# Patient Record
Sex: Female | Born: 1965
Health system: Southern US, Community
[De-identification: ages and names within clinical notes are randomized; demographics above are authoritative.]

## PROBLEM LIST (undated history)

## (undated) DIAGNOSIS — M47816 Spondylosis without myelopathy or radiculopathy, lumbar region: Secondary | ICD-10-CM

## (undated) DIAGNOSIS — N979 Female infertility, unspecified: Secondary | ICD-10-CM

## (undated) DIAGNOSIS — M51369 Other intervertebral disc degeneration, lumbar region without mention of lumbar back pain or lower extremity pain: Secondary | ICD-10-CM

## (undated) DIAGNOSIS — H269 Unspecified cataract: Secondary | ICD-10-CM

## (undated) DIAGNOSIS — D649 Anemia, unspecified: Secondary | ICD-10-CM

## (undated) DIAGNOSIS — R1310 Dysphagia, unspecified: Secondary | ICD-10-CM

## (undated) DIAGNOSIS — F32A Depression, unspecified: Secondary | ICD-10-CM

## (undated) DIAGNOSIS — K5900 Constipation, unspecified: Secondary | ICD-10-CM

## (undated) DIAGNOSIS — E559 Vitamin D deficiency, unspecified: Secondary | ICD-10-CM

## (undated) DIAGNOSIS — M5430 Sciatica, unspecified side: Secondary | ICD-10-CM

## (undated) DIAGNOSIS — R600 Localized edema: Secondary | ICD-10-CM

## (undated) DIAGNOSIS — M3500 Sicca syndrome, unspecified: Secondary | ICD-10-CM

## (undated) DIAGNOSIS — R0602 Shortness of breath: Secondary | ICD-10-CM

## (undated) DIAGNOSIS — E785 Hyperlipidemia, unspecified: Secondary | ICD-10-CM

## (undated) DIAGNOSIS — E039 Hypothyroidism, unspecified: Secondary | ICD-10-CM

## (undated) DIAGNOSIS — M810 Age-related osteoporosis without current pathological fracture: Secondary | ICD-10-CM

## (undated) DIAGNOSIS — K760 Fatty (change of) liver, not elsewhere classified: Secondary | ICD-10-CM

## (undated) DIAGNOSIS — Z8052 Family history of malignant neoplasm of bladder: Secondary | ICD-10-CM

## (undated) DIAGNOSIS — M797 Fibromyalgia: Secondary | ICD-10-CM

## (undated) DIAGNOSIS — I10 Essential (primary) hypertension: Secondary | ICD-10-CM

## (undated) DIAGNOSIS — F329 Major depressive disorder, single episode, unspecified: Secondary | ICD-10-CM

## (undated) DIAGNOSIS — E739 Lactose intolerance, unspecified: Secondary | ICD-10-CM

## (undated) DIAGNOSIS — R079 Chest pain, unspecified: Secondary | ICD-10-CM

## (undated) DIAGNOSIS — G629 Polyneuropathy, unspecified: Secondary | ICD-10-CM

## (undated) DIAGNOSIS — M1990 Unspecified osteoarthritis, unspecified site: Secondary | ICD-10-CM

## (undated) DIAGNOSIS — G8929 Other chronic pain: Secondary | ICD-10-CM

## (undated) DIAGNOSIS — M549 Dorsalgia, unspecified: Secondary | ICD-10-CM

## (undated) DIAGNOSIS — K829 Disease of gallbladder, unspecified: Secondary | ICD-10-CM

## (undated) DIAGNOSIS — G709 Myoneural disorder, unspecified: Secondary | ICD-10-CM

## (undated) DIAGNOSIS — K589 Irritable bowel syndrome without diarrhea: Secondary | ICD-10-CM

## (undated) DIAGNOSIS — G9332 Myalgic encephalomyelitis/chronic fatigue syndrome: Secondary | ICD-10-CM

## (undated) DIAGNOSIS — F419 Anxiety disorder, unspecified: Secondary | ICD-10-CM

## (undated) DIAGNOSIS — M2550 Pain in unspecified joint: Secondary | ICD-10-CM

## (undated) DIAGNOSIS — R7303 Prediabetes: Secondary | ICD-10-CM

## (undated) DIAGNOSIS — J45909 Unspecified asthma, uncomplicated: Secondary | ICD-10-CM

## (undated) DIAGNOSIS — M5136 Other intervertebral disc degeneration, lumbar region: Secondary | ICD-10-CM

## (undated) DIAGNOSIS — E079 Disorder of thyroid, unspecified: Secondary | ICD-10-CM

## (undated) DIAGNOSIS — T7840XA Allergy, unspecified, initial encounter: Secondary | ICD-10-CM

## (undated) DIAGNOSIS — G459 Transient cerebral ischemic attack, unspecified: Secondary | ICD-10-CM

## (undated) DIAGNOSIS — K224 Dyskinesia of esophagus: Secondary | ICD-10-CM

## (undated) HISTORY — DX: Disorder of thyroid, unspecified: E07.9

## (undated) HISTORY — DX: Essential (primary) hypertension: I10

## (undated) HISTORY — DX: Myoneural disorder, unspecified: G70.9

## (undated) HISTORY — DX: Unspecified osteoarthritis, unspecified site: M19.90

## (undated) HISTORY — DX: Depression, unspecified: F32.A

## (undated) HISTORY — DX: Prediabetes: R73.03

## (undated) HISTORY — PX: NECK SURGERY: SHX720

## (undated) HISTORY — DX: Hyperlipidemia, unspecified: E78.5

## (undated) HISTORY — DX: Sciatica, unspecified side: M54.30

## (undated) HISTORY — DX: Family history of malignant neoplasm of bladder: Z80.52

## (undated) HISTORY — DX: Polyneuropathy, unspecified: G62.9

## (undated) HISTORY — DX: Transient cerebral ischemic attack, unspecified: G45.9

## (undated) HISTORY — DX: Other intervertebral disc degeneration, lumbar region without mention of lumbar back pain or lower extremity pain: M51.369

## (undated) HISTORY — DX: Allergy, unspecified, initial encounter: T78.40XA

## (undated) HISTORY — DX: Hypothyroidism, unspecified: E03.9

## (undated) HISTORY — DX: Pain in unspecified joint: M25.50

## (undated) HISTORY — DX: Fibromyalgia: M79.7

## (undated) HISTORY — PX: APPENDECTOMY: SHX54

## (undated) HISTORY — DX: Anxiety disorder, unspecified: F41.9

## (undated) HISTORY — DX: Disease of gallbladder, unspecified: K82.9

## (undated) HISTORY — DX: Unspecified asthma, uncomplicated: J45.909

## (undated) HISTORY — DX: Unspecified cataract: H26.9

## (undated) HISTORY — DX: Vitamin D deficiency, unspecified: E55.9

## (undated) HISTORY — DX: Other intervertebral disc degeneration, lumbar region: M51.36

## (undated) HISTORY — DX: Dysphagia, unspecified: R13.10

## (undated) HISTORY — DX: Other chronic pain: G89.29

## (undated) HISTORY — PX: TUBAL LIGATION: SHX77

## (undated) HISTORY — DX: Dorsalgia, unspecified: M54.9

## (undated) HISTORY — DX: Age-related osteoporosis without current pathological fracture: M81.0

## (undated) HISTORY — DX: Dyskinesia of esophagus: K22.4

## (undated) HISTORY — DX: Anemia, unspecified: D64.9

## (undated) HISTORY — DX: Myalgic encephalomyelitis/chronic fatigue syndrome: G93.32

## (undated) HISTORY — PX: SPINE SURGERY: SHX786

## (undated) HISTORY — PX: CHOLECYSTECTOMY: SHX55

## (undated) HISTORY — DX: Female infertility, unspecified: N97.9

## (undated) HISTORY — DX: Constipation, unspecified: K59.00

## (undated) HISTORY — DX: Fatty (change of) liver, not elsewhere classified: K76.0

## (undated) HISTORY — PX: TOTAL ABDOMINAL HYSTERECTOMY: SHX209

## (undated) HISTORY — DX: Localized edema: R60.0

## (undated) HISTORY — PX: ANKLE SURGERY: SHX546

## (undated) HISTORY — DX: Shortness of breath: R06.02

## (undated) HISTORY — DX: Chest pain, unspecified: R07.9

## (undated) HISTORY — DX: Irritable bowel syndrome, unspecified: K58.9

## (undated) HISTORY — PX: OTHER SURGICAL HISTORY: SHX169

## (undated) HISTORY — PX: ABDOMINAL HYSTERECTOMY: SHX81

## (undated) HISTORY — DX: Spondylosis without myelopathy or radiculopathy, lumbar region: M47.816

## (undated) HISTORY — DX: Lactose intolerance, unspecified: E73.9

---
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
## 2013-07-11 DIAGNOSIS — G629 Polyneuropathy, unspecified: Secondary | ICD-10-CM | POA: Insufficient documentation

## 2015-08-20 DIAGNOSIS — M47817 Spondylosis without myelopathy or radiculopathy, lumbosacral region: Secondary | ICD-10-CM | POA: Insufficient documentation

## 2017-04-01 DIAGNOSIS — M6281 Muscle weakness (generalized): Secondary | ICD-10-CM | POA: Diagnosis not present

## 2017-04-11 DIAGNOSIS — G44229 Chronic tension-type headache, not intractable: Secondary | ICD-10-CM | POA: Diagnosis not present

## 2017-04-11 DIAGNOSIS — I739 Peripheral vascular disease, unspecified: Secondary | ICD-10-CM | POA: Diagnosis not present

## 2017-05-04 DIAGNOSIS — J029 Acute pharyngitis, unspecified: Secondary | ICD-10-CM | POA: Diagnosis not present

## 2017-05-04 DIAGNOSIS — J069 Acute upper respiratory infection, unspecified: Secondary | ICD-10-CM | POA: Diagnosis not present

## 2017-05-25 DIAGNOSIS — M5136 Other intervertebral disc degeneration, lumbar region: Secondary | ICD-10-CM | POA: Diagnosis not present

## 2017-05-25 DIAGNOSIS — M4802 Spinal stenosis, cervical region: Secondary | ICD-10-CM | POA: Diagnosis not present

## 2017-05-25 DIAGNOSIS — Z8249 Family history of ischemic heart disease and other diseases of the circulatory system: Secondary | ICD-10-CM | POA: Diagnosis not present

## 2017-05-25 DIAGNOSIS — M961 Postlaminectomy syndrome, not elsewhere classified: Secondary | ICD-10-CM | POA: Diagnosis not present

## 2017-05-25 DIAGNOSIS — M791 Myalgia: Secondary | ICD-10-CM | POA: Diagnosis not present

## 2017-05-25 DIAGNOSIS — Z79891 Long term (current) use of opiate analgesic: Secondary | ICD-10-CM | POA: Diagnosis not present

## 2017-05-25 DIAGNOSIS — M461 Sacroiliitis, not elsewhere classified: Secondary | ICD-10-CM | POA: Diagnosis not present

## 2017-05-25 DIAGNOSIS — Z885 Allergy status to narcotic agent status: Secondary | ICD-10-CM | POA: Diagnosis not present

## 2017-05-25 DIAGNOSIS — M533 Sacrococcygeal disorders, not elsewhere classified: Secondary | ICD-10-CM | POA: Diagnosis not present

## 2017-05-25 DIAGNOSIS — M47896 Other spondylosis, lumbar region: Secondary | ICD-10-CM | POA: Diagnosis not present

## 2017-05-25 DIAGNOSIS — M5010 Cervical disc disorder with radiculopathy, unspecified cervical region: Secondary | ICD-10-CM | POA: Diagnosis not present

## 2017-05-25 DIAGNOSIS — M47892 Other spondylosis, cervical region: Secondary | ICD-10-CM | POA: Diagnosis not present

## 2017-06-22 DIAGNOSIS — J069 Acute upper respiratory infection, unspecified: Secondary | ICD-10-CM | POA: Diagnosis not present

## 2017-06-22 DIAGNOSIS — R05 Cough: Secondary | ICD-10-CM | POA: Diagnosis not present

## 2017-06-22 DIAGNOSIS — R062 Wheezing: Secondary | ICD-10-CM | POA: Diagnosis not present

## 2017-07-20 DIAGNOSIS — M961 Postlaminectomy syndrome, not elsewhere classified: Secondary | ICD-10-CM | POA: Diagnosis not present

## 2017-07-20 DIAGNOSIS — M7918 Myalgia, other site: Secondary | ICD-10-CM | POA: Diagnosis not present

## 2017-07-20 DIAGNOSIS — M461 Sacroiliitis, not elsewhere classified: Secondary | ICD-10-CM | POA: Diagnosis not present

## 2017-07-20 DIAGNOSIS — M5030 Other cervical disc degeneration, unspecified cervical region: Secondary | ICD-10-CM | POA: Diagnosis not present

## 2017-07-20 DIAGNOSIS — M47816 Spondylosis without myelopathy or radiculopathy, lumbar region: Secondary | ICD-10-CM | POA: Diagnosis not present

## 2017-07-20 DIAGNOSIS — G8929 Other chronic pain: Secondary | ICD-10-CM | POA: Diagnosis not present

## 2017-07-20 DIAGNOSIS — M47812 Spondylosis without myelopathy or radiculopathy, cervical region: Secondary | ICD-10-CM | POA: Diagnosis not present

## 2017-07-20 DIAGNOSIS — M4802 Spinal stenosis, cervical region: Secondary | ICD-10-CM | POA: Diagnosis not present

## 2017-07-20 DIAGNOSIS — M5136 Other intervertebral disc degeneration, lumbar region: Secondary | ICD-10-CM | POA: Diagnosis not present

## 2017-07-20 DIAGNOSIS — Z79891 Long term (current) use of opiate analgesic: Secondary | ICD-10-CM | POA: Diagnosis not present

## 2017-07-20 DIAGNOSIS — Z6841 Body Mass Index (BMI) 40.0 and over, adult: Secondary | ICD-10-CM | POA: Diagnosis not present

## 2017-07-20 DIAGNOSIS — Z981 Arthrodesis status: Secondary | ICD-10-CM | POA: Diagnosis not present

## 2017-07-20 DIAGNOSIS — M533 Sacrococcygeal disorders, not elsewhere classified: Secondary | ICD-10-CM | POA: Diagnosis not present

## 2017-08-16 DIAGNOSIS — M549 Dorsalgia, unspecified: Secondary | ICD-10-CM | POA: Diagnosis not present

## 2017-08-23 DIAGNOSIS — H109 Unspecified conjunctivitis: Secondary | ICD-10-CM | POA: Diagnosis not present

## 2017-08-25 DIAGNOSIS — Z981 Arthrodesis status: Secondary | ICD-10-CM | POA: Diagnosis not present

## 2017-08-25 DIAGNOSIS — M461 Sacroiliitis, not elsewhere classified: Secondary | ICD-10-CM | POA: Diagnosis not present

## 2017-08-25 DIAGNOSIS — Z79899 Other long term (current) drug therapy: Secondary | ICD-10-CM | POA: Diagnosis not present

## 2017-08-25 DIAGNOSIS — M47816 Spondylosis without myelopathy or radiculopathy, lumbar region: Secondary | ICD-10-CM | POA: Diagnosis not present

## 2017-08-25 DIAGNOSIS — Z885 Allergy status to narcotic agent status: Secondary | ICD-10-CM | POA: Diagnosis not present

## 2017-08-25 DIAGNOSIS — M4802 Spinal stenosis, cervical region: Secondary | ICD-10-CM | POA: Diagnosis not present

## 2017-08-25 DIAGNOSIS — M5010 Cervical disc disorder with radiculopathy, unspecified cervical region: Secondary | ICD-10-CM | POA: Diagnosis not present

## 2017-08-25 DIAGNOSIS — M533 Sacrococcygeal disorders, not elsewhere classified: Secondary | ICD-10-CM | POA: Diagnosis not present

## 2017-08-25 DIAGNOSIS — M961 Postlaminectomy syndrome, not elsewhere classified: Secondary | ICD-10-CM | POA: Diagnosis not present

## 2017-09-08 DIAGNOSIS — M797 Fibromyalgia: Secondary | ICD-10-CM | POA: Diagnosis not present

## 2017-09-29 DIAGNOSIS — M533 Sacrococcygeal disorders, not elsewhere classified: Secondary | ICD-10-CM | POA: Diagnosis not present

## 2017-09-29 DIAGNOSIS — M961 Postlaminectomy syndrome, not elsewhere classified: Secondary | ICD-10-CM | POA: Diagnosis not present

## 2017-09-29 DIAGNOSIS — M5136 Other intervertebral disc degeneration, lumbar region: Secondary | ICD-10-CM | POA: Diagnosis not present

## 2017-09-29 DIAGNOSIS — M5030 Other cervical disc degeneration, unspecified cervical region: Secondary | ICD-10-CM | POA: Diagnosis not present

## 2017-09-29 DIAGNOSIS — Z79891 Long term (current) use of opiate analgesic: Secondary | ICD-10-CM | POA: Diagnosis not present

## 2017-09-29 DIAGNOSIS — M47816 Spondylosis without myelopathy or radiculopathy, lumbar region: Secondary | ICD-10-CM | POA: Diagnosis not present

## 2017-09-29 DIAGNOSIS — M461 Sacroiliitis, not elsewhere classified: Secondary | ICD-10-CM | POA: Diagnosis not present

## 2017-09-29 DIAGNOSIS — Z6841 Body Mass Index (BMI) 40.0 and over, adult: Secondary | ICD-10-CM | POA: Diagnosis not present

## 2017-09-29 DIAGNOSIS — M7918 Myalgia, other site: Secondary | ICD-10-CM | POA: Diagnosis not present

## 2017-09-29 DIAGNOSIS — G8929 Other chronic pain: Secondary | ICD-10-CM | POA: Diagnosis not present

## 2017-09-29 DIAGNOSIS — M4802 Spinal stenosis, cervical region: Secondary | ICD-10-CM | POA: Diagnosis not present

## 2017-09-29 DIAGNOSIS — M170 Bilateral primary osteoarthritis of knee: Secondary | ICD-10-CM | POA: Diagnosis not present

## 2017-09-29 DIAGNOSIS — M47812 Spondylosis without myelopathy or radiculopathy, cervical region: Secondary | ICD-10-CM | POA: Diagnosis not present

## 2017-09-29 DIAGNOSIS — Z981 Arthrodesis status: Secondary | ICD-10-CM | POA: Diagnosis not present

## 2017-10-18 DIAGNOSIS — R5383 Other fatigue: Secondary | ICD-10-CM | POA: Diagnosis not present

## 2017-12-08 DIAGNOSIS — G8929 Other chronic pain: Secondary | ICD-10-CM | POA: Diagnosis not present

## 2017-12-08 DIAGNOSIS — M961 Postlaminectomy syndrome, not elsewhere classified: Secondary | ICD-10-CM | POA: Diagnosis not present

## 2017-12-08 DIAGNOSIS — M461 Sacroiliitis, not elsewhere classified: Secondary | ICD-10-CM | POA: Diagnosis not present

## 2017-12-08 DIAGNOSIS — M170 Bilateral primary osteoarthritis of knee: Secondary | ICD-10-CM | POA: Diagnosis not present

## 2017-12-08 DIAGNOSIS — M5136 Other intervertebral disc degeneration, lumbar region: Secondary | ICD-10-CM | POA: Diagnosis not present

## 2017-12-08 DIAGNOSIS — G5710 Meralgia paresthetica, unspecified lower limb: Secondary | ICD-10-CM | POA: Diagnosis not present

## 2017-12-08 DIAGNOSIS — Z79891 Long term (current) use of opiate analgesic: Secondary | ICD-10-CM | POA: Diagnosis not present

## 2017-12-08 DIAGNOSIS — M7918 Myalgia, other site: Secondary | ICD-10-CM | POA: Diagnosis not present

## 2017-12-08 DIAGNOSIS — M5030 Other cervical disc degeneration, unspecified cervical region: Secondary | ICD-10-CM | POA: Diagnosis not present

## 2017-12-08 DIAGNOSIS — M4802 Spinal stenosis, cervical region: Secondary | ICD-10-CM | POA: Diagnosis not present

## 2017-12-08 DIAGNOSIS — M533 Sacrococcygeal disorders, not elsewhere classified: Secondary | ICD-10-CM | POA: Diagnosis not present

## 2017-12-08 DIAGNOSIS — M7912 Myalgia of auxiliary muscles, head and neck: Secondary | ICD-10-CM | POA: Diagnosis not present

## 2017-12-08 DIAGNOSIS — Z981 Arthrodesis status: Secondary | ICD-10-CM | POA: Diagnosis not present

## 2017-12-08 DIAGNOSIS — M47816 Spondylosis without myelopathy or radiculopathy, lumbar region: Secondary | ICD-10-CM | POA: Diagnosis not present

## 2017-12-08 DIAGNOSIS — M47812 Spondylosis without myelopathy or radiculopathy, cervical region: Secondary | ICD-10-CM | POA: Diagnosis not present

## 2017-12-21 DIAGNOSIS — M7061 Trochanteric bursitis, right hip: Secondary | ICD-10-CM | POA: Diagnosis not present

## 2017-12-21 DIAGNOSIS — M5030 Other cervical disc degeneration, unspecified cervical region: Secondary | ICD-10-CM | POA: Diagnosis not present

## 2017-12-21 DIAGNOSIS — Z79891 Long term (current) use of opiate analgesic: Secondary | ICD-10-CM | POA: Diagnosis not present

## 2017-12-21 DIAGNOSIS — Z981 Arthrodesis status: Secondary | ICD-10-CM | POA: Diagnosis not present

## 2017-12-21 DIAGNOSIS — M542 Cervicalgia: Secondary | ICD-10-CM | POA: Diagnosis not present

## 2017-12-21 DIAGNOSIS — Z79899 Other long term (current) drug therapy: Secondary | ICD-10-CM | POA: Diagnosis not present

## 2017-12-21 DIAGNOSIS — G5710 Meralgia paresthetica, unspecified lower limb: Secondary | ICD-10-CM | POA: Diagnosis not present

## 2017-12-21 DIAGNOSIS — G5711 Meralgia paresthetica, right lower limb: Secondary | ICD-10-CM | POA: Diagnosis not present

## 2017-12-21 DIAGNOSIS — M170 Bilateral primary osteoarthritis of knee: Secondary | ICD-10-CM | POA: Diagnosis not present

## 2017-12-21 DIAGNOSIS — M5136 Other intervertebral disc degeneration, lumbar region: Secondary | ICD-10-CM | POA: Diagnosis not present

## 2017-12-21 DIAGNOSIS — Z6841 Body Mass Index (BMI) 40.0 and over, adult: Secondary | ICD-10-CM | POA: Diagnosis not present

## 2017-12-28 DIAGNOSIS — G3184 Mild cognitive impairment, so stated: Secondary | ICD-10-CM | POA: Diagnosis not present

## 2017-12-28 DIAGNOSIS — M79606 Pain in leg, unspecified: Secondary | ICD-10-CM | POA: Diagnosis not present

## 2018-01-02 DIAGNOSIS — E039 Hypothyroidism, unspecified: Secondary | ICD-10-CM | POA: Diagnosis not present

## 2018-01-30 DIAGNOSIS — J111 Influenza due to unidentified influenza virus with other respiratory manifestations: Secondary | ICD-10-CM | POA: Diagnosis not present

## 2018-01-30 DIAGNOSIS — J029 Acute pharyngitis, unspecified: Secondary | ICD-10-CM | POA: Diagnosis not present

## 2018-02-22 DIAGNOSIS — M79651 Pain in right thigh: Secondary | ICD-10-CM | POA: Diagnosis not present

## 2018-02-22 DIAGNOSIS — M792 Neuralgia and neuritis, unspecified: Secondary | ICD-10-CM | POA: Diagnosis not present

## 2018-02-22 DIAGNOSIS — Z79899 Other long term (current) drug therapy: Secondary | ICD-10-CM | POA: Diagnosis not present

## 2018-02-22 DIAGNOSIS — Z79891 Long term (current) use of opiate analgesic: Secondary | ICD-10-CM | POA: Diagnosis not present

## 2018-02-22 DIAGNOSIS — M25561 Pain in right knee: Secondary | ICD-10-CM | POA: Diagnosis not present

## 2018-02-27 DIAGNOSIS — J029 Acute pharyngitis, unspecified: Secondary | ICD-10-CM | POA: Diagnosis not present

## 2018-03-22 DIAGNOSIS — M5010 Cervical disc disorder with radiculopathy, unspecified cervical region: Secondary | ICD-10-CM | POA: Diagnosis not present

## 2018-03-22 DIAGNOSIS — M47816 Spondylosis without myelopathy or radiculopathy, lumbar region: Secondary | ICD-10-CM | POA: Diagnosis not present

## 2018-03-22 DIAGNOSIS — M961 Postlaminectomy syndrome, not elsewhere classified: Secondary | ICD-10-CM | POA: Diagnosis not present

## 2018-03-22 DIAGNOSIS — Z981 Arthrodesis status: Secondary | ICD-10-CM | POA: Diagnosis not present

## 2018-03-22 DIAGNOSIS — M792 Neuralgia and neuritis, unspecified: Secondary | ICD-10-CM | POA: Diagnosis not present

## 2018-03-22 DIAGNOSIS — G5711 Meralgia paresthetica, right lower limb: Secondary | ICD-10-CM | POA: Diagnosis not present

## 2018-03-22 DIAGNOSIS — Z79891 Long term (current) use of opiate analgesic: Secondary | ICD-10-CM | POA: Diagnosis not present

## 2018-03-22 DIAGNOSIS — M461 Sacroiliitis, not elsewhere classified: Secondary | ICD-10-CM | POA: Diagnosis not present

## 2018-03-22 DIAGNOSIS — M4802 Spinal stenosis, cervical region: Secondary | ICD-10-CM | POA: Diagnosis not present

## 2018-03-22 DIAGNOSIS — M533 Sacrococcygeal disorders, not elsewhere classified: Secondary | ICD-10-CM | POA: Diagnosis not present

## 2018-04-03 DIAGNOSIS — R946 Abnormal results of thyroid function studies: Secondary | ICD-10-CM | POA: Diagnosis not present

## 2018-04-03 DIAGNOSIS — E039 Hypothyroidism, unspecified: Secondary | ICD-10-CM | POA: Diagnosis not present

## 2018-04-19 DIAGNOSIS — M792 Neuralgia and neuritis, unspecified: Secondary | ICD-10-CM | POA: Diagnosis not present

## 2018-04-19 DIAGNOSIS — M533 Sacrococcygeal disorders, not elsewhere classified: Secondary | ICD-10-CM | POA: Diagnosis not present

## 2018-04-19 DIAGNOSIS — M542 Cervicalgia: Secondary | ICD-10-CM | POA: Diagnosis not present

## 2018-04-19 DIAGNOSIS — Z79891 Long term (current) use of opiate analgesic: Secondary | ICD-10-CM | POA: Diagnosis not present

## 2018-04-19 DIAGNOSIS — M5030 Other cervical disc degeneration, unspecified cervical region: Secondary | ICD-10-CM | POA: Diagnosis not present

## 2018-04-19 DIAGNOSIS — M47816 Spondylosis without myelopathy or radiculopathy, lumbar region: Secondary | ICD-10-CM | POA: Diagnosis not present

## 2018-04-19 DIAGNOSIS — M4802 Spinal stenosis, cervical region: Secondary | ICD-10-CM | POA: Diagnosis not present

## 2018-04-19 DIAGNOSIS — M5136 Other intervertebral disc degeneration, lumbar region: Secondary | ICD-10-CM | POA: Diagnosis not present

## 2018-04-19 DIAGNOSIS — M961 Postlaminectomy syndrome, not elsewhere classified: Secondary | ICD-10-CM | POA: Diagnosis not present

## 2018-04-19 DIAGNOSIS — G5720 Lesion of femoral nerve, unspecified lower limb: Secondary | ICD-10-CM | POA: Diagnosis not present

## 2018-04-19 DIAGNOSIS — M7918 Myalgia, other site: Secondary | ICD-10-CM | POA: Diagnosis not present

## 2018-04-19 DIAGNOSIS — M47892 Other spondylosis, cervical region: Secondary | ICD-10-CM | POA: Diagnosis not present

## 2018-04-19 DIAGNOSIS — Z5181 Encounter for therapeutic drug level monitoring: Secondary | ICD-10-CM | POA: Diagnosis not present

## 2018-04-19 DIAGNOSIS — M47812 Spondylosis without myelopathy or radiculopathy, cervical region: Secondary | ICD-10-CM | POA: Diagnosis not present

## 2018-04-19 DIAGNOSIS — M461 Sacroiliitis, not elsewhere classified: Secondary | ICD-10-CM | POA: Diagnosis not present

## 2018-04-27 DIAGNOSIS — M79671 Pain in right foot: Secondary | ICD-10-CM | POA: Diagnosis not present

## 2018-05-04 DIAGNOSIS — M1711 Unilateral primary osteoarthritis, right knee: Secondary | ICD-10-CM | POA: Diagnosis not present

## 2018-05-04 DIAGNOSIS — M47816 Spondylosis without myelopathy or radiculopathy, lumbar region: Secondary | ICD-10-CM | POA: Diagnosis not present

## 2018-05-04 DIAGNOSIS — Z79891 Long term (current) use of opiate analgesic: Secondary | ICD-10-CM | POA: Diagnosis not present

## 2018-05-04 DIAGNOSIS — M5030 Other cervical disc degeneration, unspecified cervical region: Secondary | ICD-10-CM | POA: Diagnosis not present

## 2018-05-04 DIAGNOSIS — M47812 Spondylosis without myelopathy or radiculopathy, cervical region: Secondary | ICD-10-CM | POA: Diagnosis not present

## 2018-05-04 DIAGNOSIS — M25561 Pain in right knee: Secondary | ICD-10-CM | POA: Diagnosis not present

## 2018-05-04 DIAGNOSIS — M5136 Other intervertebral disc degeneration, lumbar region: Secondary | ICD-10-CM | POA: Diagnosis not present

## 2018-05-04 DIAGNOSIS — M461 Sacroiliitis, not elsewhere classified: Secondary | ICD-10-CM | POA: Diagnosis not present

## 2018-05-04 DIAGNOSIS — M961 Postlaminectomy syndrome, not elsewhere classified: Secondary | ICD-10-CM | POA: Diagnosis not present

## 2018-05-04 DIAGNOSIS — M542 Cervicalgia: Secondary | ICD-10-CM | POA: Diagnosis not present

## 2018-05-04 DIAGNOSIS — M533 Sacrococcygeal disorders, not elsewhere classified: Secondary | ICD-10-CM | POA: Diagnosis not present

## 2018-05-04 DIAGNOSIS — M5010 Cervical disc disorder with radiculopathy, unspecified cervical region: Secondary | ICD-10-CM | POA: Diagnosis not present

## 2018-05-04 DIAGNOSIS — M4802 Spinal stenosis, cervical region: Secondary | ICD-10-CM | POA: Diagnosis not present

## 2018-05-04 DIAGNOSIS — Z981 Arthrodesis status: Secondary | ICD-10-CM | POA: Diagnosis not present

## 2018-07-03 DIAGNOSIS — R300 Dysuria: Secondary | ICD-10-CM | POA: Diagnosis not present

## 2018-07-03 DIAGNOSIS — J019 Acute sinusitis, unspecified: Secondary | ICD-10-CM | POA: Diagnosis not present

## 2018-07-20 DIAGNOSIS — Z79891 Long term (current) use of opiate analgesic: Secondary | ICD-10-CM | POA: Diagnosis not present

## 2018-07-20 DIAGNOSIS — G5710 Meralgia paresthetica, unspecified lower limb: Secondary | ICD-10-CM | POA: Diagnosis not present

## 2018-07-20 DIAGNOSIS — M5136 Other intervertebral disc degeneration, lumbar region: Secondary | ICD-10-CM | POA: Diagnosis not present

## 2018-07-20 DIAGNOSIS — M47816 Spondylosis without myelopathy or radiculopathy, lumbar region: Secondary | ICD-10-CM | POA: Diagnosis not present

## 2018-07-20 DIAGNOSIS — M5030 Other cervical disc degeneration, unspecified cervical region: Secondary | ICD-10-CM | POA: Diagnosis not present

## 2018-07-20 DIAGNOSIS — M4802 Spinal stenosis, cervical region: Secondary | ICD-10-CM | POA: Diagnosis not present

## 2018-07-20 DIAGNOSIS — M7918 Myalgia, other site: Secondary | ICD-10-CM | POA: Diagnosis not present

## 2018-07-20 DIAGNOSIS — M542 Cervicalgia: Secondary | ICD-10-CM | POA: Diagnosis not present

## 2018-07-20 DIAGNOSIS — G8929 Other chronic pain: Secondary | ICD-10-CM | POA: Diagnosis not present

## 2018-07-20 DIAGNOSIS — M533 Sacrococcygeal disorders, not elsewhere classified: Secondary | ICD-10-CM | POA: Diagnosis not present

## 2018-07-20 DIAGNOSIS — M47812 Spondylosis without myelopathy or radiculopathy, cervical region: Secondary | ICD-10-CM | POA: Diagnosis not present

## 2018-07-20 DIAGNOSIS — M961 Postlaminectomy syndrome, not elsewhere classified: Secondary | ICD-10-CM | POA: Diagnosis not present

## 2018-07-20 DIAGNOSIS — M545 Low back pain: Secondary | ICD-10-CM | POA: Diagnosis not present

## 2018-07-20 DIAGNOSIS — M461 Sacroiliitis, not elsewhere classified: Secondary | ICD-10-CM | POA: Diagnosis not present

## 2018-07-20 DIAGNOSIS — Z981 Arthrodesis status: Secondary | ICD-10-CM | POA: Diagnosis not present

## 2018-09-05 DIAGNOSIS — R6889 Other general symptoms and signs: Secondary | ICD-10-CM | POA: Diagnosis not present

## 2018-09-05 DIAGNOSIS — M797 Fibromyalgia: Secondary | ICD-10-CM | POA: Diagnosis not present

## 2018-09-05 DIAGNOSIS — G3184 Mild cognitive impairment, so stated: Secondary | ICD-10-CM | POA: Diagnosis not present

## 2018-09-14 DIAGNOSIS — M961 Postlaminectomy syndrome, not elsewhere classified: Secondary | ICD-10-CM | POA: Diagnosis not present

## 2018-09-14 DIAGNOSIS — M533 Sacrococcygeal disorders, not elsewhere classified: Secondary | ICD-10-CM | POA: Diagnosis not present

## 2018-09-14 DIAGNOSIS — M7918 Myalgia, other site: Secondary | ICD-10-CM | POA: Diagnosis not present

## 2018-09-14 DIAGNOSIS — M5030 Other cervical disc degeneration, unspecified cervical region: Secondary | ICD-10-CM | POA: Diagnosis not present

## 2018-09-14 DIAGNOSIS — Z79891 Long term (current) use of opiate analgesic: Secondary | ICD-10-CM | POA: Diagnosis not present

## 2018-09-14 DIAGNOSIS — M5136 Other intervertebral disc degeneration, lumbar region: Secondary | ICD-10-CM | POA: Diagnosis not present

## 2018-09-14 DIAGNOSIS — M792 Neuralgia and neuritis, unspecified: Secondary | ICD-10-CM | POA: Diagnosis not present

## 2018-09-14 DIAGNOSIS — Z79899 Other long term (current) drug therapy: Secondary | ICD-10-CM | POA: Diagnosis not present

## 2018-09-14 DIAGNOSIS — M47816 Spondylosis without myelopathy or radiculopathy, lumbar region: Secondary | ICD-10-CM | POA: Diagnosis not present

## 2018-09-14 DIAGNOSIS — M461 Sacroiliitis, not elsewhere classified: Secondary | ICD-10-CM | POA: Diagnosis not present

## 2018-09-14 DIAGNOSIS — M4802 Spinal stenosis, cervical region: Secondary | ICD-10-CM | POA: Diagnosis not present

## 2018-09-14 DIAGNOSIS — M545 Low back pain: Secondary | ICD-10-CM | POA: Diagnosis not present

## 2018-09-14 DIAGNOSIS — M1711 Unilateral primary osteoarthritis, right knee: Secondary | ICD-10-CM | POA: Diagnosis not present

## 2018-09-14 DIAGNOSIS — Z6841 Body Mass Index (BMI) 40.0 and over, adult: Secondary | ICD-10-CM | POA: Diagnosis not present

## 2018-09-14 DIAGNOSIS — M47812 Spondylosis without myelopathy or radiculopathy, cervical region: Secondary | ICD-10-CM | POA: Diagnosis not present

## 2018-09-28 DIAGNOSIS — J019 Acute sinusitis, unspecified: Secondary | ICD-10-CM | POA: Diagnosis not present

## 2018-10-02 DIAGNOSIS — Z79899 Other long term (current) drug therapy: Secondary | ICD-10-CM | POA: Diagnosis not present

## 2018-10-02 DIAGNOSIS — M3501 Sicca syndrome with keratoconjunctivitis: Secondary | ICD-10-CM | POA: Diagnosis not present

## 2018-10-02 DIAGNOSIS — M150 Primary generalized (osteo)arthritis: Secondary | ICD-10-CM | POA: Diagnosis not present

## 2018-10-10 DIAGNOSIS — R531 Weakness: Secondary | ICD-10-CM | POA: Diagnosis not present

## 2018-10-10 DIAGNOSIS — M48062 Spinal stenosis, lumbar region with neurogenic claudication: Secondary | ICD-10-CM | POA: Diagnosis not present

## 2018-10-18 NOTE — Progress Notes (Signed)
Subjective:    Patient ID: Pamela Esparza, female    DOB: 19-Jun-1966, 53 y.o.   MRN: [id number]  HPI:  Pamela Esparza is here to establish as a new pt.  She is a pleasant 53 year old female. PMH:DDD, Fibromyalgia, Obesity, Chronic Pain, Memory Deficit, Depression/Anxiety, Chronic HAs, Hypothyroidism  She suffered back injury in 1988  She if followed by [facility], every 2 months- Oxycodone 10mg  BID She reports acute anxiety treated with meditation and essential oils and PRN SL Nitro- lat dose "years ago" Dr. [employer]- treating memory, generalized weakness, HA She reports ambulating safely with cane She denies regular exercise, declined referral PT She is interested in "Aquatic Therapy" She is married with adult-age child at home She denies tobacco/vape/ETOH use She reports stable mood   Patient Care Team    Relationship Specialty Notifications Start End  [place], Berna Spare, NP PCP - General Family Medicine  10/23/18     Patient Active Problem List   Diagnosis Date Noted  . Healthcare maintenance 10/23/2018  . Chronic joint pain 10/23/2018  . Breast cancer screening 10/23/2018  . DDD (degenerative disc disease), lumbar 10/23/2018  . Muscle pain, fibromyalgia 10/23/2018     Past Medical History:  Diagnosis Date  . Arthritis   . Fibromyalgia   . Joint pain      Past Surgical History:  Procedure Laterality Date  . ABDOMINAL HYSTERECTOMY    . APPENDECTOMY    . CHOLECYSTECTOMY    . finger sugery       Family History  Problem Relation Age of Onset  . Cancer Mother   . Heart attack Father   . Hypertension Father   . Diabetes Father   . Birth defects Maternal Grandmother        ovarian  . Depression Maternal Grandmother   . Birth defects Paternal Grandmother        brain     Social History   Substance and Sexual Activity  Drug Use Never     Social History   Substance and Sexual Activity  Alcohol Use Yes   Comment: 4  drinks a year     Social History   Tobacco Use  Smoking Status Never Smoker  Smokeless Tobacco Never Used     Outpatient Encounter Medications as of 10/23/2018  Medication Sig  . ALPRAZolam (XANAX) 0.5 MG tablet Take 0.5 mg by mouth daily as needed.  . baclofen (LIORESAL) 10 MG tablet Take 10 mg by mouth 3 (three) times daily as needed.  . Cholecalciferol (VITAMIN D3) 75 MCG (3000 UT) TABS Take 2,000 Int'l Units by mouth daily.  . diclofenac (VOLTAREN) 75 MG EC tablet Take 75 mg by mouth.  . DULoxetine (CYMBALTA) 60 MG capsule Take 120 mg by mouth daily.  Marland Kitchen estrogens, conjugated, (PREMARIN) 0.45 MG tablet Take by mouth.  . fluticasone (FLONASE) 50 MCG/ACT nasal spray Place into the nose.  . gabapentin (NEURONTIN) 600 MG tablet Take 600 mg by mouth 3 (three) times daily.  Marland Kitchen levothyroxine (SYNTHROID, LEVOTHROID) 50 MCG tablet Take 50 mcg by mouth daily.  . meloxicam (MOBIC) 15 MG tablet Take 15 mg by mouth.  . neomycin-polymyxin-hydrocortisone (CORTISPORIN) 3.5-10000-1 OTIC suspension   . nitroGLYCERIN (NITROSTAT) 0.4 MG SL tablet Place under the tongue.  . nortriptyline (PAMELOR) 10 MG capsule Take 10 mg by mouth daily.  Marland Kitchen oxyCODONE-acetaminophen (PERCOCET) 10-325 MG tablet Take 1 tablet by mouth 2 (two) times daily.  . pilocarpine (SALAGEN) 5 MG tablet  Take 5 mg by mouth 3 (three) times daily.  . [DISCONTINUED] meloxicam (MOBIC) 15 MG tablet Take 15 mg by mouth daily.   No facility-administered encounter medications on file as of 10/23/2018.     Allergies: Aspirin; Hydrocodone-acetaminophen; Lyrica [pregabalin]; and Topiramate  There is no height or weight on file to calculate BMI.  Blood pressure (!) 142/83, pulse 72, weight 278 lb (126.1 kg).  Review of Systems  Constitutional: Positive for activity change and fatigue. Negative for appetite change, chills, diaphoresis, fever and unexpected weight change.  HENT: Negative for congestion.   Eyes: Negative for visual  disturbance.  Respiratory: Negative for cough, chest tightness, shortness of breath, wheezing and stridor.   Cardiovascular: Negative for chest pain, palpitations and leg swelling.  Gastrointestinal: Positive for constipation. Negative for abdominal distention, anal bleeding, blood in stool, diarrhea, nausea and vomiting.  Endocrine: Negative for cold intolerance, heat intolerance, polydipsia, polyphagia and polyuria.  Genitourinary: Negative for difficulty urinating and flank pain.  Musculoskeletal: Positive for arthralgias, back pain, gait problem, joint swelling, myalgias, neck pain and neck stiffness.  Skin: Negative for color change, pallor, rash and wound.  Neurological: Positive for weakness and headaches. Negative for dizziness.  Hematological: Does not bruise/bleed easily.  Psychiatric/Behavioral: Positive for dysphoric mood and sleep disturbance. Negative for agitation, behavioral problems, confusion, decreased concentration, hallucinations, self-injury and suicidal ideas. The patient is nervous/anxious. The patient is not hyperactive.        Objective:   Physical Exam Vitals signs and nursing note reviewed.  Constitutional:      General: She is not in acute distress.    Appearance: She is obese. She is not ill-appearing, toxic-appearing or diaphoretic.  HENT:     Head: Normocephalic and atraumatic.     Nose: Nose normal.  Eyes:     Extraocular Movements: Extraocular movements intact.     Conjunctiva/sclera: Conjunctivae normal.     Pupils: Pupils are equal, round, and reactive to light.  Cardiovascular:     Rate and Rhythm: Normal rate.     Pulses: Normal pulses.     Heart sounds: No murmur. No friction rub. No gallop.   Pulmonary:     Effort: Pulmonary effort is normal. No respiratory distress.     Breath sounds: Normal breath sounds. No stridor. No wheezing, rhonchi or rales.  Chest:     Chest wall: No tenderness.  Skin:    General: Skin is warm and dry.      Capillary Refill: Capillary refill takes less than 2 seconds.  Neurological:     Mental Status: She is alert and oriented to person, place, and time.  Psychiatric:        Mood and Affect: Mood normal.        Behavior: Behavior normal.        Thought Content: Thought content normal.        Judgment: Judgment normal.       Assessment & Plan:   1. Breast cancer screening   2. Chronic joint pain   3. Healthcare maintenance   4. DDD (degenerative disc disease), lumbar   5. Muscle pain, fibromyalgia     Healthcare maintenance Continue all medications as directed. Continue to drink plenty of water and follow Mediterranean Diet. Remain as active as possible. Local Rheumatology referral placed. Continue with Pain Clinic and Neurologist as directed. Mammogram ordered. Please schedule complete physical in 3 months, please schedule fasting lab appt the week prior.  Chronic joint pain Rheumatology referral placed  Breast cancer screening Mammogram ordered  Muscle pain, fibromyalgia Followed by Pain Clinic Currently on Duloxetine 60mg  BID, Oxycodone 10mg  BID   FOLLOW-UP:  No follow-ups on file.

## 2018-10-23 ENCOUNTER — Ambulatory Visit (INDEPENDENT_AMBULATORY_CARE_PROVIDER_SITE_OTHER): Payer: Federal, State, Local not specified - PPO | Admitting: Adult Health

## 2018-10-23 ENCOUNTER — Encounter: Payer: Self-pay | Admitting: Adult Health

## 2018-10-23 VITALS — BP 142/83 | HR 72 | Wt 278.0 lb

## 2018-10-23 DIAGNOSIS — M5136 Other intervertebral disc degeneration, lumbar region: Secondary | ICD-10-CM

## 2018-10-23 DIAGNOSIS — Z0000 Encounter for general adult medical examination without abnormal findings: Secondary | ICD-10-CM | POA: Insufficient documentation

## 2018-10-23 DIAGNOSIS — Z1239 Encounter for other screening for malignant neoplasm of breast: Secondary | ICD-10-CM | POA: Diagnosis not present

## 2018-10-23 DIAGNOSIS — M2550 Pain in unspecified joint: Secondary | ICD-10-CM

## 2018-10-23 DIAGNOSIS — M797 Fibromyalgia: Secondary | ICD-10-CM

## 2018-10-23 DIAGNOSIS — G8929 Other chronic pain: Secondary | ICD-10-CM

## 2018-10-23 NOTE — Assessment & Plan Note (Signed)
Continue all medications as directed. Continue to drink plenty of water and follow Mediterranean Diet. Remain as active as possible. Local Rheumatology referral placed. Continue with Pain Clinic and Neurologist as directed. Mammogram ordered. Please schedule complete physical in 3 months, please schedule fasting lab appt the week prior.

## 2018-10-23 NOTE — Assessment & Plan Note (Signed)
Rheumatology referral placed

## 2018-10-23 NOTE — Patient Instructions (Signed)
Mediterranean Diet A Mediterranean diet refers to food and lifestyle choices that are based on the traditions of countries located on the [employer]. This way of eating has been shown to help prevent certain conditions and improve outcomes for people who have chronic diseases, like kidney disease and heart disease. What are tips for following this plan? Lifestyle  Cook and eat meals together with your family, when possible.  Drink enough fluid to keep your urine clear or pale yellow.  Be physically active every day. This includes: ? Aerobic exercise like running or swimming. ? Leisure activities like gardening, walking, or housework.  Get 7-8 hours of sleep each night.  If recommended by your health care provider, drink red wine in moderation. This means 1 glass a day for nonpregnant women and 2 glasses a day for men. A glass of wine equals 5 oz (150 mL). Reading food labels   Check the serving size of packaged foods. For foods such as rice and pasta, the serving size refers to the amount of cooked product, not dry.  Check the total fat in packaged foods. Avoid foods that have saturated fat or trans fats.  Check the ingredients list for added sugars, such as corn syrup. Shopping  At the grocery store, buy most of your food from the areas near the walls of the store. This includes: ? Fresh fruits and vegetables (produce). ? Grains, beans, nuts, and seeds. Some of these may be available in unpackaged forms or large amounts (in bulk). ? Fresh seafood. ? Poultry and eggs. ? Low-fat dairy products.  Buy whole ingredients instead of prepackaged foods.  Buy fresh fruits and vegetables in-season from local farmers markets.  Buy frozen fruits and vegetables in resealable bags.  If you do not have access to quality fresh seafood, buy precooked frozen shrimp or canned fish, such as tuna, salmon, or sardines.  Buy small amounts of raw or cooked vegetables, salads, or olives from  the deli or salad bar at your store.  Stock your pantry so you always have certain foods on hand, such as olive oil, canned tuna, canned tomatoes, rice, pasta, and beans. Cooking  Cook foods with extra-virgin olive oil instead of using butter or other vegetable oils.  Have meat as a side dish, and have vegetables or grains as your main dish. This means having meat in small portions or adding small amounts of meat to foods like pasta or stew.  Use beans or vegetables instead of meat in common dishes like chili or lasagna.  Experiment with different cooking methods. Try roasting or broiling vegetables instead of steaming or sauteing them.  Add frozen vegetables to soups, stews, pasta, or rice.  Add nuts or seeds for added healthy fat at each meal. You can add these to yogurt, salads, or vegetable dishes.  Marinate fish or vegetables using olive oil, lemon juice, garlic, and fresh herbs. Meal planning   Plan to eat 1 vegetarian meal one day each week. Try to work up to 2 vegetarian meals, if possible.  Eat seafood 2 or more times a week.  Have healthy snacks readily available, such as: ? Vegetable sticks with hummus. ? [place] yogurt. ? Fruit and nut trail mix.  Eat balanced meals throughout the week. This includes: ? Fruit: 2-3 servings a day ? Vegetables: 4-5 servings a day ? Low-fat dairy: 2 servings a day ? Fish, poultry, or lean meat: 1 serving a day ? Beans and legumes: 2 or more servings a week ?  Nuts and seeds: 1-2 servings a day ? Whole grains: 6-8 servings a day ? Extra-virgin olive oil: 3-4 servings a day  Limit red meat and sweets to only a few servings a month What are my food choices?  Mediterranean diet ? Recommended ? Grains: Whole-grain pasta. Brown rice. Bulgar wheat. Polenta. Couscous. Whole-wheat bread. [place]. ? Vegetables: Artichokes. Beets. Broccoli. Cabbage. Carrots. Eggplant. Green beans. Chard. Kale. Spinach. Onions. Leeks. Peas. Squash.  Tomatoes. Peppers. Radishes. ? Fruits: Apples. Apricots. Avocado. Berries. Bananas. Cherries. Dates. Figs. Grapes. Lemons. Melon. Oranges. Peaches. Plums. Pomegranate. ? Meats and other protein foods: Beans. Almonds. Sunflower seeds. Pine nuts. Peanuts. [place]. Salmon. Scallops. Shrimp. [place]. Tilapia. Clams. Oysters. Eggs. ? Dairy: Low-fat milk. Cheese. Greek yogurt. ? Beverages: Water. Red wine. Herbal tea. ? Fats and oils: Extra virgin olive oil. Avocado oil. Grape seed oil. ? Sweets and desserts: [place] yogurt with honey. Baked apples. Poached pears. Trail mix. ? Seasoning and other foods: Basil. Cilantro. Coriander. Cumin. Mint. Parsley. Sage. Rosemary. Tarragon. Garlic. Oregano. Thyme. Pepper. Balsalmic vinegar. Tahini. Hummus. Tomato sauce. Olives. Mushrooms. ? Limit these ? Grains: Prepackaged pasta or rice dishes. Prepackaged cereal with added sugar. ? Vegetables: Deep fried potatoes (french fries). ? Fruits: Fruit canned in syrup. ? Meats and other protein foods: Beef. Pork. Lamb. Poultry with skin. Hot dogs. [place] Salines. ? Dairy: Ice cream. Sour cream. Whole milk. ? Beverages: Juice. Sugar-sweetened soft drinks. Beer. Liquor and spirits. ? Fats and oils: Butter. Canola oil. Vegetable oil. Beef fat (tallow). Lard. ? Sweets and desserts: Cookies. Cakes. Pies. Candy. ? Seasoning and other foods: Mayonnaise. Premade sauces and marinades. ? The items listed may not be a complete list. Talk with your dietitian about what dietary choices are right for you. Summary  The Mediterranean diet includes both food and lifestyle choices.  Eat a variety of fresh fruits and vegetables, beans, nuts, seeds, and whole grains.  Limit the amount of red meat and sweets that you eat.  Talk with your health care provider about whether it is safe for you to drink red wine in moderation. This means 1 glass a day for nonpregnant women and 2 glasses a day for men. A glass of wine equals 5 oz (150 mL). This information  is not intended to replace advice given to you by your health care provider. Make sure you discuss any questions you have with your health care provider. Document Released: 05/06/2016 Document Revised: 06/08/2016 Document Reviewed: 05/06/2016 Elsevier Interactive Patient Education  2019 [place] all medications as directed. Continue to drink plenty of water and follow Mediterranean Diet. Remain as active as possible. Local Rheumatology referral placed. Continue with Pain Clinic and Neurologist as directed. Mammogram ordered. Please schedule complete physical in 3 months, please schedule fasting lab appt the week prior. WELCOME TO THE PRACTICE!

## 2018-10-23 NOTE — Assessment & Plan Note (Signed)
Mammogram ordered

## 2018-10-23 NOTE — Assessment & Plan Note (Signed)
Followed by Pain Clinic Currently on Duloxetine 60mg  BID, Oxycodone 10mg  BID

## 2018-11-01 DIAGNOSIS — M5126 Other intervertebral disc displacement, lumbar region: Secondary | ICD-10-CM | POA: Diagnosis not present

## 2018-11-01 DIAGNOSIS — M47817 Spondylosis without myelopathy or radiculopathy, lumbosacral region: Secondary | ICD-10-CM | POA: Diagnosis not present

## 2018-11-01 DIAGNOSIS — M48061 Spinal stenosis, lumbar region without neurogenic claudication: Secondary | ICD-10-CM | POA: Diagnosis not present

## 2018-11-01 DIAGNOSIS — M5127 Other intervertebral disc displacement, lumbosacral region: Secondary | ICD-10-CM | POA: Diagnosis not present

## 2018-11-02 DIAGNOSIS — M7918 Myalgia, other site: Secondary | ICD-10-CM | POA: Diagnosis not present

## 2018-11-02 DIAGNOSIS — M170 Bilateral primary osteoarthritis of knee: Secondary | ICD-10-CM | POA: Diagnosis not present

## 2018-11-02 DIAGNOSIS — M461 Sacroiliitis, not elsewhere classified: Secondary | ICD-10-CM | POA: Diagnosis not present

## 2018-11-02 DIAGNOSIS — M47812 Spondylosis without myelopathy or radiculopathy, cervical region: Secondary | ICD-10-CM | POA: Diagnosis not present

## 2018-11-02 DIAGNOSIS — Z79891 Long term (current) use of opiate analgesic: Secondary | ICD-10-CM | POA: Diagnosis not present

## 2018-11-02 DIAGNOSIS — M5136 Other intervertebral disc degeneration, lumbar region: Secondary | ICD-10-CM | POA: Diagnosis not present

## 2018-11-02 DIAGNOSIS — G8929 Other chronic pain: Secondary | ICD-10-CM | POA: Diagnosis not present

## 2018-11-02 DIAGNOSIS — M533 Sacrococcygeal disorders, not elsewhere classified: Secondary | ICD-10-CM | POA: Diagnosis not present

## 2018-11-02 DIAGNOSIS — M47816 Spondylosis without myelopathy or radiculopathy, lumbar region: Secondary | ICD-10-CM | POA: Diagnosis not present

## 2018-11-02 DIAGNOSIS — M25561 Pain in right knee: Secondary | ICD-10-CM | POA: Diagnosis not present

## 2018-11-02 DIAGNOSIS — Z791 Long term (current) use of non-steroidal anti-inflammatories (NSAID): Secondary | ICD-10-CM | POA: Diagnosis not present

## 2018-11-02 DIAGNOSIS — Z981 Arthrodesis status: Secondary | ICD-10-CM | POA: Diagnosis not present

## 2018-11-02 DIAGNOSIS — M792 Neuralgia and neuritis, unspecified: Secondary | ICD-10-CM | POA: Diagnosis not present

## 2018-11-02 DIAGNOSIS — M961 Postlaminectomy syndrome, not elsewhere classified: Secondary | ICD-10-CM | POA: Diagnosis not present

## 2018-11-02 DIAGNOSIS — M4802 Spinal stenosis, cervical region: Secondary | ICD-10-CM | POA: Diagnosis not present

## 2018-11-06 DIAGNOSIS — M7989 Other specified soft tissue disorders: Secondary | ICD-10-CM | POA: Diagnosis not present

## 2018-11-06 DIAGNOSIS — Z6841 Body Mass Index (BMI) 40.0 and over, adult: Secondary | ICD-10-CM | POA: Diagnosis not present

## 2018-11-06 DIAGNOSIS — M3500 Sicca syndrome, unspecified: Secondary | ICD-10-CM | POA: Diagnosis not present

## 2018-12-20 ENCOUNTER — Other Ambulatory Visit: Payer: Self-pay | Admitting: Adult Health

## 2018-12-20 ENCOUNTER — Telehealth (INDEPENDENT_AMBULATORY_CARE_PROVIDER_SITE_OTHER): Payer: Federal, State, Local not specified - PPO | Admitting: Adult Health

## 2018-12-20 ENCOUNTER — Other Ambulatory Visit: Payer: Self-pay

## 2018-12-20 DIAGNOSIS — R05 Cough: Secondary | ICD-10-CM

## 2018-12-20 DIAGNOSIS — R059 Cough, unspecified: Secondary | ICD-10-CM

## 2018-12-20 MED ORDER — PREDNISONE 20 MG PO TABS: ORAL_TABLET | ORAL | 0 refills | Status: DC

## 2018-12-20 MED ORDER — FLUTICASONE PROPIONATE 50 MCG/ACT NA SUSP: 1.0000 | Freq: Every day | NASAL | 1 refills | Status: DC

## 2018-12-20 NOTE — Assessment & Plan Note (Signed)
VSS Current temp 98.15f- without anti-pyretics Since sx's present for 3 days- Increase fluids, rest Use flonase and Prednisone taper If not improved by Monday, 12/25/2018- call clinic and will send in Azithromycin

## 2018-12-20 NOTE — Progress Notes (Signed)
Virtual Visit via Telephone Note  I connected with Pamela Esparza on 12/20/18 at  1:15 PM EDT by telephone and verified that I am speaking with the correct person using two identifiers.   I discussed the limitations, risks, security and privacy concerns of performing an evaluation and management service by telephone and the availability of in person appointments.The clerical staff discussed with the patient that there may be a patient responsible charge related to this service. The patient expressed understanding and agreed to proceed.   History of Present Illness: Pamela Esparza calls in today and reports clear nasal drainage, non-productive cough, frontal HA (7/10), low grade fever (highest 149f oral) and "very slight shortness of breath" that started 3 days ago. She has hx of asthma, seasonal allergies, and re-current bronchitis and states "that I get about this time each year". She has been using OTC Claritan D and Flonase She denies tobacco/vape use She reports current BP- 129/82, HR 75 Current temp 98.67f oral She has not used any OTC Acetaminophen She has only twice travelled to local grocery store in last 3 weeks She denies known exposure to COVID-19   Observations/Objective: No acute distress noted during phone conversation  Assessment and Plan: VSS Current temp 98.35f- without anti-pyretics Since sx's present for 3 days- Increase fluids, rest Use flonase and Prednisone taper If not improved by Monday, 12/25/2018- call clinic and will send in Azithromycin  Follow Up Instructions: If not improved by Monday, 12/25/2018- call clinic and will send in Azithromycin   I discussed the assessment and treatment plan with the patient. The patient was provided an opportunity to ask questions and all were answered. The patient agreed with the plan and demonstrated an understanding of the instructions.   The patient was advised to call back or seek an in-person evaluation if the symptoms worsen  or if the condition fails to improve as anticipated.  I provided 10 minutes of non-face-to-face time during this encounter.   Esaw Grandchild, NP

## 2019-01-16 ENCOUNTER — Other Ambulatory Visit: Payer: Medicare Other

## 2019-01-18 DIAGNOSIS — Z79899 Other long term (current) drug therapy: Secondary | ICD-10-CM | POA: Diagnosis not present

## 2019-01-18 DIAGNOSIS — M545 Low back pain: Secondary | ICD-10-CM | POA: Diagnosis not present

## 2019-01-18 DIAGNOSIS — M5136 Other intervertebral disc degeneration, lumbar region: Secondary | ICD-10-CM | POA: Diagnosis not present

## 2019-01-18 DIAGNOSIS — M7918 Myalgia, other site: Secondary | ICD-10-CM | POA: Diagnosis not present

## 2019-01-18 DIAGNOSIS — M461 Sacroiliitis, not elsewhere classified: Secondary | ICD-10-CM | POA: Diagnosis not present

## 2019-01-18 DIAGNOSIS — Z79891 Long term (current) use of opiate analgesic: Secondary | ICD-10-CM | POA: Diagnosis not present

## 2019-01-18 DIAGNOSIS — M961 Postlaminectomy syndrome, not elsewhere classified: Secondary | ICD-10-CM | POA: Diagnosis not present

## 2019-01-18 DIAGNOSIS — M47812 Spondylosis without myelopathy or radiculopathy, cervical region: Secondary | ICD-10-CM | POA: Diagnosis not present

## 2019-01-18 DIAGNOSIS — M4802 Spinal stenosis, cervical region: Secondary | ICD-10-CM | POA: Diagnosis not present

## 2019-01-18 DIAGNOSIS — Z6841 Body Mass Index (BMI) 40.0 and over, adult: Secondary | ICD-10-CM | POA: Diagnosis not present

## 2019-01-18 DIAGNOSIS — M47816 Spondylosis without myelopathy or radiculopathy, lumbar region: Secondary | ICD-10-CM | POA: Diagnosis not present

## 2019-01-18 DIAGNOSIS — M5030 Other cervical disc degeneration, unspecified cervical region: Secondary | ICD-10-CM | POA: Diagnosis not present

## 2019-01-18 DIAGNOSIS — M533 Sacrococcygeal disorders, not elsewhere classified: Secondary | ICD-10-CM | POA: Diagnosis not present

## 2019-01-18 DIAGNOSIS — M792 Neuralgia and neuritis, unspecified: Secondary | ICD-10-CM | POA: Diagnosis not present

## 2019-01-22 ENCOUNTER — Encounter: Payer: Medicare Other | Admitting: Adult Health

## 2019-01-23 ENCOUNTER — Ambulatory Visit (INDEPENDENT_AMBULATORY_CARE_PROVIDER_SITE_OTHER): Payer: Federal, State, Local not specified - PPO | Admitting: Adult Health

## 2019-01-23 ENCOUNTER — Other Ambulatory Visit: Payer: Self-pay

## 2019-01-23 ENCOUNTER — Encounter: Payer: Self-pay | Admitting: Adult Health

## 2019-01-23 VITALS — BP 123/86 | HR 88 | Temp 98.5°F | Wt 275.3 lb

## 2019-01-23 DIAGNOSIS — J029 Acute pharyngitis, unspecified: Secondary | ICD-10-CM | POA: Diagnosis not present

## 2019-01-23 DIAGNOSIS — H6690 Otitis media, unspecified, unspecified ear: Secondary | ICD-10-CM | POA: Diagnosis not present

## 2019-01-23 LAB — POCT RAPID STREP A (OFFICE): Rapid Strep A Screen: NEGATIVE

## 2019-01-23 MED ORDER — FLUCONAZOLE 150 MG PO TABS: 150.0000 mg | ORAL_TABLET | Freq: Once | ORAL | 1 refills | Status: AC

## 2019-01-23 MED ORDER — AMOXICILLIN-POT CLAVULANATE 875-125 MG PO TABS: 1.0000 | ORAL_TABLET | Freq: Two times a day (BID) | ORAL | 0 refills | Status: DC

## 2019-01-23 NOTE — Assessment & Plan Note (Signed)
Strep Test Negative Please take Augmentin as directed, Diflucan as needed- may repeat dose in one week if needed. Continue to remain well hydrated. Use OTC Acetaminophen as needed for discomfort. If symptoms persist after antibiotic completed please call clinic. If you notice any signs/symptoms of COVID-19 (ie; fever, chills, cough, new or worsening shortness of breath- please call clinic.

## 2019-01-23 NOTE — Patient Instructions (Addendum)
Pharyngitis  Pharyngitis is redness, pain, and swelling (inflammation) of the throat (pharynx). It is a very common cause of sore throat. Pharyngitis can be caused by a bacteria, but it is usually caused by a virus. Most cases of pharyngitis get better on their own without treatment. What are the causes? This condition may be caused by:  Infection by viruses (viral). Viral pharyngitis spreads from person to person (is contagious) through coughing, sneezing, and sharing of personal items or utensils such as cups, forks, spoons, and toothbrushes.  Infection by bacteria (bacterial). Bacterial pharyngitis may be spread by touching the nose or face after coming in contact with the bacteria, or through more intimate contact, such as kissing.  Allergies. Allergies can cause buildup of mucus in the throat (post-nasal drip), leading to inflammation and irritation. Allergies can also cause blocked nasal passages, forcing breathing through the mouth, which dries and irritates the throat. What increases the risk? You are more likely to develop this condition if:  You are 5-24 years old.  You are exposed to crowded environments such as daycare, school, or dormitory living.  You live in a cold climate.  You have a weakened disease-fighting (immune) system. What are the signs or symptoms? Symptoms of this condition vary by the cause (viral, bacterial, or allergies) and can include:  Sore throat.  Fatigue.  Low-grade fever.  Headache.  Joint pain and muscle aches.  Skin rashes.  Swollen glands in the throat (lymph nodes).  Plaque-like film on the throat or tonsils. This is often a symptom of bacterial pharyngitis.  Vomiting.  Stuffy nose (nasal congestion).  Cough.  Red, itchy eyes (conjunctivitis).  Loss of appetite. How is this diagnosed? This condition is often diagnosed based on your medical history and a physical exam. Your health care provider will ask you questions about your  illness and your symptoms. A swab of your throat may be done to check for bacteria (rapid strep test). Other lab tests may also be done, depending on the suspected cause, but these are rare. How is this treated? This condition usually gets better in 3-4 days without medicine. Bacterial pharyngitis may be treated with antibiotic medicines. Follow these instructions at home:  Take over-the-counter and prescription medicines only as told by your health care provider. ? If you were prescribed an antibiotic medicine, take it as told by your health care provider. Do not stop taking the antibiotic even if you start to feel better. ? Do not give children aspirin because of the association with Reye syndrome.  Drink enough water and fluids to keep your urine clear or pale yellow.  Get a lot of rest.  Gargle with a salt-water mixture 3-4 times a day or as needed. To make a salt-water mixture, completely dissolve -1 tsp of salt in 1 cup of warm water.  If your health care provider approves, you may use throat lozenges or sprays to soothe your throat. Contact a health care provider if:  You have large, tender lumps in your neck.  You have a rash.  You cough up green, yellow-brown, or bloody spit. Get help right away if:  Your neck becomes stiff.  You drool or are unable to swallow liquids.  You cannot drink or take medicines without vomiting.  You have severe pain that does not go away, even after you take medicine.  You have trouble breathing, and it is not caused by a stuffy nose.  You have new pain and swelling in your joints such as   the knees, ankles, wrists, or elbows. Summary  Pharyngitis is redness, pain, and swelling (inflammation) of the throat (pharynx).  While pharyngitis can be caused by a bacteria, the most common causes are viral.  Most cases of pharyngitis get better on their own without treatment.  Bacterial pharyngitis is treated with antibiotic medicines. This  information is not intended to replace advice given to you by your health care provider. Make sure you discuss any questions you have with your health care provider. Document Released: 09/13/2005 Document Revised: 10/19/2016 Document Reviewed: 10/19/2016 Elsevier Interactive Patient Education  2019 [employer].    Otitis Media, Adult  Otitis media occurs when there is inflammation and fluid in the middle ear. Your middle ear is a part of the ear that contains bones for hearing as well as air that helps send sounds to your brain. What are the causes? This condition is caused by a blockage in the eustachian tube. This tube drains fluid from the ear to the back of the nose (nasopharynx). A blockage in this tube can be caused by an object or by swelling (edema) in the tube. Problems that can cause a blockage include:  A cold or other upper respiratory infection.  Allergies.  An irritant, such as tobacco smoke.  Enlarged adenoids. The adenoids are areas of soft tissue located high in the back of the throat, behind the nose and the roof of the mouth.  A mass in the nasopharynx.  Damage to the ear caused by pressure changes (barotrauma). What are the signs or symptoms? Symptoms of this condition include:  Ear pain.  A fever.  Decreased hearing.  A headache.  Tiredness (lethargy).  Fluid leaking from the ear.  Ringing in the ear. How is this diagnosed? This condition is diagnosed with a physical exam. During the exam your health care provider will use an instrument called an otoscope to look into your ear and check for redness, swelling, and fluid. He or she will also ask about your symptoms. Your health care provider may also order tests, such as:  A test to check the movement of the eardrum (pneumatic otoscopy). This test is done by squeezing a small amount of air into the ear.  A test that changes air pressure in the middle ear to check how well the eardrum moves and whether  the eustachian tube is working (tympanogram). How is this treated? This condition usually goes away on its own within 3-5 days. But if the condition is caused by a bacteria infection and does not go away own its own, or keeps coming back, your health care provider may:  Prescribe antibiotic medicines to treat the infection.  Prescribe or recommend medicines to control pain. Follow these instructions at home:  Take over-the-counter and prescription medicines only as told by your health care provider.  If you were prescribed an antibiotic medicine, take it as told by your health care provider. Do not stop taking the antibiotic even if you start to feel better.  Keep all follow-up visits as told by your health care provider. This is important. Contact a health care provider if:  You have bleeding from your nose.  There is a lump on your neck.  You are not getting better in 5 days.  You feel worse instead of better. Get help right away if:  You have severe pain that is not controlled with medicine.  You have swelling, redness, or pain around your ear.  You have stiffness in your neck.  A part of your face is paralyzed.  The bone behind your ear (mastoid) is tender when you touch it.  You develop a severe headache. Summary  Otitis media is redness, soreness, and swelling of the middle ear.  This condition usually goes away on its own within 3-5 days.  If the problem does not go away in 3-5 days, your health care provider may prescribe or recommend medicines to treat your symptoms.  If you were prescribed an antibiotic medicine, take it as told by your health care provider. This information is not intended to replace advice given to you by your health care provider. Make sure you discuss any questions you have with your health care provider. Document Released: 06/18/2004 Document Revised: 09/03/2016 Document Reviewed: 09/03/2016 Elsevier Interactive Patient Education  2019  [place].   Strep Test Negative Please take Augmentin as directed, Diflucan as needed- may repeat dose in one week if needed. Continue to remain well hydrated. Use OTC Acetaminophen as needed for discomfort. If symptoms persist after antibiotic completed please call clinic. If you notice any signs/symptoms of COVID-19 (ie; fever, chills, cough, new or worsening shortness of breath- please call clinic. FEEL BETTER!

## 2019-01-23 NOTE — Progress Notes (Signed)
Subjective:    Patient ID: Pamela Esparza, female    DOB: 02-Dec-1965, 53 y.o.   MRN: [id number]  HPI:  Ms. Pettis presents with sore throat (4/10, constant dull ache), R ear pain/pressure (6/10, constant sharp pressure), sinus pressure, clear nasal drainage, post nasal gtt that have been present >2 weeks. She denies fever/night sweats/cough/myalgia's She denies known COVID-19 exposure She denies tobacco/vape use She denies N/V/D She estimates to drink >60 oz water/day She has been using Rx Flonase, OTC essentials oils, Claritin- D, and Acetaminophen- last dose- >48 hrs ago Current temp 98.5 f oral  Patient Care Team    Relationship Specialty Notifications Start End  Esaw Grandchild, NP PCP - General Family Medicine  10/23/18     Patient Active Problem List   Diagnosis Date Noted  . Sore throat 01/23/2019  . Acute otitis media 01/23/2019  . Cough in adult 12/20/2018  . Healthcare maintenance 10/23/2018  . Chronic joint pain 10/23/2018  . Breast cancer screening 10/23/2018  . DDD (degenerative disc disease), lumbar 10/23/2018  . Muscle pain, fibromyalgia 10/23/2018     Past Medical History:  Diagnosis Date  . Arthritis   . Fibromyalgia   . Joint pain      Past Surgical History:  Procedure Laterality Date  . ABDOMINAL HYSTERECTOMY    . APPENDECTOMY    . CHOLECYSTECTOMY    . finger sugery       Family History  Problem Relation Age of Onset  . Cancer Mother   . Heart attack Father   . Hypertension Father   . Diabetes Father   . Birth defects Maternal Grandmother        ovarian  . Depression Maternal Grandmother   . Birth defects Paternal Grandmother        brain     Social History   Substance and Sexual Activity  Drug Use Never     Social History   Substance and Sexual Activity  Alcohol Use Yes   Comment: 4 drinks a year     Social History   Tobacco Use  Smoking Status Never Smoker  Smokeless Tobacco Never Used     Outpatient  Encounter Medications as of 01/23/2019  Medication Sig  . ALPRAZolam (XANAX) 0.5 MG tablet Take 0.5 mg by mouth daily as needed.  . baclofen (LIORESAL) 10 MG tablet Take 10 mg by mouth 3 (three) times daily as needed.  . Cholecalciferol (VITAMIN D3) 75 MCG (3000 UT) TABS Take 2,000 Int'l Units by mouth daily.  . DULoxetine (CYMBALTA) 60 MG capsule Take 120 mg by mouth daily.  Marland Kitchen estrogens, conjugated, (PREMARIN) 0.45 MG tablet Take by mouth.  . fluticasone (FLONASE) 50 MCG/ACT nasal spray SHAKE LIQUID AND USE 1 SPRAY IN EACH NOSTRIL DAILY  . gabapentin (NEURONTIN) 600 MG tablet Take 600 mg by mouth 3 (three) times daily.  . hydroxychloroquine (PLAQUENIL) 200 MG tablet Take 200 mg by mouth 2 (two) times daily.  Marland Kitchen levothyroxine (SYNTHROID, LEVOTHROID) 50 MCG tablet Take 50 mcg by mouth daily.  . meloxicam (MOBIC) 15 MG tablet Take 15 mg by mouth.  . neomycin-polymyxin-hydrocortisone (CORTISPORIN) 3.5-10000-1 OTIC suspension   . nitroGLYCERIN (NITROSTAT) 0.4 MG SL tablet Place under the tongue.  . nortriptyline (PAMELOR) 10 MG capsule Take 10 mg by mouth daily.  Marland Kitchen oxyCODONE-acetaminophen (PERCOCET) 10-325 MG tablet Take 1 tablet by mouth 2 (two) times daily.  . pilocarpine (SALAGEN) 5 MG tablet Take 5 mg by mouth 3 (three) times daily.  Marland Kitchen  amoxicillin-clavulanate (AUGMENTIN) 875-125 MG tablet Take 1 tablet by mouth 2 (two) times daily.  . fluconazole (DIFLUCAN) 150 MG tablet Take 1 tablet (150 mg total) by mouth once for 1 dose. May repeat dose in one week if needed  . [DISCONTINUED] diclofenac (VOLTAREN) 75 MG EC tablet Take 75 mg by mouth.  . [DISCONTINUED] predniSONE (DELTASONE) 20 MG tablet 1 tablet every 12 hrs for 3 days, then 1 tablet daily for 3 days   No facility-administered encounter medications on file as of 01/23/2019.     Allergies: Aspirin; Hydrocodone-acetaminophen; Lyrica [pregabalin]; and Topiramate  There is no height or weight on file to calculate BMI.  Blood pressure  123/86, pulse 88, temperature 98.5 F (36.9 C), temperature source Oral, weight 275 lb 4.8 oz (124.9 kg), SpO2 99 %.  Review of Systems  Constitutional: Positive for fatigue. Negative for activity change, appetite change, chills, diaphoresis, fever and unexpected weight change.  HENT: Positive for congestion, ear pain, facial swelling, postnasal drip, rhinorrhea, sinus pressure and sore throat. Negative for ear discharge, sinus pain, trouble swallowing and voice change.   Eyes: Negative for visual disturbance.  Respiratory: Negative for cough, chest tightness, shortness of breath and wheezing.   Cardiovascular: Negative for chest pain and leg swelling.  Gastrointestinal: Positive for constipation and diarrhea. Negative for abdominal distention, abdominal pain, blood in stool, nausea and vomiting.  Endocrine: Negative for cold intolerance, heat intolerance, polydipsia, polyphagia and polyuria.  Musculoskeletal: Positive for arthralgias, back pain and gait problem.  Hematological: Does not bruise/bleed easily.       Objective:   Physical Exam Vitals signs and nursing note reviewed.  Constitutional:      General: She is not in acute distress.    Appearance: She is obese. She is not ill-appearing, toxic-appearing or diaphoretic.  HENT:     Head: Normocephalic and atraumatic.     Right Ear: Hearing normal. No decreased hearing noted. No tenderness. Tympanic membrane is erythematous and bulging.     Left Ear: Hearing normal. No decreased hearing noted. Tympanic membrane is bulging. Tympanic membrane is not erythematous.     Nose: Mucosal edema present. No nasal tenderness.     Right Turbinates: Swollen.     Left Turbinates: Swollen.     Right Sinus: Maxillary sinus tenderness present. No frontal sinus tenderness.     Left Sinus: No maxillary sinus tenderness or frontal sinus tenderness.     Mouth/Throat:     Pharynx: Uvula midline. Posterior oropharyngeal erythema present.     Tonsils: No  tonsillar exudate. 1+ on the right. 1+ on the left.  Cardiovascular:     Rate and Rhythm: Normal rate.     Heart sounds: Normal heart sounds. No murmur. No friction rub. No gallop.   Pulmonary:     Effort: Pulmonary effort is normal. No respiratory distress.     Breath sounds: No stridor. No wheezing, rhonchi or rales.  Chest:     Chest wall: No tenderness.  Lymphadenopathy:     Cervical: Cervical adenopathy present.     Right cervical: Superficial cervical adenopathy present.  Skin:    General: Skin is warm and dry.     Capillary Refill: Capillary refill takes less than 2 seconds.  Neurological:     General: No focal deficit present.     Mental Status: She is alert and oriented to person, place, and time.  Psychiatric:        Mood and Affect: Mood normal.  Behavior: Behavior normal.       Assessment & Plan:   1. Sore throat   2. Acute otitis media, unspecified otitis media type     Acute otitis media Strep Test Negative Please take Augmentin as directed, Diflucan as needed- may repeat dose in one week if needed. Continue to remain well hydrated. Use OTC Acetaminophen as needed for discomfort. If symptoms persist after antibiotic completed please call clinic. If you notice any signs/symptoms of COVID-19 (ie; fever, chills, cough, new or worsening shortness of breath- please call clinic.  Sore throat Strep Test Negative Please take Augmentin as directed, Diflucan as needed- may repeat dose in one week if needed. Continue to remain well hydrated. Use OTC Acetaminophen as needed for discomfort. If symptoms persist after antibiotic completed please call clinic. If you notice any signs/symptoms of COVID-19 (ie; fever, chills, cough, new or worsening shortness of breath- please call clinic.    FOLLOW-UP:  Return if symptoms worsen or fail to improve.

## 2019-01-24 DIAGNOSIS — G44209 Tension-type headache, unspecified, not intractable: Secondary | ICD-10-CM | POA: Diagnosis not present

## 2019-01-24 DIAGNOSIS — M797 Fibromyalgia: Secondary | ICD-10-CM | POA: Diagnosis not present

## 2019-01-24 DIAGNOSIS — G3184 Mild cognitive impairment, so stated: Secondary | ICD-10-CM | POA: Diagnosis not present

## 2019-02-05 DIAGNOSIS — M7989 Other specified soft tissue disorders: Secondary | ICD-10-CM | POA: Diagnosis not present

## 2019-02-05 DIAGNOSIS — M3500 Sicca syndrome, unspecified: Secondary | ICD-10-CM | POA: Diagnosis not present

## 2019-03-21 ENCOUNTER — Other Ambulatory Visit: Payer: Self-pay

## 2019-03-21 ENCOUNTER — Ambulatory Visit: Payer: Medicare Other

## 2019-03-21 ENCOUNTER — Ambulatory Visit (INDEPENDENT_AMBULATORY_CARE_PROVIDER_SITE_OTHER): Payer: Federal, State, Local not specified - PPO | Admitting: Adult Health

## 2019-03-21 ENCOUNTER — Encounter: Payer: Self-pay | Admitting: Adult Health

## 2019-03-21 VITALS — BP 140/82 | HR 76 | Temp 99.2°F | Wt 281.7 lb

## 2019-03-21 DIAGNOSIS — M2550 Pain in unspecified joint: Secondary | ICD-10-CM

## 2019-03-21 DIAGNOSIS — Z0000 Encounter for general adult medical examination without abnormal findings: Secondary | ICD-10-CM

## 2019-03-21 DIAGNOSIS — M19071 Primary osteoarthritis, right ankle and foot: Secondary | ICD-10-CM | POA: Diagnosis not present

## 2019-03-21 DIAGNOSIS — M25571 Pain in right ankle and joints of right foot: Secondary | ICD-10-CM | POA: Insufficient documentation

## 2019-03-21 DIAGNOSIS — G8929 Other chronic pain: Secondary | ICD-10-CM

## 2019-03-21 DIAGNOSIS — E039 Hypothyroidism, unspecified: Secondary | ICD-10-CM | POA: Diagnosis not present

## 2019-03-21 DIAGNOSIS — Z23 Encounter for immunization: Secondary | ICD-10-CM | POA: Diagnosis not present

## 2019-03-21 MED ORDER — GABAPENTIN 600 MG PO TABS: 600.0000 mg | ORAL_TABLET | Freq: Three times a day (TID) | ORAL | 3 refills | Status: DC

## 2019-03-21 MED ORDER — FLUTICASONE PROPIONATE 50 MCG/ACT NA SUSP: NASAL | 3 refills | Status: DC

## 2019-03-21 NOTE — Assessment & Plan Note (Addendum)
Refills on your maintenance medications provided. Once your TSH results, I will send in refill on your Levothyroxine. Will call you with lab results tomorrow. Please schedule follow-up appt in 3 months for chronic medical conditions. Make it a morning appt and come fasting so that we can obtain cholesterol panel. Continue to social distance and wear a mask when in public.

## 2019-03-21 NOTE — Assessment & Plan Note (Addendum)
Xray- IMPRESSION: Negative for acute bony abnormality. Degenerative changes at the hindfoot.  Ice, elevate, and rest- right ankle. Continue to use you cane. Healing laceration- Tdap updated today- pt tolerated well.

## 2019-03-21 NOTE — Assessment & Plan Note (Signed)
Currently on Levothyroxine 73mcg QD TSH level checked today

## 2019-03-21 NOTE — Patient Instructions (Addendum)
Contusion  A contusion is a deep bruise. Contusions are the result of a blunt injury to tissues and muscle fibers under the skin. The injury causes bleeding under the skin. The skin overlying the contusion may turn blue, purple, or yellow. Minor injuries will give you a painless contusion, but more severe contusions may stay painful and swollen for a few weeks. What are the causes? This condition is usually caused by a blow, trauma, or direct force to an area of the body. What are the signs or symptoms? Symptoms of this condition include:  Swelling of the injured area.  Pain and tenderness in the injured area.  Discoloration. The area may have redness and then turn blue, purple, or yellow. How is this diagnosed? This condition is diagnosed based on a physical exam and medical history. An X-ray, CT scan, or MRI may be needed to determine if there are any associated injuries, such as broken bones (fractures). How is this treated? Specific treatment for this condition depends on what area of the body was injured. In general, the best treatment for a contusion is resting, icing, applying pressure to (compression), and elevating the injured area. This is often called the RICE strategy. Over-the-counter anti-inflammatory medicines may also be recommended for pain control. Follow these instructions at home:  Rest the injured area.  If directed, apply ice to the injured area: ? Put ice in a plastic bag. ? Place a towel between your skin and the bag. ? Leave the ice on for 20 minutes, 2-3 times per day.  If directed, apply light compression to the injured area using an elastic bandage. Make sure the bandage is not wrapped too tightly. Remove and reapply the bandage as directed by your health care provider.  If possible, raise (elevate) the injured area above the level of your heart while you are sitting or lying down.  Take over-the-counter and prescription medicines only as told by your health  care provider. Contact a health care provider if:  Your symptoms do not improve after several days of treatment.  Your symptoms get worse.  You have difficulty moving the injured area. Get help right away if:  You have severe pain.  You have numbness in a hand or foot.  Your hand or foot turns pale or cold. This information is not intended to replace advice given to you by your health care provider. Make sure you discuss any questions you have with your health care provider. Document Released: 06/23/2005 Document Revised: 01/22/2016 Document Reviewed: 01/29/2015 Elsevier Interactive Patient Education  2019 [place] on your maintenance medications provided. Once your TSH results, I will send in refill on your Levothyroxine. Will call you with lab results tomorrow. Ice, elevate, and rest- right ankle. Continue to use you cane. We will call you when Radiologist makes final on your ankle Xray. Please schedule follow-up appt in 3 months for chronic medical conditions. Make it a morning appt and come fasting so that we can obtain cholesterol panel. Continue to social distance and wear a mask when in public. FEEL BETTER!

## 2019-03-21 NOTE — Assessment & Plan Note (Signed)
[place] Controlled Substance Database reviewed- oxycodone 10mg -60 count refilled every 3.5- 4 weeks She denies over sedation

## 2019-03-21 NOTE — Progress Notes (Addendum)
Subjective:    Patient ID: Pamela Esparza, female    DOB: April 22, 1966, 53 y.o.   MRN: [id number]  HPI:  [name] presents with several issues- 1) R ankle pain r/t to injury that occurred 1 week ago. She was moving a cabinet and the doors swung open and struck her R ankle.  She suffered a laceration and reports pain and swelling has been worsening since then. She reports pain on lateral malleolus and distal fibula. She denies numbness/tingling in R toes. She always ambulates with a cane. She reports constant, described as "sharp", rated 6/10. She has been treating pain with current pain regime per pain management- Oxycodone 10mg  BID. 2) She has not followed up for chronic medical management and requires refills on several maintenance medications. She needs fasting labs, however recently ate prior to OV> Will obtain all non-fasting labs this afternoon. Will hold off refilling Levothyroxine until TSH level can be obtained. 3) She recently established with Rheumatologist- advised to request Hydroxychloroquine from that provider.  Patient Care Team    Relationship Specialty Notifications Start End  Esaw Grandchild, NP PCP - General Family Medicine  10/23/18     Patient Active Problem List   Diagnosis Date Noted  . Acute right ankle pain 03/21/2019  . Hypothyroidism 03/21/2019  . Sore throat 01/23/2019  . Acute otitis media 01/23/2019  . Cough in adult 12/20/2018  . Healthcare maintenance 10/23/2018  . Chronic joint pain 10/23/2018  . Breast cancer screening 10/23/2018  . DDD (degenerative disc disease), lumbar 10/23/2018  . Muscle pain, fibromyalgia 10/23/2018     Past Medical History:  Diagnosis Date  . Arthritis   . Fibromyalgia   . Joint pain      Past Surgical History:  Procedure Laterality Date  . ABDOMINAL HYSTERECTOMY    . APPENDECTOMY    . CHOLECYSTECTOMY    . finger sugery       Family History  Problem Relation Age of Onset  . Cancer Mother   . Heart  attack Father   . Hypertension Father   . Diabetes Father   . Birth defects Maternal Grandmother        ovarian  . Depression Maternal Grandmother   . Birth defects Paternal Grandmother        brain     Social History   Substance and Sexual Activity  Drug Use Never     Social History   Substance and Sexual Activity  Alcohol Use Yes   Comment: 4 drinks a year     Social History   Tobacco Use  Smoking Status Never Smoker  Smokeless Tobacco Never Used     Outpatient Encounter Medications as of 03/21/2019  Medication Sig  . ALPRAZolam (XANAX) 0.5 MG tablet Take 0.5 mg by mouth daily as needed.  . baclofen (LIORESAL) 10 MG tablet Take 10 mg by mouth 3 (three) times daily as needed.  . Cholecalciferol (VITAMIN D3) 75 MCG (3000 UT) TABS Take 2,000 Int'l Units by mouth daily.  . DULoxetine (CYMBALTA) 60 MG capsule Take 120 mg by mouth daily.  Marland Kitchen estrogens, conjugated, (PREMARIN) 0.45 MG tablet Take by mouth.  . fluticasone (FLONASE) 50 MCG/ACT nasal spray SHAKE LIQUID AND USE 1 SPRAY IN EACH NOSTRIL DAILY  . gabapentin (NEURONTIN) 600 MG tablet Take 1 tablet (600 mg total) by mouth 3 (three) times daily.  . hydroxychloroquine (PLAQUENIL) 200 MG tablet Take 200 mg by mouth 2 (two) times daily.  Marland Kitchen levothyroxine (SYNTHROID, LEVOTHROID) 50  MCG tablet Take 50 mcg by mouth daily.  . meloxicam (MOBIC) 15 MG tablet Take 15 mg by mouth.  . neomycin-polymyxin-hydrocortisone (CORTISPORIN) 3.5-10000-1 OTIC suspension   . nitroGLYCERIN (NITROSTAT) 0.4 MG SL tablet Place under the tongue.  Marland Kitchen oxyCODONE-acetaminophen (PERCOCET) 10-325 MG tablet Take 1 tablet by mouth 2 (two) times daily.  . pilocarpine (SALAGEN) 5 MG tablet Take 5 mg by mouth 3 (three) times daily.  . [DISCONTINUED] fluticasone (FLONASE) 50 MCG/ACT nasal spray SHAKE LIQUID AND USE 1 SPRAY IN EACH NOSTRIL DAILY  . [DISCONTINUED] gabapentin (NEURONTIN) 600 MG tablet Take 600 mg by mouth 3 (three) times daily.  .  [DISCONTINUED] amoxicillin-clavulanate (AUGMENTIN) 875-125 MG tablet Take 1 tablet by mouth 2 (two) times daily.  . [DISCONTINUED] nortriptyline (PAMELOR) 10 MG capsule Take 10 mg by mouth daily.   No facility-administered encounter medications on file as of 03/21/2019.     Allergies: Aspirin, Hydrocodone-acetaminophen, Lyrica [pregabalin], and Topiramate  There is no height or weight on file to calculate BMI.  Blood pressure 140/82, pulse 76, temperature 99.2 F (37.3 C), temperature source Oral, weight 281 lb 11.2 oz (127.8 kg), SpO2 99 %.  Review of Systems  Constitutional: Positive for activity change and fatigue. Negative for appetite change, chills, diaphoresis, fever and unexpected weight change.  HENT: Negative for congestion.   Eyes: Negative for visual disturbance.  Respiratory: Negative for cough, chest tightness, shortness of breath, wheezing and stridor.   Cardiovascular: Negative for chest pain, palpitations and leg swelling.  Gastrointestinal: Negative for abdominal distention, anal bleeding, blood in stool, constipation, diarrhea, nausea and vomiting.  Endocrine: Negative for cold intolerance, heat intolerance, polydipsia, polyphagia and polyuria.  Musculoskeletal: Positive for arthralgias, back pain, gait problem, joint swelling and myalgias. Negative for neck pain and neck stiffness.  Skin: Positive for wound. Negative for color change, pallor and rash.  Neurological: Negative for dizziness and headaches.  Hematological: Negative for adenopathy. Does not bruise/bleed easily.  Psychiatric/Behavioral: Positive for dysphoric mood and sleep disturbance. Negative for agitation, behavioral problems, confusion, decreased concentration, hallucinations, self-injury and suicidal ideas. The patient is nervous/anxious. The patient is not hyperactive.        Objective:   Physical Exam Vitals signs and nursing note reviewed.  Constitutional:      General: She is not in acute  distress.    Appearance: She is obese. She is not ill-appearing, toxic-appearing or diaphoretic.  HENT:     Head: Normocephalic and atraumatic.  Musculoskeletal:        General: Swelling, tenderness and signs of injury present.     Right ankle: She exhibits swelling and laceration. She exhibits normal range of motion, no ecchymosis and normal pulse. Tenderness. Lateral malleolus tenderness found. Achilles tendon exhibits no pain and normal Thompson's test results.     Left ankle: Normal.     Right foot: Normal range of motion and normal capillary refill. No tenderness, bony tenderness, swelling, crepitus or laceration.     Comments: R ankle: Tenderness and edema over lateral malleolus. Healing laceration over lateral malleolus- no open tissue or drainage noted. No streaking noted. Normal ROM, however she is cautious with movement. Strong pedal pulses. Unable to check cap refill due to onychomycosis of toenails   Skin:    Capillary Refill: Capillary refill takes less than 2 seconds.  Neurological:     Mental Status: She is alert and oriented to person, place, and time.  Psychiatric:        Mood and Affect: Mood normal.  Behavior: Behavior normal.        Thought Content: Thought content normal.        Judgment: Judgment normal.        Assessment & Plan:   1. Acute right ankle pain   2. Hypothyroidism, unspecified type   3. Healthcare maintenance   4. Need for Tdap vaccination   5. Chronic joint pain     Acute right ankle pain Xray- IMPRESSION: Negative for acute bony abnormality. Degenerative changes at the hindfoot.  Ice, elevate, and rest- right ankle. Continue to use you cane. Healing laceration- Tdap updated today- pt tolerated well.    Healthcare maintenance Refills on your maintenance medications provided. Once your TSH results, I will send in refill on your Levothyroxine. Will call you with lab results tomorrow. Please schedule follow-up appt in 3  months for chronic medical conditions. Make it a morning appt and come fasting so that we can obtain cholesterol panel. Continue to social distance and wear a mask when in public.   Hypothyroidism Currently on Levothyroxine 11mcg QD TSH level checked today   Chronic joint pain [place] Controlled Substance Database reviewed- oxycodone 10mg -60 count refilled every 3.5- 4 weeks She denies over sedation    FOLLOW-UP:  Return in about 3 months (around 06/21/2019) for Regular Follow Up, Obesity, Depression, Musculoskeletal Pain.

## 2019-03-22 ENCOUNTER — Telehealth: Payer: Self-pay | Admitting: Adult Health

## 2019-03-22 NOTE — Telephone Encounter (Signed)
Forwarding message to medical assistant that patient returned her call .  -glh

## 2019-03-23 ENCOUNTER — Other Ambulatory Visit: Payer: Self-pay | Admitting: Adult Health

## 2019-03-23 LAB — COMPREHENSIVE METABOLIC PANEL
ALT: 29 IU/L (ref 0–32)
AST: 28 IU/L (ref 0–40)
Albumin/Globulin Ratio: 1.6 (ref 1.2–2.2)
Albumin: 4.4 g/dL (ref 3.8–4.9)
Alkaline Phosphatase: 99 IU/L (ref 39–117)
BUN/Creatinine Ratio: 19 (ref 9–23)
BUN: 12 mg/dL (ref 6–24)
Bilirubin Total: 0.3 mg/dL (ref 0.0–1.2)
CO2: 28 mmol/L (ref 20–29)
Calcium: 9.7 mg/dL (ref 8.7–10.2)
Chloride: 102 mmol/L (ref 96–106)
Creatinine, Ser: 0.63 mg/dL (ref 0.57–1.00)
GFR calc Af Amer: 118 mL/min/{1.73_m2} (ref >59)
GFR calc non Af Amer: 102 mL/min/{1.73_m2} (ref >59)
Globulin, Total: 2.7 g/dL (ref 1.5–4.5)
Glucose: 102 mg/dL — ABNORMAL HIGH (ref 65–99)
Potassium: 4.6 mmol/L (ref 3.5–5.2)
Sodium: 144 mmol/L (ref 134–144)
Total Protein: 7.1 g/dL (ref 6.0–8.5)

## 2019-03-23 LAB — CBC WITH DIFFERENTIAL/PLATELET
Basophils Absolute: 0.1 10*3/uL (ref 0.0–0.2)
Basos: 1 %
EOS (ABSOLUTE): 0.2 10*3/uL (ref 0.0–0.4)
Eos: 2 %
Hematocrit: 41.6 % (ref 34.0–46.6)
Hemoglobin: 14 g/dL (ref 11.1–15.9)
Immature Grans (Abs): 0.1 10*3/uL (ref 0.0–0.1)
Immature Granulocytes: 1 %
Lymphocytes Absolute: 2.5 10*3/uL (ref 0.7–3.1)
Lymphs: 36 %
MCH: 30.4 pg (ref 26.6–33.0)
MCHC: 33.7 g/dL (ref 31.5–35.7)
MCV: 90 fL (ref 79–97)
Monocytes Absolute: 0.4 10*3/uL (ref 0.1–0.9)
Monocytes: 6 %
Neutrophils Absolute: 3.8 10*3/uL (ref 1.4–7.0)
Neutrophils: 54 %
Platelets: 308 10*3/uL (ref 150–450)
RBC: 4.61 x10E6/uL (ref 3.77–5.28)
RDW: 13.3 % (ref 11.7–15.4)
WBC: 7 10*3/uL (ref 3.4–10.8)

## 2019-03-23 LAB — HEMOGLOBIN A1C
Est. average glucose Bld gHb Est-mCnc: 108 mg/dL
Hgb A1c MFr Bld: 5.4 % (ref 4.8–5.6)

## 2019-03-23 LAB — TSH: TSH: 2.74 u[IU]/mL (ref 0.450–4.500)

## 2019-03-23 MED ORDER — LEVOTHYROXINE SODIUM 50 MCG PO TABS: 50.0000 ug | ORAL_TABLET | Freq: Every day | ORAL | 2 refills | Status: DC

## 2019-03-26 NOTE — Telephone Encounter (Signed)
See documentation in result notes.  Charyl Bigger, CMA

## 2019-03-29 DIAGNOSIS — G8929 Other chronic pain: Secondary | ICD-10-CM | POA: Diagnosis not present

## 2019-03-29 DIAGNOSIS — M4726 Other spondylosis with radiculopathy, lumbar region: Secondary | ICD-10-CM | POA: Diagnosis not present

## 2019-03-29 DIAGNOSIS — M7918 Myalgia, other site: Secondary | ICD-10-CM | POA: Diagnosis not present

## 2019-03-29 DIAGNOSIS — M4802 Spinal stenosis, cervical region: Secondary | ICD-10-CM | POA: Diagnosis not present

## 2019-03-29 DIAGNOSIS — G588 Other specified mononeuropathies: Secondary | ICD-10-CM | POA: Diagnosis not present

## 2019-03-29 DIAGNOSIS — M533 Sacrococcygeal disorders, not elsewhere classified: Secondary | ICD-10-CM | POA: Diagnosis not present

## 2019-03-29 DIAGNOSIS — M1711 Unilateral primary osteoarthritis, right knee: Secondary | ICD-10-CM | POA: Diagnosis not present

## 2019-03-29 DIAGNOSIS — M461 Sacroiliitis, not elsewhere classified: Secondary | ICD-10-CM | POA: Diagnosis not present

## 2019-03-29 DIAGNOSIS — M25561 Pain in right knee: Secondary | ICD-10-CM | POA: Diagnosis not present

## 2019-03-29 DIAGNOSIS — M47812 Spondylosis without myelopathy or radiculopathy, cervical region: Secondary | ICD-10-CM | POA: Diagnosis not present

## 2019-03-29 DIAGNOSIS — M5030 Other cervical disc degeneration, unspecified cervical region: Secondary | ICD-10-CM | POA: Diagnosis not present

## 2019-03-29 DIAGNOSIS — Z79891 Long term (current) use of opiate analgesic: Secondary | ICD-10-CM | POA: Diagnosis not present

## 2019-03-29 DIAGNOSIS — M961 Postlaminectomy syndrome, not elsewhere classified: Secondary | ICD-10-CM | POA: Diagnosis not present

## 2019-03-29 DIAGNOSIS — M5116 Intervertebral disc disorders with radiculopathy, lumbar region: Secondary | ICD-10-CM | POA: Diagnosis not present

## 2019-05-08 DIAGNOSIS — Z6841 Body Mass Index (BMI) 40.0 and over, adult: Secondary | ICD-10-CM | POA: Diagnosis not present

## 2019-05-08 DIAGNOSIS — M3500 Sicca syndrome, unspecified: Secondary | ICD-10-CM | POA: Diagnosis not present

## 2019-05-08 DIAGNOSIS — M7989 Other specified soft tissue disorders: Secondary | ICD-10-CM | POA: Diagnosis not present

## 2019-06-05 ENCOUNTER — Emergency Department (HOSPITAL_COMMUNITY)
Admission: EM | Admit: 2019-06-05 | Discharge: 2019-06-05 | Disposition: A | Discharge status: Home / Self Care | Payer: Federal, State, Local not specified - PPO | Attending: Emergency Medicine | Admitting: Emergency Medicine

## 2019-06-05 ENCOUNTER — Telehealth: Payer: Self-pay

## 2019-06-05 ENCOUNTER — Emergency Department (HOSPITAL_COMMUNITY): Payer: Federal, State, Local not specified - PPO

## 2019-06-05 ENCOUNTER — Other Ambulatory Visit: Payer: Self-pay

## 2019-06-05 ENCOUNTER — Telehealth: Payer: Self-pay | Admitting: Adult Health

## 2019-06-05 DIAGNOSIS — S0990XA Unspecified injury of head, initial encounter: Secondary | ICD-10-CM | POA: Insufficient documentation

## 2019-06-05 DIAGNOSIS — R51 Headache: Secondary | ICD-10-CM | POA: Diagnosis not present

## 2019-06-05 DIAGNOSIS — Y999 Unspecified external cause status: Secondary | ICD-10-CM | POA: Insufficient documentation

## 2019-06-05 DIAGNOSIS — M546 Pain in thoracic spine: Secondary | ICD-10-CM | POA: Diagnosis not present

## 2019-06-05 DIAGNOSIS — S299XXA Unspecified injury of thorax, initial encounter: Secondary | ICD-10-CM | POA: Diagnosis not present

## 2019-06-05 DIAGNOSIS — S60211A Contusion of right wrist, initial encounter: Secondary | ICD-10-CM | POA: Diagnosis not present

## 2019-06-05 DIAGNOSIS — Y9316 Activity, rowing, canoeing, kayaking, rafting and tubing: Secondary | ICD-10-CM | POA: Insufficient documentation

## 2019-06-05 DIAGNOSIS — E039 Hypothyroidism, unspecified: Secondary | ICD-10-CM | POA: Insufficient documentation

## 2019-06-05 DIAGNOSIS — M25531 Pain in right wrist: Secondary | ICD-10-CM

## 2019-06-05 DIAGNOSIS — M542 Cervicalgia: Secondary | ICD-10-CM | POA: Diagnosis not present

## 2019-06-05 DIAGNOSIS — W19XXXA Unspecified fall, initial encounter: Secondary | ICD-10-CM

## 2019-06-05 DIAGNOSIS — Y92838 Other recreation area as the place of occurrence of the external cause: Secondary | ICD-10-CM | POA: Insufficient documentation

## 2019-06-05 DIAGNOSIS — Z79899 Other long term (current) drug therapy: Secondary | ICD-10-CM | POA: Diagnosis not present

## 2019-06-05 DIAGNOSIS — S199XXA Unspecified injury of neck, initial encounter: Secondary | ICD-10-CM | POA: Diagnosis not present

## 2019-06-05 NOTE — Discharge Instructions (Addendum)
Please continue taking your regular pain medications and muscle relaxers at home.   Please follow up with your primary care provider within 5-7 days for re-evaluation of your symptoms. If you do not have a primary care provider, information for a healthcare clinic has been provided for you to make arrangements for follow up care. Please return to the emergency department for any new or worsening symptoms.
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
## 2019-06-05 NOTE — Telephone Encounter (Signed)
Noted.  T. Donta Mcinroy, CMA 

## 2019-06-05 NOTE — ED Notes (Signed)
Patient transported to X-ray 

## 2019-06-05 NOTE — Telephone Encounter (Signed)
Patient called states was glad provider urged her to go to ED / Concussion was confirm---Pt advised by hospitalist to f/u w/ PCP for addt'l treatment & wanted provider to know.  --Forwarding message.  --glh

## 2019-06-11 DIAGNOSIS — M533 Sacrococcygeal disorders, not elsewhere classified: Secondary | ICD-10-CM | POA: Diagnosis not present

## 2019-06-11 DIAGNOSIS — M461 Sacroiliitis, not elsewhere classified: Secondary | ICD-10-CM | POA: Diagnosis not present

## 2019-06-11 DIAGNOSIS — M5116 Intervertebral disc disorders with radiculopathy, lumbar region: Secondary | ICD-10-CM | POA: Diagnosis not present

## 2019-06-11 DIAGNOSIS — Z79891 Long term (current) use of opiate analgesic: Secondary | ICD-10-CM | POA: Diagnosis not present

## 2019-06-11 DIAGNOSIS — M961 Postlaminectomy syndrome, not elsewhere classified: Secondary | ICD-10-CM | POA: Diagnosis not present

## 2019-06-11 DIAGNOSIS — M792 Neuralgia and neuritis, unspecified: Secondary | ICD-10-CM | POA: Diagnosis not present

## 2019-06-11 DIAGNOSIS — Z79899 Other long term (current) drug therapy: Secondary | ICD-10-CM | POA: Diagnosis not present

## 2019-06-11 DIAGNOSIS — M4802 Spinal stenosis, cervical region: Secondary | ICD-10-CM | POA: Diagnosis not present

## 2019-06-11 DIAGNOSIS — M5010 Cervical disc disorder with radiculopathy, unspecified cervical region: Secondary | ICD-10-CM | POA: Diagnosis not present

## 2019-06-11 DIAGNOSIS — M4722 Other spondylosis with radiculopathy, cervical region: Secondary | ICD-10-CM | POA: Diagnosis not present

## 2019-06-11 DIAGNOSIS — M7918 Myalgia, other site: Secondary | ICD-10-CM | POA: Diagnosis not present

## 2019-06-11 DIAGNOSIS — Z6841 Body Mass Index (BMI) 40.0 and over, adult: Secondary | ICD-10-CM | POA: Diagnosis not present

## 2019-06-11 DIAGNOSIS — M5416 Radiculopathy, lumbar region: Secondary | ICD-10-CM | POA: Diagnosis not present

## 2019-06-11 DIAGNOSIS — M4726 Other spondylosis with radiculopathy, lumbar region: Secondary | ICD-10-CM | POA: Diagnosis not present
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
## 2019-06-21 ENCOUNTER — Other Ambulatory Visit: Payer: Self-pay

## 2019-06-21 ENCOUNTER — Other Ambulatory Visit: Payer: Federal, State, Local not specified - PPO

## 2019-06-21 ENCOUNTER — Encounter: Payer: Self-pay | Admitting: Adult Health

## 2019-06-21 ENCOUNTER — Ambulatory Visit (INDEPENDENT_AMBULATORY_CARE_PROVIDER_SITE_OTHER): Payer: Federal, State, Local not specified - PPO | Admitting: Adult Health

## 2019-06-21 ENCOUNTER — Inpatient Hospital Stay: Payer: Medicare Other | Admitting: Adult Health

## 2019-06-21 VITALS — BP 143/82 | HR 71 | Temp 98.3°F | Wt 275.9 lb

## 2019-06-21 DIAGNOSIS — M25571 Pain in right ankle and joints of right foot: Secondary | ICD-10-CM

## 2019-06-21 DIAGNOSIS — S060X1D Concussion with loss of consciousness of 30 minutes or less, subsequent encounter: Secondary | ICD-10-CM

## 2019-06-21 DIAGNOSIS — Z0000 Encounter for general adult medical examination without abnormal findings: Secondary | ICD-10-CM

## 2019-06-21 DIAGNOSIS — S060XAA Concussion with loss of consciousness status unknown, initial encounter: Secondary | ICD-10-CM | POA: Insufficient documentation

## 2019-06-21 DIAGNOSIS — G8929 Other chronic pain: Secondary | ICD-10-CM | POA: Diagnosis not present

## 2019-06-21 DIAGNOSIS — S060X9A Concussion with loss of consciousness of unspecified duration, initial encounter: Secondary | ICD-10-CM | POA: Insufficient documentation

## 2019-06-21 NOTE — Assessment & Plan Note (Signed)
02/2019 Xray- FINDINGS: No acute displaced fracture. Enthesopathic changes at the Achilles insertion and plantar fascia insertion.  Degenerative changes of the hindfoot.  Ankle mortise is congruent.  No joint effusion.  No significant soft tissue swelling.  IMPRESSION: Negative for acute bony abnormality.  Degenerative changes at the hindfoot.  She continues to experience pain Referral placed for Orthopedic Specialist, requested lower ext specialist

## 2019-06-21 NOTE — Assessment & Plan Note (Signed)
Neurological exam- stable

## 2019-06-21 NOTE — Patient Instructions (Addendum)
Concussion, Adult  A concussion is a brain injury from a hard, direct hit (trauma) to the head or body. This direct hit causes the brain to shake quickly back and forth inside the skull. This can damage brain cells and cause chemical changes in the brain. A concussion may also be known as a mild traumatic brain injury (TBI). Concussions are usually not life-threatening, but the effects of a concussion can be serious. If you have a concussion, you should be very careful to avoid having a second concussion. What are the causes? This condition is caused by:  A direct hit to your head, such as: ? Running into another player during a game. ? Being hit in a fight. ? Hitting your head on a hard surface.  Sudden movement of your body that causes your brain to move back and forth inside the skull, such as in a car crash. What are the signs or symptoms? The signs of a concussion can be hard to notice. Early on, they may be missed by you, family members, and health care providers. You may look fine on the outside but may act or feel differently. Symptoms are usually temporary and most often improve in 7-10 days. Some symptoms appear right away, but other symptoms may not show up for hours or days. If your symptoms last longer than normal, you may have post-concussion syndrome. Every head injury is different. Physical symptoms  Headaches. This can include a feeling of pressure in the head or migraine-like symptoms.  Tiredness (fatigue).  Dizziness.  Problems with coordination or balance.  Vision or hearing problems.  Sensitivity to light or noise.  Nausea or vomiting.  Changes in eating or sleeping patterns.  Numbness or tingling.  Seizure. Mental and emotional symptoms  Memory problems.  Trouble concentrating, organizing, or making decisions.  Slowness in thinking, acting or reacting, speaking, or reading.  Irritability or mood changes.  Anxiety or depression. How is this  diagnosed? This condition is diagnosed based on:  Your symptoms.  A description of your injury. You may also have tests, including:  Imaging tests, such as a CT scan or MRI.  Neuropsychological tests. These measure your thinking, understanding, learning, and remembering abilities. How is this treated? Treatment for this condition includes:  Stopping sports or activity if you are injured. If you hit your head or show signs of concussion: ? Do not return to sports or activities the same day. ? Get checked by a health care provider before you return to your activities.  Physical and mental rest and careful observation, usually at home. Gradually return to your normal activities.  Medicines to help with symptoms such as headaches, nausea, or difficulty sleeping. ? Avoid taking opioid pain medicine while recovering from a concussion.  Avoiding alcohol and drugs. These may slow your recovery and can put you at risk of further injury.  Referral to a concussion clinic or rehabilitation center. Recovery from a concussion can take time. How fast you recover depends on many factors. Return to activities only when:  Your symptoms are completely gone.  Your health care provider says that it is safe. Follow these instructions at home: Activity  Limit activities that require a lot of thought or concentration, such as: ? Doing homework or job-related work. ? Watching TV. ? Working on the computer or phone. ? Playing memory games and puzzles.  Rest. Rest helps your brain heal. Make sure you: ? Get plenty of sleep. Most adults should get 7-9 hours of sleep   each night. ? Rest during the day. Take naps or rest breaks when you feel tired.  Avoid physical activity like exercise until your health care provider says it is safe. Stop any activity that worsens symptoms.  Do not do high-risk activities that could cause a second concussion, such as riding a bike or playing sports.  Ask your  health care provider when you can return to your normal activities, such as school, work, athletics, and driving. Your ability to react may be slower after a brain injury. Never do these activities if you are dizzy. Your health care provider will likely give you a plan for gradually returning to activities. General instructions   Take over-the-counter and prescription medicines only as told by your health care provider. Some medicines, such as blood thinners (anticoagulants) and aspirin, may increase the risk for complications, such as bleeding.  Do not drink alcohol until your health care provider says you can.  Watch your symptoms and tell others around you to do the same. Complications sometimes occur after a concussion. Older adults with a brain injury may have a higher risk of serious complications.  Tell your work manager, teachers, school nurse, school counselor, coach, or athletic trainer about your injury, symptoms, and restrictions.  Keep all follow-up visits as told by your health care provider. This is important. How is this prevented? Avoiding another brain injury is very important. In rare cases, another injury can lead to permanent brain damage, brain swelling, or death. The risk of this is greatest during the first 7-10 days after a head injury. Avoid injuries by:  Stopping activities that could lead to a second concussion, such as contact or recreational sports, until your health care provider says it is okay.  Taking these actions once you have returned to sports or activities: ? Avoiding plays or moves that can cause you to crash into another person. This is how most concussions occur. ? Following the rules and being respectful of other players. Do not engage in violent or illegal plays.  Getting regular exercise that includes strength and balance training.  Wearing a properly fitting helmet during sports, biking, or other activities. Helmets can help protect you from  serious skull and brain injuries, but they do not protect you from a concussion. Even when wearing a helmet, you should avoid being hit in the head. Contact a health care provider if:  Your symptoms get worse or they do not improve.  You have new symptoms.  You have another injury. Get help right away if:  You have severe or worsening headaches.  You have weakness or numbness in any part of your body.  You are confused.  Your coordination gets worse.  You vomit repeatedly.  You are sleepier than normal.  Your speech is slurred.  You cannot recognize people or places.  You have a seizure.  It is difficult to wake you up.  You have unusual behavior changes.  You have changes in your vision.  You lose consciousness. Summary  A concussion is a brain injury that results from a hard, direct hit (trauma) to your head or body.  You may have imaging tests and neuropsychological tests to diagnose a concussion.  Treatment for this condition includes physical and mental rest and careful observation.  Ask your health care provider when you can return to your normal activities, such as school, work, athletics, and driving.  Get help right away if you have a severe headache, weakness on one side of the   body, seizures, behavior changes, changes in vision, or if you are confused or sleepier than normal. This information is not intended to replace advice given to you by your health care provider. Make sure you discuss any questions you have with your health care provider. Document Released: 12/04/2003 Document Revised: 05/04/2018 Document Reviewed: 05/04/2018 Elsevier Patient Education  2020 [place] all medications as directed. Referral to Orthopedic Specialist placed, requested lower extremity specialist to address right ankle pain. We will contact you with lab results. Please schedule complete physical in 4 weeks. Continue to social distance and wear a mask when  in public. GREAT TO SEE YOU!

## 2019-06-21 NOTE — Assessment & Plan Note (Signed)
Continue all medications as directed. Referral to Orthopedic Specialist placed, requested lower extremity specialist to address right ankle pain. We will contact you with lab results. Please schedule complete physical in 4 weeks. Continue to social distance and wear a mask when in public.

## 2019-06-22 LAB — LIPID PANEL
Chol/HDL Ratio: 2.9 ratio (ref 0.0–4.4)
Cholesterol, Total: 236 mg/dL — ABNORMAL HIGH (ref 100–199)
HDL: 81 mg/dL (ref >39)
LDL Chol Calc (NIH): 139 mg/dL — ABNORMAL HIGH (ref 0–99)
Triglycerides: 91 mg/dL (ref 0–149)
VLDL Cholesterol Cal: 16 mg/dL (ref 5–40)

## 2019-06-25 ENCOUNTER — Ambulatory Visit: Payer: Self-pay

## 2019-06-25 ENCOUNTER — Ambulatory Visit (INDEPENDENT_AMBULATORY_CARE_PROVIDER_SITE_OTHER): Payer: Federal, State, Local not specified - PPO | Admitting: Physician Assistant

## 2019-06-25 ENCOUNTER — Encounter: Payer: Self-pay | Admitting: Physician Assistant

## 2019-06-25 DIAGNOSIS — M25571 Pain in right ankle and joints of right foot: Secondary | ICD-10-CM | POA: Diagnosis not present

## 2019-06-25 DIAGNOSIS — M9261 Juvenile osteochondrosis of tarsus, right ankle: Secondary | ICD-10-CM | POA: Insufficient documentation

## 2019-06-25 NOTE — Progress Notes (Signed)
Office Visit Note   Patient: Pamela Esparza           Date of Birth: 1965/10/31           MRN: [id number] Visit Date: 06/25/2019              Requested by: Esaw Grandchild, NP [place],  [place] [address] PCP: Esaw Grandchild, NP   Assessment & Plan: Visit Diagnoses:  1. Pain in right ankle and joints of right foot   2. Haglund's deformity of right heel     Plan: We will send her to formal physical therapy for gastrocsoleus stretching.  Modalities to the Achilles insertion on the right.  Home exercise program.  She is given on 16th inch heel lifts to place in both shoes.  She was discussed with her at length.  She will wear the inserts for the next 3 weeks.  She will pick up Voltaren gel and begin using this 4 times daily over the Achilles insertion on the right heel.  Follow-up with [place] in 6 weeks.  Follow-Up Instructions: Return in about 6 weeks (around 08/06/2019).   Orders:  Orders Placed This Encounter  Procedures  . XR Ankle Complete Right   No orders of the defined types were placed in this encounter.     Procedures: No procedures performed   Clinical Data: No additional findings.   Subjective: Chief Complaint  Patient presents with  . Right Ankle - Pain    HPI Pamela Esparza is a 53 year old female were seen for the first time for right ankle pain.  She is ambulating with a cane due to the pain in her right heel.  She did sprain her ankle in March of this year and reinjured the Labor Day.  Mostly having burning pain posterior aspect of her right ankle.  Is worse with weightbearing.  She usually wears a shoe with the back as long as the shoe is not made of stiff leather.  Review of Systems Negative for fevers chills shortness of breath chest pain  Objective: Vital Signs: There were no vitals taken for this visit.  Physical Exam Constitutional:      Appearance: She is not ill-appearing or diaphoretic.  Neurological:     Mental Status: She is  alert and oriented to person, place, and time.     Ortho Exam Right ankle good dorsiflexion plantarflexion.  Thompson test is negative.  She has tenderness mid Achilles and down to the Achilles insertion.  Maximal tenderness at the Achilles insertion right heel.  No ecchymosis erythema or signs of infection.  She has no tenderness over the posterior tibial tendons peroneal tendons.  Right calf supple nontender. Specialty Comments:  No specialty comments available.  Imaging: Xr Ankle Complete Right  Result Date: 06/25/2019 Right ankle 3 views: Talus well located within the ankle mortise no diastases.  Large Haglund's deformity with calcific changes within the distal Achilles.  No acute fracture.    PMFS History: Patient Active Problem List   Diagnosis Date Noted  . Haglund's deformity of right heel 06/25/2019  . Concussion 06/21/2019  . Acute right ankle pain 03/21/2019  . Hypothyroidism 03/21/2019  . Sore throat 01/23/2019  . Acute otitis media 01/23/2019  . Cough in adult 12/20/2018  . Healthcare maintenance 10/23/2018  . Chronic joint pain 10/23/2018  . Breast cancer screening 10/23/2018  . DDD (degenerative disc disease), lumbar 10/23/2018  . Muscle pain, fibromyalgia 10/23/2018   Past Medical  History:  Diagnosis Date  . Arthritis   . Fibromyalgia   . Joint pain     Family History  Problem Relation Age of Onset  . Cancer Mother   . Heart attack Father   . Hypertension Father   . Diabetes Father   . Birth defects Maternal Grandmother        ovarian  . Depression Maternal Grandmother   . Birth defects Paternal Grandmother        brain    Past Surgical History:  Procedure Laterality Date  . ABDOMINAL HYSTERECTOMY    . APPENDECTOMY    . CHOLECYSTECTOMY    . finger sugery     Social History   Occupational History  . Not on file  Tobacco Use  . Smoking status: Never Smoker  . Smokeless tobacco: Never Used  Substance and Sexual Activity  . Alcohol use: Yes     Comment: 4 drinks a year  . Drug use: Never  . Sexual activity: Not Currently

## 2019-06-27 ENCOUNTER — Other Ambulatory Visit: Payer: Self-pay | Admitting: Adult Health

## 2019-07-24 DIAGNOSIS — M7661 Achilles tendinitis, right leg: Secondary | ICD-10-CM | POA: Diagnosis not present

## 2019-07-26 ENCOUNTER — Encounter: Payer: Medicare Other | Admitting: Adult Health

## 2019-07-26 DIAGNOSIS — M797 Fibromyalgia: Secondary | ICD-10-CM | POA: Diagnosis not present

## 2019-07-26 DIAGNOSIS — H9319 Tinnitus, unspecified ear: Secondary | ICD-10-CM | POA: Diagnosis not present

## 2019-07-27 DIAGNOSIS — M7661 Achilles tendinitis, right leg: Secondary | ICD-10-CM | POA: Diagnosis not present

## 2019-07-31 DIAGNOSIS — M7661 Achilles tendinitis, right leg: Secondary | ICD-10-CM | POA: Diagnosis not present

## 2019-08-01 ENCOUNTER — Telehealth: Payer: Self-pay | Admitting: Physician Assistant

## 2019-08-01 NOTE — Telephone Encounter (Signed)
Ok to get her a cam boot with 9/16 inch  Heel lift.

## 2019-08-01 NOTE — Telephone Encounter (Signed)
Patient called and left a message on the voicemail. She would like Artis Delay to know that the PT is not working and she would like to discuss getting a boot. Her call back number is [phone]

## 2019-08-01 NOTE — Telephone Encounter (Signed)
See below

## 2019-08-02 DIAGNOSIS — M7661 Achilles tendinitis, right leg: Secondary | ICD-10-CM | POA: Diagnosis not present

## 2019-08-02 NOTE — Telephone Encounter (Signed)
Patient aware boot at front desk

## 2019-08-12 NOTE — Progress Notes (Signed)
Subjective:    Patient ID: Pamela Esparza, female    DOB: 11/01/1965, 53 y.o.   MRN: [id number]  HPI: 10/23/2018 OV:  Pamela Esparza is here to establish as a new pt.  She is a pleasant 53 year old female. PMH:DDD, Fibromyalgia, Obesity, Chronic Pain, Memory Deficit, Depression/Anxiety, Chronic HAs, Hypothyroidism  She suffered back injury in 1988  She if followed by [facility], every 2 months- Oxycodone 10mg  BID She reports acute anxiety treated with meditation and essential oils and PRN SL Nitro- lat dose "years ago" Dr. [employer]- treating memory, generalized weakness, HA She reports ambulating safely with cane She denies regular exercise, declined referral PT She is interested in "Aquatic Therapy" She is married with adult-age child at home She denies tobacco/vape/ETOH use She reports stable mood    08/14/2019 OV: [name] is here regular f/u: Hypothyroidism, Fibromyalgia, OA She reports an acute illness 3 weeks ago- low grade nausea without vomiting, non-productive, low grade fever. She did not have SARS-CoV-2 testing was completed. Since then she has had increasing fatigue.  03/21/2019 TSH- 2.740- currently on Levothyroxine 59mcq QD  She has been reducing saturated fat in diet, unable to exercise due to chronic pain. She continues to experience lower extremity weakness- no recent falls.  She has chronic constipation- uses OTC stool softners.  She estimates to drink 6 x 16 oz bottles of water day. She reports nausea after beginning to eat meals. She does not have a gallbladder. She is past due on her colonoscopy- referral to GI placed.  She has several atypical nevi that are growing in size- referral to Derm placed.  She is followed by Neurology Q6M She is followed by Rheumatology Q3M She is followed by Pain Mgt Q2M  She never completed her Mammogram- provided contact to local imaging.  Lab Results  Component Value Date    HGBA1C 5.4 03/21/2019   06/21/2019 Labs: The 10-year ASCVD risk score Pamela Bussing DC Jr., et al., 2013) is: 1.8%  Tot- 236, normal <199  TGs-91-good  HDL-81- excellent!  LDL-139, <99   Her father had several MIs in late 40s/early 62s- He never had stents or CABG  She is not fasting today- will check Direct LDL  Patient Care Team    Relationship Specialty Notifications Start End  Esaw Grandchild, NP PCP - General Family Medicine  10/23/18     Patient Active Problem List   Diagnosis Date Noted  . Constipation 08/14/2019  . Multiple atypical nevi 08/14/2019  . Haglund's deformity of right heel 06/25/2019  . Concussion 06/21/2019  . Acute right ankle pain 03/21/2019  . Hypothyroidism 03/21/2019  . Sore throat 01/23/2019  . Acute otitis media 01/23/2019  . Cough in adult 12/20/2018  . Healthcare maintenance 10/23/2018  . Chronic joint pain 10/23/2018  . Breast cancer screening 10/23/2018  . DDD (degenerative disc disease), lumbar 10/23/2018  . Muscle pain, fibromyalgia 10/23/2018     Past Medical History:  Diagnosis Date  . Arthritis   . Fibromyalgia   . Joint pain      Past Surgical History:  Procedure Laterality Date  . ABDOMINAL HYSTERECTOMY    . APPENDECTOMY    . CHOLECYSTECTOMY    . finger sugery       Family History  Problem Relation Age of Onset  . Cancer Mother   . Heart attack Father   . Hypertension Father   . Diabetes Father   . Birth defects Maternal Grandmother  ovarian  . Depression Maternal Grandmother   . Birth defects Paternal Grandmother        brain     Social History   Substance and Sexual Activity  Drug Use Never     Social History   Substance and Sexual Activity  Alcohol Use Yes   Comment: 4 drinks a year     Social History   Tobacco Use  Smoking Status Never Smoker  Smokeless Tobacco Never Used     Outpatient Encounter Medications as of 08/14/2019  Medication Sig  . ALPRAZolam (XANAX) 0.5 MG tablet Take 0.5  mg by mouth daily as needed.  . baclofen (LIORESAL) 10 MG tablet Take 10 mg by mouth 3 (three) times daily as needed.  . Cholecalciferol (VITAMIN D3) 75 MCG (3000 UT) TABS Take 2,000 Int'l Units by mouth daily.  . DULoxetine (CYMBALTA) 60 MG capsule Take 120 mg by mouth daily.  Marland Kitchen estrogens, conjugated, (PREMARIN) 0.45 MG tablet Take by mouth.  . fluticasone (FLONASE) 50 MCG/ACT nasal spray SHAKE LIQUID AND USE 1 SPRAY IN EACH NOSTRIL DAILY  . gabapentin (NEURONTIN) 600 MG tablet Take 1 tablet (600 mg total) by mouth 3 (three) times daily.  Marland Kitchen levothyroxine (SYNTHROID) 50 MCG tablet Take 1 tablet (50 mcg total) by mouth daily.  . meloxicam (MOBIC) 15 MG tablet Take 15 mg by mouth.  . neomycin-polymyxin-hydrocortisone (CORTISPORIN) 3.5-10000-1 OTIC suspension   . nitroGLYCERIN (NITROSTAT) 0.4 MG SL tablet Place under the tongue.  Marland Kitchen oxyCODONE-acetaminophen (PERCOCET) 10-325 MG tablet Take 1 tablet by mouth 2 (two) times daily.  . pilocarpine (SALAGEN) 5 MG tablet Take 5 mg by mouth 3 (three) times daily.   No facility-administered encounter medications on file as of 08/14/2019.     Allergies: Aspirin, Hydrocodone-acetaminophen, Lyrica [pregabalin], and Topiramate  There is no height or weight on file to calculate BMI.  Blood pressure 128/81, pulse 73, temperature 98.3 F (36.8 C), temperature source Oral, SpO2 100 %.  Review of Systems  Constitutional: Positive for fatigue. Negative for activity change, appetite change, chills, diaphoresis, fever and unexpected weight change.  Eyes: Negative for visual disturbance.  Respiratory: Negative for cough, chest tightness, shortness of breath, wheezing and stridor.   Gastrointestinal: Positive for abdominal pain, constipation and nausea. Negative for abdominal distention, anal bleeding, blood in stool, diarrhea and vomiting.  Endocrine: Negative for polydipsia, polyphagia and polyuria.  Genitourinary: Negative for difficulty urinating and flank  pain.  Musculoskeletal: Positive for arthralgias, back pain, gait problem, joint swelling and myalgias.  Neurological: Positive for weakness. Negative for dizziness and headaches.  Hematological: Negative for adenopathy. Does not bruise/bleed easily.  Psychiatric/Behavioral: Negative for agitation, behavioral problems, confusion, decreased concentration, dysphoric mood, hallucinations, self-injury, sleep disturbance and suicidal ideas. The patient is not nervous/anxious and is not hyperactive.        Objective:   Physical Exam Vitals signs and nursing note reviewed.  Constitutional:      General: She is not in acute distress.    Appearance: She is obese. She is not ill-appearing, toxic-appearing or diaphoretic.  HENT:     Head: Normocephalic and atraumatic.  Cardiovascular:     Rate and Rhythm: Normal rate and regular rhythm.     Pulses: Normal pulses.     Heart sounds: Normal heart sounds. No murmur. No friction rub. No gallop.   Pulmonary:     Effort: Pulmonary effort is normal. No respiratory distress.     Breath sounds: Normal breath sounds. No stridor. No wheezing, rhonchi or  rales.  Chest:     Chest wall: No tenderness.  Musculoskeletal:     Right lower leg: Edema present.     Left lower leg: Edema present.  Skin:    General: Skin is warm and dry.     Capillary Refill: Capillary refill takes less than 2 seconds.  Neurological:     Mental Status: She is alert and oriented to person, place, and time.  Psychiatric:        Mood and Affect: Mood normal.        Behavior: Behavior normal.        Thought Content: Thought content normal.        Judgment: Judgment normal.        Assessment & Plan:   1. Hypothyroidism, unspecified type   2. Elevated LDL cholesterol level   3. Fatigue, unspecified type   4. Neuropathy   5. Vitamin D deficiency   6. Atypical nevi   7. Screening for colon cancer   8. Healthcare maintenance   9. Constipation, unspecified constipation type    10. Multiple atypical nevi   11. Muscle pain, fibromyalgia     Healthcare maintenance Continue all medications as directed. Remain well hydrated, follow Mediterranean diet, increase fiber to help with constipation. Referral to Gastroenterology placed- chronic constipation, abdominal pain with eating, and need Colonoscopy updated. Referral to Dermatology placed, re: atypical moles. We will contact you when lab results are available. Continue close follow-up with your various specialists. Remain as active as possible. Continue to social distance and wear a mask when in public.  Hypothyroidism Increase in fatigue Thyroid panel ordered today   Constipation Likely opioid induced Remain well hydrated, increase fiber in diet Past due on colonoscopy- referral to GI placed   Multiple atypical nevi Referral to Derm placed   Muscle pain, fibromyalgia Followed by Rheum and Pain Mgt She requested to have CRP drawn today Explained that we only order tests if it directs care and we don't manage those conditions. If she is concerned- then request from these providers.     FOLLOW-UP:  Return in about 3 months (around 11/14/2019) for Regular Follow Up, Hypercholestermia, Obesity.

## 2019-08-14 ENCOUNTER — Encounter: Payer: Self-pay | Admitting: Gastroenterology

## 2019-08-14 ENCOUNTER — Other Ambulatory Visit: Payer: Self-pay

## 2019-08-14 ENCOUNTER — Ambulatory Visit (INDEPENDENT_AMBULATORY_CARE_PROVIDER_SITE_OTHER): Payer: Federal, State, Local not specified - PPO | Admitting: Adult Health

## 2019-08-14 ENCOUNTER — Encounter: Payer: Self-pay | Admitting: Adult Health

## 2019-08-14 VITALS — BP 128/81 | HR 73 | Temp 98.3°F

## 2019-08-14 DIAGNOSIS — E039 Hypothyroidism, unspecified: Secondary | ICD-10-CM | POA: Diagnosis not present

## 2019-08-14 DIAGNOSIS — E559 Vitamin D deficiency, unspecified: Secondary | ICD-10-CM | POA: Diagnosis not present

## 2019-08-14 DIAGNOSIS — R5383 Other fatigue: Secondary | ICD-10-CM | POA: Diagnosis not present

## 2019-08-14 DIAGNOSIS — E7800 Pure hypercholesterolemia, unspecified: Secondary | ICD-10-CM | POA: Diagnosis not present

## 2019-08-14 DIAGNOSIS — G629 Polyneuropathy, unspecified: Secondary | ICD-10-CM | POA: Diagnosis not present

## 2019-08-14 DIAGNOSIS — Z1211 Encounter for screening for malignant neoplasm of colon: Secondary | ICD-10-CM

## 2019-08-14 DIAGNOSIS — K5900 Constipation, unspecified: Secondary | ICD-10-CM | POA: Insufficient documentation

## 2019-08-14 DIAGNOSIS — D229 Melanocytic nevi, unspecified: Secondary | ICD-10-CM | POA: Insufficient documentation

## 2019-08-14 DIAGNOSIS — Z0000 Encounter for general adult medical examination without abnormal findings: Secondary | ICD-10-CM

## 2019-08-14 DIAGNOSIS — M2550 Pain in unspecified joint: Secondary | ICD-10-CM | POA: Diagnosis not present

## 2019-08-14 DIAGNOSIS — M797 Fibromyalgia: Secondary | ICD-10-CM | POA: Diagnosis not present

## 2019-08-14 NOTE — Assessment & Plan Note (Signed)
Increase in fatigue Thyroid panel ordered today

## 2019-08-14 NOTE — Assessment & Plan Note (Signed)
Continue all medications as directed. Remain well hydrated, follow Mediterranean diet, increase fiber to help with constipation. Referral to Gastroenterology placed- chronic constipation, abdominal pain with eating, and need Colonoscopy updated. Referral to Dermatology placed, re: atypical moles. We will contact you when lab results are available. Continue close follow-up with your various specialists. Remain as active as possible. Continue to social distance and wear a mask when in public.

## 2019-08-14 NOTE — Assessment & Plan Note (Signed)
Likely opioid induced Remain well hydrated, increase fiber in diet Past due on colonoscopy- referral to GI placed

## 2019-08-14 NOTE — Assessment & Plan Note (Addendum)
Followed by Rheum and Pain Mgt She requested to have CRP drawn today Explained that we only order tests if it directs care and we don't manage those conditions. If she is concerned- then request from these providers.

## 2019-08-14 NOTE — Assessment & Plan Note (Signed)
Referral to Derm placed.

## 2019-08-14 NOTE — Patient Instructions (Signed)
Mediterranean Diet A Mediterranean diet refers to food and lifestyle choices that are based on the traditions of countries located on the [employer]. This way of eating has been shown to help prevent certain conditions and improve outcomes for people who have chronic diseases, like kidney disease and heart disease. What are tips for following this plan? Lifestyle  Cook and eat meals together with your family, when possible.  Drink enough fluid to keep your urine clear or pale yellow.  Be physically active every day. This includes: ? Aerobic exercise like running or swimming. ? Leisure activities like gardening, walking, or housework.  Get 7-8 hours of sleep each night.  If recommended by your health care provider, drink red wine in moderation. This means 1 glass a day for nonpregnant women and 2 glasses a day for men. A glass of wine equals 5 oz (150 mL). Reading food labels   Check the serving size of packaged foods. For foods such as rice and pasta, the serving size refers to the amount of cooked product, not dry.  Check the total fat in packaged foods. Avoid foods that have saturated fat or trans fats.  Check the ingredients list for added sugars, such as corn syrup. Shopping  At the grocery store, buy most of your food from the areas near the walls of the store. This includes: ? Fresh fruits and vegetables (produce). ? Grains, beans, nuts, and seeds. Some of these may be available in unpackaged forms or large amounts (in bulk). ? Fresh seafood. ? Poultry and eggs. ? Low-fat dairy products.  Buy whole ingredients instead of prepackaged foods.  Buy fresh fruits and vegetables in-season from local farmers markets.  Buy frozen fruits and vegetables in resealable bags.  If you do not have access to quality fresh seafood, buy precooked frozen shrimp or canned fish, such as tuna, salmon, or sardines.  Buy small amounts of raw or cooked vegetables, salads, or olives from  the deli or salad bar at your store.  Stock your pantry so you always have certain foods on hand, such as olive oil, canned tuna, canned tomatoes, rice, pasta, and beans. Cooking  Cook foods with extra-virgin olive oil instead of using butter or other vegetable oils.  Have meat as a side dish, and have vegetables or grains as your main dish. This means having meat in small portions or adding small amounts of meat to foods like pasta or stew.  Use beans or vegetables instead of meat in common dishes like chili or lasagna.  Experiment with different cooking methods. Try roasting or broiling vegetables instead of steaming or sauteing them.  Add frozen vegetables to soups, stews, pasta, or rice.  Add nuts or seeds for added healthy fat at each meal. You can add these to yogurt, salads, or vegetable dishes.  Marinate fish or vegetables using olive oil, lemon juice, garlic, and fresh herbs. Meal planning   Plan to eat 1 vegetarian meal one day each week. Try to work up to 2 vegetarian meals, if possible.  Eat seafood 2 or more times a week.  Have healthy snacks readily available, such as: ? Vegetable sticks with hummus. ? [place] yogurt. ? Fruit and nut trail mix.  Eat balanced meals throughout the week. This includes: ? Fruit: 2-3 servings a day ? Vegetables: 4-5 servings a day ? Low-fat dairy: 2 servings a day ? Fish, poultry, or lean meat: 1 serving a day ? Beans and legumes: 2 or more servings a week ?  Nuts and seeds: 1-2 servings a day ? Whole grains: 6-8 servings a day ? Extra-virgin olive oil: 3-4 servings a day  Limit red meat and sweets to only a few servings a month What are my food choices?  Mediterranean diet ? Recommended  Grains: Whole-grain pasta. Brown rice. Bulgar wheat. Polenta. Couscous. Whole-wheat bread. [place].  Vegetables: Artichokes. Beets. Broccoli. Cabbage. Carrots. Eggplant. Green beans. Chard. Kale. Spinach. Onions. Leeks. Peas. Squash.  Tomatoes. Peppers. Radishes.  Fruits: Apples. Apricots. Avocado. Berries. Bananas. Cherries. Dates. Figs. Grapes. Lemons. Melon. Oranges. Peaches. Plums. Pomegranate.  Meats and other protein foods: Beans. Almonds. Sunflower seeds. Pine nuts. Peanuts. [place]. Salmon. Scallops. Shrimp. [place]. Tilapia. Clams. Oysters. Eggs.  Dairy: Low-fat milk. Cheese. Greek yogurt.  Beverages: Water. Red wine. Herbal tea.  Fats and oils: Extra virgin olive oil. Avocado oil. Grape seed oil.  Sweets and desserts: [place] yogurt with honey. Baked apples. Poached pears. Trail mix.  Seasoning and other foods: Basil. Cilantro. Coriander. Cumin. Mint. Parsley. Sage. Rosemary. Tarragon. Garlic. Oregano. Thyme. Pepper. Balsalmic vinegar. Tahini. Hummus. Tomato sauce. Olives. Mushrooms. ? Limit these  Grains: Prepackaged pasta or rice dishes. Prepackaged cereal with added sugar.  Vegetables: Deep fried potatoes (french fries).  Fruits: Fruit canned in syrup.  Meats and other protein foods: Beef. Pork. Lamb. Poultry with skin. Hot dogs. [place] Salines.  Dairy: Ice cream. Sour cream. Whole milk.  Beverages: Juice. Sugar-sweetened soft drinks. Beer. Liquor and spirits.  Fats and oils: Butter. Canola oil. Vegetable oil. Beef fat (tallow). Lard.  Sweets and desserts: Cookies. Cakes. Pies. Candy.  Seasoning and other foods: Mayonnaise. Premade sauces and marinades. The items listed may not be a complete list. Talk with your dietitian about what dietary choices are right for you. Summary  The Mediterranean diet includes both food and lifestyle choices.  Eat a variety of fresh fruits and vegetables, beans, nuts, seeds, and whole grains.  Limit the amount of red meat and sweets that you eat.  Talk with your health care provider about whether it is safe for you to drink red wine in moderation. This means 1 glass a day for nonpregnant women and 2 glasses a day for men. A glass of wine equals 5 oz (150 mL). This information  is not intended to replace advice given to you by your health care provider. Make sure you discuss any questions you have with your health care provider. Document Released: 05/06/2016 Document Revised: 05/13/2016 Document Reviewed: 05/06/2016 Elsevier Patient Education  2020 [place] all medications as directed. Remain well hydrated, follow Mediterranean diet, increase fiber to help with constipation. Referral to Gastroenterology placed- chronic constipation, abdominal pain with eating, and need Colonoscopy updated. Referral to Dermatology placed, re: atypical moles. We will contact you when lab results are available. Continue close follow-up with your various specialists. Remain as active as possible. Continue to social distance and wear a mask when in public. Follow-up 3 months.

## 2019-08-15 ENCOUNTER — Other Ambulatory Visit: Payer: Self-pay | Admitting: Adult Health

## 2019-08-15 ENCOUNTER — Encounter: Payer: Self-pay | Admitting: Physician Assistant

## 2019-08-15 ENCOUNTER — Ambulatory Visit (INDEPENDENT_AMBULATORY_CARE_PROVIDER_SITE_OTHER): Payer: Federal, State, Local not specified - PPO | Admitting: Physician Assistant

## 2019-08-15 VITALS — Ht 65.0 in | Wt 270.0 lb

## 2019-08-15 DIAGNOSIS — Z79899 Other long term (current) drug therapy: Secondary | ICD-10-CM

## 2019-08-15 DIAGNOSIS — E7800 Pure hypercholesterolemia, unspecified: Secondary | ICD-10-CM

## 2019-08-15 DIAGNOSIS — M9261 Juvenile osteochondrosis of tarsus, right ankle: Secondary | ICD-10-CM

## 2019-08-15 LAB — T3: T3, Total: 104 ng/dL (ref 71–180)

## 2019-08-15 LAB — TSH: TSH: 2.58 u[IU]/mL (ref 0.450–4.500)

## 2019-08-15 LAB — LDL CHOLESTEROL, DIRECT: LDL Direct: 146 mg/dL — ABNORMAL HIGH (ref 0–99)

## 2019-08-15 LAB — VITAMIN B12: Vitamin B-12: 861 pg/mL (ref 232–1245)

## 2019-08-15 LAB — VITAMIN D 25 HYDROXY (VIT D DEFICIENCY, FRACTURES): Vit D, 25-Hydroxy: 38.3 ng/mL (ref 30.0–100.0)

## 2019-08-15 LAB — T4, FREE: Free T4: 1.04 ng/dL (ref 0.82–1.77)

## 2019-08-15 MED ORDER — ATORVASTATIN CALCIUM 10 MG PO TABS: 10.0000 mg | ORAL_TABLET | Freq: Every day | ORAL | 0 refills | Status: DC

## 2019-08-15 NOTE — Progress Notes (Signed)
Office Visit Note   Patient: Pamela Esparza           Date of Birth: [birth year]           MRN: [id number] Visit Date: 08/15/2019              Requested by: Esaw Grandchild, NP [place],  [place] [id number] PCP: Esaw Grandchild, NP   Assessment & Plan: Visit Diagnoses:  1. Haglund's deformity of right heel     Plan:  We will have her continue the cam walker boot for another week and then try to slowly transition to regular shoe on 916 heel with the right.  Continue to use Voltaren gel 4 times daily to the Achilles insertion region.  In 4 weeks she will follow-up with Dr. Sharol Given for evaluation of the Haglund's deformity.  Questions were encouraged and answered at length.  Follow-Up Instructions: Return Dr. Sharol Given in 4 weeks.   Orders:  No orders of the defined types were placed in this encounter.  No orders of the defined types were placed in this encounter.     Procedures: No procedures performed   Clinical Data: No additional findings.   Subjective: Chief Complaint  Patient presents with  . Right Ankle - Follow-up    HPI [name] returns today follow-up for her right Haglund's deformity.  She states she still having pain in the heel she is doing on physical therapy.  She uses Voltaren gel twice daily.  She had a cam walker boot for the last week.  She does not have as much pain whenever she is in the boot but if she tries to walk without the boot she definitely has pain posterior aspect of her right heel.  Review of Systems See HPI otherwise negative.  Objective: Vital Signs: Ht 5\' 5"  (1.651 m)   Wt 270 lb (122.5 kg)   BMI 44.93 kg/m   Physical Exam Constitutional:      Appearance: She is not ill-appearing or diaphoretic.  Pulmonary:     Effort: Pulmonary effort is normal.  Neurological:     Mental Status: She is alert and oriented to person, place, and time.     Ortho Exam Tenderness over the distal Achilles tendon and insertion  region.  No abnormal warmth erythema.  Otherwise foot is neurovascular intact. Specialty Comments:  No specialty comments available.  Imaging: No results found.   PMFS History: Patient Active Problem List   Diagnosis Date Noted  . Constipation 08/14/2019  . Multiple atypical nevi 08/14/2019  . Haglund's deformity of right heel 06/25/2019  . Concussion 06/21/2019  . Acute right ankle pain 03/21/2019  . Hypothyroidism 03/21/2019  . Sore throat 01/23/2019  . Acute otitis media 01/23/2019  . Cough in adult 12/20/2018  . Healthcare maintenance 10/23/2018  . Chronic joint pain 10/23/2018  . Breast cancer screening 10/23/2018  . DDD (degenerative disc disease), lumbar 10/23/2018  . Muscle pain, fibromyalgia 10/23/2018   Past Medical History:  Diagnosis Date  . Arthritis   . Fibromyalgia   . Joint pain     Family History  Problem Relation Age of Onset  . Cancer Mother   . Heart attack Father   . Hypertension Father   . Diabetes Father   . Birth defects Maternal Grandmother        ovarian  . Depression Maternal Grandmother   . Birth defects Paternal Grandmother        brain  Past Surgical History:  Procedure Laterality Date  . ABDOMINAL HYSTERECTOMY    . APPENDECTOMY    . CHOLECYSTECTOMY    . finger sugery     Social History   Occupational History  . Not on file  Tobacco Use  . Smoking status: Never Smoker  . Smokeless tobacco: Never Used  Substance and Sexual Activity  . Alcohol use: Yes    Comment: 4 drinks a year  . Drug use: Never  . Sexual activity: Not Currently

## 2019-08-27 ENCOUNTER — Other Ambulatory Visit: Payer: Self-pay

## 2019-08-27 ENCOUNTER — Ambulatory Visit (AMBULATORY_SURGERY_CENTER): Payer: Federal, State, Local not specified - PPO | Admitting: *Deleted

## 2019-08-27 VITALS — Temp 97.1°F | Ht 65.0 in | Wt 280.0 lb

## 2019-08-27 DIAGNOSIS — Z1159 Encounter for screening for other viral diseases: Secondary | ICD-10-CM

## 2019-08-27 DIAGNOSIS — Z1211 Encounter for screening for malignant neoplasm of colon: Secondary | ICD-10-CM

## 2019-08-27 DIAGNOSIS — D225 Melanocytic nevi of trunk: Secondary | ICD-10-CM | POA: Diagnosis not present

## 2019-08-27 DIAGNOSIS — D485 Neoplasm of uncertain behavior of skin: Secondary | ICD-10-CM | POA: Diagnosis not present

## 2019-08-27 MED ORDER — NA SULFATE-K SULFATE-MG SULF 17.5-3.13-1.6 GM/177ML PO SOLN: ORAL | 0 refills | Status: DC

## 2019-08-27 NOTE — Progress Notes (Signed)
Patient is here in-person for PV. Patient denies any allergies to eggs or soy. Patient denies any problems with anesthesia/sedation. Patient denies any oxygen use at home. Patient denies taking any diet/weight loss medications or blood thinners. Patient is not being treated for MRSA or C-diff. EMMI education assisgned to the patient for the procedure, this was explained and instructions given to patient. COVID-19 screening test is on 12/2 910am, the pt is aware. Pt is aware that care partner will wait in the car during procedure; if they feel like they will be too hot or cold to wait in the car; they may wait in the 4 th floor lobby. Patient is aware to bring only one care partner. We want them to wear a mask (we do not have any that we can provide them), practice social distancing, and we will check their temperatures when they get here.  I did remind the patient that their care partner needs to stay in the parking lot the entire time and have a cell phone available, we will call them when the pt is ready for discharge. Patient will wear mask into building.    2 day prep given due to Constipation.  Suprep $15 off coupon given to the patient.

## 2019-08-29 ENCOUNTER — Ambulatory Visit (INDEPENDENT_AMBULATORY_CARE_PROVIDER_SITE_OTHER): Payer: Federal, State, Local not specified - PPO

## 2019-08-29 DIAGNOSIS — Z1159 Encounter for screening for other viral diseases: Secondary | ICD-10-CM | POA: Diagnosis not present

## 2019-08-30 LAB — SARS CORONAVIRUS 2 (TAT 6-24 HRS): SARS Coronavirus 2: NEGATIVE

## 2019-09-03 ENCOUNTER — Encounter: Payer: Self-pay | Admitting: Gastroenterology

## 2019-09-03 ENCOUNTER — Other Ambulatory Visit: Payer: Self-pay

## 2019-09-03 ENCOUNTER — Ambulatory Visit (AMBULATORY_SURGERY_CENTER): Payer: Federal, State, Local not specified - PPO | Admitting: Gastroenterology

## 2019-09-03 VITALS — BP 122/63 | HR 60 | Temp 98.2°F | Resp 11 | Ht 65.0 in | Wt 280.0 lb

## 2019-09-03 DIAGNOSIS — D122 Benign neoplasm of ascending colon: Secondary | ICD-10-CM

## 2019-09-03 DIAGNOSIS — D120 Benign neoplasm of cecum: Secondary | ICD-10-CM

## 2019-09-03 DIAGNOSIS — Z1211 Encounter for screening for malignant neoplasm of colon: Secondary | ICD-10-CM | POA: Diagnosis not present

## 2019-09-03 DIAGNOSIS — D123 Benign neoplasm of transverse colon: Secondary | ICD-10-CM

## 2019-09-03 MED ORDER — SODIUM CHLORIDE 0.9 % IV SOLN: 500.0000 mL | Freq: Once | INTRAVENOUS | Status: DC

## 2019-09-03 NOTE — Op Note (Signed)
[facility] Patient Name: Pamela Esparza Procedure Date: 09/03/2019 2:21 PM MRN: [id number] Endoscopist: Thornton Park MD, MD Age: 53 Referring MD:  Date of Birth: 11/30/1965 Gender: Female Account #: [id number] Procedure:                Colonoscopy Indications:              Screening for colorectal malignant neoplasm                           Colonoscopy > 10 years ago in [place]                           No known family history of colon cancer or polyps Medicines:                Monitored Anesthesia Care Procedure:                Pre-Anesthesia Assessment:                           - Prior to the procedure, a History and Physical                            was performed, and patient medications and                            allergies were reviewed. The patient's tolerance of                            previous anesthesia was also reviewed. The risks                            and benefits of the procedure and the sedation                            options and risks were discussed with the patient.                            All questions were answered, and informed consent                            was obtained. Prior Anticoagulants: The patient has                            taken no previous anticoagulant or antiplatelet                            agents. ASA Grade Assessment: III - A patient with                            severe systemic disease. After reviewing the risks                            and benefits, the patient was deemed in  satisfactory condition to undergo the procedure.                           After obtaining informed consent, the colonoscope                            was passed under direct vision. Throughout the                            procedure, the patient's blood pressure, pulse, and                            oxygen saturations were monitored continuously. The                            Colonoscope was  introduced through the anus and                            advanced to the the terminal ileum, with                            identification of the appendiceal orifice and IC                            valve. A second forward view of the right colon was                            performed. The colonoscopy was somewhat difficult                            due to significant looping. Successful completion                            of the procedure was aided by applying abdominal                            pressure. The patient tolerated the procedure well.                            The quality of the bowel preparation was good. The                            terminal ileum, ileocecal valve, appendiceal                            orifice, and rectum were photographed. Scope In: 2:32:10 PM Scope Out: 2:46:55 PM Scope Withdrawal Time: 0 hours 11 minutes 50 seconds  Total Procedure Duration: 0 hours 14 minutes 45 seconds  Findings:                 The perianal and digital rectal examinations were                            normal.  A 1 mm polyp was found in the cecum. The polyp was                            sessile. The polyp was removed with a cold snare.                            Resection and retrieval were complete. Estimated                            blood loss: none.                           A 12 mm polyp was found in the ascending colon. The                            polyp was sessile. The polyp was removed with a                            cold snare. Resection and retrieval were complete.                            Estimated blood loss was minimal.                           A 10 mm polyp was found in the proximal transverse                            colon. The polyp was sessile. The polyp was removed                            with a cold snare. Resection and retrieval were                            complete. Estimated blood loss was minimal.                            The exam was otherwise without abnormality on                            direct and retroflexion views. Complications:            No immediate complications. Estimated blood loss:                            Minimal. Estimated Blood Loss:     Estimated blood loss was minimal. Impression:               - One 1 mm polyp in the cecum, removed with a cold                            snare. Resected and retrieved.                           - One 12  mm polyp in the ascending colon, removed                            with a cold snare. Resected and retrieved.                           - One 10 mm polyp in the proximal transverse colon,                            removed with a cold snare. Resected and retrieved.                           - The examination was otherwise normal on direct                            and retroflexion views. Recommendation:           - Patient has a contact number available for                            emergencies. The signs and symptoms of potential                            delayed complications were discussed with the                            patient. Return to normal activities tomorrow.                            Written discharge instructions were provided to the                            patient.                           - Resume previous diet.                           - Continue present medications.                           - Await pathology results.                           - Repeat colonoscopy date to be determined after                            pending pathology results are reviewed for                            surveillance. Thornton Park MD, MD 09/03/2019 2:56:13 PM This report has been signed electronically.

## 2019-09-03 NOTE — Patient Instructions (Signed)
HANDOUTS PROVIDED ON: POLYPS   THE POLYPS TAKEN TODAY HAVE BEEN SENT FOR PATHOLOGY.  THE RESULTS CAN TAKE 2-3 WEEKS TO RECEIVE.  BASED ON THE RESULTS IS WHEN YOUR NEXT COLONOSCOPY WILL BE RECOMMENDED.  YOU MAY RESUME YOUR PREVIOUS DIET AND MEDICATION SCHEDULE.  [place] YOU FOR ALLOWING [place] TO CARE FOR YOU TODAY!!!  YOU HAD AN ENDOSCOPIC PROCEDURE TODAY AT [facility]:   Refer to the procedure report that was given to you for any specific questions about what was found during the examination.  If the procedure report does not answer your questions, please call your gastroenterologist to clarify.  If you requested that your care partner not be given the details of your procedure findings, then the procedure report has been included in a sealed envelope for you to review at your convenience later.  YOU SHOULD EXPECT: Some feelings of bloating in the abdomen. Passage of more gas than usual.  Walking can help get rid of the air that was put into your GI tract during the procedure and reduce the bloating. If you had a lower endoscopy (such as a colonoscopy or flexible sigmoidoscopy) you may notice spotting of blood in your stool or on the toilet paper. If you underwent a bowel prep for your procedure, you may not have a normal bowel movement for a few days.  Please Note:  You might notice some irritation and congestion in your nose or some drainage.  This is from the oxygen used during your procedure.  There is no need for concern and it should clear up in a day or so.  SYMPTOMS TO REPORT IMMEDIATELY:   Following lower endoscopy (colonoscopy or flexible sigmoidoscopy):  Excessive amounts of blood in the stool  Significant tenderness or worsening of abdominal pains  Swelling of the abdomen that is new, acute  Fever of 100F or higher  For urgent or emergent issues, a gastroenterologist can be reached at any hour by calling [phone].   DIET:  We do recommend a small meal at  first, but then you may proceed to your regular diet.  Drink plenty of fluids but you should avoid alcoholic beverages for 24 hours.  ACTIVITY:  You should plan to take it easy for the rest of today and you should NOT DRIVE or use heavy machinery until tomorrow (because of the sedation medicines used during the test).    FOLLOW UP: Our staff will call the number listed on your records 48-72 hours following your procedure to check on you and address any questions or concerns that you may have regarding the information given to you following your procedure. If we do not reach you, we will leave a message.  We will attempt to reach you two times.  During this call, we will ask if you have developed any symptoms of COVID 19. If you develop any symptoms (ie: fever, flu-like symptoms, shortness of breath, cough etc.) before then, please call [phone].  If you test positive for Covid 19 in the 2 weeks post procedure, please call and report this information to [place].    If any biopsies were taken you will be contacted by phone or by letter within the next 1-3 weeks.  Please call us at [phone] if you have not heard about the biopsies in 3 weeks.    SIGNATURES/CONFIDENTIALITY: You and/or your care partner have signed paperwork which will be entered into your electronic medical record.  These signatures attest to the fact that  that the information above on your After Visit Summary has been reviewed and is understood.  Full responsibility of the confidentiality of this discharge information lies with you and/or your care-partner.

## 2019-09-03 NOTE — Progress Notes (Signed)
Pt's states no medical or surgical changes since previsit or office visit.  LC temps, CW vitals and SS IV.

## 2019-09-03 NOTE — Progress Notes (Signed)
Report given to PACU, vss 

## 2019-09-05 ENCOUNTER — Telehealth: Payer: Self-pay | Admitting: *Deleted

## 2019-09-05 ENCOUNTER — Telehealth: Payer: Self-pay

## 2019-09-05 NOTE — Telephone Encounter (Signed)
  Follow up Call-  Call back number 09/03/2019  Post procedure Call Back phone  # [phone]  Permission to leave phone message Yes     Patient questions:  Phone rang and we were disconnected.  Second call.

## 2019-09-05 NOTE — Telephone Encounter (Signed)
1st follow up call made.  NA unable to leave message.

## 2019-09-08 ENCOUNTER — Encounter: Payer: Self-pay | Admitting: Gastroenterology

## 2019-09-13 ENCOUNTER — Ambulatory Visit (INDEPENDENT_AMBULATORY_CARE_PROVIDER_SITE_OTHER): Payer: Federal, State, Local not specified - PPO | Admitting: Orthopedic Surgery

## 2019-09-13 ENCOUNTER — Encounter: Payer: Self-pay | Admitting: Orthopedic Surgery

## 2019-09-13 ENCOUNTER — Other Ambulatory Visit: Payer: Self-pay

## 2019-09-13 VITALS — Ht 65.0 in | Wt 280.0 lb

## 2019-09-13 DIAGNOSIS — M7671 Peroneal tendinitis, right leg: Secondary | ICD-10-CM | POA: Diagnosis not present

## 2019-09-14 ENCOUNTER — Encounter: Payer: Self-pay | Admitting: Orthopedic Surgery

## 2019-09-14 NOTE — Progress Notes (Signed)
Office Visit Note   Patient: Pamela Esparza           Date of Birth: 18-Apr-1966           MRN: [id number] Visit Date: 09/13/2019              Requested by: Esaw Grandchild, NP [address] [place],   [id number] PCP: Esaw Grandchild, NP  Chief Complaint  Patient presents with  . Right Foot - Pain, New Patient (Initial Visit)      HPI: Patient is a 53 year old woman who was seen for initial evaluation for a Haglund's deformity on the right.  Patient complains of global pain around the right hindfoot.  She has pain with walking swelling she has been to physical therapy she has used steroid cream without relief.  Past medical history negative for diabetes.  Assessment & Plan: Visit Diagnoses:  1. Peroneal tendinitis, right leg     Plan: We will obtain an MRI scan to further evaluate her peroneal tendons.  Patient's symptoms seem to be primarily coming from the peroneus brevis.  Will reevaluate after the MRI scan and see if surgical intervention is indicated.  Follow-Up Instructions: Return in about 2 weeks (around 09/27/2019) for Follow-up after MRI.   Ortho Exam  Patient is alert, oriented, no adenopathy, well-dressed, normal affect, normal respiratory effort. Examination patient has a good dorsalis pedis pulse she has good subtalar and ankle range of motion she currently uses a cane for ambulation.  Examination patient has minimal tenderness to palpation over the Achilles and Haglund's deformity.  Radiographs were reviewed which shows calcification of the insertion of the Achilles with a large Haglund's deformity.  She has ankle dorsiflexion about 20 degrees.  The Achilles tendon is not tender to palpation.  She is point tender to palpation of the origin of the plantar fascia lateral compression of the calcaneus is nontender.  She has some mild tenderness to palpation over the posterior tibial tendon.  Patient is most symptomatic with swelling and tenderness to palpation  of the peroneal tendons with pain reproduced with resisted eversion.  Imaging: No results found. No images are attached to the encounter.  Labs: Lab Results  Component Value Date   HGBA1C 5.4 03/21/2019     Lab Results  Component Value Date   ALBUMIN 4.4 03/21/2019    No results found for: MG Lab Results  Component Value Date   VD25OH 38.3 08/14/2019    No results found for: PREALBUMIN CBC EXTENDED Latest Ref Rng & Units 03/21/2019  WBC 3.4 - 10.8 x10E3/uL 7.0  RBC 3.77 - 5.28 x10E6/uL 4.61  HGB 11.1 - 15.9 g/dL 14.0  HCT 34.0 - 46.6 % 41.6  PLT 150 - 450 x10E3/uL 308  NEUTROABS 1.4 - 7.0 x10E3/uL 3.8  LYMPHSABS 0.7 - 3.1 x10E3/uL 2.5     Body mass index is 46.59 kg/m.  Orders:  Orders Placed This Encounter  Procedures  . MR Ankle Right w/o contrast   No orders of the defined types were placed in this encounter.    Procedures: No procedures performed  Clinical Data: No additional findings.  ROS:  All other systems negative, except as noted in the HPI. Review of Systems  Objective: Vital Signs: Ht 5\' 5"  (1.651 m)   Wt 280 lb (127 kg)   BMI 46.59 kg/m   Specialty Comments:  No specialty comments available.  PMFS History: Patient Active Problem List   Diagnosis Date Noted  .  Constipation 08/14/2019  . Multiple atypical nevi 08/14/2019  . Haglund's deformity of right heel 06/25/2019  . Concussion 06/21/2019  . Acute right ankle pain 03/21/2019  . Hypothyroidism 03/21/2019  . Sore throat 01/23/2019  . Acute otitis media 01/23/2019  . Cough in adult 12/20/2018  . Healthcare maintenance 10/23/2018  . Chronic joint pain 10/23/2018  . Breast cancer screening 10/23/2018  . DDD (degenerative disc disease), lumbar 10/23/2018  . Muscle pain, fibromyalgia 10/23/2018   Past Medical History:  Diagnosis Date  . Anxiety   . Arthritis   . Asthma   . Cataract   . DDD (degenerative disc disease), lumbar   . Depression   . Fibromyalgia   .  Hyperlipidemia   . Joint pain   . Neuropathy   . Thyroid disease     Family History  Problem Relation Age of Onset  . Cancer Mother   . Heart attack Father   . Hypertension Father   . Diabetes Father   . Birth defects Maternal Grandmother        ovarian  . Depression Maternal Grandmother   . Birth defects Paternal Grandmother        brain  . Colon cancer Neg Hx   . Colon polyps Neg Hx   . Esophageal cancer Neg Hx   . Stomach cancer Neg Hx   . Rectal cancer Neg Hx     Past Surgical History:  Procedure Laterality Date  . ABDOMINAL HYSTERECTOMY    . APPENDECTOMY    . CHOLECYSTECTOMY    . finger sugery    . NECK SURGERY     Social History   Occupational History  . Not on file  Tobacco Use  . Smoking status: Never Smoker  . Smokeless tobacco: Never Used  Substance and Sexual Activity  . Alcohol use: Yes    Comment: 4 drinks a year-rare  . Drug use: Never  . Sexual activity: Not Currently

## 2019-09-24 DIAGNOSIS — M961 Postlaminectomy syndrome, not elsewhere classified: Secondary | ICD-10-CM | POA: Diagnosis not present

## 2019-09-24 DIAGNOSIS — M47812 Spondylosis without myelopathy or radiculopathy, cervical region: Secondary | ICD-10-CM | POA: Diagnosis not present

## 2019-09-24 DIAGNOSIS — Z79891 Long term (current) use of opiate analgesic: Secondary | ICD-10-CM | POA: Diagnosis not present

## 2019-09-24 DIAGNOSIS — M533 Sacrococcygeal disorders, not elsewhere classified: Secondary | ICD-10-CM | POA: Diagnosis not present

## 2019-09-24 DIAGNOSIS — M170 Bilateral primary osteoarthritis of knee: Secondary | ICD-10-CM | POA: Diagnosis not present

## 2019-09-24 DIAGNOSIS — M47816 Spondylosis without myelopathy or radiculopathy, lumbar region: Secondary | ICD-10-CM | POA: Diagnosis not present

## 2019-09-24 DIAGNOSIS — G588 Other specified mononeuropathies: Secondary | ICD-10-CM | POA: Diagnosis not present

## 2019-09-24 DIAGNOSIS — M5136 Other intervertebral disc degeneration, lumbar region: Secondary | ICD-10-CM | POA: Diagnosis not present

## 2019-09-24 DIAGNOSIS — M4802 Spinal stenosis, cervical region: Secondary | ICD-10-CM | POA: Diagnosis not present

## 2019-09-24 DIAGNOSIS — M5030 Other cervical disc degeneration, unspecified cervical region: Secondary | ICD-10-CM | POA: Diagnosis not present

## 2019-09-24 DIAGNOSIS — M461 Sacroiliitis, not elsewhere classified: Secondary | ICD-10-CM | POA: Diagnosis not present

## 2019-09-24 DIAGNOSIS — M791 Myalgia, unspecified site: Secondary | ICD-10-CM | POA: Diagnosis not present

## 2019-09-24 DIAGNOSIS — G8929 Other chronic pain: Secondary | ICD-10-CM | POA: Diagnosis not present

## 2019-09-26 ENCOUNTER — Other Ambulatory Visit: Payer: Federal, State, Local not specified - PPO

## 2019-09-26 ENCOUNTER — Other Ambulatory Visit: Payer: Self-pay

## 2019-09-26 DIAGNOSIS — E7800 Pure hypercholesterolemia, unspecified: Secondary | ICD-10-CM

## 2019-09-26 DIAGNOSIS — Z79899 Other long term (current) drug therapy: Secondary | ICD-10-CM

## 2019-09-27 LAB — LIPID PANEL
Chol/HDL Ratio: 2.2 ratio (ref 0.0–4.4)
Cholesterol, Total: 186 mg/dL (ref 100–199)
HDL: 86 mg/dL (ref >39)
LDL Chol Calc (NIH): 89 mg/dL (ref 0–99)
Triglycerides: 57 mg/dL (ref 0–149)
VLDL Cholesterol Cal: 11 mg/dL (ref 5–40)

## 2019-09-27 LAB — HEPATIC FUNCTION PANEL
ALT: 24 IU/L (ref 0–32)
AST: 22 IU/L (ref 0–40)
Albumin: 4.5 g/dL (ref 3.8–4.9)
Alkaline Phosphatase: 115 IU/L (ref 39–117)
Bilirubin Total: 0.5 mg/dL (ref 0.0–1.2)
Bilirubin, Direct: 0.16 mg/dL (ref 0.00–0.40)
Total Protein: 7.1 g/dL (ref 6.0–8.5)

## 2019-09-29 ENCOUNTER — Other Ambulatory Visit: Payer: Self-pay

## 2019-09-29 ENCOUNTER — Ambulatory Visit
Admission: RE | Admit: 2019-09-29 | Discharge: 2019-09-29 | Disposition: A | Discharge status: Home / Self Care | Payer: Federal, State, Local not specified - PPO | Source: Ambulatory Visit | Attending: Orthopedic Surgery | Admitting: Orthopedic Surgery

## 2019-09-29 DIAGNOSIS — S86011A Strain of right Achilles tendon, initial encounter: Secondary | ICD-10-CM | POA: Diagnosis not present

## 2019-09-29 DIAGNOSIS — M7671 Peroneal tendinitis, right leg: Secondary | ICD-10-CM

## 2019-10-02 ENCOUNTER — Ambulatory Visit (INDEPENDENT_AMBULATORY_CARE_PROVIDER_SITE_OTHER): Payer: Federal, State, Local not specified - PPO | Admitting: Orthopedic Surgery

## 2019-10-02 ENCOUNTER — Other Ambulatory Visit: Payer: Self-pay

## 2019-10-02 DIAGNOSIS — M7671 Peroneal tendinitis, right leg: Secondary | ICD-10-CM

## 2019-10-05 ENCOUNTER — Other Ambulatory Visit: Payer: Self-pay

## 2019-10-05 ENCOUNTER — Ambulatory Visit
Admission: RE | Admit: 2019-10-05 | Discharge: 2019-10-05 | Disposition: A | Discharge status: Home / Self Care | Payer: Federal, State, Local not specified - PPO | Source: Ambulatory Visit | Attending: Adult Health | Admitting: Adult Health

## 2019-10-05 DIAGNOSIS — Z1231 Encounter for screening mammogram for malignant neoplasm of breast: Secondary | ICD-10-CM | POA: Diagnosis not present

## 2019-10-05 DIAGNOSIS — Z1239 Encounter for other screening for malignant neoplasm of breast: Secondary | ICD-10-CM

## 2019-10-09 ENCOUNTER — Encounter: Payer: Self-pay | Admitting: Orthopedic Surgery

## 2019-10-09 NOTE — Progress Notes (Signed)
Office Visit Note   Patient: Pamela Esparza           Date of Birth: Feb 25, 1966           MRN: [id number] Visit Date: 10/02/2019              Requested by: Esaw Grandchild, NP [place],  [place] [id number] PCP: Esaw Grandchild, NP  Chief Complaint  Patient presents with  . Right Ankle - Follow-up      HPI: Patient is a 54 year old woman who presents in follow-up for peroneal brevis tendinitis.  She has been in a fracture boot for several weeks and has used an ankle stabilizing orthosis without relief.  She complains of pain and swelling over the peroneal tendons.  She is status post an MRI scan.  Assessment & Plan: Visit Diagnoses:  1. Peroneal tendinitis, right leg     Plan: Patient states that despite conservative treatment she still is symptomatic and would like to proceed with surgical intervention.  Discussed that we would proceed with debridement of the peroneus brevis and reinforcement with the peroneus longus.  Risk and benefits were discussed including persistent pain nonhealing of the incision need for additional surgery.  Patient states she understands wished to proceed at this time.  Follow-Up Instructions: Return in about 2 weeks (around 10/16/2019) for Follow-up 1 to 2 weeks postoperatively.   Ortho Exam  Patient is alert, oriented, no adenopathy, well-dressed, normal affect, normal respiratory effort. Examination patient has a good dorsalis pedis pulses she does have an antalgic gait she uses a cane she has swelling over the peroneal tendons and these are tender to palpation.  Review of the MRI scan shows partial thickness tearing at the insertion of the Achilles there is no full-thickness or retracted tear she has a longitudinal split tear of the peroneus brevis tendon distal to the lateral malleolus there is no complete or retracted tear.  Patient is not tender to palpation over the Achilles tendon or the posterior tibial tendon.  Imaging: No  results found. No images are attached to the encounter.  Labs: Lab Results  Component Value Date   HGBA1C 5.4 03/21/2019     Lab Results  Component Value Date   ALBUMIN 4.5 09/26/2019   ALBUMIN 4.4 03/21/2019    No results found for: MG Lab Results  Component Value Date   VD25OH 38.3 08/14/2019    No results found for: PREALBUMIN CBC EXTENDED Latest Ref Rng & Units 03/21/2019  WBC 3.4 - 10.8 x10E3/uL 7.0  RBC 3.77 - 5.28 x10E6/uL 4.61  HGB 11.1 - 15.9 g/dL 14.0  HCT 34.0 - 46.6 % 41.6  PLT 150 - 450 x10E3/uL 308  NEUTROABS 1.4 - 7.0 x10E3/uL 3.8  LYMPHSABS 0.7 - 3.1 x10E3/uL 2.5     There is no height or weight on file to calculate BMI.  Orders:  No orders of the defined types were placed in this encounter.  No orders of the defined types were placed in this encounter.    Procedures: No procedures performed  Clinical Data: No additional findings.  ROS:  All other systems negative, except as noted in the HPI. Review of Systems  Objective: Vital Signs: There were no vitals taken for this visit.  Specialty Comments:  No specialty comments available.  PMFS History: Patient Active Problem List   Diagnosis Date Noted  . Constipation 08/14/2019  . Multiple atypical nevi 08/14/2019  . Haglund's deformity of right heel 06/25/2019  .  Concussion 06/21/2019  . Acute right ankle pain 03/21/2019  . Hypothyroidism 03/21/2019  . Sore throat 01/23/2019  . Acute otitis media 01/23/2019  . Cough in adult 12/20/2018  . Healthcare maintenance 10/23/2018  . Chronic joint pain 10/23/2018  . Breast cancer screening 10/23/2018  . DDD (degenerative disc disease), lumbar 10/23/2018  . Muscle pain, fibromyalgia 10/23/2018   Past Medical History:  Diagnosis Date  . Anxiety   . Arthritis   . Asthma   . Cataract   . DDD (degenerative disc disease), lumbar   . Depression   . Fibromyalgia   . Hyperlipidemia   . Joint pain   . Neuropathy   . Thyroid disease       Family History  Problem Relation Age of Onset  . Cancer Mother   . Heart attack Father   . Hypertension Father   . Diabetes Father   . Birth defects Maternal Grandmother        ovarian  . Depression Maternal Grandmother   . Birth defects Paternal Grandmother        brain  . Colon cancer Neg Hx   . Colon polyps Neg Hx   . Esophageal cancer Neg Hx   . Stomach cancer Neg Hx   . Rectal cancer Neg Hx     Past Surgical History:  Procedure Laterality Date  . ABDOMINAL HYSTERECTOMY    . APPENDECTOMY    . CHOLECYSTECTOMY    . finger sugery    . NECK SURGERY     Social History   Occupational History  . Not on file  Tobacco Use  . Smoking status: Never Smoker  . Smokeless tobacco: Never Used  Substance and Sexual Activity  . Alcohol use: Yes    Comment: 4 drinks a year-rare  . Drug use: Never  . Sexual activity: Not Currently

## 2019-10-30 ENCOUNTER — Encounter (HOSPITAL_BASED_OUTPATIENT_CLINIC_OR_DEPARTMENT_OTHER): Payer: Self-pay | Admitting: Orthopedic Surgery

## 2019-10-30 ENCOUNTER — Other Ambulatory Visit: Payer: Self-pay

## 2019-11-01 ENCOUNTER — Other Ambulatory Visit: Payer: Self-pay | Admitting: Physician Assistant

## 2019-11-01 NOTE — Progress Notes (Signed)
Notified pt to come pick up Ensure pre surgery drink, pt verbalized understanding.

## 2019-11-02 ENCOUNTER — Other Ambulatory Visit (HOSPITAL_COMMUNITY)
Admission: RE | Admit: 2019-11-02 | Discharge: 2019-11-02 | Disposition: A | Discharge status: Home / Self Care | Payer: Federal, State, Local not specified - PPO | Source: Ambulatory Visit | Attending: Orthopedic Surgery | Admitting: Orthopedic Surgery

## 2019-11-02 DIAGNOSIS — Z20822 Contact with and (suspected) exposure to covid-19: Secondary | ICD-10-CM | POA: Diagnosis not present

## 2019-11-02 DIAGNOSIS — Z01812 Encounter for preprocedural laboratory examination: Secondary | ICD-10-CM | POA: Diagnosis not present

## 2019-11-02 LAB — SARS CORONAVIRUS 2 (TAT 6-24 HRS): SARS Coronavirus 2: NEGATIVE

## 2019-11-02 NOTE — Progress Notes (Signed)

## 2019-11-05 ENCOUNTER — Encounter (HOSPITAL_BASED_OUTPATIENT_CLINIC_OR_DEPARTMENT_OTHER): Payer: Self-pay | Admitting: Orthopedic Surgery

## 2019-11-05 MED ORDER — DEXTROSE 5 % IV SOLN
3.0000 g | INTRAVENOUS | Status: AC
  Administered 2019-11-06: 3 g via INTRAVENOUS
  Filled 2019-11-05: qty 3000

## 2019-11-06 ENCOUNTER — Encounter (HOSPITAL_BASED_OUTPATIENT_CLINIC_OR_DEPARTMENT_OTHER)
Admission: RE | Disposition: A | Discharge status: Home / Self Care | Payer: Self-pay | Source: Home / Self Care | Attending: Orthopedic Surgery

## 2019-11-06 ENCOUNTER — Ambulatory Visit (HOSPITAL_BASED_OUTPATIENT_CLINIC_OR_DEPARTMENT_OTHER): Payer: Federal, State, Local not specified - PPO | Admitting: Certified Registered"

## 2019-11-06 ENCOUNTER — Encounter (HOSPITAL_BASED_OUTPATIENT_CLINIC_OR_DEPARTMENT_OTHER): Payer: Self-pay | Admitting: Orthopedic Surgery

## 2019-11-06 ENCOUNTER — Other Ambulatory Visit: Payer: Self-pay

## 2019-11-06 ENCOUNTER — Ambulatory Visit (HOSPITAL_BASED_OUTPATIENT_CLINIC_OR_DEPARTMENT_OTHER)
Admission: RE | Admit: 2019-11-06 | Discharge: 2019-11-06 | Disposition: A | Discharge status: Home / Self Care | Payer: Federal, State, Local not specified - PPO | Attending: Orthopedic Surgery | Admitting: Orthopedic Surgery

## 2019-11-06 DIAGNOSIS — Y939 Activity, unspecified: Secondary | ICD-10-CM | POA: Diagnosis not present

## 2019-11-06 DIAGNOSIS — Z6841 Body Mass Index (BMI) 40.0 and over, adult: Secondary | ICD-10-CM | POA: Insufficient documentation

## 2019-11-06 DIAGNOSIS — Z9049 Acquired absence of other specified parts of digestive tract: Secondary | ICD-10-CM | POA: Diagnosis not present

## 2019-11-06 DIAGNOSIS — F329 Major depressive disorder, single episode, unspecified: Secondary | ICD-10-CM | POA: Insufficient documentation

## 2019-11-06 DIAGNOSIS — Z809 Family history of malignant neoplasm, unspecified: Secondary | ICD-10-CM | POA: Diagnosis not present

## 2019-11-06 DIAGNOSIS — S86311A Strain of muscle(s) and tendon(s) of peroneal muscle group at lower leg level, right leg, initial encounter: Secondary | ICD-10-CM | POA: Insufficient documentation

## 2019-11-06 DIAGNOSIS — X58XXXA Exposure to other specified factors, initial encounter: Secondary | ICD-10-CM | POA: Diagnosis not present

## 2019-11-06 DIAGNOSIS — M5136 Other intervertebral disc degeneration, lumbar region: Secondary | ICD-10-CM | POA: Diagnosis not present

## 2019-11-06 DIAGNOSIS — Z888 Allergy status to other drugs, medicaments and biological substances status: Secondary | ICD-10-CM | POA: Insufficient documentation

## 2019-11-06 DIAGNOSIS — E669 Obesity, unspecified: Secondary | ICD-10-CM | POA: Diagnosis not present

## 2019-11-06 DIAGNOSIS — F419 Anxiety disorder, unspecified: Secondary | ICD-10-CM | POA: Diagnosis not present

## 2019-11-06 DIAGNOSIS — Z8489 Family history of other specified conditions: Secondary | ICD-10-CM | POA: Diagnosis not present

## 2019-11-06 DIAGNOSIS — J45909 Unspecified asthma, uncomplicated: Secondary | ICD-10-CM | POA: Insufficient documentation

## 2019-11-06 DIAGNOSIS — Z885 Allergy status to narcotic agent status: Secondary | ICD-10-CM | POA: Insufficient documentation

## 2019-11-06 DIAGNOSIS — E039 Hypothyroidism, unspecified: Secondary | ICD-10-CM | POA: Diagnosis not present

## 2019-11-06 DIAGNOSIS — E785 Hyperlipidemia, unspecified: Secondary | ICD-10-CM | POA: Insufficient documentation

## 2019-11-06 DIAGNOSIS — Z79899 Other long term (current) drug therapy: Secondary | ICD-10-CM | POA: Insufficient documentation

## 2019-11-06 DIAGNOSIS — Z8249 Family history of ischemic heart disease and other diseases of the circulatory system: Secondary | ICD-10-CM | POA: Insufficient documentation

## 2019-11-06 DIAGNOSIS — G629 Polyneuropathy, unspecified: Secondary | ICD-10-CM | POA: Diagnosis not present

## 2019-11-06 DIAGNOSIS — M3500 Sicca syndrome, unspecified: Secondary | ICD-10-CM | POA: Insufficient documentation

## 2019-11-06 DIAGNOSIS — M797 Fibromyalgia: Secondary | ICD-10-CM | POA: Diagnosis not present

## 2019-11-06 DIAGNOSIS — Z833 Family history of diabetes mellitus: Secondary | ICD-10-CM | POA: Diagnosis not present

## 2019-11-06 DIAGNOSIS — Z886 Allergy status to analgesic agent status: Secondary | ICD-10-CM | POA: Insufficient documentation

## 2019-11-06 DIAGNOSIS — Z90710 Acquired absence of both cervix and uterus: Secondary | ICD-10-CM | POA: Insufficient documentation

## 2019-11-06 DIAGNOSIS — M7671 Peroneal tendinitis, right leg: Secondary | ICD-10-CM

## 2019-11-06 DIAGNOSIS — F1120 Opioid dependence, uncomplicated: Secondary | ICD-10-CM | POA: Insufficient documentation

## 2019-11-06 DIAGNOSIS — G8918 Other acute postprocedural pain: Secondary | ICD-10-CM | POA: Diagnosis not present

## 2019-11-06 HISTORY — PX: TENDON TRANSFER: SHX6109

## 2019-11-06 HISTORY — DX: Sjogren syndrome, unspecified: M35.00

## 2019-11-06 SURGERY — TRANSFER, TENDON
Anesthesia: Regional | Site: Leg Lower | Laterality: Right

## 2019-11-06 MED ORDER — MIDAZOLAM HCL 2 MG/2ML IJ SOLN
1.0000 mg | INTRAMUSCULAR | Status: DC | PRN
  Administered 2019-11-06: 2 mg via INTRAVENOUS

## 2019-11-06 MED ORDER — HYDROMORPHONE HCL 1 MG/ML IJ SOLN
INTRAMUSCULAR | Status: AC
  Filled 2019-11-06: qty 0.5

## 2019-11-06 MED ORDER — CEFAZOLIN SODIUM-DEXTROSE 2-4 GM/100ML-% IV SOLN
INTRAVENOUS | Status: AC
  Filled 2019-11-06: qty 100

## 2019-11-06 MED ORDER — FENTANYL CITRATE (PF) 100 MCG/2ML IJ SOLN
INTRAMUSCULAR | Status: AC
  Filled 2019-11-06: qty 2

## 2019-11-06 MED ORDER — MIDAZOLAM HCL 2 MG/2ML IJ SOLN
INTRAMUSCULAR | Status: AC
  Filled 2019-11-06: qty 2

## 2019-11-06 MED ORDER — LIDOCAINE 2% (20 MG/ML) 5 ML SYRINGE
INTRAMUSCULAR | Status: AC
  Filled 2019-11-06: qty 5

## 2019-11-06 MED ORDER — LACTATED RINGERS IV SOLN: INTRAVENOUS | Status: DC

## 2019-11-06 MED ORDER — ROPIVACAINE HCL 5 MG/ML IJ SOLN
INTRAMUSCULAR | Status: DC | PRN
  Administered 2019-11-06: 30 mL via PERINEURAL

## 2019-11-06 MED ORDER — LABETALOL HCL 5 MG/ML IV SOLN
INTRAVENOUS | Status: AC
  Filled 2019-11-06: qty 4

## 2019-11-06 MED ORDER — ONDANSETRON HCL 4 MG/2ML IJ SOLN
INTRAMUSCULAR | Status: AC
  Filled 2019-11-06: qty 2

## 2019-11-06 MED ORDER — CEFAZOLIN SODIUM-DEXTROSE 1-4 GM/50ML-% IV SOLN
INTRAVENOUS | Status: AC
  Filled 2019-11-06: qty 50

## 2019-11-06 MED ORDER — DEXAMETHASONE SODIUM PHOSPHATE 10 MG/ML IJ SOLN
INTRAMUSCULAR | Status: AC
  Filled 2019-11-06: qty 1

## 2019-11-06 MED ORDER — ACETAMINOPHEN 10 MG/ML IV SOLN: 1000.0000 mg | Freq: Once | INTRAVENOUS | Status: DC | PRN

## 2019-11-06 MED ORDER — FENTANYL CITRATE (PF) 100 MCG/2ML IJ SOLN
50.0000 ug | INTRAMUSCULAR | Status: DC | PRN
  Administered 2019-11-06: 50 ug via INTRAVENOUS

## 2019-11-06 MED ORDER — DEXAMETHASONE SODIUM PHOSPHATE 10 MG/ML IJ SOLN
INTRAMUSCULAR | Status: DC | PRN
  Administered 2019-11-06: 5 mg via INTRAVENOUS

## 2019-11-06 MED ORDER — CHLORHEXIDINE GLUCONATE 4 % EX LIQD: 60.0000 mL | Freq: Once | CUTANEOUS | Status: DC

## 2019-11-06 MED ORDER — PROPOFOL 10 MG/ML IV BOLUS
INTRAVENOUS | Status: DC | PRN
  Administered 2019-11-06: 200 mg via INTRAVENOUS

## 2019-11-06 MED ORDER — HYDROMORPHONE HCL 1 MG/ML IJ SOLN
0.2500 mg | INTRAMUSCULAR | Status: DC | PRN
  Administered 2019-11-06 (×2): 0.5 mg via INTRAVENOUS

## 2019-11-06 MED ORDER — LIDOCAINE 2% (20 MG/ML) 5 ML SYRINGE
INTRAMUSCULAR | Status: DC | PRN
  Administered 2019-11-06: 80 mg via INTRAVENOUS

## 2019-11-06 MED ORDER — PROMETHAZINE HCL 25 MG/ML IJ SOLN: 6.2500 mg | INTRAMUSCULAR | Status: DC | PRN

## 2019-11-06 MED ORDER — ONDANSETRON HCL 4 MG/2ML IJ SOLN
INTRAMUSCULAR | Status: DC | PRN
  Administered 2019-11-06: 4 mg via INTRAVENOUS

## 2019-11-06 SURGICAL SUPPLY — 53 items
BLADE OSC/SAG .038X5.5 CUT EDG (BLADE) IMPLANT
BLADE SURG 10 STRL SS (BLADE) ×3 IMPLANT
BLADE SURG 15 STRL LF DISP TIS (BLADE) ×2 IMPLANT
BLADE SURG 15 STRL SS (BLADE) ×1
BNDG COHESIVE 4X5 TAN STRL (GAUZE/BANDAGES/DRESSINGS) ×3 IMPLANT
BNDG ESMARK 4X9 LF (GAUZE/BANDAGES/DRESSINGS) ×3 IMPLANT
BOOT STEPPER DURA MED (SOFTGOODS) ×3 IMPLANT
COVER BACK TABLE 60X90IN (DRAPES) ×3 IMPLANT
COVER WAND RF STERILE (DRAPES) IMPLANT
DECANTER SPIKE VIAL GLASS SM (MISCELLANEOUS) IMPLANT
DRAPE EXTREMITY T 121X128X90 (DISPOSABLE) ×3 IMPLANT
DRAPE U-SHAPE 47X51 STRL (DRAPES) ×3 IMPLANT
DRSG EMULSION OIL 3X3 NADH (GAUZE/BANDAGES/DRESSINGS) ×3 IMPLANT
DURAPREP 26ML APPLICATOR (WOUND CARE) ×3 IMPLANT
ELECT REM PT RETURN 9FT ADLT (ELECTROSURGICAL) ×3
ELECTRODE REM PT RTRN 9FT ADLT (ELECTROSURGICAL) ×2 IMPLANT
GAUZE 4X4 16PLY RFD (DISPOSABLE) IMPLANT
GAUZE SPONGE 4X4 12PLY STRL (GAUZE/BANDAGES/DRESSINGS) ×3 IMPLANT
GLOVE BIO SURGEON STRL SZ7 (GLOVE) ×3 IMPLANT
GLOVE BIOGEL PI IND STRL 7.5 (GLOVE) ×2 IMPLANT
GLOVE BIOGEL PI IND STRL 9 (GLOVE) ×2 IMPLANT
GLOVE BIOGEL PI INDICATOR 7.5 (GLOVE) ×1
GLOVE BIOGEL PI INDICATOR 9 (GLOVE) ×1
GLOVE SURG ORTHO 9.0 STRL STRW (GLOVE) ×3 IMPLANT
GOWN STRL REUS W/ TWL LRG LVL3 (GOWN DISPOSABLE) ×2 IMPLANT
GOWN STRL REUS W/ TWL XL LVL3 (GOWN DISPOSABLE) ×2 IMPLANT
GOWN STRL REUS W/TWL LRG LVL3 (GOWN DISPOSABLE) ×1
GOWN STRL REUS W/TWL XL LVL3 (GOWN DISPOSABLE) ×1
NEEDLE HYPO 25X1 1.5 SAFETY (NEEDLE) IMPLANT
NS IRRIG 1000ML POUR BTL (IV SOLUTION) ×3 IMPLANT
PACK BASIN DAY SURGERY FS (CUSTOM PROCEDURE TRAY) ×3 IMPLANT
PAD CAST 4YDX4 CTTN HI CHSV (CAST SUPPLIES) ×2 IMPLANT
PADDING CAST ABS 4INX4YD NS (CAST SUPPLIES) ×1
PADDING CAST ABS COTTON 4X4 ST (CAST SUPPLIES) ×2 IMPLANT
PADDING CAST COTTON 4X4 STRL (CAST SUPPLIES) ×1
PENCIL SMOKE EVACUATOR (MISCELLANEOUS) ×3 IMPLANT
SPONGE LAP 18X18 RF (DISPOSABLE) ×3 IMPLANT
STOCKINETTE 6  STRL (DRAPES) ×1
STOCKINETTE 6 STRL (DRAPES) ×2 IMPLANT
SUT 0 FIBERLOOP 38 BLUE TPR ND (SUTURE)
SUT ETHILON 2 0 FSLX (SUTURE) ×3 IMPLANT
SUT ETHILON 3 0 FSLX (SUTURE) IMPLANT
SUT ETHILON 5 0 PS 2 18 (SUTURE) IMPLANT
SUT FIBERWIRE 2-0 18 17.9 3/8 (SUTURE)
SUT VIC AB 2-0 CT1 27 (SUTURE) ×1
SUT VIC AB 2-0 CT1 TAPERPNT 27 (SUTURE) ×2 IMPLANT
SUT VICRYL 4-0 PS2 18IN ABS (SUTURE) IMPLANT
SUTURE 0 FIBERLP 38 BLU TPR ND (SUTURE) IMPLANT
SUTURE FIBERWR 2-0 18 17.9 3/8 (SUTURE) IMPLANT
SYR BULB 3OZ (MISCELLANEOUS) ×3 IMPLANT
SYR CONTROL 10ML LL (SYRINGE) IMPLANT
TOWEL GREEN STERILE FF (TOWEL DISPOSABLE) ×6 IMPLANT
UNDERPAD 30X36 HEAVY ABSORB (UNDERPADS AND DIAPERS) IMPLANT

## 2019-11-06 NOTE — Anesthesia Postprocedure Evaluation (Signed)
Anesthesia Post Note  Patient: Pamela Esparza  Procedure(s) Performed: PERONEAL RECONSTRUCTION WITH PERONEAL LONGUS RIGHT LEG (Right Leg Lower)     Patient location during evaluation: PACU Anesthesia Type: Regional and General Level of consciousness: awake and alert Pain management: pain level controlled Vital Signs Assessment: post-procedure vital signs reviewed and stable Respiratory status: spontaneous breathing, nonlabored ventilation, respiratory function stable and patient connected to nasal cannula oxygen Cardiovascular status: blood pressure returned to baseline and stable Postop Assessment: no apparent nausea or vomiting Anesthetic complications: no    Last Vitals:  Vitals:   11/06/19 1030 11/06/19 1045  BP: 131/64 (!) 146/60  Pulse: 68 73  Resp: 14 18  Temp:  36.8 C  SpO2: 98% 94%    Last Pain:  Vitals:   11/06/19 1045  TempSrc:   PainSc: 3                  Racquelle Hyser P Skarleth Delmonico

## 2019-11-06 NOTE — Anesthesia Preprocedure Evaluation (Addendum)
Anesthesia Evaluation  Patient identified by MRN, date of birth, ID band Patient awake    Reviewed: Allergy & Precautions, NPO status , Patient's Chart, lab work & pertinent test results  Airway Mallampati: II  TM Distance: >3 FB Neck ROM: Full    Dental  (+) Loose,    Pulmonary asthma ,    Pulmonary exam normal breath sounds clear to auscultation       Cardiovascular negative cardio ROS Normal cardiovascular exam Rhythm:Regular Rate:Normal     Neuro/Psych PSYCHIATRIC DISORDERS Anxiety Depression negative neurological ROS     GI/Hepatic negative GI ROS, Neg liver ROS,   Endo/Other  Hypothyroidism Morbid obesity  Renal/GU negative Renal ROS     Musculoskeletal  (+) Fibromyalgia -, narcotic dependent  Abdominal (+) + obese,   Peds  Hematology HLD   Anesthesia Other Findings Tear Peroneus Brevis Right Leg  Reproductive/Obstetrics                            Anesthesia Physical Anesthesia Plan  ASA: III  Anesthesia Plan: General and Regional   Post-op Pain Management:  Regional for Post-op pain   Induction: Intravenous  PONV Risk Score and Plan: 3 and Ondansetron, Dexamethasone, Midazolam and Treatment may vary due to age or medical condition  Airway Management Planned: LMA  Additional Equipment:   Intra-op Plan:   Post-operative Plan: Extubation in OR  Informed Consent: I have reviewed the patients History and Physical, chart, labs and discussed the procedure including the risks, benefits and alternatives for the proposed anesthesia with the patient or authorized representative who has indicated his/her understanding and acceptance.     Dental advisory given  Plan Discussed with: CRNA  Anesthesia Plan Comments:         Anesthesia Quick Evaluation

## 2019-11-06 NOTE — H&P (Signed)
Pamela Esparza is an 54 y.o. female.   Chief Complaint: Peroneal tendinitis right ankle HPI: Patient is a 54 year old woman who presents in follow-up for peroneal brevis tendinitis.  She has been in a fracture boot for several weeks and has used an ankle stabilizing orthosis without relief.  She complains of pain and swelling over the peroneal tendons.  She is status post an MRI scan.  Past Medical History:  Diagnosis Date  . Anxiety   . Arthritis   . Asthma   . Cataract   . DDD (degenerative disc disease), lumbar   . Depression   . Fibromyalgia   . Hyperlipidemia   . Joint pain   . Neuropathy   . Sjogren's disease ([place])   . Thyroid disease     Past Surgical History:  Procedure Laterality Date  . ABDOMINAL HYSTERECTOMY    . APPENDECTOMY    . CHOLECYSTECTOMY    . finger sugery    . NECK SURGERY      Family History  Problem Relation Age of Onset  . Cancer Mother   . Heart attack Father   . Hypertension Father   . Diabetes Father   . Birth defects Maternal Grandmother        ovarian  . Depression Maternal Grandmother   . Birth defects Paternal Grandmother        brain  . Colon cancer Neg Hx   . Colon polyps Neg Hx   . Esophageal cancer Neg Hx   . Stomach cancer Neg Hx   . Rectal cancer Neg Hx    Social History:  reports that she has never smoked. She has never used smokeless tobacco. She reports current alcohol use. She reports that she does not use drugs.  Allergies:  Allergies  Allergen Reactions  . Aspirin Other (See Comments)    stomach upset stomach upset   . Hydrocodone-Acetaminophen Other (See Comments)    made her sick made her sick   . Lyrica [Pregabalin]   . Topiramate     Medications Prior to Admission  Medication Sig Dispense Refill  . Albuterol Sulfate (PROAIR HFA IN) Inhale into the lungs.    Marland Kitchen atorvastatin (LIPITOR) 10 MG tablet Take 1 tablet (10 mg total) by mouth daily. 90 tablet 0  . baclofen (LIORESAL) 10 MG tablet Take 10 mg by  mouth 3 (three) times daily as needed.    . Cholecalciferol (VITAMIN D3) 75 MCG (3000 UT) TABS Take 2,000 Int'l Units by mouth daily.    . DULoxetine (CYMBALTA) 60 MG capsule Take 120 mg by mouth daily.    . fluticasone (FLONASE) 50 MCG/ACT nasal spray SHAKE LIQUID AND USE 1 SPRAY IN EACH NOSTRIL DAILY 16 g 3  . gabapentin (NEURONTIN) 600 MG tablet Take 1 tablet (600 mg total) by mouth 3 (three) times daily. 90 tablet 3  . levothyroxine (SYNTHROID) 50 MCG tablet Take 1 tablet (50 mcg total) by mouth daily. 90 tablet 2  . meloxicam (MOBIC) 15 MG tablet Take 15 mg by mouth.    . Multiple Vitamins-Minerals (CENTRUM SILVER PO) Take by mouth.    . neomycin-polymyxin-hydrocortisone (CORTISPORIN) 3.5-10000-1 OTIC suspension     . oxyCODONE-acetaminophen (PERCOCET) 10-325 MG tablet Take 1 tablet by mouth 2 (two) times daily.    . pilocarpine (SALAGEN) 5 MG tablet Take 5 mg by mouth 3 (three) times daily.    Marland Kitchen ALPRAZolam (XANAX) 0.5 MG tablet Take 0.5 mg by mouth daily as needed.    . nitroGLYCERIN (  NITROSTAT) 0.4 MG SL tablet Place under the tongue.      No results found for this or any previous visit (from the past 48 hour(s)). No results found.  Review of Systems  All other systems reviewed and are negative.   Blood pressure (!) 135/56, pulse 75, temperature (!) 96.7 F (35.9 C), temperature source Tympanic, resp. rate 20, height 5\' 5"  (1.651 m), weight 126.1 kg, SpO2 99 %. Physical Exam  Patient is alert, oriented, no adenopathy, well-dressed, normal affect, normal respiratory effort. Examination patient has a good dorsalis pedis pulses she does have an antalgic gait she uses a cane she has swelling over the peroneal tendons and these are tender to palpation.  Review of the MRI scan shows partial thickness tearing at the insertion of the Achilles there is no full-thickness or retracted tear she has a longitudinal split tear of the peroneus brevis tendon distal to the lateral malleolus there is  no complete or retracted tear.  Patient is not tender to palpation over the Achilles tendon or the posterior tibial tendon. Assessment/Plan 1. Peroneal tendinitis, right leg     Plan: Patient states that despite conservative treatment she still is symptomatic and would like to proceed with surgical intervention.  Discussed that we would proceed with debridement of the peroneus brevis and reinforcement with the peroneus longus.  Risk and benefits were discussed including persistent pain nonhealing of the incision need for additional surgery.  Patient states she understands wished to proceed at this time.   Newt Minion, MD 11/06/2019, 7:27 AM

## 2019-11-06 NOTE — Discharge Instructions (Signed)

## 2019-11-06 NOTE — Progress Notes (Signed)
Assisted Dr. Ellender with right, ultrasound guided, popliteal block. Side rails up, monitors on throughout procedure. See vital signs in flow sheet. Tolerated Procedure well. ?

## 2019-11-06 NOTE — Anesthesia Procedure Notes (Signed)
Procedure Name: LMA Insertion Date/Time: 11/06/2019 8:48 AM Performed by: Gwyndolyn Saxon, CRNA Pre-anesthesia Checklist: Patient identified, Emergency Drugs available, Suction available and Patient being monitored Patient Re-evaluated:Patient Re-evaluated prior to induction Oxygen Delivery Method: Circle system utilized Preoxygenation: Pre-oxygenation with 100% oxygen Induction Type: IV induction Ventilation: Mask ventilation without difficulty LMA: LMA inserted LMA Size: 4.0 Number of attempts: 1 Placement Confirmation: positive ETCO2 and breath sounds checked- equal and bilateral Tube secured with: Tape Dental Injury: Teeth and Oropharynx as per pre-operative assessment

## 2019-11-06 NOTE — Transfer of Care (Signed)
Immediate Anesthesia Transfer of Care Note  Patient: Pamela Esparza  Procedure(s) Performed: PERONEAL RECONSTRUCTION WITH PERONEAL LONGUS RIGHT LEG (Right Leg Lower)  Patient Location: PACU  Anesthesia Type:General and Regional  Level of Consciousness: awake, alert  and oriented  Airway & Oxygen Therapy: Patient Spontanous Breathing and Patient connected to face mask oxygen  Post-op Assessment: Report given to RN and Post -op Vital signs reviewed and stable  Post vital signs: Reviewed and stable  Last Vitals:  Vitals Value Taken Time  BP 142/72 11/06/19 0920  Temp    Pulse 79 11/06/19 0924  Resp 20 11/06/19 0924  SpO2 100 % 11/06/19 0924  Vitals shown include unvalidated device data.  Last Pain:  Vitals:   11/06/19 0723  TempSrc: Tympanic  PainSc: 4       Patients Stated Pain Goal: 4 ([id number])  Complications: No apparent anesthesia complications

## 2019-11-06 NOTE — Anesthesia Procedure Notes (Signed)
Anesthesia Regional Block: Popliteal block   Pre-Anesthetic Checklist: ,, timeout performed, Correct Patient, Correct Site, Correct Laterality, Correct Procedure, Correct Position, site marked, Risks and benefits discussed,  Surgical consent,  Pre-op evaluation,  At surgeon's request and post-op pain management  Laterality: Right  Prep: chloraprep       Needles:  Injection technique: Single-shot  Needle Type: Echogenic Stimulator Needle     Needle Length: 10cm  Needle Gauge: 21     Additional Needles:   Procedures:,,,, ultrasound used (permanent image in chart),,,,  Narrative:  Start time: 11/06/2019 8:20 AM End time: 11/06/2019 8:30 AM Injection made incrementally with aspirations every 5 mL.  Performed by: Personally  Anesthesiologist: Murvin Natal, MD  Additional Notes: Functioning IV was confirmed and monitors were applied.  A timeout was performed. Sterile prep, hand hygiene and sterile gloves were used. A 168mm 21ga [place] echogenic stimulator needle was used. Negative aspiration and negative test dose prior to incremental administration of local anesthetic. The patient tolerated the procedure well.  Ultrasound guidance: relevent anatomy identified, needle position confirmed, local anesthetic spread visualized around nerve(s), vascular puncture avoided.  Image printed for medical record.

## 2019-11-06 NOTE — Op Note (Signed)
11/06/2019  9:26 AM  PATIENT:  Pamela Esparza    PRE-OPERATIVE DIAGNOSIS:  Tear Peroneus Brevis Right Leg  POST-OPERATIVE DIAGNOSIS:  Same  PROCEDURE:  PERONEAL RECONSTRUCTION WITH PERONEAL LONGUS RIGHT LEG  SURGEON:  Newt Minion, MD  PHYSICIAN ASSISTANT:None ANESTHESIA:   General  PREOPERATIVE INDICATIONS:  Pamela Esparza is a  54 y.o. female with a diagnosis of Tear Peroneus Brevis Right Leg who failed conservative measures and elected for surgical management.    The risks benefits and alternatives were discussed with the patient preoperatively including but not limited to the risks of infection, bleeding, nerve injury, cardiopulmonary complications, the need for revision surgery, among others, and the patient was willing to proceed.  OPERATIVE IMPLANTS: #0 FiberWire  @ENCIMAGES @  OPERATIVE FINDINGS: Approximately two thirds of the peroneus brevis was torn longitudinally  OPERATIVE PROCEDURE: Patient was brought the operating room after undergoing a regional block she then underwent a general anesthetic.  After adequate levels anesthesia were obtained patient's right lower extremity was prepped using DuraPrep draped into a sterile field a timeout was called.  A longitudinal incision was made over the peroneal tendons laterally to the right ankle.  This was carried down through the retinaculum.  The peroneus brevis was delivered into the surgical field and approximately two thirds of the peroneus brevis was degenerative longitudinally torn.  The degenerative tissue was removed this included muscle belly that extended down into the retinacular sheath.  There is also some partial tearing the peroneus longus this was also debrided.  The peroneus brevis was then reinforced with the peroneus longus and these were sutured together with 0 FiberWire.  The peroneus longus was released distally.  This had a good stable construct.  The wound was irrigated with normal saline the retinaculum was  closed using 0 FiberWire the skin was closed using 2-0 nylon.  A sterile dressing was applied patient was extubated taken the PACU in stable condition.   DISCHARGE PLANNING:  Antibiotic duration: Preoperative antibiotics 3 g Kefzol  Weightbearing: Touchdown weightbearing in a fracture boot  Pain medication: Patient has 10 mg Percocet at home  Dressing care/ Wound VAC: Follow-up in the office in 1 week to change the dressing  Ambulatory devices: Crutches with postoperative cam boot  Discharge to: Home.  Follow-up: In the office 1 week post operative.

## 2019-11-07 ENCOUNTER — Telehealth: Payer: Self-pay | Admitting: Adult Health

## 2019-11-07 ENCOUNTER — Encounter: Payer: Self-pay | Admitting: *Deleted

## 2019-11-07 NOTE — Telephone Encounter (Signed)
Patient called states she received her notice in the mail that Provider/ Katy's last day is tomorrow & needs to have her Rx for :  Albuterol Sulfate (PROAIR HFA IN) [id number]   Order Details Dose: -- Route: Inhalation Frequency: --  Dispense Quantity: -- Refills: --       Sig: Inhale into the lungs.   --Per patient she thought she ask Valetta Fuller for that @ her Nov 2020 OV.  --Forwarding request to med asst for review w/ provider & if approved send refill order to :   Preferred Pharmacies      [facility] [place], [place] [phone] (Phone) [phone] (Fax   If any questions pls call patient @ [phone].  --glh

## 2019-11-08 ENCOUNTER — Other Ambulatory Visit: Payer: Self-pay | Admitting: Adult Health

## 2019-11-08 MED ORDER — ALBUTEROL SULFATE HFA 108 (90 BASE) MCG/ACT IN AERS: 1.0000 | INHALATION_SPRAY | Freq: Four times a day (QID) | RESPIRATORY_TRACT | 1 refills | Status: DC | PRN

## 2019-11-11 ENCOUNTER — Other Ambulatory Visit: Payer: Self-pay | Admitting: Adult Health

## 2019-11-12 ENCOUNTER — Other Ambulatory Visit: Payer: Self-pay | Admitting: Family Medicine

## 2019-11-12 DIAGNOSIS — F4320 Adjustment disorder, unspecified: Secondary | ICD-10-CM | POA: Diagnosis not present

## 2019-11-13 ENCOUNTER — Encounter: Payer: Self-pay | Admitting: Family

## 2019-11-13 ENCOUNTER — Other Ambulatory Visit: Payer: Self-pay

## 2019-11-13 ENCOUNTER — Ambulatory Visit (INDEPENDENT_AMBULATORY_CARE_PROVIDER_SITE_OTHER): Payer: Federal, State, Local not specified - PPO | Admitting: Family

## 2019-11-13 VITALS — Ht 65.0 in | Wt 278.0 lb

## 2019-11-13 DIAGNOSIS — Z6841 Body Mass Index (BMI) 40.0 and over, adult: Secondary | ICD-10-CM | POA: Diagnosis not present

## 2019-11-13 DIAGNOSIS — M3500 Sicca syndrome, unspecified: Secondary | ICD-10-CM | POA: Diagnosis not present

## 2019-11-13 DIAGNOSIS — M7671 Peroneal tendinitis, right leg: Secondary | ICD-10-CM

## 2019-11-13 DIAGNOSIS — M7989 Other specified soft tissue disorders: Secondary | ICD-10-CM | POA: Diagnosis not present

## 2019-11-13 NOTE — Progress Notes (Signed)
Post-Op Visit Note   Patient: Pamela Esparza           Date of Birth: 1966/09/23           MRN: [id number] Visit Date: 11/13/2019 PCP: Esaw Grandchild, NP  Chief Complaint:  Chief Complaint  Patient presents with  . Right Leg - Routine Post Op    Peroneal reconstruction with peroneal longus     HPI:  HPI The patient is a 54 year old woman seen status post right perineal reconstruction with peroneal longus.  She has been nonweightbearing in a cam walker with a kneeling scooter.  She denies pain and has no questions today. Ortho Exam On examination of the right lower extremity she has minimal swelling.  The incision is well approximated sutures there is no gaping no drainage no sign of infection  Visit Diagnoses: No diagnosis found.  Plan: She will continue working on passive range of motion of her foot and ankle.  Continue with the cam walker continue nonweightbearing.  May begin daily Dial soap cleansing dry dressing changes.  She will follow-up in 2 weeks with Dr. Sharol Given anticipate harvesting sutures at that time.  Follow-Up Instructions: No follow-ups on file.   Imaging: No results found.  Orders:  No orders of the defined types were placed in this encounter.  No orders of the defined types were placed in this encounter.    PMFS History: Patient Active Problem List   Diagnosis Date Noted  . Peroneal tendinitis, right leg   . Constipation 08/14/2019  . Multiple atypical nevi 08/14/2019  . Haglund's deformity of right heel 06/25/2019  . Concussion 06/21/2019  . Acute right ankle pain 03/21/2019  . Hypothyroidism 03/21/2019  . Sore throat 01/23/2019  . Acute otitis media 01/23/2019  . Cough in adult 12/20/2018  . Healthcare maintenance 10/23/2018  . Chronic joint pain 10/23/2018  . Breast cancer screening 10/23/2018  . DDD (degenerative disc disease), lumbar 10/23/2018  . Muscle pain, fibromyalgia 10/23/2018   Past Medical History:  Diagnosis Date  . Anxiety    . Arthritis   . Asthma   . Cataract   . DDD (degenerative disc disease), lumbar   . Depression   . Fibromyalgia   . Hyperlipidemia   . Joint pain   . Neuropathy   . Sjogren's disease ([place])   . Thyroid disease     Family History  Problem Relation Age of Onset  . Cancer Mother   . Heart attack Father   . Hypertension Father   . Diabetes Father   . Birth defects Maternal Grandmother        ovarian  . Depression Maternal Grandmother   . Birth defects Paternal Grandmother        brain  . Colon cancer Neg Hx   . Colon polyps Neg Hx   . Esophageal cancer Neg Hx   . Stomach cancer Neg Hx   . Rectal cancer Neg Hx     Past Surgical History:  Procedure Laterality Date  . ABDOMINAL HYSTERECTOMY    . APPENDECTOMY    . CHOLECYSTECTOMY    . finger sugery    . NECK SURGERY    . TENDON TRANSFER Right 11/06/2019   Procedure: PERONEAL RECONSTRUCTION WITH PERONEAL LONGUS RIGHT LEG;  Surgeon: Newt Minion, MD;  Location: [facility];  Service: Orthopedics;  Laterality: Right;   Social History   Occupational History  . Not on file  Tobacco Use  . Smoking status: Never  Smoker  . Smokeless tobacco: Never Used  Substance and Sexual Activity  . Alcohol use: Yes    Comment: 4 drinks a year-rare  . Drug use: Never  . Sexual activity: Not Currently

## 2019-11-14 ENCOUNTER — Encounter: Payer: Federal, State, Local not specified - PPO | Admitting: Adult Health

## 2019-11-27 ENCOUNTER — Ambulatory Visit (INDEPENDENT_AMBULATORY_CARE_PROVIDER_SITE_OTHER): Payer: Federal, State, Local not specified - PPO | Admitting: Physician Assistant

## 2019-11-27 ENCOUNTER — Other Ambulatory Visit: Payer: Self-pay

## 2019-11-27 ENCOUNTER — Encounter: Payer: Self-pay | Admitting: Family

## 2019-11-27 VITALS — Ht 65.0 in | Wt 278.0 lb

## 2019-11-27 DIAGNOSIS — M7671 Peroneal tendinitis, right leg: Secondary | ICD-10-CM

## 2019-11-27 NOTE — Progress Notes (Signed)
Office Visit Note   Patient: Pamela Esparza           Date of Birth: 1966/02/15           MRN: [id number] Visit Date: 11/27/2019              Requested by: Esaw Grandchild, NP [place],  [place] [address] PCP: Esaw Grandchild, NP  Chief Complaint  Patient presents with  . Right Ankle - Routine Post Op    Right peroneal recon with peroneal longus       HPI: This is a pleasant 54 year old woman who is now 3 weeks status post perineal reconstruction with peroneal longus.  She is doing well.  Pain is well controlled she has no complaints she has been using a knee scooter and is nonweightbearing  Assessment & Plan: Visit Diagnoses: No diagnosis found.  Plan: She may begin weightbearing protected in her boot.  She will follow-up in 3 weeks at which time she could transition to an ASO brace Follow-Up Instructions: No follow-ups on file.   Ortho Exam  Patient is alert, oriented, no adenopathy, well-dressed, normal affect, normal respiratory effort. Focused examination of her right ankle demonstrates well-healed surgical incision.  Mild amount of soft tissue swelling no erythema no cellulitis no drainage ankle range of motion is good no Eversion was attempted.  Imaging: No results found. No images are attached to the encounter.  Labs: Lab Results  Component Value Date   HGBA1C 5.4 03/21/2019     Lab Results  Component Value Date   ALBUMIN 4.5 09/26/2019   ALBUMIN 4.4 03/21/2019    No results found for: MG Lab Results  Component Value Date   VD25OH 38.3 08/14/2019    No results found for: PREALBUMIN CBC EXTENDED Latest Ref Rng & Units 03/21/2019  WBC 3.4 - 10.8 x10E3/uL 7.0  RBC 3.77 - 5.28 x10E6/uL 4.61  HGB 11.1 - 15.9 g/dL 14.0  HCT 34.0 - 46.6 % 41.6  PLT 150 - 450 x10E3/uL 308  NEUTROABS 1.4 - 7.0 x10E3/uL 3.8  LYMPHSABS 0.7 - 3.1 x10E3/uL 2.5     Body mass index is 46.26 kg/m.  Orders:  No orders of the defined types were placed in  this encounter.  No orders of the defined types were placed in this encounter.    Procedures: No procedures performed  Clinical Data: No additional findings.  ROS:  All other systems negative, except as noted in the HPI. Review of Systems  Objective: Vital Signs: Ht 5\' 5"  (1.651 m)   Wt 278 lb (126.1 kg)   BMI 46.26 kg/m   Specialty Comments:  No specialty comments available.  PMFS History: Patient Active Problem List   Diagnosis Date Noted  . Peroneal tendinitis, right leg   . Constipation 08/14/2019  . Multiple atypical nevi 08/14/2019  . Haglund's deformity of right heel 06/25/2019  . Concussion 06/21/2019  . Acute right ankle pain 03/21/2019  . Hypothyroidism 03/21/2019  . Sore throat 01/23/2019  . Acute otitis media 01/23/2019  . Cough in adult 12/20/2018  . Healthcare maintenance 10/23/2018  . Chronic joint pain 10/23/2018  . Breast cancer screening 10/23/2018  . DDD (degenerative disc disease), lumbar 10/23/2018  . Muscle pain, fibromyalgia 10/23/2018   Past Medical History:  Diagnosis Date  . Anxiety   . Arthritis   . Asthma   . Cataract   . DDD (degenerative disc disease), lumbar   . Depression   . Fibromyalgia   .  Hyperlipidemia   . Joint pain   . Neuropathy   . Sjogren's disease ([place])   . Thyroid disease     Family History  Problem Relation Age of Onset  . Cancer Mother   . Heart attack Father   . Hypertension Father   . Diabetes Father   . Birth defects Maternal Grandmother        ovarian  . Depression Maternal Grandmother   . Birth defects Paternal Grandmother        brain  . Colon cancer Neg Hx   . Colon polyps Neg Hx   . Esophageal cancer Neg Hx   . Stomach cancer Neg Hx   . Rectal cancer Neg Hx     Past Surgical History:  Procedure Laterality Date  . ABDOMINAL HYSTERECTOMY    . APPENDECTOMY    . CHOLECYSTECTOMY    . finger sugery    . NECK SURGERY    . TENDON TRANSFER Right 11/06/2019   Procedure: PERONEAL  RECONSTRUCTION WITH PERONEAL LONGUS RIGHT LEG;  Surgeon: Newt Minion, MD;  Location: [facility];  Service: Orthopedics;  Laterality: Right;   Social History   Occupational History  . Not on file  Tobacco Use  . Smoking status: Never Smoker  . Smokeless tobacco: Never Used  Substance and Sexual Activity  . Alcohol use: Yes    Comment: 4 drinks a year-rare  . Drug use: Never  . Sexual activity: Not Currently

## 2019-12-17 ENCOUNTER — Other Ambulatory Visit: Payer: Self-pay | Admitting: Family Medicine

## 2019-12-17 MED ORDER — ATORVASTATIN CALCIUM 10 MG PO TABS: 10.0000 mg | ORAL_TABLET | Freq: Every day | ORAL | 0 refills | Status: DC

## 2019-12-17 NOTE — Telephone Encounter (Signed)
REFILL REQUEST RECEIVED VIA FAX. AS, CMA

## 2019-12-18 ENCOUNTER — Encounter: Payer: Self-pay | Admitting: Physician Assistant

## 2019-12-18 ENCOUNTER — Ambulatory Visit (INDEPENDENT_AMBULATORY_CARE_PROVIDER_SITE_OTHER): Payer: Federal, State, Local not specified - PPO | Admitting: Physician Assistant

## 2019-12-18 ENCOUNTER — Other Ambulatory Visit: Payer: Self-pay

## 2019-12-18 VITALS — Ht 65.0 in | Wt 278.0 lb

## 2019-12-18 DIAGNOSIS — M7671 Peroneal tendinitis, right leg: Secondary | ICD-10-CM

## 2019-12-18 NOTE — Progress Notes (Signed)
Office Visit Note   Patient: Pamela Esparza           Date of Birth: [birth year]           MRN: [id number] Visit Date: 12/18/2019              Requested by: Esaw Grandchild, NP [place],  [place] [id number] PCP: Esaw Grandchild, NP  Chief Complaint  Patient presents with  . Right Ankle - Routine Post Op    11/06/19 right peroneal reconstruction with peroneal longus       HPI: The patient is a 54 year old woman who is now 7 weeks status post right peroneal tendon reconstruction with transfer of peroneal longus.  She is very pleased with her progress and her ankle feels better than prior to surgery.  She just weaned out of her boot today.  Assessment & Plan: Visit Diagnoses: No diagnosis found.  Plan: We discussed balance exercises that would be simple for her to do such as holding on to something and balancing on her leg to help with proprioception.  I offered her physical therapy and a brace which she declined at this time.  She will follow-up in 1 month  Follow-Up Instructions: No follow-ups on file.   Ortho Exam  Patient is alert, oriented, no adenopathy, well-dressed, normal affect, normal respiratory effort. Focused examination of her right ankle demonstrates mild soft tissue swelling on the lateral aspect of her ankle she does have some hypertrophic scarring.  She has good plantar flexion dorsiflexion strength eversion strength is fair and only mildly painful.  No cellulitis CMS is intact  Imaging: No results found. No images are attached to the encounter.  Labs: Lab Results  Component Value Date   HGBA1C 5.4 03/21/2019     Lab Results  Component Value Date   ALBUMIN 4.5 09/26/2019   ALBUMIN 4.4 03/21/2019    No results found for: MG Lab Results  Component Value Date   VD25OH 38.3 08/14/2019    No results found for: PREALBUMIN CBC EXTENDED Latest Ref Rng & Units 03/21/2019  WBC 3.4 - 10.8 x10E3/uL 7.0  RBC 3.77 - 5.28 x10E6/uL 4.61  HGB  11.1 - 15.9 g/dL 14.0  HCT 34.0 - 46.6 % 41.6  PLT 150 - 450 x10E3/uL 308  NEUTROABS 1.4 - 7.0 x10E3/uL 3.8  LYMPHSABS 0.7 - 3.1 x10E3/uL 2.5     Body mass index is 46.26 kg/m.  Orders:  No orders of the defined types were placed in this encounter.  No orders of the defined types were placed in this encounter.    Procedures: No procedures performed  Clinical Data: No additional findings.  ROS:  All other systems negative, except as noted in the HPI. Review of Systems  Objective: Vital Signs: Ht 5\' 5"  (1.651 m)   Wt 278 lb (126.1 kg)   BMI 46.26 kg/m   Specialty Comments:  No specialty comments available.  PMFS History: Patient Active Problem List   Diagnosis Date Noted  . Peroneal tendinitis, right leg   . Constipation 08/14/2019  . Multiple atypical nevi 08/14/2019  . Haglund's deformity of right heel 06/25/2019  . Concussion 06/21/2019  . Acute right ankle pain 03/21/2019  . Hypothyroidism 03/21/2019  . Sore throat 01/23/2019  . Acute otitis media 01/23/2019  . Cough in adult 12/20/2018  . Healthcare maintenance 10/23/2018  . Chronic joint pain 10/23/2018  . Breast cancer screening 10/23/2018  . DDD (degenerative disc disease), lumbar 10/23/2018  .  Muscle pain, fibromyalgia 10/23/2018   Past Medical History:  Diagnosis Date  . Anxiety   . Arthritis   . Asthma   . Cataract   . DDD (degenerative disc disease), lumbar   . Depression   . Fibromyalgia   . Hyperlipidemia   . Joint pain   . Neuropathy   . Sjogren's disease ([place])   . Thyroid disease     Family History  Problem Relation Age of Onset  . Cancer Mother   . Heart attack Father   . Hypertension Father   . Diabetes Father   . Birth defects Maternal Grandmother        ovarian  . Depression Maternal Grandmother   . Birth defects Paternal Grandmother        brain  . Colon cancer Neg Hx   . Colon polyps Neg Hx   . Esophageal cancer Neg Hx   . Stomach cancer Neg Hx   . Rectal cancer  Neg Hx     Past Surgical History:  Procedure Laterality Date  . ABDOMINAL HYSTERECTOMY    . APPENDECTOMY    . CHOLECYSTECTOMY    . finger sugery    . NECK SURGERY    . TENDON TRANSFER Right 11/06/2019   Procedure: PERONEAL RECONSTRUCTION WITH PERONEAL LONGUS RIGHT LEG;  Surgeon: Newt Minion, MD;  Location: [facility];  Service: Orthopedics;  Laterality: Right;   Social History   Occupational History  . Not on file  Tobacco Use  . Smoking status: Never Smoker  . Smokeless tobacco: Never Used  Substance and Sexual Activity  . Alcohol use: Yes    Comment: 4 drinks a year-rare  . Drug use: Never  . Sexual activity: Not Currently

## 2019-12-22 ENCOUNTER — Other Ambulatory Visit: Payer: Self-pay | Admitting: Family Medicine

## 2019-12-22 ENCOUNTER — Other Ambulatory Visit: Payer: Self-pay | Admitting: Adult Health

## 2019-12-24 ENCOUNTER — Other Ambulatory Visit: Payer: Self-pay | Admitting: Family Medicine

## 2019-12-24 ENCOUNTER — Telehealth: Payer: Self-pay | Admitting: Adult Health

## 2019-12-24 MED ORDER — LEVOTHYROXINE SODIUM 50 MCG PO TABS: 50.0000 ug | ORAL_TABLET | Freq: Every day | ORAL | 0 refills | Status: DC

## 2019-12-24 NOTE — Telephone Encounter (Signed)
I am unable to send in any Atorvastatin due to refill protocol. I did send in #30 day supply of levothyroxine to requested pharmacy.  Patient was last seen 08/14/19 and was advised to follow up in 3 months. Please contact patient to schedule apt and inform of refill protocol. AS, CMA

## 2019-12-24 NOTE — Addendum Note (Signed)
Addended by: Mickel Crow on: 12/24/2019 02:14 PM   Modules accepted: Orders

## 2019-12-24 NOTE — Telephone Encounter (Signed)
Patient is requesting a refill of her thyroid med and atorvastatin, if approved please send [facility] on [address] and [address].

## 2019-12-25 DIAGNOSIS — G8929 Other chronic pain: Secondary | ICD-10-CM | POA: Diagnosis not present

## 2019-12-25 DIAGNOSIS — M47812 Spondylosis without myelopathy or radiculopathy, cervical region: Secondary | ICD-10-CM | POA: Diagnosis not present

## 2019-12-25 DIAGNOSIS — M961 Postlaminectomy syndrome, not elsewhere classified: Secondary | ICD-10-CM | POA: Diagnosis not present

## 2019-12-25 DIAGNOSIS — M25552 Pain in left hip: Secondary | ICD-10-CM | POA: Diagnosis not present

## 2019-12-25 DIAGNOSIS — M5030 Other cervical disc degeneration, unspecified cervical region: Secondary | ICD-10-CM | POA: Diagnosis not present

## 2019-12-25 DIAGNOSIS — M461 Sacroiliitis, not elsewhere classified: Secondary | ICD-10-CM | POA: Diagnosis not present

## 2019-12-25 DIAGNOSIS — M25562 Pain in left knee: Secondary | ICD-10-CM | POA: Diagnosis not present

## 2019-12-25 DIAGNOSIS — M5136 Other intervertebral disc degeneration, lumbar region: Secondary | ICD-10-CM | POA: Diagnosis not present

## 2019-12-25 DIAGNOSIS — M4802 Spinal stenosis, cervical region: Secondary | ICD-10-CM | POA: Diagnosis not present

## 2019-12-25 DIAGNOSIS — M79604 Pain in right leg: Secondary | ICD-10-CM | POA: Diagnosis not present

## 2019-12-25 DIAGNOSIS — M7918 Myalgia, other site: Secondary | ICD-10-CM | POA: Diagnosis not present

## 2019-12-25 DIAGNOSIS — M545 Low back pain: Secondary | ICD-10-CM | POA: Diagnosis not present

## 2019-12-25 DIAGNOSIS — M47816 Spondylosis without myelopathy or radiculopathy, lumbar region: Secondary | ICD-10-CM | POA: Diagnosis not present

## 2019-12-25 DIAGNOSIS — Z79899 Other long term (current) drug therapy: Secondary | ICD-10-CM | POA: Diagnosis not present

## 2020-01-02 ENCOUNTER — Other Ambulatory Visit: Payer: Self-pay | Admitting: Adult Health

## 2020-01-17 ENCOUNTER — Other Ambulatory Visit: Payer: Self-pay | Admitting: Adult Health

## 2020-01-17 ENCOUNTER — Telehealth: Payer: Self-pay

## 2020-01-17 ENCOUNTER — Other Ambulatory Visit: Payer: Self-pay | Admitting: Physician Assistant

## 2020-01-17 NOTE — Telephone Encounter (Signed)
Please call pt to schedule appt.  No further refills until pt is seen.  T. Lerlene Treadwell, CMA  

## 2020-01-22 ENCOUNTER — Other Ambulatory Visit: Payer: Self-pay | Admitting: Family Medicine

## 2020-01-22 ENCOUNTER — Telehealth: Payer: Self-pay | Admitting: Adult Health

## 2020-01-22 NOTE — Telephone Encounter (Signed)
LVM for pt to call to discuss.  T. Ludmilla Mcgillis, CMA  

## 2020-01-22 NOTE — Telephone Encounter (Signed)
Patient called states she is in [place] , father just died 02-12-2020 & mother had to be hospitalized--states really needs medication refill:  Patient request refill on:  atorvastatin (LIPITOR) 10 MG tablet [id number]   Order Details Dose: 10 mg Route: Oral Frequency: Daily  Dispense Quantity: 15 tablet Refills: 0       Sig: Take 1 tablet (10 mg total) by mouth daily. **PATIENT NEEDS APT FOR FURTHER REFILLS**   ----Advised pt  Appt / Telehealth required per last refill order, she states not listed on her pill bottle-- Pt scheduled Telehealth for 01/24/20.  Forwarding request to med asst  (Updated info & request for refill)   [facility] [place], [place] [phone] (Phone) [phone] (Fax)   --glh

## 2020-01-22 NOTE — Telephone Encounter (Signed)
Spoke with pt and advised her that further refills will be addressed at her telehealth visit with Lorrene Reid on 01/24/2020.  Pt expressed understanding and is agreeable.  Charyl Bigger, CMA

## 2020-01-24 ENCOUNTER — Encounter: Payer: Self-pay | Admitting: Physician Assistant

## 2020-01-24 ENCOUNTER — Other Ambulatory Visit: Payer: Self-pay

## 2020-01-24 ENCOUNTER — Telehealth (INDEPENDENT_AMBULATORY_CARE_PROVIDER_SITE_OTHER): Payer: Federal, State, Local not specified - PPO | Admitting: Physician Assistant

## 2020-01-24 DIAGNOSIS — Z8709 Personal history of other diseases of the respiratory system: Secondary | ICD-10-CM

## 2020-01-24 DIAGNOSIS — E7800 Pure hypercholesterolemia, unspecified: Secondary | ICD-10-CM

## 2020-01-24 DIAGNOSIS — E039 Hypothyroidism, unspecified: Secondary | ICD-10-CM | POA: Diagnosis not present

## 2020-01-24 MED ORDER — ATORVASTATIN CALCIUM 10 MG PO TABS: 10.0000 mg | ORAL_TABLET | Freq: Every day | ORAL | 1 refills | Status: DC

## 2020-01-24 MED ORDER — LEVOTHYROXINE SODIUM 50 MCG PO TABS: ORAL_TABLET | ORAL | 1 refills | Status: DC

## 2020-01-24 NOTE — Progress Notes (Addendum)
Telehealth office visit note for Pamela Reid, PA-C- at Primary Care at [facility]   I connected with current patient today by telephone and verified that I am speaking with the correct person   . Location of the patient: Home . Location of the provider: Office - This visit type was conducted due to national recommendations for restrictions regarding the COVID-19 Pandemic (e.g. social distancing) in an effort to limit this patient's exposure and mitigate transmission in our community.    - No physical exam could be performed with this format, beyond that communicated to [place] by the patient/ family members as noted.   - Additionally my office staff/ schedulers were to discuss with the patient that there may be a monetary charge related to this service, depending on their medical insurance.  My understanding is that patient understood and consented to proceed.     _________________________________________________________________________________   History of Present Illness: Pt calls in today for medication refills. Pt reports she has been well and doesn't have any concerns. Pt denies chest pain, palpitations, heat/cold intolerance, increased fatigue, shortness of breath, or weight changes. Pt reports she is going to be in [place] for about a month or longer, unsure at the moment and is concerned if she will have problems requesting refills. Advised her when requesting refills to notify us of which pharmacy to send her refills. Pt reports compliance with atorvastatin and denies side effects. States her physical activity is about the same and is working on improving her diet because she would like to loose some weight.      No flowsheet data found.  Depression screen [facility] 2/9 08/14/2019 06/21/2019 03/21/2019 01/23/2019 10/23/2018  Decreased Interest 2 0 2 0 1  Down, Depressed, Hopeless 0 1 0 0 0  PHQ - 2 Score 2 1 2  0 1  Altered sleeping 3 2 3 1 2   Tired, decreased energy 3 3 3 2 2   Change in  appetite 1 1 0 1 0  Feeling bad or failure about yourself  0 0 0 0 0  Trouble concentrating 2 1 1 1 2   Moving slowly or fidgety/restless 1 0 0 1 0  Suicidal thoughts 0 0 0 0 0  PHQ-9 Score 12 8 9 6 7   Difficult doing work/chores Very difficult Somewhat difficult Very difficult Somewhat difficult Not difficult at all      Impression and Recommendations:     1. Hypothyroidism, unspecified type   2. Elevated LDL cholesterol level   3. History of asthma     Hypothyroidism: - Last TSH 2.580, stable. - Pt is asymptomatic. - Will send refills of levothyroxine 50 MCG. - Will recheck TSH in 4-6 months at next OV  Elevated LDL cholesterol: - Last lipid panel wnl's, stable - Continue Atorvastatin 10 mg - Encourage to follow heart healthy diet low in cholesterol/sat and trans fat and stay as physically active as possible - Will recheck lipid panel at next OV  History of Asthma: - Pt uses albuterol for asthma and currently it is well controlled - Continue to monitor  - As part of my medical decision making, I reviewed the following data within the [facility] History obtained from pt /family, CMA notes reviewed and incorporated if applicable, Labs reviewed, Radiograph/ tests reviewed if applicable and OV notes from prior OV's with me, as well as any other specialists she/he has seen since seeing me last, were all reviewed and used in my medical decision making  process today.    - Additionally, when appropriate, discussion had with patient regarding our treatment plan, and their biases/concerns about that plan were used in my medical decision making today.    - The patient agreed with the plan and demonstrated an understanding of the instructions.   No barriers to understanding were identified.     - The patient was advised to call back or seek an in-person evaluation if the symptoms worsen or if the condition fails to improve as anticipated.   Return for CPE and FBW in  4-6 months.    No orders of the defined types were placed in this encounter.   No orders of the defined types were placed in this encounter.   Medications Discontinued During This Encounter  Medication Reason  . neomycin-polymyxin-hydrocortisone (CORTISPORIN) 3.5-10000-1 OTIC suspension Completed Course       Time spent on visit including pre-visit chart review and post-visit care was 12 minutes.      The [place] was signed into law in 2016 which includes the topic of electronic health records.  This provides immediate access to information in MyChart.  This includes consultation notes, operative notes, office notes, lab results and pathology reports.  If you have any questions about what you read please let us know at your next visit or call us at the office.  We are right here with you.   __________________________________________________________________________________     Patient Care Team    Relationship Specialty Notifications Start End  [place], Berna Spare, NP PCP - General Family Medicine  10/23/18      -Vitals obtained; medications/ allergies reconciled;  personal medical, social, Sx etc.histories were updated by CMA, reviewed by me and are reflected in chart   Patient Active Problem List   Diagnosis Date Noted  . Peroneal tendinitis, right leg   . Constipation 08/14/2019  . Multiple atypical nevi 08/14/2019  . Haglund's deformity of right heel 06/25/2019  . Concussion 06/21/2019  . Acute right ankle pain 03/21/2019  . Hypothyroidism 03/21/2019  . Sore throat 01/23/2019  . Acute otitis media 01/23/2019  . Cough in adult 12/20/2018  . Healthcare maintenance 10/23/2018  . Chronic joint pain 10/23/2018  . Breast cancer screening 10/23/2018  . DDD (degenerative disc disease), lumbar 10/23/2018  . Muscle pain, fibromyalgia 10/23/2018     Current Meds  Medication Sig  . albuterol (VENTOLIN HFA) 108 (90 Base) MCG/ACT inhaler INHALE 1 TO 2 PUFFS  INTO THE LUNGS EVERY 6 HOURS AS NEEDED  . ALPRAZolam (XANAX) 0.5 MG tablet Take 0.5 mg by mouth daily as needed.  . baclofen (LIORESAL) 10 MG tablet Take 10 mg by mouth 3 (three) times daily as needed.  . Cholecalciferol (VITAMIN D3) 75 MCG (3000 UT) TABS Take 2,000 Int'l Units by mouth daily.  . DULoxetine (CYMBALTA) 60 MG capsule Take 120 mg by mouth daily.  . fluticasone (FLONASE) 50 MCG/ACT nasal spray SHAKE LIQUID AND USE 1 SPRAY IN EACH NOSTRIL DAILY  . gabapentin (NEURONTIN) 600 MG tablet Take 1 tablet (600 mg total) by mouth 3 (three) times daily.  Marland Kitchen levothyroxine (SYNTHROID) 50 MCG tablet TAKE 1 TABLET(50 MCG) BY MOUTH DAILY  . meloxicam (MOBIC) 15 MG tablet Take 15 mg by mouth.  . Multiple Vitamins-Minerals (CENTRUM SILVER PO) Take by mouth.  . nitroGLYCERIN (NITROSTAT) 0.4 MG SL tablet Place under the tongue.  Marland Kitchen oxyCODONE-acetaminophen (PERCOCET) 10-325 MG tablet Take 1 tablet by mouth 2 (two) times daily.  . pilocarpine (SALAGEN) 5  MG tablet Take 5 mg by mouth 3 (three) times daily.     Allergies:  Allergies  Allergen Reactions  . Aspirin Other (See Comments)    stomach upset stomach upset   . Hydrocodone-Acetaminophen Other (See Comments)    made her sick made her sick   . Lyrica [Pregabalin]   . Topiramate      ROS:  See above HPI for pertinent positives and negatives   Objective:   There were no vitals taken for this visit.  (if some vitals are omitted, this means that patient was UNABLE to obtain them even though they were asked to get them prior to OV today.  They were asked to call us at their earliest convenience with these once obtained. ) General: A & O * 3; sounds in no acute distress; in usual state of health.  Skin: Pt confirms warm and dry extremities and pink fingertips HEENT: Pt confirms lips non-cyanotic Chest: Patient confirms normal chest excursion and movement Respiratory: speaking in full sentences, no conversational dyspnea; patient confirms  no use of accessory muscles Psych: insight appears good, mood- appears full

## 2020-02-10 ENCOUNTER — Other Ambulatory Visit: Payer: Self-pay | Admitting: Physician Assistant

## 2020-03-14 ENCOUNTER — Other Ambulatory Visit: Payer: Self-pay | Admitting: Physician Assistant

## 2020-04-04 DIAGNOSIS — Z01812 Encounter for preprocedural laboratory examination: Secondary | ICD-10-CM | POA: Diagnosis not present

## 2020-04-04 DIAGNOSIS — Z79899 Other long term (current) drug therapy: Secondary | ICD-10-CM | POA: Diagnosis not present

## 2020-04-09 ENCOUNTER — Encounter: Payer: Self-pay | Admitting: Medical-Surgical

## 2020-04-09 ENCOUNTER — Other Ambulatory Visit: Payer: Self-pay

## 2020-04-09 ENCOUNTER — Telehealth (INDEPENDENT_AMBULATORY_CARE_PROVIDER_SITE_OTHER): Payer: Federal, State, Local not specified - PPO | Admitting: Medical-Surgical

## 2020-04-09 VITALS — Ht 65.0 in | Wt 260.0 lb

## 2020-04-09 DIAGNOSIS — M79602 Pain in left arm: Secondary | ICD-10-CM

## 2020-04-09 MED ORDER — PREDNISONE 50 MG PO TABS: 50.0000 mg | ORAL_TABLET | Freq: Every day | ORAL | 0 refills | Status: DC

## 2020-04-09 NOTE — Progress Notes (Signed)
Virtual Visit via Video Note  I connected with Pamela Esparza on 04/09/20 at 11:10 AM EDT by a video enabled telemedicine application and verified that I am speaking with the correct person using two identifiers.   I discussed the limitations of evaluation and management by telemedicine and the availability of in person appointments. The patient expressed understanding and agreed to proceed.  Patient location: home Provider locations: office  Subjective:    CC: left arm pain  HPI: Pleasant 54 year old female presenting via MyChart video visit for left arm pain. She has a significant history of fibromyalgia and chronic joint pain but notes that her left arm has been worse than usual for the past 2-3 months. She did have a left shoulder injury about 10 years ago and has been treated in the past with a cortisone shot.  Notes that this pain is a bit different as it seems more focused in the elbow.  She does have pain and weakness in the left arm from the elbow extending down to the left fourth and fifth digits.  She does not remember a specific injury but does recall that she had been working in the yard pulling weeds before the pain started.  She takes meloxicam at night as needed and Percocet twice daily for pain control.  Notes that the Percocet does help her pain although it does not fully resolve it.  Past medical history, Surgical history, Family history not pertinant except as noted below, Social history, Allergies, and medications have been entered into the medical record, reviewed, and corrections made.   Review of Systems: See HPI for pertinent positives and negatives.   Objective:    General: Speaking clearly in complete sentences without any shortness of breath.  Alert and oriented x3.  Normal judgment. No apparent acute distress.  Impression and Recommendations:    1. Left arm pain Pain and weakness likely related to compression or injury to the ulnar nerve.  We will do a 5-day  burst of prednisone 50 mg daily.  Continue Percocet and meloxicam as prescribed.  To prevent further compression/injury on the ulnar nerve, recommend getting an elbow sleeve to provide padding.   Return if symptoms worsen or fail to improve.  20 minutes of non-face-to-face time was provided during this encounter.  I discussed the assessment and treatment plan with the patient. The patient was provided an opportunity to ask questions and all were answered. The patient agreed with the plan and demonstrated an understanding of the instructions.   The patient was advised to call back or seek an in-person evaluation if the symptoms worsen or if the condition fails to improve as anticipated.  Clearnce Sorrel, DNP, APRN, FNP-BC [facility]

## 2020-04-10 ENCOUNTER — Ambulatory Visit (INDEPENDENT_AMBULATORY_CARE_PROVIDER_SITE_OTHER): Payer: Federal, State, Local not specified - PPO | Admitting: Physician Assistant

## 2020-04-10 ENCOUNTER — Ambulatory Visit: Payer: Self-pay

## 2020-04-10 ENCOUNTER — Encounter: Payer: Self-pay | Admitting: Orthopedic Surgery

## 2020-04-10 DIAGNOSIS — M47816 Spondylosis without myelopathy or radiculopathy, lumbar region: Secondary | ICD-10-CM | POA: Diagnosis not present

## 2020-04-10 DIAGNOSIS — G8929 Other chronic pain: Secondary | ICD-10-CM | POA: Diagnosis not present

## 2020-04-10 DIAGNOSIS — Z79891 Long term (current) use of opiate analgesic: Secondary | ICD-10-CM | POA: Diagnosis not present

## 2020-04-10 DIAGNOSIS — M792 Neuralgia and neuritis, unspecified: Secondary | ICD-10-CM | POA: Diagnosis not present

## 2020-04-10 DIAGNOSIS — Z6841 Body Mass Index (BMI) 40.0 and over, adult: Secondary | ICD-10-CM | POA: Diagnosis not present

## 2020-04-10 DIAGNOSIS — Z79899 Other long term (current) drug therapy: Secondary | ICD-10-CM | POA: Diagnosis not present

## 2020-04-10 DIAGNOSIS — M7671 Peroneal tendinitis, right leg: Secondary | ICD-10-CM | POA: Diagnosis not present

## 2020-04-10 DIAGNOSIS — M5136 Other intervertebral disc degeneration, lumbar region: Secondary | ICD-10-CM | POA: Diagnosis not present

## 2020-04-10 DIAGNOSIS — M461 Sacroiliitis, not elsewhere classified: Secondary | ICD-10-CM | POA: Diagnosis not present

## 2020-04-10 DIAGNOSIS — M5010 Cervical disc disorder with radiculopathy, unspecified cervical region: Secondary | ICD-10-CM | POA: Diagnosis not present

## 2020-04-10 DIAGNOSIS — M961 Postlaminectomy syndrome, not elsewhere classified: Secondary | ICD-10-CM | POA: Diagnosis not present

## 2020-04-10 DIAGNOSIS — G5781 Other specified mononeuropathies of right lower limb: Secondary | ICD-10-CM | POA: Diagnosis not present

## 2020-04-10 DIAGNOSIS — M533 Sacrococcygeal disorders, not elsewhere classified: Secondary | ICD-10-CM | POA: Diagnosis not present

## 2020-04-10 DIAGNOSIS — M4722 Other spondylosis with radiculopathy, cervical region: Secondary | ICD-10-CM | POA: Diagnosis not present

## 2020-04-10 DIAGNOSIS — M4802 Spinal stenosis, cervical region: Secondary | ICD-10-CM | POA: Diagnosis not present

## 2020-04-10 DIAGNOSIS — M7918 Myalgia, other site: Secondary | ICD-10-CM | POA: Diagnosis not present

## 2020-04-10 NOTE — Progress Notes (Signed)
Office Visit Note   Patient: Pamela Esparza           Date of Birth: Apr 23, 1966           MRN: [id number] Visit Date: 04/10/2020              Requested by: Esaw Grandchild, NP [place],  [place] [id number] PCP: Esaw Grandchild, NP  Chief Complaint  Patient presents with  . Right Ankle - Pain      HPI: This is a pleasant woman who is 5 months status post peroneal tendon reconstruction.  She was doing well.  2 months ago she did have a ankle bowl.  She did not have much swelling but she had pain in the posterior aspect of her foot and ankle at the insertion of the Achilles tendon.  She is still using a cane.  Denies any pain over the reconstruction  Assessment & Plan: Visit Diagnoses:  1. Peroneal tendinitis, right leg     Plan: I would like for her to go back into her boot with a heel lift for 3 weeks.  She will follow-up for reevaluation at that time I think she has insertional Achilles tendinitis with calcification seen on previous x-rays  Follow-Up Instructions: No follow-ups on file.   Ortho Exam  Patient is alert, oriented, no adenopathy, well-dressed, normal affect, normal respiratory effort. Well-healed surgical incision mild soft tissue swelling she has good eversion to resistance without pain nontender over the peroneal tendon she does have tenderness over the insertion full area of the Achilles she has good plantar flexion strength compartments are soft and compressible  Imaging: No results found. No images are attached to the encounter.  Labs: Lab Results  Component Value Date   HGBA1C 5.4 03/21/2019     Lab Results  Component Value Date   ALBUMIN 4.5 09/26/2019   ALBUMIN 4.4 03/21/2019    No results found for: MG Lab Results  Component Value Date   VD25OH 38.3 08/14/2019    No results found for: PREALBUMIN CBC EXTENDED Latest Ref Rng & Units 03/21/2019  WBC 3.4 - 10.8 x10E3/uL 7.0  RBC 3.77 - 5.28 x10E6/uL 4.61  HGB 11.1 - 15.9  g/dL 14.0  HCT 34.0 - 46.6 % 41.6  PLT 150 - 450 x10E3/uL 308  NEUTROABS 1 - 7 x10E3/uL 3.8  LYMPHSABS 0 - 3 x10E3/uL 2.5     There is no height or weight on file to calculate BMI.  Orders:  Orders Placed This Encounter  Procedures  . XR Ankle Complete Right   No orders of the defined types were placed in this encounter.    Procedures: No procedures performed  Clinical Data: No additional findings.  ROS:  All other systems negative, except as noted in the HPI. Review of Systems  Objective: Vital Signs: There were no vitals taken for this visit.  Specialty Comments:  No specialty comments available.  PMFS History: Patient Active Problem List   Diagnosis Date Noted  . Peroneal tendinitis, right leg   . Constipation 08/14/2019  . Multiple atypical nevi 08/14/2019  . Haglund's deformity of right heel 06/25/2019  . Concussion 06/21/2019  . Acute right ankle pain 03/21/2019  . Hypothyroidism 03/21/2019  . Sore throat 01/23/2019  . Acute otitis media 01/23/2019  . Cough in adult 12/20/2018  . Healthcare maintenance 10/23/2018  . Chronic joint pain 10/23/2018  . Breast cancer screening 10/23/2018  . DDD (degenerative disc disease), lumbar 10/23/2018  .  Muscle pain, fibromyalgia 10/23/2018   Past Medical History:  Diagnosis Date  . Anxiety   . Arthritis   . Asthma   . Cataract   . DDD (degenerative disc disease), lumbar   . Depression   . Fibromyalgia   . Hyperlipidemia   . Joint pain   . Neuropathy   . Sjogren's disease ([place])   . Thyroid disease     Family History  Problem Relation Age of Onset  . Cancer Mother   . Heart attack Father   . Hypertension Father   . Diabetes Father   . Birth defects Maternal Grandmother        ovarian  . Depression Maternal Grandmother   . Birth defects Paternal Grandmother        brain  . Colon cancer Neg Hx   . Colon polyps Neg Hx   . Esophageal cancer Neg Hx   . Stomach cancer Neg Hx   . Rectal cancer Neg Hx      Past Surgical History:  Procedure Laterality Date  . ABDOMINAL HYSTERECTOMY    . APPENDECTOMY    . CHOLECYSTECTOMY    . finger sugery    . NECK SURGERY    . TENDON TRANSFER Right 11/06/2019   Procedure: PERONEAL RECONSTRUCTION WITH PERONEAL LONGUS RIGHT LEG;  Surgeon: Newt Minion, MD;  Location: [facility];  Service: Orthopedics;  Laterality: Right;   Social History   Occupational History  . Not on file  Tobacco Use  . Smoking status: Never Smoker  . Smokeless tobacco: Never Used  Vaping Use  . Vaping Use: Never used  Substance and Sexual Activity  . Alcohol use: Yes    Comment: 4 drinks a year-rare  . Drug use: Never  . Sexual activity: Not Currently

## 2020-04-17 DIAGNOSIS — M25561 Pain in right knee: Secondary | ICD-10-CM | POA: Diagnosis not present

## 2020-04-17 DIAGNOSIS — M5136 Other intervertebral disc degeneration, lumbar region: Secondary | ICD-10-CM | POA: Diagnosis not present

## 2020-04-17 DIAGNOSIS — G8929 Other chronic pain: Secondary | ICD-10-CM | POA: Diagnosis not present

## 2020-04-17 DIAGNOSIS — M461 Sacroiliitis, not elsewhere classified: Secondary | ICD-10-CM | POA: Diagnosis not present

## 2020-04-17 DIAGNOSIS — M5030 Other cervical disc degeneration, unspecified cervical region: Secondary | ICD-10-CM | POA: Diagnosis not present

## 2020-04-17 DIAGNOSIS — M170 Bilateral primary osteoarthritis of knee: Secondary | ICD-10-CM | POA: Diagnosis not present

## 2020-04-17 DIAGNOSIS — G588 Other specified mononeuropathies: Secondary | ICD-10-CM | POA: Diagnosis not present

## 2020-04-17 DIAGNOSIS — M47812 Spondylosis without myelopathy or radiculopathy, cervical region: Secondary | ICD-10-CM | POA: Diagnosis not present

## 2020-04-17 DIAGNOSIS — G5721 Lesion of femoral nerve, right lower limb: Secondary | ICD-10-CM | POA: Diagnosis not present

## 2020-04-17 DIAGNOSIS — M533 Sacrococcygeal disorders, not elsewhere classified: Secondary | ICD-10-CM | POA: Diagnosis not present

## 2020-04-17 DIAGNOSIS — M7918 Myalgia, other site: Secondary | ICD-10-CM | POA: Diagnosis not present

## 2020-04-17 DIAGNOSIS — M4802 Spinal stenosis, cervical region: Secondary | ICD-10-CM | POA: Diagnosis not present

## 2020-04-17 DIAGNOSIS — Z79891 Long term (current) use of opiate analgesic: Secondary | ICD-10-CM | POA: Diagnosis not present

## 2020-04-17 DIAGNOSIS — M47816 Spondylosis without myelopathy or radiculopathy, lumbar region: Secondary | ICD-10-CM | POA: Diagnosis not present

## 2020-04-17 DIAGNOSIS — M961 Postlaminectomy syndrome, not elsewhere classified: Secondary | ICD-10-CM | POA: Diagnosis not present

## 2020-05-01 ENCOUNTER — Ambulatory Visit (INDEPENDENT_AMBULATORY_CARE_PROVIDER_SITE_OTHER): Payer: Federal, State, Local not specified - PPO | Admitting: Physician Assistant

## 2020-05-01 ENCOUNTER — Encounter: Payer: Self-pay | Admitting: Physician Assistant

## 2020-05-01 VITALS — Ht 65.0 in | Wt 260.0 lb

## 2020-05-01 DIAGNOSIS — M9261 Juvenile osteochondrosis of tarsus, right ankle: Secondary | ICD-10-CM | POA: Diagnosis not present

## 2020-05-01 NOTE — Progress Notes (Signed)
Office Visit Note   Patient: Pamela Esparza           Date of Birth: 1966-01-25           MRN: [id number] Visit Date: 05/01/2020              Requested by: Esaw Grandchild, NP [place],  [place] [address] PCP: Patient, No Pcp Per  Chief Complaint  Patient presents with  . Right Ankle - Follow-up    11/06/19 right peroneal tendon recon      HPI: This is a pleasant woman who is 6 months status post right peroneal tendon reconstruction.  She has been doing well and had a reinjury when she rolled her ankle.  Findings were consistent with an Achilles insertional and Achilles tendinitis.  She went back to wearing her fracture boot with a heel lift.  She reports she is doing much better today she still has some pain but she is weaning out of the boot  Assessment & Plan: Visit Diagnoses: No diagnosis found.  Plan: She does not think she needs physical therapy.  I did give her some heel lifts and had her use the thick ones and then wean down to a thinner pair.  She will follow-up in 4 weeks  Follow-Up Instructions: No follow-ups on file.   Ortho Exam  Patient is alert, oriented, no adenopathy, well-dressed, normal affect, normal respiratory effort. Focused examination of her right ankle well-healed surgical incision from previous surgery she still has some mild tenderness in the Achilles tendon.  The Achilles tendon is intact throughout its entirety she has good plantar flexion and dorsiflexion strength.  No soft tissue swelling no cellulitis no evidence of infection  Imaging: No results found. No images are attached to the encounter.  Labs: Lab Results  Component Value Date   HGBA1C 5.4 03/21/2019     Lab Results  Component Value Date   ALBUMIN 4.5 09/26/2019   ALBUMIN 4.4 03/21/2019    No results found for: MG Lab Results  Component Value Date   VD25OH 38.3 08/14/2019    No results found for: PREALBUMIN CBC EXTENDED Latest Ref Rng & Units 03/21/2019    WBC 3.4 - 10.8 x10E3/uL 7.0  RBC 3.77 - 5.28 x10E6/uL 4.61  HGB 11.1 - 15.9 g/dL 14.0  HCT 34.0 - 46.6 % 41.6  PLT 150 - 450 x10E3/uL 308  NEUTROABS 1 - 7 x10E3/uL 3.8  LYMPHSABS 0 - 3 x10E3/uL 2.5     Body mass index is 43.27 kg/m.  Orders:  No orders of the defined types were placed in this encounter.  No orders of the defined types were placed in this encounter.    Procedures: No procedures performed  Clinical Data: No additional findings.  ROS:  All other systems negative, except as noted in the HPI. Review of Systems  Objective: Vital Signs: Ht 5\' 5"  (1.651 m)   Wt 260 lb (117.9 kg)   BMI 43.27 kg/m   Specialty Comments:  No specialty comments available.  PMFS History: Patient Active Problem List   Diagnosis Date Noted  . Peroneal tendinitis, right leg   . Constipation 08/14/2019  . Multiple atypical nevi 08/14/2019  . Haglund's deformity of right heel 06/25/2019  . Concussion 06/21/2019  . Acute right ankle pain 03/21/2019  . Hypothyroidism 03/21/2019  . Sore throat 01/23/2019  . Acute otitis media 01/23/2019  . Cough in adult 12/20/2018  . Healthcare maintenance 10/23/2018  . Chronic  joint pain 10/23/2018  . Breast cancer screening 10/23/2018  . DDD (degenerative disc disease), lumbar 10/23/2018  . Muscle pain, fibromyalgia 10/23/2018   Past Medical History:  Diagnosis Date  . Anxiety   . Arthritis   . Asthma   . Cataract   . DDD (degenerative disc disease), lumbar   . Depression   . Fibromyalgia   . Hyperlipidemia   . Joint pain   . Neuropathy   . Sjogren's disease ([place])   . Thyroid disease     Family History  Problem Relation Age of Onset  . Cancer Mother   . Heart attack Father   . Hypertension Father   . Diabetes Father   . Birth defects Maternal Grandmother        ovarian  . Depression Maternal Grandmother   . Birth defects Paternal Grandmother        brain  . Colon cancer Neg Hx   . Colon polyps Neg Hx   . Esophageal  cancer Neg Hx   . Stomach cancer Neg Hx   . Rectal cancer Neg Hx     Past Surgical History:  Procedure Laterality Date  . ABDOMINAL HYSTERECTOMY    . APPENDECTOMY    . CHOLECYSTECTOMY    . finger sugery    . NECK SURGERY    . TENDON TRANSFER Right 11/06/2019   Procedure: PERONEAL RECONSTRUCTION WITH PERONEAL LONGUS RIGHT LEG;  Surgeon: Newt Minion, MD;  Location: [facility];  Service: Orthopedics;  Laterality: Right;   Social History   Occupational History  . Not on file  Tobacco Use  . Smoking status: Never Smoker  . Smokeless tobacco: Never Used  Vaping Use  . Vaping Use: Never used  Substance and Sexual Activity  . Alcohol use: Yes    Comment: 4 drinks a year-rare  . Drug use: Never  . Sexual activity: Not Currently

## 2020-05-05 ENCOUNTER — Other Ambulatory Visit: Payer: Self-pay | Admitting: Physician Assistant

## 2020-05-05 DIAGNOSIS — E7800 Pure hypercholesterolemia, unspecified: Secondary | ICD-10-CM

## 2020-05-12 ENCOUNTER — Other Ambulatory Visit: Payer: Self-pay | Admitting: Physician Assistant

## 2020-05-12 DIAGNOSIS — Z6841 Body Mass Index (BMI) 40.0 and over, adult: Secondary | ICD-10-CM | POA: Diagnosis not present

## 2020-05-12 DIAGNOSIS — M7989 Other specified soft tissue disorders: Secondary | ICD-10-CM | POA: Diagnosis not present

## 2020-05-12 DIAGNOSIS — E039 Hypothyroidism, unspecified: Secondary | ICD-10-CM

## 2020-05-12 DIAGNOSIS — M3500 Sicca syndrome, unspecified: Secondary | ICD-10-CM | POA: Diagnosis not present

## 2020-05-15 DIAGNOSIS — M797 Fibromyalgia: Secondary | ICD-10-CM | POA: Diagnosis not present

## 2020-05-15 DIAGNOSIS — G3184 Mild cognitive impairment, so stated: Secondary | ICD-10-CM | POA: Diagnosis not present

## 2020-05-15 DIAGNOSIS — R42 Dizziness and giddiness: Secondary | ICD-10-CM | POA: Diagnosis not present

## 2020-06-10 ENCOUNTER — Ambulatory Visit: Payer: Self-pay

## 2020-06-10 ENCOUNTER — Ambulatory Visit (INDEPENDENT_AMBULATORY_CARE_PROVIDER_SITE_OTHER): Payer: Federal, State, Local not specified - PPO | Admitting: Orthopedic Surgery

## 2020-06-10 DIAGNOSIS — G8929 Other chronic pain: Secondary | ICD-10-CM

## 2020-06-10 DIAGNOSIS — M25562 Pain in left knee: Secondary | ICD-10-CM | POA: Diagnosis not present

## 2020-06-10 DIAGNOSIS — M25561 Pain in right knee: Secondary | ICD-10-CM | POA: Diagnosis not present

## 2020-06-12 DIAGNOSIS — M47816 Spondylosis without myelopathy or radiculopathy, lumbar region: Secondary | ICD-10-CM | POA: Diagnosis not present

## 2020-06-12 DIAGNOSIS — G5720 Lesion of femoral nerve, unspecified lower limb: Secondary | ICD-10-CM | POA: Diagnosis not present

## 2020-06-12 DIAGNOSIS — M47812 Spondylosis without myelopathy or radiculopathy, cervical region: Secondary | ICD-10-CM | POA: Diagnosis not present

## 2020-06-12 DIAGNOSIS — G588 Other specified mononeuropathies: Secondary | ICD-10-CM | POA: Diagnosis not present

## 2020-06-12 DIAGNOSIS — M961 Postlaminectomy syndrome, not elsewhere classified: Secondary | ICD-10-CM | POA: Diagnosis not present

## 2020-06-12 DIAGNOSIS — M545 Low back pain: Secondary | ICD-10-CM | POA: Diagnosis not present

## 2020-06-12 DIAGNOSIS — Z79891 Long term (current) use of opiate analgesic: Secondary | ICD-10-CM | POA: Diagnosis not present

## 2020-06-12 DIAGNOSIS — M461 Sacroiliitis, not elsewhere classified: Secondary | ICD-10-CM | POA: Diagnosis not present

## 2020-06-12 DIAGNOSIS — M7918 Myalgia, other site: Secondary | ICD-10-CM | POA: Diagnosis not present

## 2020-06-12 DIAGNOSIS — M5136 Other intervertebral disc degeneration, lumbar region: Secondary | ICD-10-CM | POA: Diagnosis not present

## 2020-06-12 DIAGNOSIS — M5030 Other cervical disc degeneration, unspecified cervical region: Secondary | ICD-10-CM | POA: Diagnosis not present

## 2020-06-12 DIAGNOSIS — M533 Sacrococcygeal disorders, not elsewhere classified: Secondary | ICD-10-CM | POA: Diagnosis not present

## 2020-06-18 ENCOUNTER — Encounter: Payer: Self-pay | Admitting: Orthopedic Surgery

## 2020-06-18 NOTE — Progress Notes (Signed)
Office Visit Note   Patient: Pamela Esparza           Date of Birth: 02-Mar-1966           MRN: [id number] Visit Date: 06/10/2020              Requested by: No referring provider defined for this encounter. PCP: Patient, No Pcp Per  Chief Complaint  Patient presents with  . Right Ankle - Follow-up    11/06/19 right peroneal  tendon reconstruction       HPI: Patient is a 54 year old woman who presents for 2 separate issues #1 she has had bilateral knee pain right worse than left that is chronic she states that injections about a month ago with pain management did not help she states she wakes up at night with pain and swelling in her knees.  Patient is status post peroneal tendon reconstruction on the right approximately 7 months ago.  Patient states that she has some swelling and the tendon is tight and causes the toes on top of her foot to her.  Assessment & Plan: Visit Diagnoses:  1. Chronic pain of both knees     Plan: With the bony spurs in the patellofemoral joint I have recommended physical therapy and this was arranged.  Also patient will work on ankle strengthening to decrease the tightness in the peroneal tendons.  Follow-Up Instructions: Return in about 4 weeks (around 07/08/2020).   Ortho Exam  Patient is alert, oriented, no adenopathy, well-dressed, normal affect, normal respiratory effort. Examination patient ambulates using a cane.  Examination of both knees there is no effusion no redness.  There is no mechanical symptoms no tenderness to palpation of medial lateral joint line pain is primarily in the patellofemoral joint.  Examination the right ankle the peroneal tendon function is intact there is some swelling no redness no cellulitis no open ulcers.  Imaging: No results found. No images are attached to the encounter.  Labs: Lab Results  Component Value Date   HGBA1C 5.4 03/21/2019     Lab Results  Component Value Date   ALBUMIN 4.5 09/26/2019    ALBUMIN 4.4 03/21/2019    No results found for: MG Lab Results  Component Value Date   VD25OH 38.3 08/14/2019    No results found for: PREALBUMIN CBC EXTENDED Latest Ref Rng & Units 03/21/2019  WBC 3.4 - 10.8 x10E3/uL 7.0  RBC 3.77 - 5.28 x10E6/uL 4.61  HGB 11.1 - 15.9 g/dL 14.0  HCT 34.0 - 46.6 % 41.6  PLT 150 - 450 x10E3/uL 308  NEUTROABS 1 - 7 x10E3/uL 3.8  LYMPHSABS 0 - 3 x10E3/uL 2.5     There is no height or weight on file to calculate BMI.  Orders:  Orders Placed This Encounter  Procedures  . XR Knee 1-2 Views Right  . XR Knee 1-2 Views Left  . Ambulatory referral to Physical Therapy   No orders of the defined types were placed in this encounter.    Procedures: No procedures performed  Clinical Data: No additional findings.  ROS:  All other systems negative, except as noted in the HPI. Review of Systems  Objective: Vital Signs: There were no vitals taken for this visit.  Specialty Comments:  No specialty comments available.  PMFS History: Patient Active Problem List   Diagnosis Date Noted  . Peroneal tendinitis, right leg   . Constipation 08/14/2019  . Multiple atypical nevi 08/14/2019  . Haglund's deformity of right heel 06/25/2019  .  Concussion 06/21/2019  . Acute right ankle pain 03/21/2019  . Hypothyroidism 03/21/2019  . Sore throat 01/23/2019  . Acute otitis media 01/23/2019  . Cough in adult 12/20/2018  . Healthcare maintenance 10/23/2018  . Chronic joint pain 10/23/2018  . Breast cancer screening 10/23/2018  . DDD (degenerative disc disease), lumbar 10/23/2018  . Muscle pain, fibromyalgia 10/23/2018   Past Medical History:  Diagnosis Date  . Anxiety   . Arthritis   . Asthma   . Cataract   . DDD (degenerative disc disease), lumbar   . Depression   . Fibromyalgia   . Hyperlipidemia   . Joint pain   . Neuropathy   . Sjogren's disease ([place])   . Thyroid disease     Family History  Problem Relation Age of Onset  . Cancer  Mother   . Heart attack Father   . Hypertension Father   . Diabetes Father   . Birth defects Maternal Grandmother        ovarian  . Depression Maternal Grandmother   . Birth defects Paternal Grandmother        brain  . Colon cancer Neg Hx   . Colon polyps Neg Hx   . Esophageal cancer Neg Hx   . Stomach cancer Neg Hx   . Rectal cancer Neg Hx     Past Surgical History:  Procedure Laterality Date  . ABDOMINAL HYSTERECTOMY    . APPENDECTOMY    . CHOLECYSTECTOMY    . finger sugery    . NECK SURGERY    . TENDON TRANSFER Right 11/06/2019   Procedure: PERONEAL RECONSTRUCTION WITH PERONEAL LONGUS RIGHT LEG;  Surgeon: Newt Minion, MD;  Location: [facility];  Service: Orthopedics;  Laterality: Right;   Social History   Occupational History  . Not on file  Tobacco Use  . Smoking status: Never Smoker  . Smokeless tobacco: Never Used  Vaping Use  . Vaping Use: Never used  Substance and Sexual Activity  . Alcohol use: Yes    Comment: 4 drinks a year-rare  . Drug use: Never  . Sexual activity: Not Currently

## 2020-06-30 ENCOUNTER — Encounter: Payer: Self-pay | Admitting: Rehabilitative and Restorative Service Providers"

## 2020-06-30 ENCOUNTER — Other Ambulatory Visit: Payer: Self-pay | Admitting: General Practice

## 2020-06-30 ENCOUNTER — Ambulatory Visit (INDEPENDENT_AMBULATORY_CARE_PROVIDER_SITE_OTHER): Payer: Federal, State, Local not specified - PPO | Admitting: Rehabilitative and Restorative Service Providers"

## 2020-06-30 ENCOUNTER — Other Ambulatory Visit: Payer: Self-pay

## 2020-06-30 DIAGNOSIS — M25662 Stiffness of left knee, not elsewhere classified: Secondary | ICD-10-CM | POA: Diagnosis not present

## 2020-06-30 DIAGNOSIS — G8929 Other chronic pain: Secondary | ICD-10-CM | POA: Diagnosis not present

## 2020-06-30 DIAGNOSIS — R600 Localized edema: Secondary | ICD-10-CM

## 2020-06-30 DIAGNOSIS — M25561 Pain in right knee: Secondary | ICD-10-CM

## 2020-06-30 DIAGNOSIS — M25571 Pain in right ankle and joints of right foot: Secondary | ICD-10-CM | POA: Diagnosis not present

## 2020-06-30 DIAGNOSIS — M6281 Muscle weakness (generalized): Secondary | ICD-10-CM | POA: Diagnosis not present

## 2020-06-30 DIAGNOSIS — R262 Difficulty in walking, not elsewhere classified: Secondary | ICD-10-CM | POA: Diagnosis not present

## 2020-06-30 DIAGNOSIS — M25671 Stiffness of right ankle, not elsewhere classified: Secondary | ICD-10-CM

## 2020-06-30 DIAGNOSIS — M25661 Stiffness of right knee, not elsewhere classified: Secondary | ICD-10-CM

## 2020-06-30 DIAGNOSIS — M25562 Pain in left knee: Secondary | ICD-10-CM | POA: Diagnosis not present

## 2020-06-30 NOTE — Patient Instructions (Signed)
Access Code: [facility] URL: [email or web address] Date: 06/30/2020 Prepared by: Vista Mink  Exercises Small Range Straight Leg Raise - 1 x daily - 7 x weekly - 6-10 sets - 5 reps - 3 secondsinutes hold Slant Board Gastrocnemius Stretch - 3 x daily - 7 x weekly - 1 sets - 3 reps - 60 hold Standing Gastroc Stretch at Counter - 2-3 x daily - 7 x weekly - 1 sets - 5 reps - 20 seconds hold Standing Tandem Balance with Counter Support - 2-3 x daily - 7 x weekly - 1 sets - 5 reps - 20 seconds hold

## 2020-06-30 NOTE — Therapy (Addendum)
[facility] Physical Therapy [address] [place], [place], [address] Phone: [phone]   Fax:  [phone]  Physical Therapy Evaluation  Patient Details  Name: Pamela Esparza MRN: [id number] Date of Birth: 08-Jul-1966 Referring Provider (PT): Newt Minion MD   Encounter Date: 06/30/2020   PT End of Session - 06/30/20 1649    Visit Number 1    Number of Visits 16    PT Start Time 0930    PT Stop Time 2297    PT Time Calculation (min) 45 min    Activity Tolerance Patient tolerated treatment well;No increased pain    Behavior During Therapy WFL for tasks assessed/performed           Past Medical History:  Diagnosis Date  . Anxiety   . Arthritis   . Asthma   . Cataract   . DDD (degenerative disc disease), lumbar   . Depression   . Fibromyalgia   . Hyperlipidemia   . Joint pain   . Neuropathy   . Sjogren's disease ([place])   . Thyroid disease     Past Surgical History:  Procedure Laterality Date  . ABDOMINAL HYSTERECTOMY    . APPENDECTOMY    . CHOLECYSTECTOMY    . finger sugery    . NECK SURGERY    . TENDON TRANSFER Right 11/06/2019   Procedure: PERONEAL RECONSTRUCTION WITH PERONEAL LONGUS RIGHT LEG;  Surgeon: Newt Minion, MD;  Location: [place];  Service: Orthopedics;  Laterality: Right;    There were no vitals filed for this visit.    Subjective Assessment - 06/30/20 1644    Subjective Pamela Esparza has B knee OA and is still recovering from a R ankle peroneal reconstruction.  She is limited with WB function due to B knee and R anterior and lateral ankle pain and weakness.    Patient Stated Goals Improve standing and walking endurance and comfort.    Currently in Pain? Yes    Pain Score 5     Pain Location Knee    Pain Orientation Left;Right    Pain Descriptors / Indicators Aching;Sore;Tightness    Pain Type Chronic pain    Pain Onset More than a month ago    Pain Frequency Constant    Aggravating Factors  Too much WB  and changes in the weather.    Pain Relieving Factors Meds help some.    Effect of Pain on Daily Activities Limits standing and walking endurance.              [facility] PT Assessment - 06/30/20 0001      Assessment   Medical Diagnosis B knee OA and R ankle pain    Referring Provider (PT) Newt Minion MD    Onset Date/Surgical Date 11/06/19    Next MD Visit November      Balance Screen   Has the patient fallen in the past 6 months Yes    How many times? 1    Has the patient had a decrease in activity level because of a fear of falling?  No    Is the patient reluctant to leave their home because of a fear of falling?  No      Prior Function   Level of Independence Independent      Cognition   Overall Cognitive Status Within Functional Limits for tasks assessed      Observation/Other Assessments   Focus on Therapeutic Outcomes (FOTO)  29  ROM / Strength   AROM / PROM / Strength AROM;Strength      AROM   Overall AROM  Deficits    AROM Assessment Site Ankle;Knee    Right/Left Knee Left;Right    Right Knee Extension 0    Right Knee Flexion 117    Left Knee Extension 0    Left Knee Flexion 125    Right/Left Ankle Left;Right    Right Ankle Dorsiflexion 5    Left Ankle Dorsiflexion 5      Strength   Overall Strength Deficits    Strength Assessment Site Ankle;Knee    Right/Left Knee Left;Right    Right Knee Extension 4/5    Left Knee Extension 4/5    Right/Left Ankle Left;Right    Right Ankle Dorsiflexion 4/5    Right Ankle Inversion 4/5    Right Ankle Eversion 4-/5    Left Ankle Dorsiflexion 5/5    Left Ankle Inversion 5/5    Left Ankle Eversion 5/5                      Objective measurements completed on examination: See above findings.       [facility] Adult PT Treatment/Exercise - 06/30/20 0001      Neuro Re-ed    Neuro Re-ed Details  Tandem balance 5X 20 seconds      Exercises   Exercises Knee/Hip;Ankle      Knee/Hip Exercises: Seated    Long Arc Quad Strengthening;Both;3 sets;5 reps   Seated straight leg raises slow eccentrics     Ankle Exercises: Stretches   Gastroc Stretch 5 reps;20 seconds    Slant Board Stretch 3 reps;60 seconds                  PT Education - 06/30/20 1647    Education Details Talked about the likelihood that R ankle pain will improve with improved strength and dorsiflexion AROM.  Discussed the importance of quadriceps strengthening and activity modification with her knees.  Prescribed and reviewed an appropriate HEP.    Person(s) Educated Patient    Methods Explanation;Demonstration;Verbal cues;Handout    Comprehension Verbalized understanding;Returned demonstration;Need further instruction;Verbal cues required               PT Long Term Goals - 06/30/20 1654      PT LONG TERM GOAL #1   Title Improve FOTO Functional Score to 50.    Baseline 21    Time 8    Period Weeks    Status New    Target Date 08/22/20      PT LONG TERM GOAL #2   Title Improve R ankle pain to 0-2/10 and B knee pain to consistently 0-3/10 on the Numeric Pain Rating Scale.    Baseline 5/10    Time 8    Period Weeks    Status New    Target Date 08/22/20      PT LONG TERM GOAL #3   Title Improve R ankle and B quadriceps strength to 5/5 MMT.    Baseline 4/5 to 4+/5 MMT    Time 8    Period Weeks    Status New    Target Date 08/22/20      PT LONG TERM GOAL #4   Title Improve B knee flexion AROM to 125 degrees and B ankle dorsiflexion to 10 degrees.    Baseline See objective.    Time 8    Period Weeks    Status New  Target Date 08/22/20      PT LONG TERM GOAL #5   Title Pamela Esparza will be independent with her long-term HEP at [place].    Time 8    Period Weeks    Status New    Target Date 08/22/20                  Plan - 06/30/20 1650    Clinical Impression Statement Pamela Esparza has 2 arthritic knees and a weak/stiff R ankle.  Addressing R ankle dorsiflexion AROM, stabilizing strength and  balance should result in an excellent outcome.  B knee arthritis limits WB function.  Quadriceps strengthening should improve this and pain on a day to day basis.  Overuse or changes in the weather will continue to irritate Pamela Esparza's knees.    Personal Factors and Comorbidities Comorbidity 1    Comorbidities Fibromyalgia    Examination-Activity Limitations Carry;Stand;Stairs;Locomotion Level;Squat;Lift    Examination-Participation Restrictions Community Activity    Stability/Clinical Decision Making Stable/Uncomplicated    Clinical Decision Making Low    Rehab Potential Good    PT Frequency 2x / week    PT Duration 8 weeks    PT Treatment/Interventions ADLs/Self Care Home Management;Cryotherapy;Therapeutic activities;Stair training;Gait training;Therapeutic exercise;Balance training;Neuromuscular re-education;Patient/family education;Vasopneumatic Device    PT Next Visit Plan Progress ankle and quadriceps strengthening.    PT Home Exercise Plan Access Code: [place] and Agree with Plan of Care Patient           Patient will benefit from skilled therapeutic intervention in order to improve the following deficits and impairments:  Abnormal gait, Decreased activity tolerance, Decreased balance, Decreased endurance, Decreased range of motion, Decreased strength, Difficulty walking, Impaired flexibility, Pain, Increased edema  Visit Diagnosis: Difficulty walking  Chronic pain of left knee  Chronic pain of right knee  Pain in right ankle and joints of right foot  Muscle weakness (generalized)  Stiffness of left knee, not elsewhere classified  Stiffness of right knee, not elsewhere classified  Stiffness of right ankle, not elsewhere classified   Added localized edema     Problem List Patient Active Problem List   Diagnosis Date Noted  . Peroneal tendinitis, right leg   . Constipation 08/14/2019  . Multiple atypical nevi 08/14/2019  . Haglund's deformity of right  heel 06/25/2019  . Concussion 06/21/2019  . Acute right ankle pain 03/21/2019  . Hypothyroidism 03/21/2019  . Sore throat 01/23/2019  . Acute otitis media 01/23/2019  . Cough in adult 12/20/2018  . Healthcare maintenance 10/23/2018  . Chronic joint pain 10/23/2018  . Breast cancer screening 10/23/2018  . DDD (degenerative disc disease), lumbar 10/23/2018  . Muscle pain, fibromyalgia 10/23/2018    Farley Ly PT, MPT 06/30/2020, 5:00 PM  [facility] Physical Therapy [address] [place], [place], [address] Phone: [phone]   Fax:  [phone]  Name: Pamela Esparza MRN: [id number] Date of Birth: 01-07-1966

## 2020-07-01 ENCOUNTER — Other Ambulatory Visit: Payer: Federal, State, Local not specified - PPO

## 2020-07-01 ENCOUNTER — Ambulatory Visit (INDEPENDENT_AMBULATORY_CARE_PROVIDER_SITE_OTHER): Payer: Federal, State, Local not specified - PPO | Admitting: Physical Therapy

## 2020-07-01 ENCOUNTER — Encounter: Payer: Self-pay | Admitting: Physical Therapy

## 2020-07-01 DIAGNOSIS — M25561 Pain in right knee: Secondary | ICD-10-CM

## 2020-07-01 DIAGNOSIS — M25562 Pain in left knee: Secondary | ICD-10-CM

## 2020-07-01 DIAGNOSIS — M6281 Muscle weakness (generalized): Secondary | ICD-10-CM

## 2020-07-01 DIAGNOSIS — M25571 Pain in right ankle and joints of right foot: Secondary | ICD-10-CM

## 2020-07-01 DIAGNOSIS — M25662 Stiffness of left knee, not elsewhere classified: Secondary | ICD-10-CM | POA: Diagnosis not present

## 2020-07-01 DIAGNOSIS — M25661 Stiffness of right knee, not elsewhere classified: Secondary | ICD-10-CM

## 2020-07-01 DIAGNOSIS — R262 Difficulty in walking, not elsewhere classified: Secondary | ICD-10-CM | POA: Diagnosis not present

## 2020-07-01 DIAGNOSIS — M25671 Stiffness of right ankle, not elsewhere classified: Secondary | ICD-10-CM | POA: Diagnosis not present

## 2020-07-01 DIAGNOSIS — G8929 Other chronic pain: Secondary | ICD-10-CM | POA: Diagnosis not present

## 2020-07-01 DIAGNOSIS — R600 Localized edema: Secondary | ICD-10-CM | POA: Diagnosis not present

## 2020-07-01 NOTE — Therapy (Signed)
[facility] [address] [place], [place], [address] Phone: [phone]   Fax:  [phone]  Physical Therapy Treatment  Patient Details  Name: Pamela Esparza MRN: [id number] Date of Birth: November 17, 1965 Referring Provider (PT): Newt Minion MD   Encounter Date: 07/01/2020   PT End of Session - 07/01/20 0930    Visit Number 2    Number of Visits 16    PT Start Time 0930    PT Stop Time 4193    PT Time Calculation (min) 45 min    Activity Tolerance Patient tolerated treatment well;No increased pain    Behavior During Therapy WFL for tasks assessed/performed           Past Medical History:  Diagnosis Date  . Anxiety   . Arthritis   . Asthma   . Cataract   . DDD (degenerative disc disease), lumbar   . Depression   . Fibromyalgia   . Hyperlipidemia   . Joint pain   . Neuropathy   . Sjogren's disease ([place])   . Thyroid disease     Past Surgical History:  Procedure Laterality Date  . ABDOMINAL HYSTERECTOMY    . APPENDECTOMY    . CHOLECYSTECTOMY    . finger sugery    . NECK SURGERY    . TENDON TRANSFER Right 11/06/2019   Procedure: PERONEAL RECONSTRUCTION WITH PERONEAL LONGUS RIGHT LEG;  Surgeon: Newt Minion, MD;  Location: [facility];  Service: Orthopedics;  Laterality: Right;    There were no vitals filed for this visit.   Subjective Assessment - 07/01/20 0930    Subjective Pamela Esparza did exercises without issues.    Patient Stated Goals Improve standing and walking endurance and comfort.    Currently in Pain? Yes    Pain Score 4     Pain Location Ankle    Pain Orientation Right    Pain Descriptors / Indicators Aching    Pain Type Chronic pain    Pain Onset More than a month ago    Pain Frequency Constant    Aggravating Factors  walking or standing >5-10 minutes    Pain Relieving Factors sitting to rest & medications                             [facility] Adult PT Treatment/Exercise - 07/01/20  0930      Ambulation/Gait   Ambulation/Gait Yes      Self-Care   Self-Care Heat/Ice Application    Heat/Ice Application use of ice bath seated. Pt verbalized understanding how to set up at home.       Neuro Re-ed    Neuro Re-ed Details  Tandem balance 5X 20 seconds      Exercises   Exercises Knee/Hip;Ankle      Knee/Hip Exercises: Standing   Hip Flexion Left;Stengthening;1 set;15 reps;Knee straight;Other (comment)   working on closed chain RLE strengthening   Hip Flexion Limitations demo & verbal cues on technique    Hip Abduction Stengthening;Left;1 set;15 reps;Knee straight   working on closed chain RLE strengthening   Abduction Limitations demo & verbal cues on technique    Hip Extension Stengthening;Left;1 set;15 reps;Knee straight   working on closed chain RLE strengthening   Extension Limitations demo & verbal cues on technique      Modalities   Modalities Vasopneumatic      Vasopneumatic   Number Minutes Vasopneumatic  10 minutes  Vasopnuematic Location  Ankle    Vasopneumatic Pressure High    Vasopneumatic Temperature  34      Ankle Exercises: Stretches   Gastroc Stretch 3 reps;30 seconds;Limitations      Ankle Exercises: Seated   ABC's 1 rep    Other Seated Ankle Exercises seated with foot on 8" foot stool with red theraband 15 reps ea. :  inversion,  eversion,  PF w/inversion & PF w/eversion.       Ankle Exercises: Standing   Heel Raises Both;15 reps;5 seconds    Heel Raises Limitations BUE support with cues to avoid lifting    Toe Raise 15 reps;3 seconds    Toe Raise Limitations alternating DF & PF with balance issues toe raises.     Other Standing Ankle Exercises standing on foam in corner feet together EO head movements right/left & up/down 10 reps ea.     Other Standing Ankle Exercises standing on foam in corner feet together EC 10 sec 3 reps                  PT Education - 07/01/20 1100    Education Details updated HEP Access Code: [place]      Person(s) Educated Patient    Methods Explanation;Demonstration;Tactile cues;Verbal cues;Handout    Comprehension Verbalized understanding;Returned demonstration               PT Long Term Goals - 06/30/20 1654      PT LONG TERM GOAL #1   Title Improve FOTO Functional Score to 50.    Baseline 21    Time 8    Period Weeks    Status New    Target Date 08/22/20      PT LONG TERM GOAL #2   Title Improve R ankle pain to 0-2/10 and B knee pain to consistently 0-3/10 on the Numeric Pain Rating Scale.    Baseline 5/10    Time 8    Period Weeks    Status New    Target Date 08/22/20      PT LONG TERM GOAL #3   Title Improve R ankle and B quadriceps strength to 5/5 MMT.    Baseline 4/5 to 4+/5 MMT    Time 8    Period Weeks    Status New    Target Date 08/22/20      PT LONG TERM GOAL #4   Title Improve B knee flexion AROM to 125 degrees and B ankle dorsiflexion to 10 degrees.    Baseline See objective.    Time 8    Period Weeks    Status New    Target Date 08/22/20      PT LONG TERM GOAL #5   Title Pamela Esparza will be independent with her long-term HEP at [place].    Time 8    Period Weeks    Status New    Target Date 08/22/20                 Plan - 07/01/20 0930    Clinical Impression Statement PT reviewed & updated HEP which Pamela Esparza appears to understand.  PT also instructed in therapuetic exercise for ankle strengthening.  Patient's ankle had slight increased edema after exercise but responded well to vasopneumatic ice.    Personal Factors and Comorbidities Comorbidity 1    Comorbidities Fibromyalgia    Examination-Activity Limitations Carry;Stand;Stairs;Locomotion Level;Squat;Lift    Examination-Participation Restrictions Community Activity    Stability/Clinical Decision Making Stable/Uncomplicated  Rehab Potential Good    PT Frequency 2x / week    PT Duration 8 weeks    PT Treatment/Interventions ADLs/Self Care Home Management;Cryotherapy;Therapeutic  activities;Stair training;Gait training;Therapeutic exercise;Balance training;Neuromuscular re-education;Patient/family education;Vasopneumatic Device    PT Next Visit Plan Progress ankle and quadriceps strengthening.    PT Home Exercise Plan Access Code: [place] and Agree with Plan of Care Patient           Patient will benefit from skilled therapeutic intervention in order to improve the following deficits and impairments:  Abnormal gait, Decreased activity tolerance, Decreased balance, Decreased endurance, Decreased range of motion, Decreased strength, Difficulty walking, Impaired flexibility, Pain, Increased edema  Visit Diagnosis: Difficulty walking  Chronic pain of left knee  Chronic pain of right knee  Pain in right ankle and joints of right foot  Muscle weakness (generalized)  Stiffness of left knee, not elsewhere classified  Stiffness of right knee, not elsewhere classified  Stiffness of right ankle, not elsewhere classified  Localized edema     Problem List Patient Active Problem List   Diagnosis Date Noted  . Peroneal tendinitis, right leg   . Constipation 08/14/2019  . Multiple atypical nevi 08/14/2019  . Haglund's deformity of right heel 06/25/2019  . Concussion 06/21/2019  . Acute right ankle pain 03/21/2019  . Hypothyroidism 03/21/2019  . Sore throat 01/23/2019  . Acute otitis media 01/23/2019  . Cough in adult 12/20/2018  . Healthcare maintenance 10/23/2018  . Chronic joint pain 10/23/2018  . Breast cancer screening 10/23/2018  . DDD (degenerative disc disease), lumbar 10/23/2018  . Muscle pain, fibromyalgia 10/23/2018    Jamey Reas PT, DPT 07/01/2020, 11:14 AM  [facility] [address] [place], [place], [address] Phone: [phone]   Fax:  [phone]  Name: Pamela Esparza MRN: [id number] Date of Birth: 04-Dec-1965

## 2020-07-01 NOTE — Patient Instructions (Signed)
Access Code: [facility] URL: [email or web address] Date: 07/01/2020 Prepared by: Jamey Reas  Exercises Small Range Straight Leg Raise - 1 x daily - 7 x weekly - 6-10 sets - 5 reps - 3 secondsinutes hold Slant Board Gastrocnemius Stretch - 3 x daily - 7 x weekly - 1 sets - 3 reps - 60 hold Standing Gastroc Stretch at Counter - 2-3 x daily - 7 x weekly - 1 sets - 5 reps - 20 seconds hold Standing Tandem Balance with Counter Support - 2-3 x daily - 7 x weekly - 1 sets - 5 reps - 20 seconds hold Feet Together Balance with Head Nods on Foam Pad - 1 x daily - 7 x weekly - 1 sets - 10 reps - 5 seconds hold Feet Together standing on foam Eyes Closed (no head moving) - 1 x daily - 7 x weekly - 1 sets - 3 reps - 10 seconds hold Standing Hip Abduction with Counter Support - 1 x daily - 7 x weekly - 1 sets - 10-15 reps - 5 seconds hold Standing Hip Extension with Counter Support - 1 x daily - 7 x weekly - 1 sets - 10-15 reps - 5 seconds hold Standing Hip Flexion with Counter Support - 1 x daily - 7 x weekly - 1 sets - 10-15 reps - 5 seconds hold Heel raises in corner - 1 x daily - 7 x weekly - 1 sets - 10-15 reps - 5 seconds hold Heel Toe Raises with Counter Support - 1 x daily - 7 x weekly - 1 sets - 10-15 reps - 5 seconds hold

## 2020-07-04 NOTE — Addendum Note (Signed)
Addended by: Maricela Bo on: 07/04/2020 08:39 AM   Modules accepted: Orders

## 2020-07-07 ENCOUNTER — Ambulatory Visit (INDEPENDENT_AMBULATORY_CARE_PROVIDER_SITE_OTHER): Payer: Federal, State, Local not specified - PPO | Admitting: Physical Therapy

## 2020-07-07 ENCOUNTER — Other Ambulatory Visit: Payer: Self-pay

## 2020-07-07 ENCOUNTER — Encounter: Payer: Self-pay | Admitting: Physical Therapy

## 2020-07-07 DIAGNOSIS — R262 Difficulty in walking, not elsewhere classified: Secondary | ICD-10-CM

## 2020-07-07 DIAGNOSIS — M25562 Pain in left knee: Secondary | ICD-10-CM | POA: Diagnosis not present

## 2020-07-07 DIAGNOSIS — M25662 Stiffness of left knee, not elsewhere classified: Secondary | ICD-10-CM

## 2020-07-07 DIAGNOSIS — R600 Localized edema: Secondary | ICD-10-CM

## 2020-07-07 DIAGNOSIS — M25571 Pain in right ankle and joints of right foot: Secondary | ICD-10-CM | POA: Diagnosis not present

## 2020-07-07 DIAGNOSIS — M25561 Pain in right knee: Secondary | ICD-10-CM

## 2020-07-07 DIAGNOSIS — G8929 Other chronic pain: Secondary | ICD-10-CM

## 2020-07-07 DIAGNOSIS — M6281 Muscle weakness (generalized): Secondary | ICD-10-CM

## 2020-07-07 DIAGNOSIS — M25671 Stiffness of right ankle, not elsewhere classified: Secondary | ICD-10-CM

## 2020-07-07 NOTE — Therapy (Signed)
[facility] [address] [place], [place], [address] Phone: [phone]   Fax:  [phone]  Physical Therapy Treatment  Patient Details  Name: Pamela Esparza MRN: [id number] Date of Birth: 1966/05/16 Referring Provider (PT): Newt Minion MD   Encounter Date: 07/07/2020   PT End of Session - 07/07/20 1014    Visit Number 3    Number of Visits 16    PT Start Time 9924    PT Stop Time 1100    PT Time Calculation (min) 45 min    Activity Tolerance Patient tolerated treatment well;No increased pain    Behavior During Therapy WFL for tasks assessed/performed           Past Medical History:  Diagnosis Date  . Anxiety   . Arthritis   . Asthma   . Cataract   . DDD (degenerative disc disease), lumbar   . Depression   . Fibromyalgia   . Hyperlipidemia   . Joint pain   . Neuropathy   . Sjogren's disease ([place])   . Thyroid disease     Past Surgical History:  Procedure Laterality Date  . ABDOMINAL HYSTERECTOMY    . APPENDECTOMY    . CHOLECYSTECTOMY    . finger sugery    . NECK SURGERY    . TENDON TRANSFER Right 11/06/2019   Procedure: PERONEAL RECONSTRUCTION WITH PERONEAL LONGUS RIGHT LEG;  Surgeon: Newt Minion, MD;  Location: [facility];  Service: Orthopedics;  Laterality: Right;    There were no vitals filed for this visit.   Subjective Assessment - 07/07/20 1014    Subjective She is doing each exercise morning & night.  She uses no device in house except on stairs.    Patient Stated Goals Improve standing and walking endurance and comfort.    Currently in Pain? Yes    Pain Score 2     Pain Location Ankle    Pain Orientation Right    Pain Descriptors / Indicators Aching;Sore    Pain Type Chronic pain;Surgical pain    Pain Onset More than a month ago    Pain Frequency Intermittent    Aggravating Factors  walking    Pain Relieving Factors sitting with elevation                              [facility] Adult PT Treatment/Exercise - 07/07/20 1015      Ambulation/Gait   Ambulation/Gait Yes      Self-Care   Self-Care --    Heat/Ice Application --      Neuro Re-ed    Neuro Re-ed Details  Tandem balance 60 seconds RLE in front & 60 secons RLE in back with intermittent touch       Exercises   Exercises Knee/Hip;Ankle      Knee/Hip Exercises: Standing   Hip Flexion Left;Stengthening;1 set;15 reps;Knee straight;Other (comment)   working on closed chain RLE strengthening   Hip Flexion Limitations red theraband    Hip ADduction Strengthening;Left;1 set;10 reps   working on closed chain RLE strengthening   Hip ADduction Limitations red theraband    Hip Abduction Stengthening;Left;1 set;15 reps;Knee straight   working on closed chain RLE strengthening   Abduction Limitations red theraband    Hip Extension Stengthening;Left;1 set;15 reps;Knee straight   working on closed chain RLE strengthening   Extension Limitations red theraband    Step Down Right;Left;10 reps;1 set;Hand Hold: 2;Step Height:  4"    Step Down Limitations PF for initial contact, controlled lowering heel to ground & stabilizing without UE support.       Modalities   Modalities --      Vasopneumatic   Number Minutes Vasopneumatic  --    Vasopnuematic Location  --    Vasopneumatic Pressure --    Vasopneumatic Temperature  --      Ankle Exercises: Standing   Vector Stance Right;Left   10 reps, light BUE support //bars   Vector Stance Limitations slider forward, lateral & backward.    Rocker Board 1 minute   light BUE support //bars   Rocker Board Limitations ant/post & right/left    Heel Raises Both;15 reps;5 seconds    Heel Raises Limitations BUE support with cues to avoid lifting    Toe Raise 15 reps;3 seconds    Toe Raise Limitations alternating DF & PF with balance issues toe raises.     Other Standing Ankle Exercises standing crossways on foam beam feet apart EO head  movements right/left, up/down & diagonals 10 reps ea.     Other Standing Ankle Exercises standing tandem on foam beam 1 minute RLE forward & RLE backward      Ankle Exercises: Stretches   Gastroc Stretch 3 reps;30 seconds;Limitations   seated with leg extended with strap     Ankle Exercises: Seated   ABC's 1 rep    BAPS Sitting;Level 2;10 reps   seated on 24" stool   BAPS Limitations PF/DF, pronation/supination & circles both ways    Other Seated Ankle Exercises seated with foot on 8" foot stool with red theraband 15 reps ea. :  inversion,  eversion,  PF w/inversion & PF w/eversion.     Other Seated Ankle Exercises sit to/from stand without UE assist from 24" bar stool 10 reps.                  PT Education - 07/07/20 1359    Education Details standing red theraband kicks 4 ways    Person(s) Educated Patient    Methods Explanation;Demonstration;Verbal cues    Comprehension Verbalized understanding;Returned demonstration               PT Long Term Goals - 06/30/20 1654      PT LONG TERM GOAL #1   Title Improve FOTO Functional Score to 50.    Baseline 21    Time 8    Period Weeks    Status New    Target Date 08/22/20      PT LONG TERM GOAL #2   Title Improve R ankle pain to 0-2/10 and B knee pain to consistently 0-3/10 on the Numeric Pain Rating Scale.    Baseline 5/10    Time 8    Period Weeks    Status New    Target Date 08/22/20      PT LONG TERM GOAL #3   Title Improve R ankle and B quadriceps strength to 5/5 MMT.    Baseline 4/5 to 4+/5 MMT    Time 8    Period Weeks    Status New    Target Date 08/22/20      PT LONG TERM GOAL #4   Title Improve B knee flexion AROM to 125 degrees and B ankle dorsiflexion to 10 degrees.    Baseline See objective.    Time 8    Period Weeks    Status New    Target Date 08/22/20  PT LONG TERM GOAL #5   Title [place] will be independent with her long-term HEP at [place].    Time 8    Period Weeks    Status New     Target Date 08/22/20                 Plan - 07/07/20 1014    Clinical Impression Statement PT progressed exercises to more standing for weight bearing / closed chain. She improved with instruction & repetition but needs further strengthening.  She is going to visit her parents in [place] and appears to understand HEP to work some while out of PT.    Personal Factors and Comorbidities Comorbidity 1    Comorbidities Fibromyalgia    Examination-Activity Limitations Carry;Stand;Stairs;Locomotion Level;Squat;Lift    Examination-Participation Restrictions Community Activity    Stability/Clinical Decision Making Stable/Uncomplicated    Rehab Potential Good    PT Frequency 2x / week    PT Duration 8 weeks    PT Treatment/Interventions ADLs/Self Care Home Management;Cryotherapy;Therapeutic activities;Stair training;Gait training;Therapeutic exercise;Balance training;Neuromuscular re-education;Patient/family education;Vasopneumatic Device    PT Next Visit Plan Progress ankle and quadriceps strengthening.    PT Home Exercise Plan Access Code: [place] and Agree with Plan of Care Patient           Patient will benefit from skilled therapeutic intervention in order to improve the following deficits and impairments:  Abnormal gait, Decreased activity tolerance, Decreased balance, Decreased endurance, Decreased range of motion, Decreased strength, Difficulty walking, Impaired flexibility, Pain, Increased edema  Visit Diagnosis: Difficulty walking  Chronic pain of left knee  Chronic pain of right knee  Pain in right ankle and joints of right foot  Muscle weakness (generalized)  Stiffness of left knee, not elsewhere classified  Localized edema  Stiffness of right ankle, not elsewhere classified     Problem List Patient Active Problem List   Diagnosis Date Noted  . Peroneal tendinitis, right leg   . Constipation 08/14/2019  . Multiple atypical nevi 08/14/2019  .  Haglund's deformity of right heel 06/25/2019  . Concussion 06/21/2019  . Acute right ankle pain 03/21/2019  . Hypothyroidism 03/21/2019  . Sore throat 01/23/2019  . Acute otitis media 01/23/2019  . Cough in adult 12/20/2018  . Healthcare maintenance 10/23/2018  . Chronic joint pain 10/23/2018  . Breast cancer screening 10/23/2018  . DDD (degenerative disc disease), lumbar 10/23/2018  . Muscle pain, fibromyalgia 10/23/2018    Jamey Reas, PT, DPT 07/07/2020, 2:03 PM  [facility] [address] [place], [place], [address] Phone: [phone]   Fax:  [phone]  Name: Pamela Esparza MRN: [id number] Date of Birth: Feb 15, 1966

## 2020-07-10 ENCOUNTER — Telehealth: Payer: Self-pay | Admitting: Physician Assistant

## 2020-07-10 DIAGNOSIS — E7800 Pure hypercholesterolemia, unspecified: Secondary | ICD-10-CM

## 2020-07-10 DIAGNOSIS — E039 Hypothyroidism, unspecified: Secondary | ICD-10-CM

## 2020-07-10 MED ORDER — ATORVASTATIN CALCIUM 10 MG PO TABS: 10.0000 mg | ORAL_TABLET | Freq: Every day | ORAL | 0 refills | Status: DC

## 2020-07-10 MED ORDER — LEVOTHYROXINE SODIUM 50 MCG PO TABS: 50.0000 ug | ORAL_TABLET | Freq: Every day | ORAL | 0 refills | Status: DC

## 2020-07-10 NOTE — Telephone Encounter (Signed)
90 day supply of med sent to pharmacy. AS, CMA

## 2020-07-10 NOTE — Addendum Note (Signed)
Addended by: Mickel Crow on: 07/10/2020 11:17 AM   Modules accepted: Orders

## 2020-07-10 NOTE — Telephone Encounter (Signed)
Patient is requesting a refill of her atorvastatin and thyroid med. She is also requesting an additional refill on the order if possible. If approved please send to her [facility] pharm

## 2020-07-16 ENCOUNTER — Encounter: Payer: Self-pay | Admitting: Physician Assistant

## 2020-07-16 ENCOUNTER — Ambulatory Visit (INDEPENDENT_AMBULATORY_CARE_PROVIDER_SITE_OTHER): Payer: Federal, State, Local not specified - PPO | Admitting: Physician Assistant

## 2020-07-16 ENCOUNTER — Other Ambulatory Visit: Payer: Self-pay

## 2020-07-16 DIAGNOSIS — J069 Acute upper respiratory infection, unspecified: Secondary | ICD-10-CM

## 2020-07-16 MED ORDER — AZITHROMYCIN 250 MG PO TABS: ORAL_TABLET | ORAL | 0 refills | Status: DC

## 2020-07-16 MED ORDER — PREDNISONE 20 MG PO TABS: ORAL_TABLET | ORAL | 0 refills | Status: DC

## 2020-07-16 NOTE — Progress Notes (Signed)
Telehealth office visit note for Pamela Reid, PA-C- at [facility]   I connected with current patient today by telephone and verified that I am speaking with the correct person   . Location of the patient: Home . Location of the provider: Office - This visit type was conducted due to national recommendations for restrictions regarding the COVID-19 Pandemic (e.g. social distancing) in an effort to limit this patient's exposure and mitigate transmission in our community.    - No physical exam could be performed with this format, beyond that communicated to [place] by the patient/ family members as noted.   - Additionally my office staff/ schedulers were to discuss with the patient that there may be a monetary charge related to this service, depending on their medical insurance.  My understanding is that patient understood and consented to proceed.     _________________________________________________________________________________   History of Present Illness: Patient calls in today with complaints of feels fluid in her ears, right tonsil is red and mild dysphagia, a fever on and off, scratchy throat, mild coryza, congestion and feels like her lymph nodes are swollen.  Reports symptoms started about 1 month ago and have gradually worsened.  Has been taking Claritin-D and Sudafed with minimal relief. Denies dyspnea.     No flowsheet data found.  Depression screen [facility] 2/9 07/16/2020 08/14/2019 06/21/2019 03/21/2019 01/23/2019  Decreased Interest 0 2 0 2 0  Down, Depressed, Hopeless 1 0 1 0 0  PHQ - 2 Score 1 2 1 2  0  Altered sleeping 2 3 2 3 1   Tired, decreased energy 3 3 3 3 2   Change in appetite 1 1 1  0 1  Feeling bad or failure about yourself  0 0 0 0 0  Trouble concentrating 3 2 1 1 1   Moving slowly or fidgety/restless 0 1 0 0 1  Suicidal thoughts 0 0 0 0 0  PHQ-9 Score 10 12 8 9 6   Difficult doing work/chores Somewhat difficult Very difficult Somewhat difficult  Very difficult Somewhat difficult      Impression and Recommendations:     1. Upper respiratory tract infection, unspecified type     Upper respiratory tract infection, unspecified type: -Symptoms have been ongoing for more than 10 days and progressed so will start antibiotic and prednisone taper.  Patient reports getting yeast infection with Augmentin and prefers Z-Pak. -Recommend to continue with home supportive therapy, warm liquids/honey, humidifier and rest.    - As part of my medical decision making, I reviewed the following data within the [place] History obtained from pt /family, CMA notes reviewed and incorporated if applicable, Labs reviewed, Radiograph/ tests reviewed if applicable and OV notes from prior OV's with me, as well as any other specialists she/he has seen since seeing me last, were all reviewed and used in my medical decision making process today.    - Additionally, when appropriate, discussion had with patient regarding our treatment plan, and their biases/concerns about that plan were used in my medical decision making today.    - The patient agreed with the plan and demonstrated an understanding of the instructions.   No barriers to understanding were identified.     - The patient was advised to call back or seek an in-person evaluation if the symptoms worsen or if the condition fails to improve as anticipated.   Return if symptoms worsen or fail to improve.    No orders  of the defined types were placed in this encounter.   Meds ordered this encounter  Medications  . azithromycin (ZITHROMAX) 250 MG tablet    Sig: Take 2 tablets by mouth once daily on day 1. Then take 1 tablet by mouth once daily for 4 days.    Dispense:  6 tablet    Refill:  0    Order Specific Question:   Supervising Provider    Answer:   Beatrice Lecher D [[id number]]  . predniSONE (DELTASONE) 20 MG tablet    Sig: Take 3 tablets PO x 2 days, then 2 tablets x 2  days, then 1 tablet x 2 days, then 0.5 tablet x 2 days    Dispense:  15 tablet    Refill:  0    Order Specific Question:   Supervising Provider    Answer:   Beatrice Lecher D [[id number]]    Medications Discontinued During This Encounter  Medication Reason  . predniSONE (DELTASONE) 50 MG tablet Completed Course       Time spent on visit including pre-visit chart review and post-visit care was 7 minutes.      The [place] was signed into law in 2016 which includes the topic of electronic health records.  This provides immediate access to information in MyChart.  This includes consultation notes, operative notes, office notes, lab results and pathology reports.  If you have any questions about what you read please let us know at your next visit or call us at the office.  We are right here with you.  Note:  This note was prepared with assistance of Dragon voice recognition software. Occasional wrong-word or sound-a-like substitutions may have occurred due to the inherent limitations of voice recognition software.  __________________________________________________________________________________     Patient Care Team    Relationship Specialty Notifications Start End  Pamela Esparza, [place] PCP - General Physician Assistant  07/10/20      -Vitals obtained; medications/ allergies reconciled;  personal medical, social, Sx etc.histories were updated by CMA, reviewed by me and are reflected in chart   Patient Active Problem List   Diagnosis Date Noted  . Peroneal tendinitis, right leg   . Constipation 08/14/2019  . Multiple atypical nevi 08/14/2019  . Haglund's deformity of right heel 06/25/2019  . Concussion 06/21/2019  . Acute right ankle pain 03/21/2019  . Hypothyroidism 03/21/2019  . Sore throat 01/23/2019  . Acute otitis media 01/23/2019  . Cough in adult 12/20/2018  . Healthcare maintenance 10/23/2018  . Chronic joint pain 10/23/2018  . Breast cancer  screening 10/23/2018  . DDD (degenerative disc disease), lumbar 10/23/2018  . Muscle pain, fibromyalgia 10/23/2018     Current Meds  Medication Sig  . albuterol (VENTOLIN HFA) 108 (90 Base) MCG/ACT inhaler INHALE 1 TO 2 PUFFS INTO THE LUNGS EVERY 6 HOURS AS NEEDED  . ALPRAZolam (XANAX) 0.5 MG tablet Take 0.5 mg by mouth daily as needed.  Marland Kitchen atorvastatin (LIPITOR) 10 MG tablet Take 1 tablet (10 mg total) by mouth daily.  . baclofen (LIORESAL) 10 MG tablet Take 10 mg by mouth 3 (three) times daily as needed.  . Cholecalciferol (VITAMIN D3) 75 MCG (3000 UT) TABS Take 2,000 Int'l Units by mouth daily.  . DULoxetine (CYMBALTA) 60 MG capsule Take 120 mg by mouth daily.  . fluticasone (FLONASE) 50 MCG/ACT nasal spray SHAKE LIQUID AND USE 1 SPRAY IN EACH NOSTRIL DAILY  . gabapentin (NEURONTIN) 600 MG tablet Take 1 tablet (600 mg total) by  mouth 3 (three) times daily.  Marland Kitchen levothyroxine (SYNTHROID) 50 MCG tablet Take 1 tablet (50 mcg total) by mouth daily before breakfast.  . meloxicam (MOBIC) 15 MG tablet Take 15 mg by mouth.  . Multiple Vitamins-Minerals (CENTRUM SILVER PO) Take by mouth.  . nitroGLYCERIN (NITROSTAT) 0.4 MG SL tablet Place under the tongue.  Marland Kitchen oxyCODONE-acetaminophen (PERCOCET) 10-325 MG tablet Take 1 tablet by mouth 2 (two) times daily.  . pilocarpine (SALAGEN) 5 MG tablet Take 5 mg by mouth 3 (three) times daily.     Allergies:  Allergies  Allergen Reactions  . Aspirin Other (See Comments)    stomach upset stomach upset   . Hydrocodone-Acetaminophen Other (See Comments)    made her sick made her sick   . Lyrica [Pregabalin]   . Topiramate      ROS:  See above HPI for pertinent positives and negatives   Objective:   There were no vitals taken for this visit.  (if some vitals are omitted, this means that patient was UNABLE to obtain them even though they were asked to get them prior to OV today.  They were asked to call us at their earliest convenience with these  once obtained. ) General: A & O * 3; sounds in no acute distress and congested Respiratory: speaking in full sentences, no conversational dyspnea Psych: insight appears good, mood- appears full

## 2020-07-29 ENCOUNTER — Encounter: Payer: Self-pay | Admitting: Orthopedic Surgery

## 2020-07-29 ENCOUNTER — Encounter: Payer: Self-pay | Admitting: Physical Therapy

## 2020-07-29 ENCOUNTER — Other Ambulatory Visit: Payer: Self-pay

## 2020-07-29 ENCOUNTER — Ambulatory Visit (INDEPENDENT_AMBULATORY_CARE_PROVIDER_SITE_OTHER): Payer: Federal, State, Local not specified - PPO | Admitting: Physical Therapy

## 2020-07-29 ENCOUNTER — Ambulatory Visit (INDEPENDENT_AMBULATORY_CARE_PROVIDER_SITE_OTHER): Payer: Federal, State, Local not specified - PPO | Admitting: Orthopedic Surgery

## 2020-07-29 VITALS — Ht 65.0 in | Wt 260.0 lb

## 2020-07-29 DIAGNOSIS — M25562 Pain in left knee: Secondary | ICD-10-CM | POA: Diagnosis not present

## 2020-07-29 DIAGNOSIS — M7671 Peroneal tendinitis, right leg: Secondary | ICD-10-CM

## 2020-07-29 DIAGNOSIS — R262 Difficulty in walking, not elsewhere classified: Secondary | ICD-10-CM | POA: Diagnosis not present

## 2020-07-29 DIAGNOSIS — M6281 Muscle weakness (generalized): Secondary | ICD-10-CM | POA: Diagnosis not present

## 2020-07-29 DIAGNOSIS — M25561 Pain in right knee: Secondary | ICD-10-CM

## 2020-07-29 DIAGNOSIS — R600 Localized edema: Secondary | ICD-10-CM | POA: Diagnosis not present

## 2020-07-29 DIAGNOSIS — G8929 Other chronic pain: Secondary | ICD-10-CM | POA: Diagnosis not present

## 2020-07-29 DIAGNOSIS — M25671 Stiffness of right ankle, not elsewhere classified: Secondary | ICD-10-CM | POA: Diagnosis not present

## 2020-07-29 DIAGNOSIS — M25661 Stiffness of right knee, not elsewhere classified: Secondary | ICD-10-CM

## 2020-07-29 DIAGNOSIS — M25662 Stiffness of left knee, not elsewhere classified: Secondary | ICD-10-CM | POA: Diagnosis not present

## 2020-07-29 DIAGNOSIS — M25571 Pain in right ankle and joints of right foot: Secondary | ICD-10-CM

## 2020-07-29 NOTE — Progress Notes (Signed)
Office Visit Note   Patient: Pamela Esparza           Date of Birth: 1966-08-21           MRN: [id number] Visit Date: 07/29/2020              Requested by: No referring provider defined for this encounter. PCP: Lorrene Reid, PA-C  Chief Complaint  Patient presents with  . Right Ankle - Follow-up    11/06/19 right peroneal tendon reconstruction       HPI: Patient is a 54 year old woman who presents in follow-up status post perineal reconstruction on the right.  Patient states she is doing physical therapy 2 times a week she is ambulating with a cane she states she feels well and has no pain.  Patient states that she drove to [place] to play bingo with her mother while driving home she had massive swelling of her legs.  Assessment & Plan: Visit Diagnoses:  1. Peroneal tendinitis, right leg     Plan: Patient does not have signs or symptoms of a DVT recommended a knee-high compression socks size extra-large.  Follow-Up Instructions: Return if symptoms worsen or fail to improve.   Ortho Exam  Patient is alert, oriented, no adenopathy, well-dressed, normal affect, normal respiratory effort. Examination patient has good function of her peroneal tendons good eversion and inversion strength.  Patient has a negative Bevelyn Buckles' sign no pain with the dorsiflexion of her ankle the calf is soft nontender there is pitting edema there is no redness no cellulitis no signs of infection or DVT.  Imaging: No results found. No images are attached to the encounter.  Labs: Lab Results  Component Value Date   HGBA1C 5.4 03/21/2019     Lab Results  Component Value Date   ALBUMIN 4.5 09/26/2019   ALBUMIN 4.4 03/21/2019    No results found for: MG Lab Results  Component Value Date   VD25OH 38.3 08/14/2019    No results found for: PREALBUMIN CBC EXTENDED Latest Ref Rng & Units 03/21/2019  WBC 3.4 - 10.8 x10E3/uL 7.0  RBC 3.77 - 5.28 x10E6/uL 4.61  HGB 11.1 - 15.9 g/dL 14.0  HCT 34.0  - 46.6 % 41.6  PLT 150 - 450 x10E3/uL 308  NEUTROABS 1.40 - 7.00 x10E3/uL 3.8  LYMPHSABS 0 - 3 x10E3/uL 2.5     Body mass index is 43.27 kg/m.  Orders:  No orders of the defined types were placed in this encounter.  No orders of the defined types were placed in this encounter.    Procedures: No procedures performed  Clinical Data: No additional findings.  ROS:  All other systems negative, except as noted in the HPI. Review of Systems  Objective: Vital Signs: Ht 5\' 5"  (1.651 m)   Wt 260 lb (117.9 kg)   BMI 43.27 kg/m   Specialty Comments:  No specialty comments available.  PMFS History: Patient Active Problem List   Diagnosis Date Noted  . Peroneal tendinitis, right leg   . Constipation 08/14/2019  . Multiple atypical nevi 08/14/2019  . Haglund's deformity of right heel 06/25/2019  . Concussion 06/21/2019  . Acute right ankle pain 03/21/2019  . Hypothyroidism 03/21/2019  . Sore throat 01/23/2019  . Acute otitis media 01/23/2019  . Cough in adult 12/20/2018  . Healthcare maintenance 10/23/2018  . Chronic joint pain 10/23/2018  . Breast cancer screening 10/23/2018  . DDD (degenerative disc disease), lumbar 10/23/2018  . Muscle pain, fibromyalgia 10/23/2018   Past  Medical History:  Diagnosis Date  . Anxiety   . Arthritis   . Asthma   . Cataract   . DDD (degenerative disc disease), lumbar   . Depression   . Fibromyalgia   . Hyperlipidemia   . Joint pain   . Neuropathy   . Sjogren's disease ([place])   . Thyroid disease     Family History  Problem Relation Age of Onset  . Cancer Mother   . Heart attack Father   . Hypertension Father   . Diabetes Father   . Birth defects Maternal Grandmother        ovarian  . Depression Maternal Grandmother   . Birth defects Paternal Grandmother        brain  . Colon cancer Neg Hx   . Colon polyps Neg Hx   . Esophageal cancer Neg Hx   . Stomach cancer Neg Hx   . Rectal cancer Neg Hx     Past Surgical  History:  Procedure Laterality Date  . ABDOMINAL HYSTERECTOMY    . APPENDECTOMY    . CHOLECYSTECTOMY    . finger sugery    . NECK SURGERY    . TENDON TRANSFER Right 11/06/2019   Procedure: PERONEAL RECONSTRUCTION WITH PERONEAL LONGUS RIGHT LEG;  Surgeon: Newt Minion, MD;  Location: [place];  Service: Orthopedics;  Laterality: Right;   Social History   Occupational History  . Not on file  Tobacco Use  . Smoking status: Never Smoker  . Smokeless tobacco: Never Used  Vaping Use  . Vaping Use: Never used  Substance and Sexual Activity  . Alcohol use: Yes    Comment: 4 drinks a year-rare  . Drug use: Never  . Sexual activity: Not Currently

## 2020-07-29 NOTE — Therapy (Signed)
[facility] [address] [place], [place], [address] Phone: [phone]   Fax:  [phone]  Physical Therapy Treatment  Patient Details  Name: Pamela Esparza MRN: [id number] Date of Birth: January 04, 1966 Referring Provider (PT): Newt Minion MD   Encounter Date: 07/29/2020   PT End of Session - 07/29/20 1340    Visit Number 4    Number of Visits 16    PT Start Time 1761    PT Stop Time 1430    PT Time Calculation (min) 48 min    Activity Tolerance Patient tolerated treatment well;No increased pain    Behavior During Therapy WFL for tasks assessed/performed           Past Medical History:  Diagnosis Date  . Anxiety   . Arthritis   . Asthma   . Cataract   . DDD (degenerative disc disease), lumbar   . Depression   . Fibromyalgia   . Hyperlipidemia   . Joint pain   . Neuropathy   . Sjogren's disease ([place])   . Thyroid disease     Past Surgical History:  Procedure Laterality Date  . ABDOMINAL HYSTERECTOMY    . APPENDECTOMY    . CHOLECYSTECTOMY    . finger sugery    . NECK SURGERY    . TENDON TRANSFER Right 11/06/2019   Procedure: PERONEAL RECONSTRUCTION WITH PERONEAL LONGUS RIGHT LEG;  Surgeon: Newt Minion, MD;  Location: [place];  Service: Orthopedics;  Laterality: Right;    There were no vitals filed for this visit.   Subjective Assessment - 07/29/20 1343    Subjective She went to [place] for family matter & was not able to work on exercises much.    Patient Stated Goals Improve standing and walking endurance and comfort.    Currently in Pain? Yes    Pain Score 8     Pain Location Knee    Pain Orientation Right    Pain Descriptors / Indicators Throbbing    Pain Type Chronic pain    Pain Onset More than a month ago    Pain Frequency Constant    Aggravating Factors  weather    Pain Relieving Factors meds & elevation                             [facility] Adult PT Treatment/Exercise -  07/29/20 1341      Ambulation/Gait   Ambulation/Gait Yes      Self-Care   Self-Care Heat/Ice Application;Other Self-Care Comments    Heat/Ice Application Use of ice massage with cup.  PT demo & verbal cues initially to right knee and pt return demo understanding. 6 min total.     Other Self-Care Comments  PT demo & instructed in proper elevation with heart one of lowest points. Pt verbalized understanding.        Neuro Re-ed    Neuro Re-ed Details  Tandem balance on towel roll for compliant surface that she can do at home:  60 seconds RLE in front & 60 secons RLE in back with intermittent touch       Exercises   Exercises Knee/Hip;Ankle      Knee/Hip Exercises: Standing   Hip Flexion --    Hip Flexion Limitations --    Hip ADduction --    Hip ADduction Limitations --    Hip Abduction --    Abduction Limitations --    Hip Extension --  Extension Limitations --    Step Down --    Step Down Limitations --      Ankle Exercises: Standing   Vector Stance Right;Left   10 reps, light BUE support //bars   Vector Stance Limitations slider forward, lateral & backward.    Rocker Board 1 minute   light BUE support //bars   Rocker Board Limitations ant/post & right/left    Heel Raises Both;15 reps;5 seconds    Heel Raises Limitations BUE support with cues to avoid lifting    Toe Raise 15 reps;3 seconds    Toe Raise Limitations alternating DF & PF with balance issues toe raises.     Other Standing Ankle Exercises standing crossways on foam beam feet apart EO head movements right/left, up/down & diagonals 10 reps ea.     Other Standing Ankle Exercises standing on floor with feet together eyes closed head movements right/left, up/down & diagonals      Ankle Exercises: Stretches   Gastroc Stretch 3 reps;30 seconds;Limitations   seated with leg extended with strap     Ankle Exercises: Seated   ABC's --    BAPS --    BAPS Limitations --    Other Seated Ankle Exercises --    Other Seated  Ankle Exercises --                  PT Education - 07/29/20 1605    Education Details PT demo advantages of recumbent machine that uses both arms & legs to operate as low intensity cardio    Person(s) Educated Patient    Methods Explanation;Demonstration;Verbal cues    Comprehension Verbalized understanding;Verbal cues required               PT Long Term Goals - 06/30/20 1654      PT LONG TERM GOAL #1   Title Improve FOTO Functional Score to 50.    Baseline 21    Time 8    Period Weeks    Status New    Target Date 08/22/20      PT LONG TERM GOAL #2   Title Improve R ankle pain to 0-2/10 and B knee pain to consistently 0-3/10 on the Numeric Pain Rating Scale.    Baseline 5/10    Time 8    Period Weeks    Status New    Target Date 08/22/20      PT LONG TERM GOAL #3   Title Improve R ankle and B quadriceps strength to 5/5 MMT.    Baseline 4/5 to 4+/5 MMT    Time 8    Period Weeks    Status New    Target Date 08/22/20      PT LONG TERM GOAL #4   Title Improve B knee flexion AROM to 125 degrees and B ankle dorsiflexion to 10 degrees.    Baseline See objective.    Time 8    Period Weeks    Status New    Target Date 08/22/20      PT LONG TERM GOAL #5   Title Tene will be independent with her long-term HEP at [place].    Time 8    Period Weeks    Status New    Target Date 08/22/20                 Plan - 07/29/20 1341    Clinical Impression Statement PT instructed in proper elevation and ice massage and she appears to understand.  PT session worked on balance & standing ankle exercises.    Personal Factors and Comorbidities Comorbidity 1    Comorbidities Fibromyalgia    Examination-Activity Limitations Carry;Stand;Stairs;Locomotion Level;Squat;Lift    Examination-Participation Restrictions Community Activity    Stability/Clinical Decision Making Stable/Uncomplicated    Rehab Potential Good    PT Frequency 2x / week    PT Duration 8 weeks    PT  Treatment/Interventions ADLs/Self Care Home Management;Cryotherapy;Therapeutic activities;Stair training;Gait training;Therapeutic exercise;Balance training;Neuromuscular re-education;Patient/family education;Vasopneumatic Device    PT Next Visit Plan Progress ankle and quadriceps strengthening.    PT Home Exercise Plan Access Code: [place] and Agree with Plan of Care Patient           Patient will benefit from skilled therapeutic intervention in order to improve the following deficits and impairments:  Abnormal gait, Decreased activity tolerance, Decreased balance, Decreased endurance, Decreased range of motion, Decreased strength, Difficulty walking, Impaired flexibility, Pain, Increased edema  Visit Diagnosis: Difficulty walking  Chronic pain of left knee  Chronic pain of right knee  Pain in right ankle and joints of right foot  Muscle weakness (generalized)  Stiffness of left knee, not elsewhere classified  Localized edema  Stiffness of right ankle, not elsewhere classified  Stiffness of right knee, not elsewhere classified     Problem List Patient Active Problem List   Diagnosis Date Noted  . Peroneal tendinitis, right leg   . Constipation 08/14/2019  . Multiple atypical nevi 08/14/2019  . Haglund's deformity of right heel 06/25/2019  . Concussion 06/21/2019  . Acute right ankle pain 03/21/2019  . Hypothyroidism 03/21/2019  . Sore throat 01/23/2019  . Acute otitis media 01/23/2019  . Cough in adult 12/20/2018  . Healthcare maintenance 10/23/2018  . Chronic joint pain 10/23/2018  . Breast cancer screening 10/23/2018  . DDD (degenerative disc disease), lumbar 10/23/2018  . Muscle pain, fibromyalgia 10/23/2018    Jamey Reas, PT, DPT 07/29/2020, 4:12 PM  [facility] [address] [place], [place], [address] Phone: [phone]   Fax:  [phone]  Name: Pamela Esparza MRN: [id number] Date of Birth:  05/21/1966

## 2020-07-31 ENCOUNTER — Encounter: Payer: Medicare Other | Admitting: Physical Therapy

## 2020-08-05 ENCOUNTER — Ambulatory Visit (INDEPENDENT_AMBULATORY_CARE_PROVIDER_SITE_OTHER): Payer: Federal, State, Local not specified - PPO | Admitting: Physical Therapy

## 2020-08-05 ENCOUNTER — Other Ambulatory Visit: Payer: Self-pay

## 2020-08-05 ENCOUNTER — Encounter: Payer: Self-pay | Admitting: Physical Therapy

## 2020-08-05 DIAGNOSIS — M25561 Pain in right knee: Secondary | ICD-10-CM

## 2020-08-05 DIAGNOSIS — M25571 Pain in right ankle and joints of right foot: Secondary | ICD-10-CM

## 2020-08-05 DIAGNOSIS — M6281 Muscle weakness (generalized): Secondary | ICD-10-CM | POA: Diagnosis not present

## 2020-08-05 DIAGNOSIS — M25562 Pain in left knee: Secondary | ICD-10-CM

## 2020-08-05 DIAGNOSIS — M25671 Stiffness of right ankle, not elsewhere classified: Secondary | ICD-10-CM | POA: Diagnosis not present

## 2020-08-05 DIAGNOSIS — G8929 Other chronic pain: Secondary | ICD-10-CM

## 2020-08-05 DIAGNOSIS — M25661 Stiffness of right knee, not elsewhere classified: Secondary | ICD-10-CM

## 2020-08-05 DIAGNOSIS — M25662 Stiffness of left knee, not elsewhere classified: Secondary | ICD-10-CM | POA: Diagnosis not present

## 2020-08-05 DIAGNOSIS — R262 Difficulty in walking, not elsewhere classified: Secondary | ICD-10-CM

## 2020-08-05 DIAGNOSIS — R600 Localized edema: Secondary | ICD-10-CM

## 2020-08-05 NOTE — Therapy (Signed)
[facility] Physical Therapy [address] [place], [place], [address] Phone: [phone]   Fax:  [phone]  Physical Therapy Treatment  Patient Details  Name: Pamela Esparza MRN: [id number] Date of Birth: 1966/03/02 Referring Provider (PT): Newt Minion MD   Encounter Date: 08/05/2020   PT End of Session - 08/05/20 0931    Visit Number 5    Number of Visits 16    PT Start Time 0930    PT Stop Time 3329    PT Time Calculation (min) 45 min    Activity Tolerance Patient tolerated treatment well;No increased pain    Behavior During Therapy WFL for tasks assessed/performed           Past Medical History:  Diagnosis Date  . Anxiety   . Arthritis   . Asthma   . Cataract   . DDD (degenerative disc disease), lumbar   . Depression   . Fibromyalgia   . Hyperlipidemia   . Joint pain   . Neuropathy   . Sjogren's disease ([place])   . Thyroid disease     Past Surgical History:  Procedure Laterality Date  . ABDOMINAL HYSTERECTOMY    . APPENDECTOMY    . CHOLECYSTECTOMY    . finger sugery    . NECK SURGERY    . TENDON TRANSFER Right 11/06/2019   Procedure: PERONEAL RECONSTRUCTION WITH PERONEAL LONGUS RIGHT LEG;  Surgeon: Newt Minion, MD;  Location: [place];  Service: Orthopedics;  Laterality: Right;    There were no vitals filed for this visit.   Subjective Assessment - 08/05/20 0930    Subjective She had flare up of fibromyalgia last week. She is still having nerve pain but better.    Patient Stated Goals Improve standing and walking endurance and comfort.    Currently in Pain? Yes    Pain Score 3     Pain Location Other (Comment)   all over   Pain Descriptors / Indicators Numbness;Pins and needles    Pain Onset More than a month ago    Pain Frequency Constant    Aggravating Factors  stress    Pain Relieving Factors gabapentin                             [facility] Adult PT Treatment/Exercise - 08/05/20 0930       Ambulation/Gait   Ambulation/Gait Yes      Self-Care   Self-Care Other Self-Care Comments;ADL's    ADL's using visual imagery to reduce stress & thereby pain. Pt verbalized understanding.     Heat/Ice Application --    Other Self-Care Comments  --      Neuro Re-ed    Neuro Re-ed Details  --      Exercises   Exercises Knee/Hip;Ankle      Knee/Hip Exercises: Standing   Terminal Knee Extension Strengthening;Right;Left;3 sets;10 reps;Theraband   1 hand support   Theraband Level (Terminal Knee Extension) Level 3 (Green)    Terminal Knee Extension Limitations 1st set bilateral stance, 2nd set initial contact with DF, 3rd set terminal stance with heel rise    Forward Step Up Right;Left;10 reps;Hand Hold: 1;Step Height: 6"    Forward Step Up Limitations PT demo & verbal cues on proper form    Step Down Right;Left;1 set;10 reps;Hand Hold: 1;Step Height: 6"    Step Down Limitations PF for initial contact, controlled lowering heel to ground & stabilizing without UE  support.       Ankle Exercises: Standing   BAPS Standing;Level 2;10 reps;Other (comment)   2sets, BUE support light & light support contralat on board   BAPS Limitations circles clockwise & counterclockwise    BAPS Weights (lbs) 0    Vector Stance Right;Left   10 reps, light BUE support //bars   Vector Stance Limitations slider forward, lateral & backward.    Rocker Board --    Diplomatic Services operational officer Limitations --    Heel Raises Both;15 reps;5 seconds    Heel Raises Limitations BUE support with cues to avoid lifting    Toe Raise 15 reps;3 seconds    Toe Raise Limitations alternating DF & PF with balance issues toe raises.     Other Standing Ankle Exercises standing crossways on foam beam feet apart EO head movements right/left, up/down & diagonals 10 reps ea.     Other Standing Ankle Exercises standing on floor with feet together eyes closed head movements right/left, up/down & diagonals      Ankle Exercises: Stretches   Gastroc  Stretch 3 reps;30 seconds;Limitations   seated with leg extended with strap                      PT Long Term Goals - 06/30/20 1654      PT LONG TERM GOAL #1   Title Improve FOTO Functional Score to 50.    Baseline 21    Time 8    Period Weeks    Status New    Target Date 08/22/20      PT LONG TERM GOAL #2   Title Improve R ankle pain to 0-2/10 and B knee pain to consistently 0-3/10 on the Numeric Pain Rating Scale.    Baseline 5/10    Time 8    Period Weeks    Status New    Target Date 08/22/20      PT LONG TERM GOAL #3   Title Improve R ankle and B quadriceps strength to 5/5 MMT.    Baseline 4/5 to 4+/5 MMT    Time 8    Period Weeks    Status New    Target Date 08/22/20      PT LONG TERM GOAL #4   Title Improve B knee flexion AROM to 125 degrees and B ankle dorsiflexion to 10 degrees.    Baseline See objective.    Time 8    Period Weeks    Status New    Target Date 08/22/20      PT LONG TERM GOAL #5   Title Daje will be independent with her long-term HEP at [place].    Time 8    Period Weeks    Status New    Target Date 08/22/20                 Plan - 08/05/20 0932    Clinical Impression Statement Patient is tolerating increased knee & ankle exercises for functional strengthening.  She would benefit from instruction in some basic back exercises especially related to core stabilization which will effect her LE orthopedic issues.    Personal Factors and Comorbidities Comorbidity 1    Comorbidities Fibromyalgia    Examination-Activity Limitations Carry;Stand;Stairs;Locomotion Level;Squat;Lift    Examination-Participation Restrictions Community Activity    Stability/Clinical Decision Making Stable/Uncomplicated    Rehab Potential Good    PT Frequency 2x / week    PT Duration 8 weeks    PT Treatment/Interventions ADLs/Self  Care Home Management;Cryotherapy;Therapeutic activities;Stair training;Gait training;Therapeutic exercise;Balance  training;Neuromuscular re-education;Patient/family education;Vasopneumatic Device    PT Next Visit Plan instruct in basic back exercises especially related to core stabilization which would impact LE orthopedic issues.  Progress ankle and quadriceps strengthening.    PT Home Exercise Plan Access Code: [place] and Agree with Plan of Care Patient           Patient will benefit from skilled therapeutic intervention in order to improve the following deficits and impairments:  Abnormal gait, Decreased activity tolerance, Decreased balance, Decreased endurance, Decreased range of motion, Decreased strength, Difficulty walking, Impaired flexibility, Pain, Increased edema  Visit Diagnosis: Difficulty walking  Chronic pain of left knee  Chronic pain of right knee  Muscle weakness (generalized)  Pain in right ankle and joints of right foot  Stiffness of left knee, not elsewhere classified  Localized edema  Stiffness of right ankle, not elsewhere classified  Stiffness of right knee, not elsewhere classified     Problem List Patient Active Problem List   Diagnosis Date Noted  . Peroneal tendinitis, right leg   . Constipation 08/14/2019  . Multiple atypical nevi 08/14/2019  . Haglund's deformity of right heel 06/25/2019  . Concussion 06/21/2019  . Acute right ankle pain 03/21/2019  . Hypothyroidism 03/21/2019  . Sore throat 01/23/2019  . Acute otitis media 01/23/2019  . Cough in adult 12/20/2018  . Healthcare maintenance 10/23/2018  . Chronic joint pain 10/23/2018  . Breast cancer screening 10/23/2018  . DDD (degenerative disc disease), lumbar 10/23/2018  . Muscle pain, fibromyalgia 10/23/2018    Jamey Reas , PT, DPT 08/05/2020, 10:48 AM  [facility] Physical Therapy [address] [place], [place], [address] Phone: [phone]   Fax:  [phone]  Name: Pamela Esparza MRN: [id number] Date of Birth: 06-12-1966

## 2020-08-06 ENCOUNTER — Ambulatory Visit (INDEPENDENT_AMBULATORY_CARE_PROVIDER_SITE_OTHER): Payer: Federal, State, Local not specified - PPO | Admitting: Rehabilitative and Restorative Service Providers"

## 2020-08-06 ENCOUNTER — Encounter: Payer: Self-pay | Admitting: Rehabilitative and Restorative Service Providers"

## 2020-08-06 DIAGNOSIS — M25671 Stiffness of right ankle, not elsewhere classified: Secondary | ICD-10-CM

## 2020-08-06 DIAGNOSIS — G8929 Other chronic pain: Secondary | ICD-10-CM | POA: Diagnosis not present

## 2020-08-06 DIAGNOSIS — M25661 Stiffness of right knee, not elsewhere classified: Secondary | ICD-10-CM | POA: Diagnosis not present

## 2020-08-06 DIAGNOSIS — M6281 Muscle weakness (generalized): Secondary | ICD-10-CM

## 2020-08-06 DIAGNOSIS — M25561 Pain in right knee: Secondary | ICD-10-CM | POA: Diagnosis not present

## 2020-08-06 DIAGNOSIS — M25662 Stiffness of left knee, not elsewhere classified: Secondary | ICD-10-CM | POA: Diagnosis not present

## 2020-08-06 DIAGNOSIS — M25571 Pain in right ankle and joints of right foot: Secondary | ICD-10-CM

## 2020-08-06 DIAGNOSIS — R262 Difficulty in walking, not elsewhere classified: Secondary | ICD-10-CM

## 2020-08-06 DIAGNOSIS — M25562 Pain in left knee: Secondary | ICD-10-CM

## 2020-08-06 NOTE — Therapy (Signed)
[facility] Physical Therapy [address] [place], [place], [address] Phone: [phone]   Fax:  [phone]  Physical Therapy Treatment  Patient Details  Name: Pamela Esparza MRN: [id number] Date of Birth: 1965/12/28 Referring Provider (PT): Newt Minion MD   Encounter Date: 08/06/2020   PT End of Session - 08/06/20 1626    Visit Number 6    Number of Visits 16    PT Start Time 9381    PT Stop Time 1528    PT Time Calculation (min) 41 min    Activity Tolerance Patient tolerated treatment well;No increased pain    Behavior During Therapy WFL for tasks assessed/performed           Past Medical History:  Diagnosis Date  . Anxiety   . Arthritis   . Asthma   . Cataract   . DDD (degenerative disc disease), lumbar   . Depression   . Fibromyalgia   . Hyperlipidemia   . Joint pain   . Neuropathy   . Sjogren's disease ([place])   . Thyroid disease     Past Surgical History:  Procedure Laterality Date  . ABDOMINAL HYSTERECTOMY    . APPENDECTOMY    . CHOLECYSTECTOMY    . finger sugery    . NECK SURGERY    . TENDON TRANSFER Right 11/06/2019   Procedure: PERONEAL RECONSTRUCTION WITH PERONEAL LONGUS RIGHT LEG;  Surgeon: Newt Minion, MD;  Location: [place];  Service: Orthopedics;  Laterality: Right;    There were no vitals filed for this visit.   Subjective Assessment - 08/06/20 1623    Subjective Sofiya reports significant progress with her ankle pain.  B knee pain has improved and will benefit from continued work.    Patient Stated Goals Improve standing and walking endurance and comfort.    Currently in Pain? Yes    Pain Score 3     Pain Location Knee    Pain Orientation Right;Left    Pain Descriptors / Indicators Aching;Sore;Tightness;Tender    Pain Type Chronic pain    Pain Onset More than a month ago    Pain Frequency Constant    Aggravating Factors  Prolonged postures    Pain Relieving Factors Change of position and  medications    Effect of Pain on Daily Activities Limits standing, walking and sitting endurance                             [facility] Adult PT Treatment/Exercise - 08/06/20 0001      Therapeutic Activites    Therapeutic Activities Other Therapeutic Activities   Step up and over and step down 8 minutes slow eccentrics     Neuro Re-ed    Neuro Re-ed Details  Tandem balance 5X 20 seconds      Exercises   Exercises Knee/Hip;Ankle      Knee/Hip Exercises: Standing   Heel Raises Both;10 reps;3 seconds      Knee/Hip Exercises: Seated   Long Arc Quad Strengthening;Both;3 sets;5 reps   Seated straight leg raises slow eccentrics   Sit to Sand 5 reps;with UE support   hands as needed and slow eccentrics     Ankle Exercises: Stretches   Gastroc Stretch 5 reps;20 seconds    Slant Board Stretch 3 reps;60 seconds                  PT Education - 08/06/20 1625  Education Details Reviewed and updated HEP.  Discussed RA and possible spine activities next visit.    Person(s) Educated Patient    Methods Explanation;Demonstration;Verbal cues;Handout    Comprehension Verbalized understanding;Returned demonstration;Verbal cues required;Need further instruction               PT Long Term Goals - 08/06/20 1626      PT LONG TERM GOAL #1   Title Improve FOTO Functional Score to 50.    Baseline 21    Time 8    Period Weeks    Status On-going      PT LONG TERM GOAL #2   Title Improve R ankle pain to 0-2/10 and B knee pain to consistently 0-3/10 on the Numeric Pain Rating Scale.    Baseline 5/10    Time 8    Period Weeks    Status Partially Met      PT LONG TERM GOAL #3   Title Improve R ankle and B quadriceps strength to 5/5 MMT.    Baseline 4/5 to 4+/5 MMT    Time 8    Period Weeks    Status On-going      PT LONG TERM GOAL #4   Title Improve B knee flexion AROM to 125 degrees and B ankle dorsiflexion to 10 degrees.    Baseline See objective.    Time 8      Period Weeks    Status On-going      PT LONG TERM GOAL #5   Title Tessy will be independent with her long-term HEP at [place].    Time 8    Period Weeks    Status On-going                 Plan - 08/06/20 1627    Clinical Impression Statement [name] reports early progress with her ankle and knee pain.  Knee pain is still functionally limiting and she mentioned wanting to address some low back pain as well.  We discussed reassessment next visit and possible shift to knee and spine PT with more independent ankle work as needed.  RA next visit.    Personal Factors and Comorbidities Comorbidity 1    Comorbidities Fibromyalgia    Examination-Activity Limitations Carry;Stand;Stairs;Locomotion Level;Squat;Lift    Examination-Participation Restrictions Community Activity    Stability/Clinical Decision Making Stable/Uncomplicated    Rehab Potential Good    PT Frequency 2x / week    PT Duration 8 weeks    PT Treatment/Interventions ADLs/Self Care Home Management;Cryotherapy;Therapeutic activities;Stair training;Gait training;Therapeutic exercise;Balance training;Neuromuscular re-education;Patient/family education;Vasopneumatic Device    PT Next Visit Plan instruct in basic back exercises especially related to core stabilization which would impact LE orthopedic issues.  Progress ankle and quadriceps strengthening.    PT Home Exercise Plan Access Code: [place] and Agree with Plan of Care Patient           Patient will benefit from skilled therapeutic intervention in order to improve the following deficits and impairments:  Abnormal gait, Decreased activity tolerance, Decreased balance, Decreased endurance, Decreased range of motion, Decreased strength, Difficulty walking, Impaired flexibility, Pain, Increased edema  Visit Diagnosis: Difficulty walking  Chronic pain of left knee  Chronic pain of right knee  Muscle weakness (generalized)  Pain in right ankle and joints  of right foot  Stiffness of left knee, not elsewhere classified  Stiffness of right knee, not elsewhere classified  Stiffness of right ankle, not elsewhere classified     Problem List  Patient Active Problem List   Diagnosis Date Noted  . Peroneal tendinitis, right leg   . Constipation 08/14/2019  . Multiple atypical nevi 08/14/2019  . Haglund's deformity of right heel 06/25/2019  . Concussion 06/21/2019  . Acute right ankle pain 03/21/2019  . Hypothyroidism 03/21/2019  . Sore throat 01/23/2019  . Acute otitis media 01/23/2019  . Cough in adult 12/20/2018  . Healthcare maintenance 10/23/2018  . Chronic joint pain 10/23/2018  . Breast cancer screening 10/23/2018  . DDD (degenerative disc disease), lumbar 10/23/2018  . Muscle pain, fibromyalgia 10/23/2018    Farley Ly PT, MPT 08/06/2020, 4:29 PM  [facility] Physical Therapy [address] [place], [place], [address] Phone: [phone]   Fax:  [phone]  Name: SHANNIN NAAB MRN: [id number] Date of Birth: 14-Jul-1966

## 2020-08-07 DIAGNOSIS — M4802 Spinal stenosis, cervical region: Secondary | ICD-10-CM | POA: Diagnosis not present

## 2020-08-07 DIAGNOSIS — M47816 Spondylosis without myelopathy or radiculopathy, lumbar region: Secondary | ICD-10-CM | POA: Diagnosis not present

## 2020-08-07 DIAGNOSIS — Z79891 Long term (current) use of opiate analgesic: Secondary | ICD-10-CM | POA: Diagnosis not present

## 2020-08-07 DIAGNOSIS — Z79899 Other long term (current) drug therapy: Secondary | ICD-10-CM | POA: Diagnosis not present

## 2020-08-07 DIAGNOSIS — M47812 Spondylosis without myelopathy or radiculopathy, cervical region: Secondary | ICD-10-CM | POA: Diagnosis not present

## 2020-08-07 DIAGNOSIS — M5459 Other low back pain: Secondary | ICD-10-CM | POA: Diagnosis not present

## 2020-08-07 DIAGNOSIS — G8929 Other chronic pain: Secondary | ICD-10-CM | POA: Diagnosis not present

## 2020-08-07 DIAGNOSIS — M25561 Pain in right knee: Secondary | ICD-10-CM | POA: Diagnosis not present

## 2020-08-07 DIAGNOSIS — M5451 Vertebrogenic low back pain: Secondary | ICD-10-CM | POA: Diagnosis not present

## 2020-08-07 DIAGNOSIS — M170 Bilateral primary osteoarthritis of knee: Secondary | ICD-10-CM | POA: Diagnosis not present

## 2020-08-07 DIAGNOSIS — M7918 Myalgia, other site: Secondary | ICD-10-CM | POA: Diagnosis not present

## 2020-08-07 DIAGNOSIS — M961 Postlaminectomy syndrome, not elsewhere classified: Secondary | ICD-10-CM | POA: Diagnosis not present

## 2020-08-07 DIAGNOSIS — G588 Other specified mononeuropathies: Secondary | ICD-10-CM | POA: Diagnosis not present

## 2020-08-07 DIAGNOSIS — M533 Sacrococcygeal disorders, not elsewhere classified: Secondary | ICD-10-CM | POA: Diagnosis not present

## 2020-08-07 DIAGNOSIS — M542 Cervicalgia: Secondary | ICD-10-CM | POA: Diagnosis not present

## 2020-08-07 DIAGNOSIS — M461 Sacroiliitis, not elsewhere classified: Secondary | ICD-10-CM | POA: Diagnosis not present

## 2020-08-07 DIAGNOSIS — M5030 Other cervical disc degeneration, unspecified cervical region: Secondary | ICD-10-CM | POA: Diagnosis not present

## 2020-08-11 ENCOUNTER — Encounter: Payer: Medicare Other | Admitting: Physical Therapy

## 2020-08-13 ENCOUNTER — Ambulatory Visit (INDEPENDENT_AMBULATORY_CARE_PROVIDER_SITE_OTHER): Payer: Federal, State, Local not specified - PPO | Admitting: Physical Therapy

## 2020-08-13 ENCOUNTER — Encounter: Payer: Self-pay | Admitting: Physical Therapy

## 2020-08-13 ENCOUNTER — Other Ambulatory Visit: Payer: Self-pay

## 2020-08-13 DIAGNOSIS — R262 Difficulty in walking, not elsewhere classified: Secondary | ICD-10-CM | POA: Diagnosis not present

## 2020-08-13 DIAGNOSIS — M25662 Stiffness of left knee, not elsewhere classified: Secondary | ICD-10-CM | POA: Diagnosis not present

## 2020-08-13 DIAGNOSIS — M25561 Pain in right knee: Secondary | ICD-10-CM

## 2020-08-13 DIAGNOSIS — M25562 Pain in left knee: Secondary | ICD-10-CM | POA: Diagnosis not present

## 2020-08-13 DIAGNOSIS — M25571 Pain in right ankle and joints of right foot: Secondary | ICD-10-CM | POA: Diagnosis not present

## 2020-08-13 DIAGNOSIS — G8929 Other chronic pain: Secondary | ICD-10-CM | POA: Diagnosis not present

## 2020-08-13 DIAGNOSIS — M6281 Muscle weakness (generalized): Secondary | ICD-10-CM | POA: Diagnosis not present

## 2020-08-13 NOTE — Therapy (Signed)
[facility] Physical Therapy [address] [place], [place], [address] Phone: [phone]   Fax:  [phone]  Physical Therapy Treatment  Patient Details  Name: Pamela Esparza MRN: [id number] Date of Birth: 09-01-1966 Referring Provider (PT): Newt Minion MD   Encounter Date: 08/13/2020   PT End of Session - 08/13/20 1151    Visit Number 7    Number of Visits 16    PT Start Time 0845    PT Stop Time 0930    PT Time Calculation (min) 45 min    Activity Tolerance Patient tolerated treatment well;No increased pain    Behavior During Therapy WFL for tasks assessed/performed           Past Medical History:  Diagnosis Date  . Anxiety   . Arthritis   . Asthma   . Cataract   . DDD (degenerative disc disease), lumbar   . Depression   . Fibromyalgia   . Hyperlipidemia   . Joint pain   . Neuropathy   . Sjogren's disease ([place])   . Thyroid disease     Past Surgical History:  Procedure Laterality Date  . ABDOMINAL HYSTERECTOMY    . APPENDECTOMY    . CHOLECYSTECTOMY    . finger sugery    . NECK SURGERY    . TENDON TRANSFER Right 11/06/2019   Procedure: PERONEAL RECONSTRUCTION WITH PERONEAL LONGUS RIGHT LEG;  Surgeon: Newt Minion, MD;  Location: [place];  Service: Orthopedics;  Laterality: Right;    There were no vitals filed for this visit.   Subjective Assessment - 08/13/20 0845    Subjective no pain in ankle nor knee this morning.    Patient Stated Goals Improve standing and walking endurance and comfort.    Currently in Pain? Yes    Pain Score 5    in last week, worst 8/10, best 3/10   Pain Location Knee    Pain Orientation Right;Left    Pain Descriptors / Indicators Aching;Tightness    Pain Onset More than a month ago    Pain Frequency Constant    Aggravating Factors  standing & walking, exercising    Pain Relieving Factors rest, elevation, meds                             [facility] Adult PT  Treatment/Exercise - 08/13/20 0843      Therapeutic Activites    Therapeutic Activities --      Neuro Re-ed    Neuro Re-ed Details  Tandem balance on foam beam RLE in front 60 sec & RLE in back 60 sec      Exercises   Exercises Knee/Hip;Ankle      Knee/Hip Exercises: Stretches   Gastroc Stretch Right;Left;2 reps;20 seconds    Gastroc Stretch Limitations standing on step with single LE heel depression      Knee/Hip Exercises: Aerobic   Nustep Seat 8 level 5 with BLEs / BUEs 8 min      Knee/Hip Exercises: Machines for Strengthening   Cybex Knee Extension BLEs 15#10 reps  PT educating on form & wt choice    Cybex Knee Flexion BLEs 25#10 reps  PT educating on form & wt choice    Cybex Leg Press BLEs 75# 15 reps  PT educating on form & wt choice      Knee/Hip Exercises: Standing   Heel Raises Both;10 reps;3 seconds    Heel Raises Limitations heels  off aerobic plate    Forward Step Up Right;Left;10 reps;Hand Hold: 1;Step Height: 6"    Step Down Right;Left;1 set;10 reps;Hand Hold: 1;Step Height: 6"    Rocker Board Other (comment)   10 reps 4 motions BLEs on board   Rocker Board Limitations ant/post, right/left & circles CW / CCW    Other Standing Knee Exercises standing with slider flexion, abduction, extension large range 10 reps ea LE      Knee/Hip Exercises: Seated   Long Arc Quad --    Sit to [employer] without UE support;10 reps      Ankle Exercises: Stretches   Product/process development scientist --                  PT Education - 08/13/20 [phone]    Education Details exercise program home & fitness center / gym  including endurance, flexibility, strength, balance    Person(s) Educated Patient    Methods Explanation;Demonstration               PT Long Term Goals - 08/06/20 1626      PT LONG TERM GOAL #1   Title Improve FOTO Functional Score to 50.    Baseline 21    Time 8    Period Weeks    Status On-going      PT LONG TERM GOAL #2   Title Improve R  ankle pain to 0-2/10 and B knee pain to consistently 0-3/10 on the Numeric Pain Rating Scale.    Baseline 5/10    Time 8    Period Weeks    Status Partially Met      PT LONG TERM GOAL #3   Title Improve R ankle and B quadriceps strength to 5/5 MMT.    Baseline 4/5 to 4+/5 MMT    Time 8    Period Weeks    Status On-going      PT LONG TERM GOAL #4   Title Improve B knee flexion AROM to 125 degrees and B ankle dorsiflexion to 10 degrees.    Baseline See objective.    Time 8    Period Weeks    Status On-going      PT LONG TERM GOAL #5   Title Danica will be independent with her long-term HEP at [place].    Time 8    Period Weeks    Status On-going                 Plan - 08/13/20 1151    Clinical Impression Statement PT began education on benefits including arthritic pain management to well rounded ongoing HEP & fitness plan.  She appears to have a general understanding.    Personal Factors and Comorbidities Comorbidity 1    Comorbidities Fibromyalgia    Examination-Activity Limitations Carry;Stand;Stairs;Locomotion Level;Squat;Lift    Examination-Participation Restrictions Community Activity    Stability/Clinical Decision Making Stable/Uncomplicated    Rehab Potential Good    PT Frequency 2x / week    PT Duration 8 weeks    PT Treatment/Interventions ADLs/Self Care Home Management;Cryotherapy;Therapeutic activities;Stair training;Gait training;Therapeutic exercise;Balance training;Neuromuscular re-education;Patient/family education;Vasopneumatic Device    PT Next Visit Plan instruct in basic back exercises especially related to core stabilization which would impact LE orthopedic issues.  Progress ankle and quadriceps strengthening.    PT Home Exercise Plan Access Code: [place] and Agree with Plan of Care Patient  Patient will benefit from skilled therapeutic intervention in order to improve the following deficits and impairments:  Abnormal gait,  Decreased activity tolerance, Decreased balance, Decreased endurance, Decreased range of motion, Decreased strength, Difficulty walking, Impaired flexibility, Pain, Increased edema  Visit Diagnosis: Difficulty walking  Chronic pain of left knee  Chronic pain of right knee  Muscle weakness (generalized)  Pain in right ankle and joints of right foot  Stiffness of left knee, not elsewhere classified     Problem List Patient Active Problem List   Diagnosis Date Noted  . Peroneal tendinitis, right leg   . Constipation 08/14/2019  . Multiple atypical nevi 08/14/2019  . Haglund's deformity of right heel 06/25/2019  . Concussion 06/21/2019  . Acute right ankle pain 03/21/2019  . Hypothyroidism 03/21/2019  . Sore throat 01/23/2019  . Acute otitis media 01/23/2019  . Cough in adult 12/20/2018  . Healthcare maintenance 10/23/2018  . Chronic joint pain 10/23/2018  . Breast cancer screening 10/23/2018  . DDD (degenerative disc disease), lumbar 10/23/2018  . Muscle pain, fibromyalgia 10/23/2018    Jamey Reas PT, DPT 08/13/2020, 11:53 AM  [facility] Physical Therapy [address] [place], [place], [address] Phone: [phone]   Fax:  [phone]  Name: Pamela Esparza MRN: [id number] Date of Birth: 1965-11-23

## 2020-08-18 ENCOUNTER — Encounter: Payer: Self-pay | Admitting: Physical Therapy

## 2020-08-18 ENCOUNTER — Ambulatory Visit (INDEPENDENT_AMBULATORY_CARE_PROVIDER_SITE_OTHER): Payer: Federal, State, Local not specified - PPO | Admitting: Physical Therapy

## 2020-08-18 ENCOUNTER — Other Ambulatory Visit: Payer: Self-pay

## 2020-08-18 DIAGNOSIS — G8929 Other chronic pain: Secondary | ICD-10-CM

## 2020-08-18 DIAGNOSIS — M25661 Stiffness of right knee, not elsewhere classified: Secondary | ICD-10-CM | POA: Diagnosis not present

## 2020-08-18 DIAGNOSIS — R600 Localized edema: Secondary | ICD-10-CM | POA: Diagnosis not present

## 2020-08-18 DIAGNOSIS — M25562 Pain in left knee: Secondary | ICD-10-CM | POA: Diagnosis not present

## 2020-08-18 DIAGNOSIS — M25671 Stiffness of right ankle, not elsewhere classified: Secondary | ICD-10-CM

## 2020-08-18 DIAGNOSIS — R262 Difficulty in walking, not elsewhere classified: Secondary | ICD-10-CM | POA: Diagnosis not present

## 2020-08-18 DIAGNOSIS — M6281 Muscle weakness (generalized): Secondary | ICD-10-CM

## 2020-08-18 DIAGNOSIS — M25662 Stiffness of left knee, not elsewhere classified: Secondary | ICD-10-CM

## 2020-08-18 DIAGNOSIS — M25571 Pain in right ankle and joints of right foot: Secondary | ICD-10-CM | POA: Diagnosis not present

## 2020-08-18 DIAGNOSIS — M25561 Pain in right knee: Secondary | ICD-10-CM | POA: Diagnosis not present

## 2020-08-18 NOTE — Patient Instructions (Signed)
Access Code: [facility] URL: [email or web address] Date: 08/18/2020 Prepared by: Jamey Reas  Exercises added  Hooklying Single Knee to Chest Stretch - 1 x daily - 7 x weekly - 1 sets - 3 reps - 15 seconds hold Supine Bridge - 1 x daily - 57 x weekly - 1 sets - 10 reps - 5 seconds hold Supine Lower Trunk Rotation - 1 x daily - 7 x weekly - 1 sets - 3 reps - 15 seconds hold  Core stabilization combining pelvic tilt hold with leg slide Supine Posterior Pelvic Tilt - 1 x daily - 7 x weekly - 1 sets - 10 reps - 5 seconds hold Supine Heel Slides - 1 x daily - 7 x weekly - 1 sets - 10 reps - 5 seconds hold

## 2020-08-18 NOTE — Therapy (Signed)
[facility] [address] [place], [place], [address] Phone: [phone]   Fax:  [phone]  Physical Therapy Treatment  Patient Details  Name: Pamela Esparza MRN: [id number] Date of Birth: 1966/07/10 Referring Provider (PT): Pamela Minion MD   Encounter Date: 08/18/2020   PT End of Session - 08/18/20 0851    Visit Number 8    Number of Visits 16    PT Start Time 0845    PT Stop Time 0929    PT Time Calculation (min) 44 min    Activity Tolerance Patient tolerated treatment well;No increased pain    Behavior During Therapy WFL for tasks assessed/performed           Past Medical History:  Diagnosis Date   Anxiety    Arthritis    Asthma    Cataract    DDD (degenerative disc disease), lumbar    Depression    Fibromyalgia    Hyperlipidemia    Joint pain    Neuropathy    Sjogren's disease ([place])    Thyroid disease     Past Surgical History:  Procedure Laterality Date   ABDOMINAL HYSTERECTOMY     APPENDECTOMY     CHOLECYSTECTOMY     finger sugery     NECK SURGERY     TENDON TRANSFER Right 11/06/2019   Procedure: PERONEAL RECONSTRUCTION WITH PERONEAL LONGUS RIGHT LEG;  Surgeon: Pamela Minion, MD;  Location: [place];  Service: Orthopedics;  Laterality: Right;    There were no vitals filed for this visit.   Subjective Assessment - 08/18/20 0845    Subjective She decorated & cleaned her home but paced her house.  She thinks the slider increases her pain.    Patient Stated Goals Improve standing and walking endurance and comfort.    Currently in Pain? Yes    Pain Score 6     Pain Location Other (Comment)   all over with Fibromyalgia   Pain Descriptors / Indicators Aching;Throbbing;Burning    Pain Onset More than a month ago    Aggravating Factors  weather,                             [facility] Adult PT Treatment/Exercise - 08/18/20 0845      Self-Care   ADL's standing with  one foot in lower cabinet to relief back with longer standing times in kitchen & sitting on 24" bar stool as needed to rest back.       Therapeutic Activites    Therapeutic Activities --      Neuro Re-ed    Neuro Re-ed Details  --      Exercises   Exercises Knee/Hip;Ankle      Knee/Hip Exercises: Standing   Heel Raises Both;10 reps;3 seconds    Heel Raises Limitations alt toe raises     Rocker Board 1 minute   ant/post & right/left   Rocker Board Limitations intermittent UE support, cues on engaging hips & knees    Other Standing Knee Exercises tandem stance on foam beam 60 sec RLE in front & RLE in back      Knee/Hip Exercises: Seated   Long Arc Quad --    Sit to [employer] 1 set;10 reps;without UE support   from 24" bar stool     Ankle Exercises: Stretches   Gastroc Stretch 5 reps;20 seconds    Slant Board Stretch 3 reps;60 seconds  PT Education - 08/18/20 0930    Education Details HEP added back exercises    Person(s) Educated Patient    Methods Explanation;Demonstration;Tactile cues;Verbal cues;Handout    Comprehension Verbalized understanding;Returned demonstration;Verbal cues required               PT Long Term Goals - 08/06/20 1626      PT LONG TERM GOAL #1   Title Improve FOTO Functional Score to 50.    Baseline 21    Time 8    Period Weeks    Status On-going      PT LONG TERM GOAL #2   Title Improve R ankle pain to 0-2/10 and B knee pain to consistently 0-3/10 on the Numeric Pain Rating Scale.    Baseline 5/10    Time 8    Period Weeks    Status Partially Met      PT LONG TERM GOAL #3   Title Improve R ankle and B quadriceps strength to 5/5 MMT.    Baseline 4/5 to 4+/5 MMT    Time 8    Period Weeks    Status On-going      PT LONG TERM GOAL #4   Title Improve B knee flexion AROM to 125 degrees and B ankle dorsiflexion to 10 degrees.    Baseline See objective.    Time 8    Period Weeks    Status On-going      PT LONG TERM  GOAL #5   Title Pamela Esparza will be independent with her long-term HEP at [place].    Time 8    Period Weeks    Status On-going                 Plan - 08/18/20 4665    Clinical Impression Statement PT instructed in basic back exercises & self care as part of overall arthritic issues.  She appears to understand.    Personal Factors and Comorbidities Comorbidity 1    Comorbidities Fibromyalgia    Examination-Activity Limitations Carry;Stand;Stairs;Locomotion Level;Squat;Lift    Examination-Participation Restrictions Community Activity    Stability/Clinical Decision Making Stable/Uncomplicated    Rehab Potential Good    PT Frequency 2x / week    PT Duration 8 weeks    PT Treatment/Interventions ADLs/Self Care Home Management;Cryotherapy;Therapeutic activities;Stair training;Gait training;Therapeutic exercise;Balance training;Neuromuscular re-education;Patient/family education;Vasopneumatic Device    PT Next Visit Plan work towards [place].  Progress ankle and quadriceps strengthening.    PT Home Exercise Plan Access Code: [place] and Agree with Plan of Care Patient           Patient will benefit from skilled therapeutic intervention in order to improve the following deficits and impairments:  Abnormal gait, Decreased activity tolerance, Decreased balance, Decreased endurance, Decreased range of motion, Decreased strength, Difficulty walking, Impaired flexibility, Pain, Increased edema  Visit Diagnosis: Difficulty walking  Chronic pain of left knee  Chronic pain of right knee  Muscle weakness (generalized)  Pain in right ankle and joints of right foot  Stiffness of left knee, not elsewhere classified  Stiffness of right knee, not elsewhere classified  Stiffness of right ankle, not elsewhere classified  Localized edema     Problem List Patient Active Problem List   Diagnosis Date Noted   Peroneal tendinitis, right leg    Constipation 08/14/2019   Multiple  atypical nevi 08/14/2019   Haglund's deformity of right heel 06/25/2019   Concussion 06/21/2019   Acute right ankle pain 03/21/2019  Hypothyroidism 03/21/2019   Sore throat 01/23/2019   Acute otitis media 01/23/2019   Cough in adult 12/20/2018   Healthcare maintenance 10/23/2018   Chronic joint pain 10/23/2018   Breast cancer screening 10/23/2018   DDD (degenerative disc disease), lumbar 10/23/2018   Muscle pain, fibromyalgia 10/23/2018    Jamey Reas PT, DPT 08/18/2020, 9:36 AM  [facility] [address] [place], [place], [address] Phone: [phone]   Fax:  [phone]  Name: Pamela Esparza MRN: [id number] Date of Birth: 05-Apr-1966

## 2020-08-19 ENCOUNTER — Encounter: Payer: Medicare Other | Admitting: Physical Therapy

## 2020-08-25 ENCOUNTER — Other Ambulatory Visit: Payer: Self-pay

## 2020-08-25 ENCOUNTER — Encounter: Payer: Self-pay | Admitting: Physical Therapy

## 2020-08-25 ENCOUNTER — Ambulatory Visit (INDEPENDENT_AMBULATORY_CARE_PROVIDER_SITE_OTHER): Payer: Federal, State, Local not specified - PPO | Admitting: Physical Therapy

## 2020-08-25 DIAGNOSIS — M6281 Muscle weakness (generalized): Secondary | ICD-10-CM | POA: Diagnosis not present

## 2020-08-25 DIAGNOSIS — M25671 Stiffness of right ankle, not elsewhere classified: Secondary | ICD-10-CM | POA: Diagnosis not present

## 2020-08-25 DIAGNOSIS — M25661 Stiffness of right knee, not elsewhere classified: Secondary | ICD-10-CM | POA: Diagnosis not present

## 2020-08-25 DIAGNOSIS — R600 Localized edema: Secondary | ICD-10-CM

## 2020-08-25 DIAGNOSIS — G8929 Other chronic pain: Secondary | ICD-10-CM | POA: Diagnosis not present

## 2020-08-25 DIAGNOSIS — R262 Difficulty in walking, not elsewhere classified: Secondary | ICD-10-CM

## 2020-08-25 DIAGNOSIS — M25562 Pain in left knee: Secondary | ICD-10-CM | POA: Diagnosis not present

## 2020-08-25 DIAGNOSIS — M25561 Pain in right knee: Secondary | ICD-10-CM | POA: Diagnosis not present

## 2020-08-25 DIAGNOSIS — M25662 Stiffness of left knee, not elsewhere classified: Secondary | ICD-10-CM

## 2020-08-25 DIAGNOSIS — M25571 Pain in right ankle and joints of right foot: Secondary | ICD-10-CM

## 2020-08-25 NOTE — Therapy (Signed)
[facility] [address] [place], [place], [address] Phone: [phone]   Fax:  [phone]  Physical Therapy Treatment  Patient Details  Name: Pamela Esparza MRN: [id number] Date of Birth: August 17, 1966 Referring Provider (PT): Newt Minion MD   Encounter Date: 08/25/2020   PT End of Session - 08/25/20 0933    Visit Number 9    Number of Visits 16    PT Start Time 0930    PT Stop Time 7793    PT Time Calculation (min) 45 min    Activity Tolerance Patient tolerated treatment well;No increased pain    Behavior During Therapy WFL for tasks assessed/performed           Past Medical History:  Diagnosis Date  . Anxiety   . Arthritis   . Asthma   . Cataract   . DDD (degenerative disc disease), lumbar   . Depression   . Fibromyalgia   . Hyperlipidemia   . Joint pain   . Neuropathy   . Sjogren's disease ([place])   . Thyroid disease     Past Surgical History:  Procedure Laterality Date  . ABDOMINAL HYSTERECTOMY    . APPENDECTOMY    . CHOLECYSTECTOMY    . finger sugery    . NECK SURGERY    . TENDON TRANSFER Right 11/06/2019   Procedure: PERONEAL RECONSTRUCTION WITH PERONEAL LONGUS RIGHT LEG;  Surgeon: Newt Minion, MD;  Location: [place];  Service: Orthopedics;  Laterality: Right;    There were no vitals filed for this visit.   Subjective Assessment - 08/25/20 0930    Subjective Her left hip & down back of thigh hurt a great deal after last PT session.    Patient Stated Goals Improve standing and walking endurance and comfort.    Currently in Pain? Yes    Pain Score 8     Pain Location Leg    Pain Orientation Left;Posterior    Pain Descriptors / Indicators Sharp;Sore;Aching    Pain Type Chronic pain    Pain Onset More than a month ago    Pain Frequency Constant    Aggravating Factors  new exercises    Pain Relieving Factors TENS, meds              [facility] PT Assessment - 08/25/20 0001       Observation/Other Assessments   Focus on Therapeutic Outcomes (FOTO)  55.8942   initial was 28.8296                        [facility] Adult PT Treatment/Exercise - 08/25/20 0930      Self-Care   Other Self-Care Comments  self management options for muscle spasms / trigger points: 1. 2 tennis balls or Yoga foam roller  to pressure points   2.Hypervolt massager   3.Theracane massager   Pt verbalized & return demo understanding.        Neuro Re-ed    Neuro Re-ed Details  --      Exercises   Exercises --      Knee/Hip Exercises: Stretches   Gastroc Stretch --    Gastroc Stretch Limitations --      Knee/Hip Exercises: Aerobic   Nustep --      Knee/Hip Exercises: Machines for Strengthening   Cybex Knee Extension --    Cybex Knee Flexion --    Cybex Leg Press --      Knee/Hip Exercises: Standing  Heel Raises --    Heel Raises Limitations --    Forward Step Up --    Step Down --    Rocker Board --    Rocker Board Limitations --    Other Standing Knee Exercises --      Knee/Hip Exercises: Seated   Sit to Sand --                  PT Education - 08/25/20 1548    Education Details reviewed HEP and ongoing fitness plan    Person(s) Educated Patient    Methods Explanation;Verbal cues    Comprehension Verbalized understanding               PT Long Term Goals - 08/25/20 1549      PT LONG TERM GOAL #1   Title Improve FOTO Functional Score to 50.    Baseline MET 08/25/2020  FOTO score 56%    Time 8    Period Weeks    Status Achieved      PT LONG TERM GOAL #2   Title Improve R ankle pain to 0-2/10 and B knee pain to consistently 0-3/10 on the Numeric Pain Rating Scale.    Time 8    Period Weeks    Status On-going    Target Date 08/26/20      PT LONG TERM GOAL #3   Title Improve R ankle and B quadriceps strength to 5/5 MMT.    Time 8    Period Weeks    Status On-going    Target Date 08/26/20      PT LONG TERM GOAL #4   Title Improve B knee  flexion AROM to 125 degrees and B ankle dorsiflexion to 10 degrees.    Time 8    Period Weeks    Status On-going    Target Date 08/26/20      PT LONG TERM GOAL #5   Title Sarin will be independent with her long-term HEP at [place].    Baseline MET 08/25/2020    Time 8    Period Weeks    Status Achieved                 Plan - 08/25/20 0933    Clinical Impression Statement She met 2 LTGs that were checked today.  PT instructed in self care management of muscle spasms & pain.  She verbalizes understanding.    Personal Factors and Comorbidities Comorbidity 1    Comorbidities Fibromyalgia    Examination-Activity Limitations Carry;Stand;Stairs;Locomotion Level;Squat;Lift    Examination-Participation Restrictions Community Activity    Stability/Clinical Decision Making Stable/Uncomplicated    Rehab Potential Good    PT Frequency 2x / week    PT Duration 8 weeks    PT Treatment/Interventions ADLs/Self Care Home Management;Cryotherapy;Therapeutic activities;Stair training;Gait training;Therapeutic exercise;Balance training;Neuromuscular re-education;Patient/family education;Vasopneumatic Device    PT Next Visit Plan check remaining LTGs and discharge    PT Home Exercise Plan Access Code: [place] and Agree with Plan of Care Patient           Patient will benefit from skilled therapeutic intervention in order to improve the following deficits and impairments:  Abnormal gait, Decreased activity tolerance, Decreased balance, Decreased endurance, Decreased range of motion, Decreased strength, Difficulty walking, Impaired flexibility, Pain, Increased edema  Visit Diagnosis: Difficulty walking  Chronic pain of left knee  Chronic pain of right knee  Muscle weakness (generalized)  Pain in right ankle and joints of right  foot  Stiffness of left knee, not elsewhere classified  Stiffness of right knee, not elsewhere classified  Stiffness of right ankle, not elsewhere  classified  Localized edema     Problem List Patient Active Problem List   Diagnosis Date Noted  . Peroneal tendinitis, right leg   . Constipation 08/14/2019  . Multiple atypical nevi 08/14/2019  . Haglund's deformity of right heel 06/25/2019  . Concussion 06/21/2019  . Acute right ankle pain 03/21/2019  . Hypothyroidism 03/21/2019  . Sore throat 01/23/2019  . Acute otitis media 01/23/2019  . Cough in adult 12/20/2018  . Healthcare maintenance 10/23/2018  . Chronic joint pain 10/23/2018  . Breast cancer screening 10/23/2018  . DDD (degenerative disc disease), lumbar 10/23/2018  . Muscle pain, fibromyalgia 10/23/2018    Jamey Reas PT, DPT 08/25/2020, 3:54 PM  [facility] [address] [place], [place], [address] Phone: [phone]   Fax:  [phone]  Name: TRINIDEE SCHRAG MRN: [id number] Date of Birth: August 05, 1966

## 2020-08-26 ENCOUNTER — Encounter: Payer: Self-pay | Admitting: Physical Therapy

## 2020-08-26 ENCOUNTER — Ambulatory Visit (INDEPENDENT_AMBULATORY_CARE_PROVIDER_SITE_OTHER): Payer: Federal, State, Local not specified - PPO | Admitting: Physical Therapy

## 2020-08-26 DIAGNOSIS — M25662 Stiffness of left knee, not elsewhere classified: Secondary | ICD-10-CM

## 2020-08-26 DIAGNOSIS — M6281 Muscle weakness (generalized): Secondary | ICD-10-CM

## 2020-08-26 DIAGNOSIS — M25661 Stiffness of right knee, not elsewhere classified: Secondary | ICD-10-CM | POA: Diagnosis not present

## 2020-08-26 DIAGNOSIS — M25571 Pain in right ankle and joints of right foot: Secondary | ICD-10-CM

## 2020-08-26 DIAGNOSIS — G8929 Other chronic pain: Secondary | ICD-10-CM

## 2020-08-26 DIAGNOSIS — R262 Difficulty in walking, not elsewhere classified: Secondary | ICD-10-CM

## 2020-08-26 DIAGNOSIS — M25561 Pain in right knee: Secondary | ICD-10-CM | POA: Diagnosis not present

## 2020-08-26 DIAGNOSIS — M25562 Pain in left knee: Secondary | ICD-10-CM | POA: Diagnosis not present

## 2020-08-26 NOTE — Therapy (Signed)
[facility] [address] [place], [place], [address] Phone: [phone]   Fax:  [phone]  Physical Therapy Treatment & Discharge Summary  Patient Details  Name: Pamela Esparza MRN: [id number] Date of Birth: January 08, 1966 Referring Provider (PT): Pamela Minion MD   Encounter Date: 08/26/2020  PHYSICAL THERAPY DISCHARGE SUMMARY  Visits from Start of Care: 10  Current functional level related to goals / functional outcomes: See below   Remaining deficits: See below   Education / Equipment: HEP & ongoing fitness plan  Plan: Patient agrees to discharge.  Patient goals were met. Patient is being discharged due to meeting the stated rehab goals.  ?????             PT End of Session - 08/26/20 1026    Visit Number 10    Number of Visits 16    PT Start Time 1022    PT Stop Time 1045    PT Time Calculation (min) 23 min    Activity Tolerance Patient tolerated treatment well;No increased pain    Behavior During Therapy WFL for tasks assessed/performed           Past Medical History:  Diagnosis Date  . Anxiety   . Arthritis   . Asthma   . Cataract   . DDD (degenerative disc disease), lumbar   . Depression   . Fibromyalgia   . Hyperlipidemia   . Joint pain   . Neuropathy   . Sjogren's disease ([place])   . Thyroid disease     Past Surgical History:  Procedure Laterality Date  . ABDOMINAL HYSTERECTOMY    . APPENDECTOMY    . CHOLECYSTECTOMY    . finger sugery    . NECK SURGERY    . TENDON TRANSFER Right 11/06/2019   Procedure: PERONEAL RECONSTRUCTION WITH PERONEAL LONGUS RIGHT LEG;  Surgeon: Pamela Minion, MD;  Location: [place];  Service: Orthopedics;  Laterality: Right;    There were no vitals filed for this visit.   Subjective Assessment - 08/26/20 1024    Subjective She ordered TheraCane last night.    Patient Stated Goals Improve standing and walking endurance and comfort.    Currently in Pain?  Yes    Pain Score 3     Pain Location Knee    Pain Orientation Right;Left    Pain Descriptors / Indicators Aching;Sore    Pain Onset More than a month ago    Aggravating Factors  arthritis, weather    Pain Relieving Factors meds    Multiple Pain Sites Yes    Pain Score 0    Pain Location Ankle    Pain Orientation Right              [facility] PT Assessment - 08/26/20 0922      Assessment   Medical Diagnosis B knee OA and R ankle pain    Referring Provider (PT) Pamela Minion MD      Observation/Other Assessments   Focus on Therapeutic Outcomes (FOTO)  [phone]   initial was 28.8296     AROM   Right Knee Flexion 121    Left Knee Flexion 125    Right Ankle Dorsiflexion 13   sitting with slight knee flexion   Left Ankle Dorsiflexion 14   sitting with slight knee flexion     Strength   Right Knee Flexion 5/5    Right Knee Extension 5/5    Left Knee Flexion 5/5  Left Knee Extension 5/5    Right Ankle Dorsiflexion 5/5    Right Ankle Plantar Flexion 4/5    Left Ankle Dorsiflexion 5/5    Left Ankle Plantar Flexion 4/5                                      PT Long Term Goals - 08/26/20 1059      PT LONG TERM GOAL #1   Title Improve FOTO Functional Score to 50.    Baseline MET 08/25/2020  FOTO score 56%    Time 8    Period Weeks    Status Achieved      PT LONG TERM GOAL #2   Title Improve R ankle pain to 0-2/10 and B knee pain to consistently 0-3/10 on the Numeric Pain Rating Scale.    Baseline MET 08/26/2020    Time 8    Period Weeks    Status Achieved      PT LONG TERM GOAL #3   Title Improve R ankle and B quadriceps strength to 5/5 MMT.    Baseline MET 08/26/2020    Time 8    Period Weeks    Status Achieved      PT LONG TERM GOAL #4   Title Improve B knee flexion AROM to 125 degrees and B ankle dorsiflexion to 10 degrees.    Baseline Partially MET 08/26/2020    Time 8    Period Weeks    Status Partially Met      PT LONG TERM  GOAL #5   Title [name] will be independent with her long-term HEP at [place].    Baseline MET 08/25/2020    Time 8    Period Weeks    Status Achieved                 Plan - 08/26/20 1101    Clinical Impression Statement Patient met all LTGs set for knee & ankle issues.  She has chronic fibromyalgia & low back issues limiting her function.  PT instructed in benefits & components of well rounded ongoing fitness / exercise program which she appears to understand.    Personal Factors and Comorbidities Comorbidity 1    Comorbidities Fibromyalgia    Examination-Activity Limitations Carry;Stand;Stairs;Locomotion Level;Squat;Lift    Examination-Participation Restrictions Community Activity    Stability/Clinical Decision Making Stable/Uncomplicated    Rehab Potential Good    PT Frequency 2x / week    PT Duration 8 weeks    PT Treatment/Interventions ADLs/Self Care Home Management;Cryotherapy;Therapeutic activities;Stair training;Gait training;Therapeutic exercise;Balance training;Neuromuscular re-education;Patient/family education;Vasopneumatic Device    PT Next Visit Plan discharge    PT Home Exercise Plan Access Code: [place] and Agree with Plan of Care Patient           Patient will benefit from skilled therapeutic intervention in order to improve the following deficits and impairments:  Abnormal gait, Decreased activity tolerance, Decreased balance, Decreased endurance, Decreased range of motion, Decreased strength, Difficulty walking, Impaired flexibility, Pain, Increased edema  Visit Diagnosis: Difficulty walking  Chronic pain of left knee  Chronic pain of right knee  Muscle weakness (generalized)  Pain in right ankle and joints of right foot  Stiffness of left knee, not elsewhere classified  Stiffness of right knee, not elsewhere classified     Problem List Patient Active Problem List   Diagnosis Date Noted  . Peroneal tendinitis,  right leg   .  Constipation 08/14/2019  . Multiple atypical nevi 08/14/2019  . Haglund's deformity of right heel 06/25/2019  . Concussion 06/21/2019  . Acute right ankle pain 03/21/2019  . Hypothyroidism 03/21/2019  . Sore throat 01/23/2019  . Acute otitis media 01/23/2019  . Cough in adult 12/20/2018  . Healthcare maintenance 10/23/2018  . Chronic joint pain 10/23/2018  . Breast cancer screening 10/23/2018  . DDD (degenerative disc disease), lumbar 10/23/2018  . Muscle pain, fibromyalgia 10/23/2018    Jamey Reas PT, DPT 08/26/2020, 11:03 AM  [facility] [address] [place], [place], [address] Phone: [phone]   Fax:  [phone]  Name: Pamela Esparza MRN: [id number] Date of Birth: 04/09/1966

## 2020-09-15 ENCOUNTER — Other Ambulatory Visit: Payer: Self-pay | Admitting: Physician Assistant

## 2020-09-15 DIAGNOSIS — Z1231 Encounter for screening mammogram for malignant neoplasm of breast: Secondary | ICD-10-CM

## 2020-09-21 ENCOUNTER — Other Ambulatory Visit: Payer: Self-pay | Admitting: Physician Assistant

## 2020-09-21 DIAGNOSIS — E7800 Pure hypercholesterolemia, unspecified: Secondary | ICD-10-CM

## 2020-09-21 DIAGNOSIS — E039 Hypothyroidism, unspecified: Secondary | ICD-10-CM

## 2020-10-14 ENCOUNTER — Encounter: Payer: Medicare Other | Admitting: Physician Assistant

## 2020-10-14 ENCOUNTER — Other Ambulatory Visit: Payer: Self-pay

## 2020-10-14 ENCOUNTER — Ambulatory Visit (INDEPENDENT_AMBULATORY_CARE_PROVIDER_SITE_OTHER): Payer: Federal, State, Local not specified - PPO | Admitting: Physician Assistant

## 2020-10-14 ENCOUNTER — Encounter: Payer: Self-pay | Admitting: Physician Assistant

## 2020-10-14 VITALS — BP 141/87 | HR 72 | Ht 65.0 in | Wt 277.5 lb

## 2020-10-14 DIAGNOSIS — R002 Palpitations: Secondary | ICD-10-CM

## 2020-10-14 DIAGNOSIS — E7800 Pure hypercholesterolemia, unspecified: Secondary | ICD-10-CM

## 2020-10-14 DIAGNOSIS — J0100 Acute maxillary sinusitis, unspecified: Secondary | ICD-10-CM

## 2020-10-14 DIAGNOSIS — Z0000 Encounter for general adult medical examination without abnormal findings: Secondary | ICD-10-CM | POA: Diagnosis not present

## 2020-10-14 DIAGNOSIS — Z23 Encounter for immunization: Secondary | ICD-10-CM

## 2020-10-14 DIAGNOSIS — R7309 Other abnormal glucose: Secondary | ICD-10-CM | POA: Diagnosis not present

## 2020-10-14 DIAGNOSIS — E039 Hypothyroidism, unspecified: Secondary | ICD-10-CM

## 2020-10-14 DIAGNOSIS — R030 Elevated blood-pressure reading, without diagnosis of hypertension: Secondary | ICD-10-CM

## 2020-10-14 DIAGNOSIS — M3500 Sicca syndrome, unspecified: Secondary | ICD-10-CM

## 2020-10-14 DIAGNOSIS — R1010 Upper abdominal pain, unspecified: Secondary | ICD-10-CM

## 2020-10-14 MED ORDER — ATORVASTATIN CALCIUM 10 MG PO TABS: 10.0000 mg | ORAL_TABLET | Freq: Every day | ORAL | 1 refills | Status: DC

## 2020-10-14 MED ORDER — FLUCONAZOLE 150 MG PO TABS: 150.0000 mg | ORAL_TABLET | Freq: Once | ORAL | 0 refills | Status: AC

## 2020-10-14 MED ORDER — AMOXICILLIN-POT CLAVULANATE 875-125 MG PO TABS: 1.0000 | ORAL_TABLET | Freq: Two times a day (BID) | ORAL | 0 refills | Status: DC

## 2020-10-14 MED ORDER — GABAPENTIN 600 MG PO TABS: 600.0000 mg | ORAL_TABLET | Freq: Three times a day (TID) | ORAL | 3 refills | Status: DC

## 2020-10-14 MED ORDER — LEVOTHYROXINE SODIUM 50 MCG PO TABS: 50.0000 ug | ORAL_TABLET | Freq: Every day | ORAL | 1 refills | Status: DC

## 2020-10-14 NOTE — Patient Instructions (Signed)
Preventive Care 84-57 Years Old, Female Preventive care refers to lifestyle choices and visits with your health care provider that can promote health and wellness. This includes:  A yearly physical exam. This is also called an annual wellness visit.  Regular dental and eye exams.  Immunizations.  Screening for certain conditions.  Healthy lifestyle choices, such as: ? Eating a healthy diet. ? Getting regular exercise. ? Not using drugs or products that contain nicotine and tobacco. ? Limiting alcohol use. What can I expect for my preventive care visit? Physical exam Your health care provider will check your:  Height and weight. These may be used to calculate your BMI (body mass index). BMI is a measurement that tells if you are at a healthy weight.  Heart rate and blood pressure.  Body temperature.  Skin for abnormal spots. Counseling Your health care provider may ask you questions about your:  Past medical problems.  Family's medical history.  Alcohol, tobacco, and drug use.  Emotional well-being.  Home life and relationship well-being.  Sexual activity.  Diet, exercise, and sleep habits.  Work and work Statistician.  Access to firearms.  Method of birth control.  Menstrual cycle.  Pregnancy history. What immunizations do I need? Vaccines are usually given at various ages, according to a schedule. Your health care provider will recommend vaccines for you based on your age, medical history, and lifestyle or other factors, such as travel or where you work.   What tests do I need? Blood tests  Lipid and cholesterol levels. These may be checked every 5 years, or more often if you are over 57 years old.  Hepatitis C test.  Hepatitis B test. Screening  Lung cancer screening. You may have this screening every year starting at age 57 if you have a 30-pack-year history of smoking and currently smoke or have quit within the past 15 years.  Colorectal cancer  screening. ? All adults should have this screening starting at age 57 and continuing until age 17. ? Your health care provider may recommend screening at age 57 if you are at increased risk. ? You will have tests every 1-10 years, depending on your results and the type of screening test.  Diabetes screening. ? This is done by checking your blood sugar (glucose) after you have not eaten for a while (fasting). ? You may have this done every 1-3 years.  Mammogram. ? This may be done every 1-2 years. ? Talk with your health care provider about when you should start having regular mammograms. This may depend on whether you have a family history of breast cancer.  BRCA-related cancer screening. This may be done if you have a family history of breast, ovarian, tubal, or peritoneal cancers.  Pelvic exam and Pap test. ? This may be done every 3 years starting at age 10. ? Starting at age 57, this may be done every 5 years if you have a Pap test in combination with an HPV test. Other tests  STD (sexually transmitted disease) testing, if you are at risk.  Bone density scan. This is done to screen for osteoporosis. You may have this scan if you are at high risk for osteoporosis. Talk with your health care provider about your test results, treatment options, and if necessary, the need for more tests. Follow these instructions at home: Eating and drinking  Eat a diet that includes fresh fruits and vegetables, whole grains, lean protein, and low-fat dairy products.  Take vitamin and mineral supplements  as recommended by your health care provider.  Do not drink alcohol if: ? Your health care provider tells you not to drink. ? You are pregnant, may be pregnant, or are planning to become pregnant.  If you drink alcohol: ? Limit how much you have to 0-1 drink a day. ? Be aware of how much alcohol is in your drink. In the [place] one drink equals one 12 oz bottle of beer (355 mL), one 5 oz glass of  wine (148 mL), or one 1 oz glass of hard liquor (44 mL).   Lifestyle  Take daily care of your teeth and gums. Brush your teeth every morning and night with fluoride toothpaste. Floss one time each day.  Stay active. Exercise for at least 30 minutes 5 or more days each week.  Do not use any products that contain nicotine or tobacco, such as cigarettes, e-cigarettes, and chewing tobacco. If you need help quitting, ask your health care provider.  Do not use drugs.  If you are sexually active, practice safe sex. Use a condom or other form of protection to prevent STIs (sexually transmitted infections).  If you do not wish to become pregnant, use a form of birth control. If you plan to become pregnant, see your health care provider for a prepregnancy visit.  If told by your health care provider, take low-dose aspirin daily starting at age 57.  Find healthy ways to cope with stress, such as: ? Meditation, yoga, or listening to music. ? Journaling. ? Talking to a trusted person. ? Spending time with friends and family. Safety  Always wear your seat belt while driving or riding in a vehicle.  Do not drive: ? If you have been drinking alcohol. Do not ride with someone who has been drinking. ? When you are tired or distracted. ? While texting.  Wear a helmet and other protective equipment during sports activities.  If you have firearms in your house, make sure you follow all gun safety procedures. What's next?  Visit your health care provider once a year for an annual wellness visit.  Ask your health care provider how often you should have your eyes and teeth checked.  Stay up to date on all vaccines. This information is not intended to replace advice given to you by your health care provider. Make sure you discuss any questions you have with your health care provider. Document Revised: 06/17/2020 Document Reviewed: 05/25/2018 Elsevier Patient Education  2021 Elsevier Inc.  

## 2020-10-14 NOTE — Progress Notes (Signed)
Female Physical   Impression and Recommendations:    1. Healthcare maintenance   2. Need for shingles vaccine   3. Subacute maxillary sinusitis   4. Intermittent palpitations   5. Pain of upper abdomen   6. Hypothyroidism, unspecified type   7. Elevated LDL cholesterol level   8. Sjogren's syndrome, with unspecified organ involvement ([place])   9. Elevated blood pressure reading without diagnosis of hypertension      1) Anticipatory Guidance: Skin CA prevention- recommend to use sunscreen when outside along with skin surveillance; eating a balanced and modest diet; physical activity at least 25 minutes per day or minimum of 150 min/ week moderate to intense activity.  2) Immunizations / Screenings / Labs:   All immunizations are up-to-date per recommendations or will be updated today if pt allows.    - Patient understands with dental and vision screens they will schedule independently.  - Will obtain CBC, CMP, HgA1c, Lipid panel, TSH and vit D when fasting, if not already done past 12 mo/ recently.  - UTD on Tdap, had upcoming mammogram 10/24/2020. - Declined Hep C and HIV screenings, and influenza vaccine. - Agreeable to Shingrix vaccine.  3) Weight:  Recommend to improve diet habits to improve overall feelings of well being and objective health data. Improve nutrient density of diet through increasing intake of fruits and vegetables and decreasing saturated fats, white flour products and refined sugars. -Follow a heart healthy diet.  4) Healthcare Maintenance:  -Continue to follow up with various specialists including Rheumatology and PT.  -Will place referrals to cardiology and gastroenterology for today's complaints for further evaluation. No arrhythmia noted on exam today. -Will start antibiotic therapy with Augmentin for maxillary sinusitis and possible developing otitis media, on exam TM's normal. Patient report's history of yeast infection with antibiotics and will send  diflucan. -Continue current medication regimen. Provided refills. -BP mildly elevated and BP recheck slightly improved. Advised to check BP and pulse at home and to notify the clinic if BP consistently >140/90. -Follow up in 4 months for thyroid, HLD  Meds ordered this encounter  Medications  . fluconazole (DIFLUCAN) 150 MG tablet    Sig: Take 1 tablet (150 mg total) by mouth once for 1 dose.    Dispense:  1 tablet    Refill:  0    Order Specific Question:   Supervising Provider    Answer:   Beatrice Lecher D [[id number]]  . amoxicillin-clavulanate (AUGMENTIN) 875-125 MG tablet    Sig: Take 1 tablet by mouth 2 (two) times daily.    Dispense:  14 tablet    Refill:  0    Order Specific Question:   Supervising Provider    Answer:   Beatrice Lecher D [[id number]]  . atorvastatin (LIPITOR) 10 MG tablet    Sig: Take 1 tablet (10 mg total) by mouth daily.    Dispense:  90 tablet    Refill:  1  . gabapentin (NEURONTIN) 600 MG tablet    Sig: Take 1 tablet (600 mg total) by mouth 3 (three) times daily.    Dispense:  90 tablet    Refill:  3  . levothyroxine (SYNTHROID) 50 MCG tablet    Sig: Take 1 tablet (50 mcg total) by mouth daily before breakfast.    Dispense:  90 tablet    Refill:  1    Orders Placed This Encounter  Procedures  . Varicella-zoster vaccine IM  . CBC  . Comprehensive metabolic panel  . TSH  .  Lipid panel  . Hemoglobin A1c  . T4, free  . T3  . Ambulatory referral to Cardiology  . Ambulatory referral to Gastroenterology     Return in about 4 months (around 02/11/2021) for Thyroid, HLD.    Gross side effects, risk and benefits, and alternatives of medications discussed with patient.  Patient is aware that all medications have potential side effects and we are unable to predict every side effect or drug-drug interaction that may occur.  Expresses verbal understanding and consents to current therapy plan and treatment regimen.  F-up preventative CPE in 1 year- this  is in addition to any chronic care visits.    Please see orders placed and AVS handed out to patient at the end of our visit for further patient instructions/ counseling done pertaining to today's office visit.   Note:  This note was prepared with assistance of Dragon voice recognition software. Occasional wrong-word or sound-a-like substitutions may have occurred due to the inherent limitations of voice recognition software.     Subjective:     CPE HPI: Pamela Esparza is a 55 y.o. female who presents to [place] [facility] today for a yearly health maintenance exam.   Health Maintenance Summary  - Reviewed and updated, unless pt declines services.  Last Cologuard or Colonoscopy:  09/03/19- repeat in 3 years Tobacco History Reviewed:  Y, never a smoker Alcohol and/or drug use:    No concerns; no use Exercise Habits:   Limited due to chronic joint pain Dental Home: Not established with one yet. Eye exams:Y Dermatology home:Y, not recently.  Female Health:  PAP Smear - last known results:  S/p total hysterectomy STD concerns:   none Menses regular:  n/a Lumps or breast concerns: none  Additional concerns beyond health maintenance issues: Has complaints of upper respiratory symptoms for several weeks- nasal congestion, sinus pressure/headache, and ear fullness. Denies otorrhea, fever, or chills. Also reports having intermittent palpitations and episodes where she feels her heart pauses. No increased dyspnea from baseline or chest pain. Complains of abdominal pain which has become more frequent. No n/v/d.     Immunization History  Administered Date(s) Administered  . Tdap 03/21/2019  . Zoster Recombinat (Shingrix) 10/14/2020     Health Maintenance  Topic Date Due  . COVID-19 Vaccine (1) Never done  . INFLUENZA VACCINE  12/25/2020 (Originally 04/27/2020)  . Hepatitis C Screening  10/14/2021 (Originally May 09, 1966)  . HIV Screening  10/14/2021  (Originally 09/26/1979)  . MAMMOGRAM  10/04/2021  . COLONOSCOPY (Pts 45-30yrs Insurance coverage will need to be confirmed)  09/02/2022  . TETANUS/TDAP  03/20/2029  . PAP SMEAR-Modifier  Discontinued     Wt Readings from Last 3 Encounters:  10/14/20 277 lb 8 oz (125.9 kg)  07/29/20 260 lb (117.9 kg)  05/01/20 260 lb (117.9 kg)   BP Readings from Last 3 Encounters:  10/14/20 (!) 141/87  11/06/19 (!) 146/60  09/03/19 122/63   Pulse Readings from Last 3 Encounters:  10/14/20 72  11/06/19 73  09/03/19 60     Past Medical History:  Diagnosis Date  . Anxiety   . Arthritis   . Asthma   . Cataract   . DDD (degenerative disc disease), lumbar   . Depression   . Depression    Phreesia 10/13/2020  . Fibromyalgia   . Hyperlipidemia   . Joint pain   . Neuromuscular disorder ([place])    Phreesia 10/13/2020  . Neuropathy   . Sjogren's disease (  [place])   . Thyroid disease       Past Surgical History:  Procedure Laterality Date  . ABDOMINAL HYSTERECTOMY    . APPENDECTOMY    . CHOLECYSTECTOMY    . finger sugery    . NECK SURGERY    . TENDON TRANSFER Right 11/06/2019   Procedure: PERONEAL RECONSTRUCTION WITH PERONEAL LONGUS RIGHT LEG;  Surgeon: Newt Minion, MD;  Location: [facility];  Service: Orthopedics;  Laterality: Right;  . TUBAL LIGATION N/A    Phreesia 10/13/2020      Family History  Problem Relation Age of Onset  . Cancer Mother   . Heart attack Father   . Hypertension Father   . Diabetes Father   . Birth defects Maternal Grandmother        ovarian  . Depression Maternal Grandmother   . Birth defects Paternal Grandmother        brain  . Colon cancer Neg Hx   . Colon polyps Neg Hx   . Esophageal cancer Neg Hx   . Stomach cancer Neg Hx   . Rectal cancer Neg Hx       Social History   Substance and Sexual Activity  Drug Use Never  ,   Social History   Substance and Sexual Activity  Alcohol Use Yes   Comment: 4 drinks a year-rare   ,   Social History   Tobacco Use  Smoking Status Never Smoker  Smokeless Tobacco Never Used  ,   Social History   Substance and Sexual Activity  Sexual Activity Not Currently    Current Outpatient Medications on File Prior to Visit  Medication Sig Dispense Refill  . albuterol (VENTOLIN HFA) 108 (90 Base) MCG/ACT inhaler INHALE 1 TO 2 PUFFS INTO THE LUNGS EVERY 6 HOURS AS NEEDED 54 g 0  . ALPRAZolam (XANAX) 0.5 MG tablet Take 0.5 mg by mouth daily as needed.    . baclofen (LIORESAL) 10 MG tablet Take 10 mg by mouth 3 (three) times daily as needed.    . Cholecalciferol (VITAMIN D3) 75 MCG (3000 UT) TABS Take 2,000 Int'l Units by mouth daily.    . DULoxetine (CYMBALTA) 60 MG capsule Take 120 mg by mouth daily.    . fluticasone (FLONASE) 50 MCG/ACT nasal spray SHAKE LIQUID AND USE 1 SPRAY IN EACH NOSTRIL DAILY 16 g 3  . meloxicam (MOBIC) 15 MG tablet Take 15 mg by mouth.    . Multiple Vitamins-Minerals (CENTRUM SILVER PO) Take by mouth.    . nitroGLYCERIN (NITROSTAT) 0.4 MG SL tablet Place under the tongue.    Marland Kitchen oxyCODONE-acetaminophen (PERCOCET) 10-325 MG tablet Take 1 tablet by mouth 2 (two) times daily.    . pilocarpine (SALAGEN) 5 MG tablet Take 5 mg by mouth 3 (three) times daily.     No current facility-administered medications on file prior to visit.    Allergies: Aspirin, Hydrocodone-acetaminophen, Lyrica [pregabalin], and Topiramate  Review of Systems: General:   Denies fever, chills, unexplained weight loss.  Optho/Auditory:   Denies visual changes, blurred vision/LOV Respiratory:   Denies SOB, DOE more than baseline levels.   Cardiovascular:   Denies chest pain, new onset peripheral edema ,+ palpitations Gastrointestinal:   Denies nausea, vomiting, diarrhea, +abd pain Genitourinary: Denies dysuria, freq/ urgency, flank pain or discharge from genitals.  Endocrine:     Denies hot or cold intolerance, polyuria, polydipsia. Musculoskeletal:   Denies unexplained  myalgias, joint swelling, gait problems.  Skin:  Denies rash, suspicious lesions  Neurological:     Denies dizziness, unexplained weakness, numbness  Psychiatric/Behavioral:   Denies mood changes, suicidal or homicidal ideations, hallucinations    Objective:    Blood pressure (!) 141/87, pulse 72, height 5\' 5"  (1.651 m), weight 277 lb 8 oz (125.9 kg), SpO2 100 %. Body mass index is 46.18 kg/m. General Appearance:    Alert, cooperative, no distress, appears stated age  Head:    Normocephalic, without obvious abnormality, atraumatic  Eyes:    PERRL, conjunctiva/corneas clear, EOM's intact, both eyes  Ears:    Normal TM's and external ear canals, both ears  Nose:   Nares normal, septum midline, mild boggy turbinates, TTP of maxillary sinus B/L  Throat:   Lips w/o lesion, mucosa moist, and tongue normal; teeth and   gums normal  Neck:   Supple, symmetrical, trachea midline, submandibular lymph  nodes swollen, thyroid:  no enlargement/tenderness; no JVD  Back:     Symmetric, no curvature, ROM normal, no CVA tenderness  Lungs:     Clear to auscultation bilaterally, respirations unlabored, no       Wh/ R/ R  Chest Wall:    No tenderness or gross deformity; normal excursion   Heart:    Regular rate and rhythm, S1 and S2 normal, no murmur, rub   or gallop  Breast Exam:    Deferred by pt. Has upcoming mammogram.   Abdomen:     Soft, mild TTP of RUQ and epigastric area, bowel sounds active all four quadrants, no masses, no organomegaly  Genitalia:    Deferred by pt. No concerns/sxs. S/p total hysterectomy.  Rectal:    Deferred.  Extremities:   Extremities normal, atraumatic, no cyanosis or significant gross edema  Pulses:   2+ and symmetric all extremities  Skin:   Warm, dry, Skin color, texture, turgor normal, no obvious rashes or lesions Psych: No HI/SI, judgement and insight good, Euthymic mood. Full Affect.  Neurologic:   CNII-XII grossly intact, normal strength, sensation and reflexes  throughout

## 2020-10-15 LAB — COMPREHENSIVE METABOLIC PANEL
ALT: 24 IU/L (ref 0–32)
AST: 24 IU/L (ref 0–40)
Albumin/Globulin Ratio: 1.4 (ref 1.2–2.2)
Albumin: 4.4 g/dL (ref 3.8–4.9)
Alkaline Phosphatase: 142 IU/L — ABNORMAL HIGH (ref 44–121)
BUN/Creatinine Ratio: 15 (ref 9–23)
BUN: 11 mg/dL (ref 6–24)
Bilirubin Total: 0.6 mg/dL (ref 0.0–1.2)
CO2: 26 mmol/L (ref 20–29)
Calcium: 9.8 mg/dL (ref 8.7–10.2)
Chloride: 101 mmol/L (ref 96–106)
Creatinine, Ser: 0.71 mg/dL (ref 0.57–1.00)
GFR calc Af Amer: 110 mL/min/{1.73_m2} (ref >59)
GFR calc non Af Amer: 96 mL/min/{1.73_m2} (ref >59)
Globulin, Total: 3.2 g/dL (ref 1.5–4.5)
Glucose: 112 mg/dL — ABNORMAL HIGH (ref 65–99)
Potassium: 4.8 mmol/L (ref 3.5–5.2)
Sodium: 144 mmol/L (ref 134–144)
Total Protein: 7.6 g/dL (ref 6.0–8.5)

## 2020-10-15 LAB — CBC
Hematocrit: 41.5 % (ref 34.0–46.6)
Hemoglobin: 14 g/dL (ref 11.1–15.9)
MCH: 31.3 pg (ref 26.6–33.0)
MCHC: 33.7 g/dL (ref 31.5–35.7)
MCV: 93 fL (ref 79–97)
Platelets: 353 10*3/uL (ref 150–450)
RBC: 4.48 x10E6/uL (ref 3.77–5.28)
RDW: 12.8 % (ref 11.7–15.4)
WBC: 8 10*3/uL (ref 3.4–10.8)

## 2020-10-15 LAB — LIPID PANEL
Chol/HDL Ratio: 2.7 ratio (ref 0.0–4.4)
Cholesterol, Total: 187 mg/dL (ref 100–199)
HDL: 70 mg/dL (ref >39)
LDL Chol Calc (NIH): 97 mg/dL (ref 0–99)
Triglycerides: 117 mg/dL (ref 0–149)
VLDL Cholesterol Cal: 20 mg/dL (ref 5–40)

## 2020-10-15 LAB — HEMOGLOBIN A1C
Est. average glucose Bld gHb Est-mCnc: 117 mg/dL
Hgb A1c MFr Bld: 5.7 % — ABNORMAL HIGH (ref 4.8–5.6)

## 2020-10-15 LAB — T3: T3, Total: 115 ng/dL (ref 71–180)

## 2020-10-15 LAB — TSH: TSH: 2.82 u[IU]/mL (ref 0.450–4.500)

## 2020-10-15 LAB — T4, FREE: Free T4: 1.39 ng/dL (ref 0.82–1.77)

## 2020-10-17 ENCOUNTER — Telehealth: Payer: Self-pay | Admitting: Physician Assistant

## 2020-10-17 NOTE — Telephone Encounter (Signed)
Pt states she has had 2 covid vaccines.  Pt reports just getting over being sick, so she is going to wait on the booster. Pt will bring her vaccine card to her dr office next time.

## 2020-10-24 ENCOUNTER — Other Ambulatory Visit: Payer: Self-pay

## 2020-10-24 ENCOUNTER — Ambulatory Visit
Admission: RE | Admit: 2020-10-24 | Discharge: 2020-10-24 | Disposition: A | Discharge status: Home / Self Care | Payer: Federal, State, Local not specified - PPO | Source: Ambulatory Visit | Attending: Physician Assistant | Admitting: Physician Assistant

## 2020-10-24 DIAGNOSIS — Z1231 Encounter for screening mammogram for malignant neoplasm of breast: Secondary | ICD-10-CM

## 2020-10-25 NOTE — Progress Notes (Signed)
Cardiology Office Note:    Date:  10/27/2020   ID:  Pamela Esparza, DOB May 22, 1966, MRN [id number]  PCP:  Lorrene Reid, PA-C  [facility] Cardiologist:  Freada Bergeron, MD  [facility] Electrophysiologist:  None   Referring MD: Lorrene Reid, PA-C    History of Present Illness:    Pamela Esparza is a 55 y.o. female with a hx of anxiety, asthma, HLD, fibromyalgia, Sjogren's and depression who was referred by Glori Bickers for further evaluation of palpitations.  Today, the patient states that she has been having intermittent palpitations and associated shortness of breath. Occurs 1-2x/month. Lasts a couple of minutes at most. Usually occurs with exertion. Also notes some shortness of breath with exertion which can come on without the palpitations. This has been ongoing for several months. No associated chest pressure, lightheadedness or dizziness. Resolves with resting.  Also has noted profound fatigue and feels tired all the time. Thinks this started after COVID in 2020 and has persisted since that time. She also has been having significant pain with her fibromyalgia. No known history of coronary disease.  Blood pressure has been slightly elevated at home but has been stressed as she is awaiting to hear if she has breast cancer. Usually runs 110s.   Family history: Father with CAD, CVA.   Past Medical History:  Diagnosis Date  . Anxiety   . Arthritis   . Asthma   . Cataract   . DDD (degenerative disc disease), lumbar   . Depression   . Depression    Phreesia 10/13/2020  . Fibromyalgia   . Hyperlipidemia   . Joint pain   . Neuromuscular disorder ([place])    Phreesia 10/13/2020  . Neuropathy   . Sjogren's disease ([place])   . Thyroid disease     Past Surgical History:  Procedure Laterality Date  . ABDOMINAL HYSTERECTOMY    . APPENDECTOMY    . CHOLECYSTECTOMY    . finger sugery    . NECK SURGERY    . TENDON TRANSFER Right 11/06/2019   Procedure: PERONEAL  RECONSTRUCTION WITH PERONEAL LONGUS RIGHT LEG;  Surgeon: Newt Minion, MD;  Location: [place];  Service: Orthopedics;  Laterality: Right;  . TUBAL LIGATION N/A    Phreesia 10/13/2020    Current Medications: Current Meds  Medication Sig  . albuterol (VENTOLIN HFA) 108 (90 Base) MCG/ACT inhaler INHALE 1 TO 2 PUFFS INTO THE LUNGS EVERY 6 HOURS AS NEEDED  . ALPRAZolam (XANAX) 0.5 MG tablet Take 0.5 mg by mouth daily as needed.  Marland Kitchen atorvastatin (LIPITOR) 10 MG tablet Take 1 tablet (10 mg total) by mouth daily.  . baclofen (LIORESAL) 10 MG tablet Take 10 mg by mouth 3 (three) times daily as needed.  . Cholecalciferol (VITAMIN D3) 75 MCG (3000 UT) TABS Take 2,000 Int'l Units by mouth daily.  . DULoxetine (CYMBALTA) 60 MG capsule Take 120 mg by mouth daily.  . fluticasone (FLONASE) 50 MCG/ACT nasal spray SHAKE LIQUID AND USE 1 SPRAY IN EACH NOSTRIL DAILY  . gabapentin (NEURONTIN) 600 MG tablet Take 1 tablet (600 mg total) by mouth 3 (three) times daily.  Marland Kitchen levothyroxine (SYNTHROID) 50 MCG tablet Take 1 tablet (50 mcg total) by mouth daily before breakfast.  . meloxicam (MOBIC) 15 MG tablet Take 15 mg by mouth.  . Multiple Vitamins-Minerals (CENTRUM SILVER PO) Take by mouth.  . nitroGLYCERIN (NITROSTAT) 0.4 MG SL tablet Place under the tongue.  Marland Kitchen oxyCODONE-acetaminophen (PERCOCET) 10-325 MG tablet Take  1 tablet by mouth 2 (two) times daily.  . pilocarpine (SALAGEN) 5 MG tablet Take 5 mg by mouth 3 (three) times daily.     Allergies:   Aspirin, Hydrocodone-acetaminophen, Lyrica [pregabalin], and Topiramate   Social History   Socioeconomic History  . Marital status: Married    Spouse name: Not on file  . Number of children: Not on file  . Years of education: Not on file  . Highest education level: Not on file  Occupational History  . Not on file  Tobacco Use  . Smoking status: Never Smoker  . Smokeless tobacco: Never Used  Vaping Use  . Vaping Use: Never used   Substance and Sexual Activity  . Alcohol use: Yes    Comment: 4 drinks a year-rare  . Drug use: Never  . Sexual activity: Not Currently  Other Topics Concern  . Not on file  Social History Narrative  . Not on file   Social Determinants of Health   Financial Resource Strain: Not on file  Food Insecurity: Not on file  Transportation Needs: Not on file  Physical Activity: Not on file  Stress: Not on file  Social Connections: Not on file     Family History: The patient's family history includes Birth defects in her maternal grandmother and paternal grandmother; Cancer in her mother; Depression in her maternal grandmother; Diabetes in her father; Heart attack in her father; Hypertension in her father. There is no history of Colon cancer, Colon polyps, Esophageal cancer, Stomach cancer, or Rectal cancer.  ROS:   Please see the history of present illness.    Review of Systems  Constitutional: Positive for malaise/fatigue. Negative for chills and fever.  HENT: Negative for congestion.   Eyes: Negative for blurred vision.  Respiratory: Positive for shortness of breath.   Cardiovascular: Positive for palpitations. Negative for chest pain, orthopnea, claudication, leg swelling and PND.  Gastrointestinal: Negative for melena, nausea and vomiting.  Genitourinary: Negative for hematuria.  Musculoskeletal: Positive for joint pain and myalgias.  Neurological: Negative for dizziness and loss of consciousness.  Endo/Heme/Allergies: Negative for polydipsia.  Psychiatric/Behavioral: Negative for substance abuse. The patient is nervous/anxious and has insomnia.     EKGs/Labs/Other Studies Reviewed:    The following studies were reviewed today: No cardiac studies in our system  EKG:  EKG is  ordered today.  The ekg ordered today demonstrates NSR with HR 73  Recent Labs: 10/14/2020: ALT 24; BUN 11; Creatinine, Ser 0.71; Hemoglobin 14.0; Platelets 353; Potassium 4.8; Sodium 144; TSH 2.820   Recent Lipid Panel    Component Value Date/Time   CHOL 187 10/14/2020 1109   TRIG 117 10/14/2020 1109   HDL 70 10/14/2020 1109   CHOLHDL 2.7 10/14/2020 1109   LDLCALC 97 10/14/2020 1109   LDLDIRECT 146 (H) 08/14/2019 1155     Physical Exam:    VS:  BP (!) 140/92   Pulse 73   Ht $R'5\' 5"'Xs$  (1.651 m)   Wt 276 lb 9.6 oz (125.5 kg)   SpO2 98%   BMI 46.03 kg/m     Wt Readings from Last 3 Encounters:  10/27/20 276 lb 9.6 oz (125.5 kg)  10/14/20 277 lb 8 oz (125.9 kg)  07/29/20 260 lb (117.9 kg)     GEN:  Well nourished, well developed in no acute distress HEENT: Normal NECK: No JVD; No carotid bruits CARDIAC: RRR, no murmurs, rubs, gallops RESPIRATORY:  Clear to auscultation without rales, wheezing or rhonchi  ABDOMEN: Soft, non-tender, non-distended  MUSCULOSKELETAL:  No edema; No deformity  SKIN: Warm and dry NEUROLOGIC:  Alert and oriented x 3 PSYCHIATRIC:  Normal affect   ASSESSMENT:    1. Shortness of breath   2. Chest pain, unspecified type   3. Palpitations   4. Pure hypercholesterolemia    PLAN:    In order of problems listed above:  #Palpitations: Patient with 1-2 episodes of palpitations every month where she feels like her heart is racing. Each episode lasts a couple of minutes before resolving. Has associated shortness of breath. No chest pain, lightheadedness, dizziness or syncope. Symptoms worsened with exertion but can occur at rest. No known cardiac history. -Check 30day monitor -Check TTE -TSH normal, HgB 14  #DOE: Patient notes significant DOE sometimes associated with the palpitations detailed above. Symptoms resolve with sitting down and resting. May be related to residual effects of COVID, however, patient has risk factors including HLD, chronic inflammatory disease, and family history. Will check coronary CTA and TTE as above. -Check coronary CTA -Check TTE as above  #HLD:  LDL 97 -Continue atorvastatin 10mg  daily -Can adjust as needed  pending coronary CTA  #Fibromyalgia: #Chronic Pain: #Restless Leg Syndrome: -Management per primary care     Medication Adjustments/Labs and Tests Ordered: Current medicines are reviewed at length with the patient today.  Concerns regarding medicines are outlined above.  Orders Placed This Encounter  Procedures  . CT CORONARY MORPH W/CTA COR W/SCORE W/CA W/CM &/OR WO/CM  . CT CORONARY FRACTIONAL FLOW RESERVE DATA PREP  . CT CORONARY FRACTIONAL FLOW RESERVE FLUID ANALYSIS  . CARDIAC EVENT MONITOR  . EKG 12-Lead  . ECHOCARDIOGRAM COMPLETE   No orders of the defined types were placed in this encounter.   Patient Instructions  Medication Instructions:  Your provider recommends that you continue on your current medications as directed. Please refer to the Current Medication list given to you today.   *If you need a refill on your cardiac medications before your next appointment, please call your pharmacy*  Testing/Procedures: Dr. Johney Frame recommends you wear a heart monitor for 30 days. This will be mailed to your home.  Dr. Johney Frame recommends you have a CARDIAC CT.  Your physician has requested that you have an echocardiogram. Echocardiography is a painless test that uses sound waves to create images of your heart. It provides your doctor with information about the size and shape of your heart and how well your heart's chambers and valves are working. This procedure takes approximately one hour. There are no restrictions for this procedure.  Follow-Up: Your provider recommends that you schedule a follow-up appointment in 3 months (Dr. Johney Frame).   [employer] Cardiac Event Monitor Instructions Your physician has requested you wear your cardiac event monitor for 30 days. [employer] may call or text to confirm a shipping address. The monitor will be sent to a land address via UPS. [employer] will not ship a monitor to a PO BOX. It typically takes 3-5 days to receive your monitor  after it has been enrolled. [employer] will assist with USPS tracking if your package is delayed. The telephone number for [employer] is [phone]. Once you have received your monitor, please review the enclosed instructions. Instruction tutorials can also be viewed under help and settings on the enclosed cell phone. Your monitor has already been registered assigning a specific monitor serial # to you.  Applying the monitor Remove cell phone from case and turn it on. The cell phone works as Dealer and needs to be  within 10 feet of you at all times. The cell phone will need to be charged on a daily basis. We recommend you plug the cell phone into the enclosed charger at your bedside table every night.  Monitor batteries: You will receive two monitor batteries labelled #1 and #2. These are your recorders. Plug battery #2 onto the second connection on the enclosed charger. Keep one battery on the charger at all times. This will keep the monitor battery deactivated. It will also keep it fully charged for when you need to switch your monitor batteries. A small light will be blinking on the battery emblem when it is charging. The light on the battery emblem will remain on when the battery is fully charged.  Open package of a Monitor strip. Insert battery #1 into black hood on strip and gently squeeze monitor battery onto connection as indicated in instruction booklet. Set aside while preparing skin.  Choose location for your strip, vertical or horizontal, as indicated in the instruction booklet. Shave to remove all hair from location. There cannot be any lotions, oils, powders, or colognes on skin where monitor is to be applied. Wipe skin clean with enclosed Saline wipe. Dry skin completely.  Peel paper labeled #1 off the back of the Monitor strip exposing the adhesive. Place the monitor on the chest in the vertical or horizontal position shown in the instruction booklet. One arrow  on the monitor strip must be pointing upward. Carefully remove paper labeled #2, attaching remainder of strip to your skin. Try not to create any folds or wrinkles in the strip as you apply it.  Firmly press and release the circle in the center of the monitor battery. You will hear a small beep. This is turning the monitor battery on. The heart emblem on the monitor battery will light up every 5 seconds if the monitor battery in turned on and connected to the patient securely. Do not push and hold the circle down as this turns the monitor battery off. The cell phone will locate the monitor battery. A screen will appear on the cell phone checking the connection of your monitor strip. This may read poor connection initially but change to good connection within the next minute. Once your monitor accepts the connection you will hear a series of 3 beeps followed by a climbing crescendo of beeps. A screen will appear on the cell phone showing the two monitor strip placement options. Touch the picture that demonstrates where you applied the monitor strip.  Your monitor strip and battery are waterproof. You are able to shower, bathe, or swim with the monitor on. They just ask you do not submerge deeper than 3 feet underwater. We recommend removing the monitor if you are swimming in a lake, river, or ocean.  Your monitor battery will need to be switched to a fully charged monitor battery approximately once a week. The cell phone will alert you of an action which needs to be made.  On the cell phone, tap for details to reveal connection status, monitor battery status, and cell phone battery status. The green dots indicates your monitor is in good status. A red dot indicates there is something that needs your attention.  To record a symptom, click the circle on the monitor battery. In 30-60 seconds a list of symptoms will appear on the cell phone. Select your symptom and tap save. Your monitor will  record a sustained or significant arrhythmia regardless of you clicking the button. Some patients  do not feel the heart rhythm irregularities. [employer] will notify us of any serious or critical events.  Refer to instruction booklet for instructions on switching batteries, changing strips, the Do not disturb or Pause features, or any additional questions.  Call [employer] at [phone], to confirm your monitor is transmitting and record your baseline. They will answer any questions you may have regarding the monitor instructions at that time.  Returning the monitor to [place] all equipment back into blue box. Peel off strip of paper to expose adhesive and close box securely. There is a prepaid UPS shipping label on this box. Drop in a UPS drop box, or at a UPS facility like [employer]. You may also contact [employer] to arrange UPS to pick up monitor package at your home.   CARDIAC CT INFORMATION: Your cardiac CT will be scheduled at one of the below locations:   [facility] [address] [place], [place] [address] [phone] [place] [address] [place], [place] [address] [phone]  If scheduled at [facility], please arrive at the [facility] main entrance of [facility] 30 minutes prior to test start time. Proceed to the [facility] Radiology Department (first floor) to check-in and test prep.  If scheduled at [facility], please arrive 15 mins early for check-in and test prep.  Please follow these instructions carefully:  On the Night Before the Test: . Be sure to Drink plenty of water. . Do not consume any caffeinated/decaffeinated beverages or chocolate 12 hours prior to your test. . Do not take any antihistamines 12 hours prior to your test.  On the Day of the Test: . Drink plenty of water. Do not drink any water within one hour of the  test. . Do not eat any food 4 hours prior to the test. . You may take your regular medications prior to the test.  . Take metoprolol (Lopressor) 50 mg two hours prior to test. Bring the 2nd tablet with you to your CT. Marland Kitchen FEMALES- please wear underwire-free bra if available  After the Test: . Drink plenty of water. . After receiving IV contrast, you may experience a mild flushed feeling. This is normal. . On occasion, you may experience a mild rash up to 24 hours after the test. This is not dangerous. If this occurs, you can take Benadryl 25 mg and increase your fluid intake. . If you experience trouble breathing, this can be serious. If it is severe call 911 IMMEDIATELY. If it is mild, please call our office.   Once we have confirmed authorization from your insurance company, we will call you to set up a date and time for your test. Based on how quickly your insurance processes prior authorizations requests, please allow up to 4 weeks to be contacted for scheduling your Cardiac CT appointment. Be advised that routine Cardiac CT appointments could be scheduled as many as 8 weeks after your provider has ordered it.  For non-scheduling related questions, please contact the cardiac imaging nurse navigator should you have any questions/concerns: Marchia Bond, Cardiac Imaging Nurse Navigator Burley Saver, Interim Cardiac Imaging Nurse [facility] [facility] Direct Office Dial: [phone]   For scheduling needs, including cancellations and rescheduling, please call [place], [phone].    Signed, Freada Bergeron, MD  10/27/2020 3:33 PM    [place]

## 2020-10-27 ENCOUNTER — Encounter: Payer: Self-pay | Admitting: *Deleted

## 2020-10-27 ENCOUNTER — Other Ambulatory Visit: Payer: Self-pay

## 2020-10-27 ENCOUNTER — Ambulatory Visit (INDEPENDENT_AMBULATORY_CARE_PROVIDER_SITE_OTHER): Payer: Federal, State, Local not specified - PPO | Admitting: Cardiology

## 2020-10-27 ENCOUNTER — Encounter: Payer: Self-pay | Admitting: Cardiology

## 2020-10-27 VITALS — BP 140/92 | HR 73 | Ht 65.0 in | Wt 276.6 lb

## 2020-10-27 DIAGNOSIS — R0602 Shortness of breath: Secondary | ICD-10-CM

## 2020-10-27 DIAGNOSIS — E7800 Pure hypercholesterolemia, unspecified: Secondary | ICD-10-CM

## 2020-10-27 DIAGNOSIS — R002 Palpitations: Secondary | ICD-10-CM | POA: Diagnosis not present

## 2020-10-27 DIAGNOSIS — R079 Chest pain, unspecified: Secondary | ICD-10-CM | POA: Diagnosis not present

## 2020-10-27 NOTE — Patient Instructions (Addendum)
Medication Instructions:  Your provider recommends that you continue on your current medications as directed. Please refer to the Current Medication list given to you today.   *If you need a refill on your cardiac medications before your next appointment, please call your pharmacy*  Testing/Procedures: Dr. Johney Frame recommends you wear a heart monitor for 30 days. This will be mailed to your home.  Dr. Johney Frame recommends you have a CARDIAC CT.  Your physician has requested that you have an echocardiogram. Echocardiography is a painless test that uses sound waves to create images of your heart. It provides your doctor with information about the size and shape of your heart and how well your heart's chambers and valves are working. This procedure takes approximately one hour. There are no restrictions for this procedure.  Follow-Up: Your provider recommends that you schedule a follow-up appointment in 3 months (Dr. Johney Frame).   [employer] Cardiac Event Monitor Instructions Your physician has requested you wear your cardiac event monitor for 30 days. [employer] may call or text to confirm a shipping address. The monitor will be sent to a land address via UPS. [employer] will not ship a monitor to a PO BOX. It typically takes 3-5 days to receive your monitor after it has been enrolled. [employer] will assist with USPS tracking if your package is delayed. The telephone number for [employer] is [phone]. Once you have received your monitor, please review the enclosed instructions. Instruction tutorials can also be viewed under help and settings on the enclosed cell phone. Your monitor has already been registered assigning a specific monitor serial # to you.  Applying the monitor Remove cell phone from case and turn it on. The cell phone works as Dealer and needs to be within [employer] of you at all times. The cell phone will need to be charged on a daily basis. We recommend you  plug the cell phone into the enclosed charger at your bedside table every night.  Monitor batteries: You will receive two monitor batteries labelled #1 and #2. These are your recorders. Plug battery #2 onto the second connection on the enclosed charger. Keep one battery on the charger at all times. This will keep the monitor battery deactivated. It will also keep it fully charged for when you need to switch your monitor batteries. A small light will be blinking on the battery emblem when it is charging. The light on the battery emblem will remain on when the battery is fully charged.  Open package of a Monitor strip. Insert battery #1 into black hood on strip and gently squeeze monitor battery onto connection as indicated in instruction booklet. Set aside while preparing skin.  Choose location for your strip, vertical or horizontal, as indicated in the instruction booklet. Shave to remove all hair from location. There cannot be any lotions, oils, powders, or colognes on skin where monitor is to be applied. Wipe skin clean with enclosed Saline wipe. Dry skin completely.  Peel paper labeled #1 off the back of the Monitor strip exposing the adhesive. Place the monitor on the chest in the vertical or horizontal position shown in the instruction booklet. One arrow on the monitor strip must be pointing upward. Carefully remove paper labeled #2, attaching remainder of strip to your skin. Try not to create any folds or wrinkles in the strip as you apply it.  Firmly press and release the circle in the center of the monitor battery. You will hear a small beep. This is turning the  monitor battery on. The heart emblem on the monitor battery will light up every 5 seconds if the monitor battery in turned on and connected to the patient securely. Do not push and hold the circle down as this turns the monitor battery off. The cell phone will locate the monitor battery. A screen will appear on the cell phone  checking the connection of your monitor strip. This may read poor connection initially but change to good connection within the next minute. Once your monitor accepts the connection you will hear a series of 3 beeps followed by a climbing crescendo of beeps. A screen will appear on the cell phone showing the two monitor strip placement options. Touch the picture that demonstrates where you applied the monitor strip.  Your monitor strip and battery are waterproof. You are able to shower, bathe, or swim with the monitor on. They just ask you do not submerge deeper than 3 feet underwater. We recommend removing the monitor if you are swimming in a lake, river, or ocean.  Your monitor battery will need to be switched to a fully charged monitor battery approximately once a week. The cell phone will alert you of an action which needs to be made.  On the cell phone, tap for details to reveal connection status, monitor battery status, and cell phone battery status. The green dots indicates your monitor is in good status. A red dot indicates there is something that needs your attention.  To record a symptom, click the circle on the monitor battery. In 30-60 seconds a list of symptoms will appear on the cell phone. Select your symptom and tap save. Your monitor will record a sustained or significant arrhythmia regardless of you clicking the button. Some patients do not feel the heart rhythm irregularities. [employer] will notify us of any serious or critical events.  Refer to instruction booklet for instructions on switching batteries, changing strips, the Do not disturb or Pause features, or any additional questions.  Call [employer] at [phone], to confirm your monitor is transmitting and record your baseline. They will answer any questions you may have regarding the monitor instructions at that time.  Returning the monitor to [employer] all equipment back into blue box. Peel off strip  of paper to expose adhesive and close box securely. There is a prepaid UPS shipping label on this box. Drop in a UPS drop box, or at a UPS facility like [employer]. You may also contact [employer] to arrange UPS to pick up monitor package at your home.   CARDIAC CT INFORMATION: Your cardiac CT will be scheduled at one of the below locations:   [facility] [address] [place], [place] [address] [phone] [place], [place] [address] [phone]  If [facility], please arrive at the [facility] main entrance of [facility] 30 minutes prior to test start time. Proceed to the [facility] Radiology Department (first floor) to check-in and test prep.  If scheduled at [facility], please arrive 15 mins early for check-in and test prep.  Please follow these instructions carefully:  On the Night Before the Test: . Be sure to Drink plenty of water. . Do not consume any caffeinated/decaffeinated beverages or chocolate 12 hours prior to your test. . Do not take any antihistamines 12 hours prior to your test.  On the Day of the Test: . Drink plenty of  water. Do not drink any water within one hour of the test. . Do not eat any food 4 hours prior to the test. . You may take your regular medications prior to the test.  . Take metoprolol (Lopressor) 50 mg two hours prior to test. Bring the 2nd tablet with you to your CT. Marland Kitchen FEMALES- please wear underwire-free bra if available  After the Test: . Drink plenty of water. . After receiving IV contrast, you may experience a mild flushed feeling. This is normal. . On occasion, you may experience a mild rash up to 24 hours after the test. This is not dangerous. If this occurs, you can take Benadryl 25 mg and increase your fluid intake. . If you experience trouble breathing, this can be serious. If it is  severe call 911 IMMEDIATELY. If it is mild, please call our office.   Once we have confirmed authorization from your insurance company, we will call you to set up a date and time for your test. Based on how quickly your insurance processes prior authorizations requests, please allow up to 4 weeks to be contacted for scheduling your Cardiac CT appointment. Be advised that routine Cardiac CT appointments could be scheduled as many as 8 weeks after your provider has ordered it.  For non-scheduling related questions, please contact the cardiac imaging nurse navigator should you have any questions/concerns: Marchia Bond, Cardiac Imaging Nurse Navigator Burley Saver, Interim Cardiac Imaging Nurse [facility] Direct Office Dial: [phone]   For scheduling needs, including cancellations and rescheduling, please call [place], [phone].

## 2020-10-27 NOTE — Progress Notes (Signed)
Patient ID: Pamela Esparza, female   DOB: [birth year], 55 y.o.   MRN: [id number] Patient enrolled for [employer] to ship a 30 day cardiac event monitor to her home.

## 2020-10-28 MED ORDER — METOPROLOL TARTRATE 50 MG PO TABS: ORAL_TABLET | ORAL | 0 refills | Status: DC

## 2020-10-28 NOTE — Addendum Note (Signed)
Addended by: Harland German A on: 10/28/2020 04:06 PM   Modules accepted: Orders

## 2020-10-29 ENCOUNTER — Ambulatory Visit: Payer: Medicare Other | Admitting: Cardiology

## 2020-10-29 DIAGNOSIS — M47816 Spondylosis without myelopathy or radiculopathy, lumbar region: Secondary | ICD-10-CM | POA: Diagnosis not present

## 2020-10-29 DIAGNOSIS — M5136 Other intervertebral disc degeneration, lumbar region: Secondary | ICD-10-CM | POA: Diagnosis not present

## 2020-10-29 DIAGNOSIS — M47812 Spondylosis without myelopathy or radiculopathy, cervical region: Secondary | ICD-10-CM | POA: Diagnosis not present

## 2020-10-29 DIAGNOSIS — M5030 Other cervical disc degeneration, unspecified cervical region: Secondary | ICD-10-CM | POA: Diagnosis not present

## 2020-10-29 DIAGNOSIS — M542 Cervicalgia: Secondary | ICD-10-CM | POA: Diagnosis not present

## 2020-10-29 DIAGNOSIS — M961 Postlaminectomy syndrome, not elsewhere classified: Secondary | ICD-10-CM | POA: Diagnosis not present

## 2020-10-29 DIAGNOSIS — M7918 Myalgia, other site: Secondary | ICD-10-CM | POA: Diagnosis not present

## 2020-10-29 DIAGNOSIS — M170 Bilateral primary osteoarthritis of knee: Secondary | ICD-10-CM | POA: Diagnosis not present

## 2020-10-29 DIAGNOSIS — M461 Sacroiliitis, not elsewhere classified: Secondary | ICD-10-CM | POA: Diagnosis not present

## 2020-10-29 DIAGNOSIS — M545 Low back pain, unspecified: Secondary | ICD-10-CM | POA: Diagnosis not present

## 2020-10-29 DIAGNOSIS — Z79891 Long term (current) use of opiate analgesic: Secondary | ICD-10-CM | POA: Diagnosis not present

## 2020-10-29 DIAGNOSIS — M533 Sacrococcygeal disorders, not elsewhere classified: Secondary | ICD-10-CM | POA: Diagnosis not present

## 2020-10-29 DIAGNOSIS — M4802 Spinal stenosis, cervical region: Secondary | ICD-10-CM | POA: Diagnosis not present

## 2020-10-29 DIAGNOSIS — G8929 Other chronic pain: Secondary | ICD-10-CM | POA: Diagnosis not present

## 2020-10-31 ENCOUNTER — Ambulatory Visit (INDEPENDENT_AMBULATORY_CARE_PROVIDER_SITE_OTHER): Payer: Federal, State, Local not specified - PPO

## 2020-10-31 DIAGNOSIS — R002 Palpitations: Secondary | ICD-10-CM | POA: Diagnosis not present

## 2020-11-12 DIAGNOSIS — R21 Rash and other nonspecific skin eruption: Secondary | ICD-10-CM | POA: Diagnosis not present

## 2020-11-12 DIAGNOSIS — M150 Primary generalized (osteo)arthritis: Secondary | ICD-10-CM | POA: Diagnosis not present

## 2020-11-12 DIAGNOSIS — M797 Fibromyalgia: Secondary | ICD-10-CM | POA: Diagnosis not present

## 2020-11-12 DIAGNOSIS — M3500 Sicca syndrome, unspecified: Secondary | ICD-10-CM | POA: Diagnosis not present

## 2020-11-12 DIAGNOSIS — M7989 Other specified soft tissue disorders: Secondary | ICD-10-CM | POA: Diagnosis not present

## 2020-11-12 DIAGNOSIS — Z6841 Body Mass Index (BMI) 40.0 and over, adult: Secondary | ICD-10-CM | POA: Diagnosis not present

## 2020-11-14 ENCOUNTER — Telehealth: Payer: Self-pay

## 2020-11-14 ENCOUNTER — Ambulatory Visit (HOSPITAL_COMMUNITY): Payer: Federal, State, Local not specified - PPO | Attending: Internal Medicine

## 2020-11-14 ENCOUNTER — Other Ambulatory Visit: Payer: Self-pay

## 2020-11-14 DIAGNOSIS — R079 Chest pain, unspecified: Secondary | ICD-10-CM | POA: Insufficient documentation

## 2020-11-14 DIAGNOSIS — R0602 Shortness of breath: Secondary | ICD-10-CM | POA: Insufficient documentation

## 2020-11-14 LAB — ECHOCARDIOGRAM COMPLETE
Area-P 1/2: 4.12 cm2
S' Lateral: 3 cm

## 2020-11-14 NOTE — Telephone Encounter (Signed)
Results released to mychart, left patient a message to call back with questions.

## 2020-11-14 NOTE — Telephone Encounter (Signed)
-----   Message from Freada Bergeron, MD sent at 11/14/2020  4:45 PM EST ----- Echo looks great with normal pumping function and no significant valve abnormalities.

## 2020-11-25 ENCOUNTER — Other Ambulatory Visit: Payer: Self-pay

## 2020-11-25 ENCOUNTER — Ambulatory Visit (INDEPENDENT_AMBULATORY_CARE_PROVIDER_SITE_OTHER): Payer: Federal, State, Local not specified - PPO | Admitting: Gastroenterology

## 2020-11-25 ENCOUNTER — Encounter: Payer: Self-pay | Admitting: Gastroenterology

## 2020-11-25 VITALS — BP 144/85 | HR 80 | Temp 98.4°F | Ht 65.0 in | Wt 278.0 lb

## 2020-11-25 DIAGNOSIS — R748 Abnormal levels of other serum enzymes: Secondary | ICD-10-CM

## 2020-11-25 DIAGNOSIS — R1011 Right upper quadrant pain: Secondary | ICD-10-CM | POA: Diagnosis not present

## 2020-11-25 DIAGNOSIS — R109 Unspecified abdominal pain: Secondary | ICD-10-CM

## 2020-11-26 LAB — GAMMA GT: GGT: 56 IU/L (ref 0–60)

## 2020-11-26 LAB — HEPATIC FUNCTION PANEL
ALT: 44 IU/L — ABNORMAL HIGH (ref 0–32)
AST: 47 IU/L — ABNORMAL HIGH (ref 0–40)
Albumin: 4.4 g/dL (ref 3.8–4.9)
Alkaline Phosphatase: 131 IU/L — ABNORMAL HIGH (ref 44–121)
Bilirubin Total: 0.4 mg/dL (ref 0.0–1.2)
Bilirubin, Direct: 0.12 mg/dL (ref 0.00–0.40)
Total Protein: 7.1 g/dL (ref 6.0–8.5)

## 2020-11-26 NOTE — Progress Notes (Signed)
[name] [address]  [place], [place] [address]  Main: [phone]  Fax: [phone]   Gastroenterology Consultation  Referring Provider:     Lorrene Reid, PA-C Primary Care Physician:  Lorrene Reid, PA-C Reason for Consultation:     Abdominal pain        HPI:    Chief Complaint  Patient presents with  . Abdominal Pain    Pamela Esparza is a 55 y.o. y/o female referred for consultation & management  by Dr. Lorrene Reid, PA-C.  Patient reports epigastric to right upper quadrant pain dull, 5 or 10, nonradiating.  No nausea or vomiting.  No weight loss.  Ongoing for 2 to 3 months.  Present with or without meals.  Labs in January 2022 showed mild elevation in alk phos, and transaminases were normal.  Colonoscopy for screening in December 2020 with 3 polyps removed, largest 12 mm in size.  Polyp showed adenoma and sessile serrated polyp.  Repeat recommended in 3 years  No prior EGD  Past Medical History:  Diagnosis Date  . Anxiety   . Arthritis   . Asthma   . Cataract   . DDD (degenerative disc disease), lumbar   . Depression   . Depression    Phreesia 10/13/2020  . Fibromyalgia   . Hyperlipidemia   . Joint pain   . Neuromuscular disorder ([place])    Phreesia 10/13/2020  . Neuropathy   . Sjogren's disease ([place])   . Thyroid disease     Past Surgical History:  Procedure Laterality Date  . ABDOMINAL HYSTERECTOMY    . APPENDECTOMY    . CHOLECYSTECTOMY    . finger sugery    . NECK SURGERY    . TENDON TRANSFER Right 11/06/2019   Procedure: PERONEAL RECONSTRUCTION WITH PERONEAL LONGUS RIGHT LEG;  Surgeon: Newt Minion, MD;  Location: [facility];  Service: Orthopedics;  Laterality: Right;  . TUBAL LIGATION N/A    Phreesia 10/13/2020    Prior to Admission medications   Medication Sig Start Date End Date Taking? Authorizing Provider  albuterol (VENTOLIN HFA) 108 (90 Base) MCG/ACT inhaler INHALE 1 TO 2 PUFFS INTO THE  LUNGS EVERY 6 HOURS AS NEEDED 03/14/20  Yes Abonza, Maritza, PA-C  ALPRAZolam (XANAX) 0.5 MG tablet Take 0.5 mg by mouth daily as needed.   Yes [provider]  atorvastatin (LIPITOR) 10 MG tablet Take 1 tablet (10 mg total) by mouth daily. 10/14/20  Yes Abonza, Maritza, PA-C  baclofen (LIORESAL) 10 MG tablet Take 10 mg by mouth 3 (three) times daily as needed. 09/29/18  Yes [provider]  Cholecalciferol (VITAMIN D3) 75 MCG (3000 UT) TABS Take 2,000 Int'l Units by mouth daily.   Yes [provider]  DULoxetine (CYMBALTA) 60 MG capsule Take 120 mg by mouth daily. 12/25/15  Yes [provider]  fluticasone (FLONASE) 50 MCG/ACT nasal spray SHAKE LIQUID AND USE 1 SPRAY IN EACH NOSTRIL DAILY 03/21/19  Yes Danford, Katy D, NP  gabapentin (NEURONTIN) 600 MG tablet Take 1 tablet (600 mg total) by mouth 3 (three) times daily. 10/14/20  Yes Lorrene Reid, PA-C  levothyroxine (SYNTHROID) 50 MCG tablet Take 1 tablet (50 mcg total) by mouth daily before breakfast. 10/14/20  Yes Abonza, Maritza, PA-C  meloxicam (MOBIC) 15 MG tablet Take 15 mg by mouth. 11/11/15  Yes [provider]  metoprolol tartrate (LOPRESSOR) 50 MG tablet Take 1 tablet (50 mg) 1-2 hours prior to your cardiac  CT. Bring the second tablet with you to your appointment but don't take unless instructed. 10/28/20  Yes Freada Bergeron, MD  Multiple Vitamins-Minerals (CENTRUM SILVER PO) Take by mouth.   Yes [provider]  nitroGLYCERIN (NITROSTAT) 0.4 MG SL tablet Place under the tongue.   Yes [provider]  oxyCODONE-acetaminophen (PERCOCET) 10-325 MG tablet Take 1 tablet by mouth 2 (two) times daily. 09/18/18  Yes [provider]  pilocarpine (SALAGEN) 5 MG tablet Take 5 mg by mouth 3 (three) times daily. 12/11/15  Yes [provider]    Family History  Problem Relation Age of Onset  . Cancer Mother   . Heart attack Father   . Hypertension Father   . Diabetes  Father   . Birth defects Maternal Grandmother        ovarian  . Depression Maternal Grandmother   . Birth defects Paternal Grandmother        brain  . Colon cancer Neg Hx   . Colon polyps Neg Hx   . Esophageal cancer Neg Hx   . Stomach cancer Neg Hx   . Rectal cancer Neg Hx      Social History   Tobacco Use  . Smoking status: Never Smoker  . Smokeless tobacco: Never Used  Vaping Use  . Vaping Use: Never used  Substance Use Topics  . Alcohol use: Yes    Comment: 4 drinks a year-rare  . Drug use: Never    Allergies as of 11/25/2020 - Review Complete 11/25/2020  Allergen Reaction Noted  . Aspirin Other (See Comments) 03/12/2015  . Hydrocodone-acetaminophen Other (See Comments) 10/23/2018  . Lyrica [pregabalin]  10/23/2018  . Topiramate  06/25/2016    Review of Systems:    All systems reviewed and negative except where noted in HPI.   Physical Exam:  BP (!) 144/85   Pulse 80   Temp 98.4 F (36.9 C) (Oral)   Ht $R'5\' 5"'oZ$  (1.651 m)   Wt 278 lb (126.1 kg)   BMI 46.26 kg/m  No LMP recorded. Patient has had a hysterectomy. Psych:  Alert and cooperative. Normal mood and affect. General:   Alert,  Well-developed, well-nourished, pleasant and cooperative in NAD Head:  Normocephalic and atraumatic. Eyes:  Sclera clear, no icterus.   Conjunctiva pink. Ears:  Normal auditory acuity. Nose:  No deformity, discharge, or lesions. Mouth:  No deformity or lesions,oropharynx pink & moist. Neck:  Supple; no masses or thyromegaly. Abdomen:  Normal bowel sounds.  No bruits.  Soft, non-tender and non-distended without masses, hepatosplenomegaly or hernias noted.  No guarding or rebound tenderness.    Msk:  Symmetrical without gross deformities. Good, equal movement & strength bilaterally. Pulses:  Normal pulses noted. Extremities:  No clubbing or edema.  No cyanosis. Neurologic:  Alert and oriented x3;  grossly normal neurologically. Skin:  Intact without significant lesions or rashes.  No jaundice. Lymph Nodes:  No significant cervical adenopathy. Psych:  Alert and cooperative. Normal mood and affect.   Labs: CBC    Component Value Date/Time   WBC 8.0 10/14/2020 1109   RBC 4.48 10/14/2020 1109   HGB 14.0 10/14/2020 1109   HCT 41.5 10/14/2020 1109   PLT 353 10/14/2020 1109   MCV 93 10/14/2020 1109   MCH 31.3 10/14/2020 1109   MCHC 33.7 10/14/2020 1109   RDW 12.8 10/14/2020 1109   LYMPHSABS 2.5 03/21/2019 1516   EOSABS 0.2 03/21/2019 1516   BASOSABS 0.1 03/21/2019 1516   CMP  Component Value Date/Time   NA 144 10/14/2020 1109   K 4.8 10/14/2020 1109   CL 101 10/14/2020 1109   CO2 26 10/14/2020 1109   GLUCOSE 112 (H) 10/14/2020 1109   BUN 11 10/14/2020 1109   CREATININE 0.71 10/14/2020 1109   CALCIUM 9.8 10/14/2020 1109   PROT 7.1 11/25/2020 1501   ALBUMIN 4.4 11/25/2020 1501   AST 47 (H) 11/25/2020 1501   ALT 44 (H) 11/25/2020 1501   ALKPHOS 131 (H) 11/25/2020 1501   BILITOT 0.4 11/25/2020 1501   GFRNONAA 96 10/14/2020 1109   GFRAA 110 10/14/2020 1109    Imaging Studies: ECHOCARDIOGRAM COMPLETE  Result Date: 11/14/2020    ECHOCARDIOGRAM REPORT   Patient Name:   TASHA DIAZ Date of Exam: 11/14/2020 Medical Rec #:  [id number]       Height:       65.0 in Accession #:    [id number]      Weight:       276.6 lb Date of Birth:  1966/04/23      BSA:          2.270 m Patient Age:    28 years        BP:           140/92 mmHg Patient Gender: F               HR:           71 bpm. Exam Location:  [facility] Procedure: 2D Echo, Cardiac Doppler and Color Doppler Indications:    R06.00 Dyspnea  History:        Patient has no prior history of Echocardiogram examinations.                 Signs/Symptoms:Chest Pain; Risk Factors:Dyslipidemia.                 Palpitations. Fibromyalgia. Hypothyroidism. Asthma.  Sonographer:    Diamond Nickel RCS Referring Phys: [id number] [place]  1. Left ventricular ejection fraction, by estimation, is 60 to  65%. The left ventricle has normal function. The left ventricle has no regional wall motion abnormalities. Left ventricular diastolic parameters were normal.  2. Right ventricular systolic function is normal. The right ventricular size is normal.  3. The mitral valve is normal in structure. No evidence of mitral valve regurgitation.  4. The aortic valve is normal in structure. Aortic valve regurgitation is not visualized. No aortic stenosis is present. Comparison(s): No prior Echocardiogram. Conclusion(s)/Recommendation(s): Normal biventricular function without evidence of hemodynamically significant valvular heart disease. FINDINGS  Left Ventricle: Left ventricular ejection fraction, by estimation, is 60 to 65%. The left ventricle has normal function. The left ventricle has no regional wall motion abnormalities. The left ventricular internal cavity size was normal in size. There is  no left ventricular hypertrophy. Left ventricular diastolic parameters were normal. Right Ventricle: The right ventricular size is normal. No increase in right ventricular wall thickness. Right ventricular systolic function is normal. Left Atrium: Left atrial size was normal in size. Right Atrium: Right atrial size was normal in size. Pericardium: Trivial pericardial effusion is present. Mitral Valve: The mitral valve is normal in structure. No evidence of mitral valve regurgitation. Tricuspid Valve: The tricuspid valve is normal in structure. Tricuspid valve regurgitation is not demonstrated. Aortic Valve: The aortic valve is normal in structure. Aortic valve regurgitation is not visualized. No aortic stenosis is present. Pulmonic Valve: The pulmonic valve was grossly normal. Pulmonic valve regurgitation is  not visualized. Aorta: The aortic root, ascending aorta and aortic arch are all structurally normal, with no evidence of dilitation or obstruction. Venous: The inferior vena cava was not well visualized. IAS/Shunts: The atrial  septum is grossly normal.  LEFT VENTRICLE PLAX 2D LVIDd:         5.20 cm  Diastology LVIDs:         3.00 cm  LV e' medial:    7.62 cm/s LV PW:         0.90 cm  LV E/e' medial:  13.1 LV IVS:        0.80 cm  LV e' lateral:   12.20 cm/s LVOT diam:     1.90 cm  LV E/e' lateral: 8.2 LV SV:         83 LV SV Index:   37 LVOT Area:     2.84 cm  RIGHT VENTRICLE RV Basal diam:  1.90 cm RV S prime:     15.10 cm/s TAPSE (M-mode): 2.2 cm LEFT ATRIUM             Index       RIGHT ATRIUM          Index LA diam:        3.00 cm 1.32 cm/m  RA Area:     9.64 cm LA Vol (A2C):   48.5 ml 21.36 ml/m RA Volume:   18.00 ml 7.93 ml/m LA Vol (A4C):   37.1 ml 16.34 ml/m LA Biplane Vol: 43.5 ml 19.16 ml/m  AORTIC VALVE LVOT Vmax:   146.00 cm/s LVOT Vmean:  79.600 cm/s LVOT VTI:    0.293 m  AORTA Ao Root diam: 3.00 cm MITRAL VALVE MV Area (PHT): 4.12 cm     SHUNTS MV Decel Time: 184 msec     Systemic VTI:  0.29 m MV E velocity: 100.00 cm/s  Systemic Diam: 1.90 cm MV A velocity: 87.00 cm/s MV E/A ratio:  1.15 Rudean Haskell MD Electronically signed by Rudean Haskell MD Signature Date/Time: 11/14/2020/10:01:07 AM    Final     Assessment and Plan:   Pamela Esparza is a 55 y.o. y/o female has been referred for abdominal pain  Repeat labs today in clinic shows elevated transaminases and persistent mild elevation in alk phos, although improved from before  GGT is normal, although high normal.  Normal bilirubin suggests against biliary obstruction.  Patient has history of cholecystectomy and also reports previous history of ERCP years ago after cholecystectomy for stone removal  I will obtain right upper quadrant ultrasound  If right upper quadrant ultrasound negative, proceed with EGD for evaluation of abdominal pain  Avoid hepatotoxic drugs  I will order further work-up for elevated liver enzymes as well at this time    Dr [name]  Speech recognition software was used to dictate the above note.

## 2020-11-26 NOTE — Addendum Note (Signed)
Addended by: [name] on: 11/26/2020 01:28 PM   Modules accepted: Orders

## 2020-11-27 ENCOUNTER — Telehealth (HOSPITAL_COMMUNITY): Payer: Self-pay | Admitting: *Deleted

## 2020-11-27 NOTE — Telephone Encounter (Signed)
Reaching out to patient to offer assistance regarding upcoming cardiac imaging study; pt verbalizes understanding of appt date/time, parking situation and where to check in, pre-test NPO status and medications ordered, and verified current allergies; name and call back number provided for further questions should they arise  Soumya Colson RN Navigator Cardiac Imaging [facility] [phone] office [phone] cell  

## 2020-11-28 ENCOUNTER — Ambulatory Visit (HOSPITAL_COMMUNITY)
Admission: RE | Admit: 2020-11-28 | Discharge: 2020-11-28 | Disposition: A | Discharge status: Home / Self Care | Payer: Federal, State, Local not specified - PPO | Source: Ambulatory Visit | Attending: Cardiology | Admitting: Cardiology

## 2020-11-28 ENCOUNTER — Other Ambulatory Visit: Payer: Self-pay

## 2020-11-28 DIAGNOSIS — R0602 Shortness of breath: Secondary | ICD-10-CM | POA: Diagnosis not present

## 2020-11-28 DIAGNOSIS — I700 Atherosclerosis of aorta: Secondary | ICD-10-CM | POA: Insufficient documentation

## 2020-11-28 DIAGNOSIS — K760 Fatty (change of) liver, not elsewhere classified: Secondary | ICD-10-CM | POA: Diagnosis not present

## 2020-11-28 DIAGNOSIS — R079 Chest pain, unspecified: Secondary | ICD-10-CM | POA: Diagnosis not present

## 2020-11-28 MED ORDER — NITROGLYCERIN 0.4 MG SL SUBL: 0.8000 mg | SUBLINGUAL_TABLET | SUBLINGUAL | Status: DC | PRN

## 2020-11-28 MED ORDER — IOHEXOL 350 MG/ML SOLN
90.0000 mL | Freq: Once | INTRAVENOUS | Status: AC | PRN
  Administered 2020-11-28: 90 mL via INTRAVENOUS

## 2020-11-28 MED ORDER — NITROGLYCERIN 0.4 MG SL SUBL: 0.8000 mg | SUBLINGUAL_TABLET | Freq: Once | SUBLINGUAL | Status: AC

## 2020-11-28 MED ORDER — NITROGLYCERIN 0.4 MG SL SUBL
SUBLINGUAL_TABLET | SUBLINGUAL | Status: AC
  Administered 2020-11-28: 0.8 mg via SUBLINGUAL
  Filled 2020-11-28: qty 2

## 2020-11-28 MED ORDER — NITROGLYCERIN 0.4 MG SL SUBL: 0.4000 mg | SUBLINGUAL_TABLET | SUBLINGUAL | Status: DC | PRN

## 2020-12-02 ENCOUNTER — Other Ambulatory Visit: Payer: Self-pay

## 2020-12-02 ENCOUNTER — Ambulatory Visit
Admission: RE | Admit: 2020-12-02 | Discharge: 2020-12-02 | Disposition: A | Discharge status: Home / Self Care | Payer: Federal, State, Local not specified - PPO | Source: Ambulatory Visit | Attending: Gastroenterology | Admitting: Gastroenterology

## 2020-12-02 DIAGNOSIS — R945 Abnormal results of liver function studies: Secondary | ICD-10-CM | POA: Diagnosis not present

## 2020-12-02 DIAGNOSIS — R748 Abnormal levels of other serum enzymes: Secondary | ICD-10-CM | POA: Diagnosis not present

## 2020-12-02 DIAGNOSIS — R109 Unspecified abdominal pain: Secondary | ICD-10-CM | POA: Diagnosis not present

## 2020-12-03 ENCOUNTER — Other Ambulatory Visit
Admission: RE | Admit: 2020-12-03 | Discharge: 2020-12-03 | Disposition: A | Discharge status: Home / Self Care | Payer: Federal, State, Local not specified - PPO | Source: Ambulatory Visit | Attending: Gastroenterology | Admitting: Gastroenterology

## 2020-12-03 DIAGNOSIS — Z888 Allergy status to other drugs, medicaments and biological substances status: Secondary | ICD-10-CM | POA: Diagnosis not present

## 2020-12-03 DIAGNOSIS — Z79899 Other long term (current) drug therapy: Secondary | ICD-10-CM | POA: Diagnosis not present

## 2020-12-03 DIAGNOSIS — K3189 Other diseases of stomach and duodenum: Secondary | ICD-10-CM | POA: Diagnosis not present

## 2020-12-03 DIAGNOSIS — Z01812 Encounter for preprocedural laboratory examination: Secondary | ICD-10-CM | POA: Insufficient documentation

## 2020-12-03 DIAGNOSIS — Z20822 Contact with and (suspected) exposure to covid-19: Secondary | ICD-10-CM | POA: Insufficient documentation

## 2020-12-03 DIAGNOSIS — Z885 Allergy status to narcotic agent status: Secondary | ICD-10-CM | POA: Diagnosis not present

## 2020-12-03 DIAGNOSIS — Z886 Allergy status to analgesic agent status: Secondary | ICD-10-CM | POA: Diagnosis not present

## 2020-12-03 DIAGNOSIS — R109 Unspecified abdominal pain: Secondary | ICD-10-CM | POA: Diagnosis not present

## 2020-12-03 DIAGNOSIS — Z9049 Acquired absence of other specified parts of digestive tract: Secondary | ICD-10-CM | POA: Diagnosis not present

## 2020-12-03 DIAGNOSIS — K2980 Duodenitis without bleeding: Secondary | ICD-10-CM | POA: Diagnosis not present

## 2020-12-03 DIAGNOSIS — Z79890 Hormone replacement therapy (postmenopausal): Secondary | ICD-10-CM | POA: Diagnosis not present

## 2020-12-03 DIAGNOSIS — R1310 Dysphagia, unspecified: Secondary | ICD-10-CM | POA: Diagnosis not present

## 2020-12-03 LAB — SARS CORONAVIRUS 2 (TAT 6-24 HRS): SARS Coronavirus 2: NEGATIVE

## 2020-12-05 ENCOUNTER — Ambulatory Visit: Payer: Federal, State, Local not specified - PPO | Admitting: Certified Registered Nurse Anesthetist

## 2020-12-05 ENCOUNTER — Ambulatory Visit
Admission: RE | Admit: 2020-12-05 | Discharge: 2020-12-05 | Disposition: A | Discharge status: Home / Self Care | Payer: Federal, State, Local not specified - PPO | Attending: Gastroenterology | Admitting: Gastroenterology

## 2020-12-05 ENCOUNTER — Encounter: Payer: Self-pay | Admitting: Gastroenterology

## 2020-12-05 ENCOUNTER — Encounter
Admission: RE | Disposition: A | Discharge status: Home / Self Care | Payer: Self-pay | Source: Home / Self Care | Attending: Gastroenterology

## 2020-12-05 ENCOUNTER — Other Ambulatory Visit: Payer: Self-pay

## 2020-12-05 DIAGNOSIS — M797 Fibromyalgia: Secondary | ICD-10-CM | POA: Diagnosis not present

## 2020-12-05 DIAGNOSIS — K2980 Duodenitis without bleeding: Secondary | ICD-10-CM | POA: Insufficient documentation

## 2020-12-05 DIAGNOSIS — R109 Unspecified abdominal pain: Secondary | ICD-10-CM | POA: Insufficient documentation

## 2020-12-05 DIAGNOSIS — Z885 Allergy status to narcotic agent status: Secondary | ICD-10-CM | POA: Diagnosis not present

## 2020-12-05 DIAGNOSIS — K3189 Other diseases of stomach and duodenum: Secondary | ICD-10-CM | POA: Insufficient documentation

## 2020-12-05 DIAGNOSIS — Z20822 Contact with and (suspected) exposure to covid-19: Secondary | ICD-10-CM | POA: Insufficient documentation

## 2020-12-05 DIAGNOSIS — Z9049 Acquired absence of other specified parts of digestive tract: Secondary | ICD-10-CM | POA: Insufficient documentation

## 2020-12-05 DIAGNOSIS — Z79899 Other long term (current) drug therapy: Secondary | ICD-10-CM | POA: Insufficient documentation

## 2020-12-05 DIAGNOSIS — R1310 Dysphagia, unspecified: Secondary | ICD-10-CM | POA: Insufficient documentation

## 2020-12-05 DIAGNOSIS — E039 Hypothyroidism, unspecified: Secondary | ICD-10-CM | POA: Diagnosis not present

## 2020-12-05 DIAGNOSIS — Z886 Allergy status to analgesic agent status: Secondary | ICD-10-CM | POA: Insufficient documentation

## 2020-12-05 DIAGNOSIS — Z888 Allergy status to other drugs, medicaments and biological substances status: Secondary | ICD-10-CM | POA: Diagnosis not present

## 2020-12-05 DIAGNOSIS — Z79890 Hormone replacement therapy (postmenopausal): Secondary | ICD-10-CM | POA: Diagnosis not present

## 2020-12-05 DIAGNOSIS — J45909 Unspecified asthma, uncomplicated: Secondary | ICD-10-CM | POA: Diagnosis not present

## 2020-12-05 DIAGNOSIS — R1084 Generalized abdominal pain: Secondary | ICD-10-CM | POA: Diagnosis not present

## 2020-12-05 DIAGNOSIS — R748 Abnormal levels of other serum enzymes: Secondary | ICD-10-CM

## 2020-12-05 DIAGNOSIS — R945 Abnormal results of liver function studies: Secondary | ICD-10-CM | POA: Diagnosis not present

## 2020-12-05 HISTORY — PX: ESOPHAGOGASTRODUODENOSCOPY (EGD) WITH PROPOFOL: SHX5813

## 2020-12-05 SURGERY — ESOPHAGOGASTRODUODENOSCOPY (EGD) WITH PROPOFOL
Anesthesia: Regional

## 2020-12-05 MED ORDER — SODIUM CHLORIDE 0.9 % IV SOLN: INTRAVENOUS | Status: DC

## 2020-12-05 MED ORDER — MIDAZOLAM HCL 2 MG/2ML IJ SOLN
INTRAMUSCULAR | Status: AC
  Filled 2020-12-05: qty 2

## 2020-12-05 MED ORDER — PROPOFOL 10 MG/ML IV BOLUS
INTRAVENOUS | Status: DC | PRN
  Administered 2020-12-05: 70 mg via INTRAVENOUS
  Administered 2020-12-05: 30 mg via INTRAVENOUS
  Administered 2020-12-05: 40 mg via INTRAVENOUS

## 2020-12-05 MED ORDER — PROPOFOL 10 MG/ML IV BOLUS
INTRAVENOUS | Status: AC
  Filled 2020-12-05: qty 20

## 2020-12-05 MED ORDER — LIDOCAINE HCL (CARDIAC) PF 100 MG/5ML IV SOSY
PREFILLED_SYRINGE | INTRAVENOUS | Status: DC | PRN
  Administered 2020-12-05: 50 mg via INTRAVENOUS

## 2020-12-05 MED ORDER — PROPOFOL 500 MG/50ML IV EMUL
INTRAVENOUS | Status: AC
  Filled 2020-12-05: qty 50

## 2020-12-05 MED ORDER — DEXMEDETOMIDINE (PRECEDEX) IN NS 20 MCG/5ML (4 MCG/ML) IV SYRINGE
PREFILLED_SYRINGE | INTRAVENOUS | Status: AC
  Filled 2020-12-05: qty 5

## 2020-12-05 MED ORDER — DEXMEDETOMIDINE HCL 200 MCG/2ML IV SOLN
INTRAVENOUS | Status: DC | PRN
  Administered 2020-12-05: 8 ug via INTRAVENOUS
  Administered 2020-12-05: 4 ug via INTRAVENOUS

## 2020-12-05 MED ORDER — PROPOFOL 500 MG/50ML IV EMUL
INTRAVENOUS | Status: DC | PRN
  Administered 2020-12-05: 180 ug/kg/min via INTRAVENOUS

## 2020-12-05 MED ORDER — LIDOCAINE HCL (PF) 2 % IJ SOLN
INTRAMUSCULAR | Status: AC
  Filled 2020-12-05: qty 5

## 2020-12-05 NOTE — H&P (Signed)
Pamela Antigua, MD [address] [place], [place], [place], [address] [address], [place], [place], [place], [address] Phone: [phone]  Fax: [phone]  Primary Care Physician:  Lorrene Reid, PA-C   Pre-Procedure History & Physical: HPI:  Pamela Esparza is a 55 y.o. female is here for an EGD.   Past Medical History:  Diagnosis Date  . Anxiety   . Arthritis   . Asthma   . Cataract   . DDD (degenerative disc disease), lumbar   . Depression   . Depression    Phreesia 10/13/2020  . Fibromyalgia   . Hyperlipidemia   . Joint pain   . Neuromuscular disorder ([place])    Phreesia 10/13/2020  . Neuropathy   . Sjogren's disease ([facility])   . Thyroid disease     Past Surgical History:  Procedure Laterality Date  . ABDOMINAL HYSTERECTOMY    . APPENDECTOMY    . CHOLECYSTECTOMY    . finger sugery    . NECK SURGERY    . TENDON TRANSFER Right 11/06/2019   Procedure: PERONEAL RECONSTRUCTION WITH PERONEAL LONGUS RIGHT LEG;  Surgeon: Newt Minion, MD;  Location: [facility];  Service: Orthopedics;  Laterality: Right;  . TUBAL LIGATION N/A    Phreesia 10/13/2020    Prior to Admission medications   Medication Sig Start Date End Date Taking? Authorizing Provider  atorvastatin (LIPITOR) 10 MG tablet Take 1 tablet (10 mg total) by mouth daily. 10/14/20  Yes Abonza, Maritza, PA-C  baclofen (LIORESAL) 10 MG tablet Take 10 mg by mouth 3 (three) times daily as needed. 09/29/18  Yes [provider]  Cholecalciferol (VITAMIN D3) 75 MCG (3000 UT) TABS Take 2,000 Int'l Units by mouth daily.   Yes [provider]  DULoxetine (CYMBALTA) 60 MG capsule Take 120 mg by mouth daily. 12/25/15  Yes [provider]  fluticasone (FLONASE) 50 MCG/ACT nasal spray SHAKE LIQUID AND USE 1 SPRAY IN EACH NOSTRIL DAILY 03/21/19  Yes Danford, Katy D, NP  gabapentin (NEURONTIN) 600 MG tablet Take 1 tablet (600 mg total) by mouth 3 (three) times daily. 10/14/20  Yes  Lorrene Reid, PA-C  levothyroxine (SYNTHROID) 50 MCG tablet Take 1 tablet (50 mcg total) by mouth daily before breakfast. 10/14/20  Yes Abonza, Maritza, PA-C  metoprolol tartrate (LOPRESSOR) 50 MG tablet Take 1 tablet (50 mg) 1-2 hours prior to your cardiac CT. Bring the second tablet with you to your appointment but don't take unless instructed. 10/28/20  Yes Freada Bergeron, MD  Multiple Vitamins-Minerals (CENTRUM SILVER PO) Take by mouth.   Yes [provider]  oxyCODONE-acetaminophen (PERCOCET) 10-325 MG tablet Take 1 tablet by mouth 2 (two) times daily. 09/18/18  Yes [provider]  pilocarpine (SALAGEN) 5 MG tablet Take 5 mg by mouth 3 (three) times daily. 12/11/15  Yes [provider]  albuterol (VENTOLIN HFA) 108 (90 Base) MCG/ACT inhaler INHALE 1 TO 2 PUFFS INTO THE LUNGS EVERY 6 HOURS AS NEEDED 03/14/20   Abonza, Maritza, PA-C  ALPRAZolam (XANAX) 0.5 MG tablet Take 0.5 mg by mouth daily as needed.    [provider]  meloxicam (MOBIC) 15 MG tablet Take 15 mg by mouth. 11/11/15   [provider]  nitroGLYCERIN (NITROSTAT) 0.4 MG SL tablet Place under the tongue.    [provider]    Allergies as of 11/26/2020 - Review Complete 11/25/2020  Allergen Reaction Noted  . Aspirin Other (See Comments) 03/12/2015  . Hydrocodone-acetaminophen Other (See Comments) 10/23/2018  .  Lyrica [pregabalin]  10/23/2018  . Topiramate  06/25/2016    Family History  Problem Relation Age of Onset  . Cancer Mother   . Heart attack Father   . Hypertension Father   . Diabetes Father   . Birth defects Maternal Grandmother        ovarian  . Depression Maternal Grandmother   . Birth defects Paternal Grandmother        brain  . Colon cancer Neg Hx   . Colon polyps Neg Hx   . Esophageal cancer Neg Hx   . Stomach cancer Neg Hx   . Rectal cancer Neg Hx     Social History   Socioeconomic History  . Marital status: Married    Spouse name: Not on  file  . Number of children: Not on file  . Years of education: Not on file  . Highest education level: Not on file  Occupational History  . Not on file  Tobacco Use  . Smoking status: Never Smoker  . Smokeless tobacco: Never Used  Vaping Use  . Vaping Use: Never used  Substance and Sexual Activity  . Alcohol use: Yes    Comment: 4 drinks a year-rare  . Drug use: Never  . Sexual activity: Not Currently  Other Topics Concern  . Not on file  Social History Narrative  . Not on file   Social Determinants of Health   Financial Resource Strain: Not on file  Food Insecurity: Not on file  Transportation Needs: Not on file  Physical Activity: Not on file  Stress: Not on file  Social Connections: Not on file  Intimate Partner Violence: Not on file    Review of Systems: See HPI, otherwise negative ROS  Physical Exam: BP (!) 143/80   Pulse 64   Temp (!) 96 F (35.6 C) (Temporal)   Ht 5\' 5"  (1.651 m)   Wt 124.7 kg   SpO2 99%   BMI 45.76 kg/m  General:   Alert,  pleasant and cooperative in NAD Head:  Normocephalic and atraumatic. Neck:  Supple; no masses or thyromegaly. Lungs:  Clear throughout to auscultation, normal respiratory effort.    Heart:  +S1, +S2, Regular rate and rhythm, No edema. Abdomen:  Soft, nontender and nondistended. Normal bowel sounds, without guarding, and without rebound.   Neurologic:  Alert and  oriented x4;  grossly normal neurologically.  Impression/Plan: Pamela Esparza is here for an EGD for abdominal pain  Risks, benefits, limitations, and alternatives regarding the procedure have been reviewed with the patient.  Questions have been answered.  All parties agreeable.   Virgel Manifold, MD  12/05/2020, 9:28 AM

## 2020-12-05 NOTE — Anesthesia Postprocedure Evaluation (Signed)
Anesthesia Post Note  Patient: Pamela Esparza  Procedure(s) Performed: ESOPHAGOGASTRODUODENOSCOPY (EGD) WITH PROPOFOL (N/A )  Patient location during evaluation: Endoscopy Anesthesia Type: Regional Level of consciousness: awake and alert Pain management: pain level controlled Vital Signs Assessment: post-procedure vital signs reviewed and stable Respiratory status: spontaneous breathing and respiratory function stable Cardiovascular status: stable Anesthetic complications: no   No complications documented.   Last Vitals:  Vitals:   12/05/20 1000 12/05/20 1010  BP: 109/77 119/73  Pulse: 69 (!) 58  Resp: 13 13  Temp:    SpO2: 100% 100%    Last Pain:  Vitals:   12/05/20 1010  TempSrc:   PainSc: 0-No pain                 Brach Birdsall K

## 2020-12-05 NOTE — Transfer of Care (Signed)
Immediate Anesthesia Transfer of Care Note  Patient: Pamela Esparza  Procedure(s) Performed: ESOPHAGOGASTRODUODENOSCOPY (EGD) WITH PROPOFOL (N/A )  Patient Location: Endoscopy Unit  Anesthesia Type:General  Level of Consciousness: drowsy  Airway & Oxygen Therapy: Patient Spontanous Breathing and Patient connected to face mask oxygen  Post-op Assessment: Report given to RN and Post -op Vital signs reviewed and stable  Post vital signs: Reviewed and stable  Last Vitals:  Vitals Value Taken Time  BP 137/90 12/05/20 0950  Temp 35.6 C 12/05/20 0950  Pulse 73 12/05/20 0951  Resp 15 12/05/20 0951  SpO2 97 % 12/05/20 0951  Vitals shown include unvalidated device data.  Last Pain:  Vitals:   12/05/20 0950  TempSrc: Temporal  PainSc: 0-No pain         Complications: No complications documented.

## 2020-12-05 NOTE — Op Note (Signed)
[facility] Gastroenterology Patient Name: Pamela Esparza Procedure Date: 12/05/2020 9:31 AM MRN: [id number] Account #: [id number] Date of Birth: [birth year] Admit Type: Outpatient Age: 55 Room: [facility] ENDO ROOM 1 Gender: Female Note Status: Finalized Procedure:             Upper GI endoscopy Indications:           Abdominal pain, Dysphagia Providers:             Jaid Quirion B. Bonna Gains MD, MD Referring MD:          Lorrene Reid (Referring MD) Medicines:             Monitored Anesthesia Care Complications:         No immediate complications. Procedure:             Pre-Anesthesia Assessment:                        - Prior to the procedure, a History and Physical was                         performed, and patient medications, allergies and                         sensitivities were reviewed. The patient's tolerance                         of previous anesthesia was reviewed.                        - The risks and benefits of the procedure and the                         sedation options and risks were discussed with the                         patient. All questions were answered and informed                         consent was obtained.                        - Patient identification and proposed procedure were                         verified prior to the procedure by the physician, the                         nurse, the anesthesiologist, the anesthetist and the                         technician. The procedure was verified in the                         procedure room.                        - ASA Grade Assessment: II - A patient with mild                         systemic disease.  After obtaining informed consent, the endoscope was                         passed under direct vision. Throughout the procedure,                         the patient's blood pressure, pulse, and oxygen                         saturations were monitored continuously.  The Endoscope                         was introduced through the mouth, and advanced to the                         second part of duodenum. The upper GI endoscopy was                         accomplished with ease. The patient tolerated the                         procedure well. Findings:      The examined esophagus was normal. Biopsies were obtained from the       proximal and distal esophagus with cold forceps for histology of       suspected eosinophilic esophagitis.      The entire examined stomach was normal. Biopsies were obtained in the       gastric body, at the incisura and in the gastric antrum with cold       forceps for histology. Biopsies were taken with a cold forceps for       Helicobacter pylori testing.      Patchy mild mucosal changes characterized by thickened folds were found       in the duodenal bulb. Biopsies were taken with a cold forceps for       histology.      The exam of the duodenum was otherwise normal. Impression:            - Normal esophagus. Biopsied.                        - Normal stomach. Biopsied.                        - Mucosal changes in the duodenum. Biopsied.                        - Biopsies were obtained in the gastric body, at the                         incisura and in the gastric antrum. Recommendation:        - Await pathology results.                        - Discharge patient to home (with escort).                        - Advance diet as tolerated.                        -  Continue present medications.                        - Patient has a contact number available for                         emergencies. The signs and symptoms of potential                         delayed complications were discussed with the patient.                         Return to normal activities tomorrow. Written                         discharge instructions were provided to the patient.                        - Discharge patient to home (with escort).                         - The findings and recommendations were discussed with                         the patient.                        - The findings and recommendations were discussed with                         the patient's family. Procedure Code(s):     --- Professional ---                        [phone], Esophagogastroduodenoscopy, flexible,                         transoral; with biopsy, single or multiple Diagnosis Code(s):     --- Professional ---                        R10.9, Unspecified abdominal pain                        R13.10, Dysphagia, unspecified CPT copyright 2019 American Medical Association. All rights reserved. The codes documented in this report are preliminary and upon coder review may  be revised to meet current compliance requirements.  [name], MD Margretta Sidle B. Bonna Gains MD, MD 12/05/2020 9:58:53 AM This report has been signed electronically. Number of Addenda: 0 Note Initiated On: 12/05/2020 9:31 AM Estimated Blood Loss:  Estimated blood loss: none.      [facility]

## 2020-12-05 NOTE — Anesthesia Preprocedure Evaluation (Signed)
Anesthesia Evaluation  Patient identified by MRN, date of birth, ID band Patient awake    Reviewed: Allergy & Precautions, NPO status , Patient's Chart, lab work & pertinent test results  Airway Mallampati: II  TM Distance: >3 FB Neck ROM: Full    Dental  (+) Loose,    Pulmonary asthma ,    Pulmonary exam normal breath sounds clear to auscultation       Cardiovascular negative cardio ROS Normal cardiovascular exam Rhythm:Regular Rate:Normal     Neuro/Psych PSYCHIATRIC DISORDERS Anxiety Depression negative neurological ROS     GI/Hepatic negative GI ROS, Neg liver ROS,   Endo/Other  Hypothyroidism Morbid obesity  Renal/GU negative Renal ROS     Musculoskeletal  (+) Fibromyalgia -  Abdominal (+) + obese,   Peds  Hematology HLD   Anesthesia Other Findings Tear Peroneus Brevis Right Leg  Reproductive/Obstetrics                             Anesthesia Physical  Anesthesia Plan  ASA: III  Anesthesia Plan: General and Regional   Post-op Pain Management:  Regional for Post-op pain   Induction: Intravenous  PONV Risk Score and Plan:   Airway Management Planned: Nasal Cannula  Additional Equipment:   Intra-op Plan:   Post-operative Plan:   Informed Consent: I have reviewed the patients History and Physical, chart, labs and discussed the procedure including the risks, benefits and alternatives for the proposed anesthesia with the patient or authorized representative who has indicated his/her understanding and acceptance.     Dental advisory given  Plan Discussed with: CRNA  Anesthesia Plan Comments:         Anesthesia Quick Evaluation

## 2020-12-08 ENCOUNTER — Telehealth: Payer: Self-pay | Admitting: Gastroenterology

## 2020-12-08 LAB — SURGICAL PATHOLOGY

## 2020-12-08 NOTE — Telephone Encounter (Signed)
Patient LVM and stated that she is having chest pains when eating  following her EGD  on 12/05/20.  Please call to advise

## 2020-12-08 NOTE — Telephone Encounter (Signed)
Spoke with CMA's, called patient back and instructed her to go to ED or PCP if she is experiencing chest pains.

## 2020-12-09 NOTE — Telephone Encounter (Signed)
Called patient and had to leave her a voicemail wanting to know if she was feeling any better.

## 2020-12-11 DIAGNOSIS — M961 Postlaminectomy syndrome, not elsewhere classified: Secondary | ICD-10-CM | POA: Diagnosis not present

## 2020-12-11 DIAGNOSIS — G588 Other specified mononeuropathies: Secondary | ICD-10-CM | POA: Diagnosis not present

## 2020-12-11 DIAGNOSIS — M5030 Other cervical disc degeneration, unspecified cervical region: Secondary | ICD-10-CM | POA: Diagnosis not present

## 2020-12-11 DIAGNOSIS — M47812 Spondylosis without myelopathy or radiculopathy, cervical region: Secondary | ICD-10-CM | POA: Diagnosis not present

## 2020-12-11 DIAGNOSIS — M170 Bilateral primary osteoarthritis of knee: Secondary | ICD-10-CM | POA: Diagnosis not present

## 2020-12-11 DIAGNOSIS — G8929 Other chronic pain: Secondary | ICD-10-CM | POA: Diagnosis not present

## 2020-12-11 DIAGNOSIS — M7918 Myalgia, other site: Secondary | ICD-10-CM | POA: Diagnosis not present

## 2020-12-11 DIAGNOSIS — M533 Sacrococcygeal disorders, not elsewhere classified: Secondary | ICD-10-CM | POA: Diagnosis not present

## 2020-12-11 DIAGNOSIS — Z79891 Long term (current) use of opiate analgesic: Secondary | ICD-10-CM | POA: Diagnosis not present

## 2020-12-11 DIAGNOSIS — M1712 Unilateral primary osteoarthritis, left knee: Secondary | ICD-10-CM | POA: Diagnosis not present

## 2020-12-11 DIAGNOSIS — M545 Low back pain, unspecified: Secondary | ICD-10-CM | POA: Diagnosis not present

## 2020-12-11 DIAGNOSIS — M461 Sacroiliitis, not elsewhere classified: Secondary | ICD-10-CM | POA: Diagnosis not present

## 2020-12-11 DIAGNOSIS — M47816 Spondylosis without myelopathy or radiculopathy, lumbar region: Secondary | ICD-10-CM | POA: Diagnosis not present

## 2020-12-11 DIAGNOSIS — M4802 Spinal stenosis, cervical region: Secondary | ICD-10-CM | POA: Diagnosis not present

## 2020-12-11 DIAGNOSIS — M5136 Other intervertebral disc degeneration, lumbar region: Secondary | ICD-10-CM | POA: Diagnosis not present

## 2020-12-11 DIAGNOSIS — M25562 Pain in left knee: Secondary | ICD-10-CM | POA: Diagnosis not present

## 2020-12-15 NOTE — Telephone Encounter (Signed)
Error

## 2020-12-18 ENCOUNTER — Telehealth: Payer: Self-pay

## 2020-12-18 NOTE — Telephone Encounter (Signed)
Patient verbalized understanding of results. She states she will have the lab work done next week

## 2020-12-18 NOTE — Telephone Encounter (Signed)
-----   Message from Virgel Manifold, MD sent at 11/26/2020  1:31 PM EST ----- Herb Grays please let the patient know, her liver enzymes are elevated.  I have ordered further blood work.  We await her ultrasound results as well

## 2020-12-20 ENCOUNTER — Other Ambulatory Visit: Payer: Self-pay | Admitting: Physician Assistant

## 2020-12-20 DIAGNOSIS — J069 Acute upper respiratory infection, unspecified: Secondary | ICD-10-CM

## 2020-12-24 DIAGNOSIS — R748 Abnormal levels of other serum enzymes: Secondary | ICD-10-CM | POA: Diagnosis not present

## 2020-12-25 ENCOUNTER — Encounter: Payer: Self-pay | Admitting: Gastroenterology

## 2020-12-27 LAB — IGG: IgG (Immunoglobin G), Serum: 1230 mg/dL (ref 586–1602)

## 2020-12-27 LAB — HEPATITIS B SURFACE ANTIGEN: Hepatitis B Surface Ag: NEGATIVE

## 2020-12-27 LAB — HEPATITIS B CORE ANTIBODY, TOTAL: Hep B Core Total Ab: NEGATIVE

## 2020-12-27 LAB — ANA: ANA Titer 1: NEGATIVE

## 2020-12-27 LAB — FERRITIN: Ferritin: 119 ng/mL (ref 15–150)

## 2020-12-27 LAB — CERULOPLASMIN: Ceruloplasmin: 31.7 mg/dL (ref 19.0–39.0)

## 2020-12-27 LAB — HEPATITIS C ANTIBODY: Hep C Virus Ab: <0.1 s/co ratio (ref 0.0–0.9)

## 2020-12-27 LAB — ANTI-MICROSOMAL ANTIBODY LIVER / KIDNEY: LKM1 Ab: 0.9 Units (ref 0.0–20.0)

## 2020-12-27 LAB — HEPATITIS B SURFACE ANTIBODY,QUALITATIVE: Hep B Surface Ab, Qual: NONREACTIVE

## 2020-12-27 LAB — HEPATITIS A ANTIBODY, TOTAL: hep A Total Ab: NEGATIVE

## 2020-12-27 LAB — MITOCHONDRIAL/SMOOTH MUSCLE AB PNL
Mitochondrial Ab: <20 Units (ref 0.0–20.0)
Smooth Muscle Ab: 10 Units (ref 0–19)

## 2021-01-05 ENCOUNTER — Telehealth: Payer: Self-pay | Admitting: Physician Assistant

## 2021-01-07 ENCOUNTER — Telehealth (INDEPENDENT_AMBULATORY_CARE_PROVIDER_SITE_OTHER): Payer: Federal, State, Local not specified - PPO | Admitting: Gastroenterology

## 2021-01-07 ENCOUNTER — Other Ambulatory Visit: Payer: Self-pay | Admitting: Physician Assistant

## 2021-01-07 DIAGNOSIS — R1319 Other dysphagia: Secondary | ICD-10-CM | POA: Diagnosis not present

## 2021-01-07 DIAGNOSIS — K760 Fatty (change of) liver, not elsewhere classified: Secondary | ICD-10-CM

## 2021-01-08 ENCOUNTER — Other Ambulatory Visit: Payer: Self-pay | Admitting: Internal Medicine

## 2021-01-08 ENCOUNTER — Other Ambulatory Visit: Payer: Self-pay

## 2021-01-08 ENCOUNTER — Ambulatory Visit (INDEPENDENT_AMBULATORY_CARE_PROVIDER_SITE_OTHER): Payer: Federal, State, Local not specified - PPO | Admitting: Nurse Practitioner

## 2021-01-08 ENCOUNTER — Encounter: Payer: Self-pay | Admitting: Nurse Practitioner

## 2021-01-08 VITALS — BP 113/71 | HR 101 | Temp 98.0°F | Ht 65.0 in | Wt 274.7 lb

## 2021-01-08 DIAGNOSIS — M5442 Lumbago with sciatica, left side: Secondary | ICD-10-CM

## 2021-01-08 DIAGNOSIS — G894 Chronic pain syndrome: Secondary | ICD-10-CM

## 2021-01-08 MED ORDER — PREDNISONE 10 MG (48) PO TBPK: ORAL_TABLET | ORAL | 0 refills | Status: DC

## 2021-01-08 NOTE — Progress Notes (Signed)
Acute Office Visit  Subjective:    Patient ID: Pamela Esparza, female    DOB: 1966-03-21, 55 y.o.   MRN: [id number]  Chief Complaint  Patient presents with  . Back Pain    HPI Patient is in today for acute low back pain with sciatic type nerve pain. She states that pain starts in the center of the lower back. Radiates sharp and shooting, hot pain into the left buttock and down the posterior aspect of the left leg. She states that there are sometimes it radiates to the right hip and upper back also. This acute and severe pain started about two months ago while she was doing some PT exercises for her foot. She states that she does have history of bulging discs in the lumbar spine. She sees a pain management provider in [place], [place]. When she first started having this severe pain, she contacted her pain management provider. He gave her a steroid injection into the lumbar spine. She states that this helped a lot for about 1.5 weeks. Injection was given about 3 weeks ago. She states that once this wore off, the pain came back even more severe than it was in the past. She states that pain is excruciating. States that pain level is higher than 10/10. Pain is interfering with her ability to do several normal activities of daily living. She states that sitting on the toilet severely increases the pain. Standing still for long periods of time also increases the pain. Most comfortable when lying down on the right side. She states that she has been using a heating pad on effected area and applying Bengay. She states that these remedies have not helped. States that prescribed percocets do help some, but when they wear off, pain is excruciating.  She denies abdominal pain, dysuria, urinary frequency, nausea, vomiting, or intestinal cramping.   Past Medical History:  Diagnosis Date  . Anxiety   . Arthritis   . Asthma   . Cataract   . DDD (degenerative disc disease), lumbar   . Depression   . Depression     Phreesia 10/13/2020  . Fibromyalgia   . Hyperlipidemia   . Joint pain   . Neuromuscular disorder ([place])    Phreesia 10/13/2020  . Neuropathy   . Sjogren's disease ([place])   . Thyroid disease     Past Surgical History:  Procedure Laterality Date  . ABDOMINAL HYSTERECTOMY    . APPENDECTOMY    . CHOLECYSTECTOMY    . ESOPHAGOGASTRODUODENOSCOPY (EGD) WITH PROPOFOL N/A 12/05/2020   Procedure: ESOPHAGOGASTRODUODENOSCOPY (EGD) WITH PROPOFOL;  Surgeon: Virgel Manifold, MD;  Location: [facility];  Service: Endoscopy;  Laterality: N/A;  . finger sugery    . NECK SURGERY    . TENDON TRANSFER Right 11/06/2019   Procedure: PERONEAL RECONSTRUCTION WITH PERONEAL LONGUS RIGHT LEG;  Surgeon: Newt Minion, MD;  Location: [facility];  Service: Orthopedics;  Laterality: Right;  . TUBAL LIGATION N/A    Phreesia 10/13/2020    Family History  Problem Relation Age of Onset  . Cancer Mother   . Heart attack Father   . Hypertension Father   . Diabetes Father   . Birth defects Maternal Grandmother        ovarian  . Depression Maternal Grandmother   . Birth defects Paternal Grandmother        brain  . Colon cancer Neg Hx   . Colon polyps Neg Hx   . Esophageal cancer Neg Hx   .  Stomach cancer Neg Hx   . Rectal cancer Neg Hx     Social History   Socioeconomic History  . Marital status: Married    Spouse name: Not on file  . Number of children: Not on file  . Years of education: Not on file  . Highest education level: Not on file  Occupational History  . Not on file  Tobacco Use  . Smoking status: Never Smoker  . Smokeless tobacco: Never Used  Vaping Use  . Vaping Use: Never used  Substance and Sexual Activity  . Alcohol use: Yes    Comment: 4 drinks a year-rare  . Drug use: Never  . Sexual activity: Not Currently  Other Topics Concern  . Not on file  Social History Narrative  . Not on file   Social Determinants of Health   Financial Resource Strain: Not  on file  Food Insecurity: Not on file  Transportation Needs: Not on file  Physical Activity: Not on file  Stress: Not on file  Social Connections: Not on file  Intimate Partner Violence: Not on file    Outpatient Medications Prior to Visit  Medication Sig Dispense Refill  . albuterol (VENTOLIN HFA) 108 (90 Base) MCG/ACT inhaler INHALE 1 TO 2 PUFFS INTO THE LUNGS EVERY 6 HOURS AS NEEDED 54 g 0  . ALPRAZolam (XANAX) 0.5 MG tablet Take 0.5 mg by mouth daily as needed.    Marland Kitchen atorvastatin (LIPITOR) 10 MG tablet Take 1 tablet (10 mg total) by mouth daily. 90 tablet 1  . baclofen (LIORESAL) 10 MG tablet Take 10 mg by mouth 3 (three) times daily as needed.    . Cholecalciferol (VITAMIN D3) 75 MCG (3000 UT) TABS Take 2,000 Int'l Units by mouth daily.    . DULoxetine (CYMBALTA) 60 MG capsule Take 120 mg by mouth daily.    . fluticasone (FLONASE) 50 MCG/ACT nasal spray SHAKE LIQUID AND USE 1 SPRAY IN EACH NOSTRIL DAILY 16 g 3  . levothyroxine (SYNTHROID) 50 MCG tablet Take 1 tablet (50 mcg total) by mouth daily before breakfast. 90 tablet 1  . meloxicam (MOBIC) 15 MG tablet Take 15 mg by mouth.    . metoprolol tartrate (LOPRESSOR) 50 MG tablet Take 1 tablet (50 mg) 1-2 hours prior to your cardiac CT. Bring the second tablet with you to your appointment but don't take unless instructed. 2 tablet 0  . Multiple Vitamins-Minerals (CENTRUM SILVER PO) Take by mouth.    . nitroGLYCERIN (NITROSTAT) 0.4 MG SL tablet Place under the tongue.    Marland Kitchen oxyCODONE-acetaminophen (PERCOCET) 10-325 MG tablet Take 1 tablet by mouth 2 (two) times daily.    . pilocarpine (SALAGEN) 5 MG tablet Take 5 mg by mouth 3 (three) times daily.    Marland Kitchen gabapentin (NEURONTIN) 600 MG tablet Take 1 tablet (600 mg total) by mouth 3 (three) times daily. 90 tablet 3   No facility-administered medications prior to visit.    Allergies  Allergen Reactions  . Aspirin Other (See Comments)    stomach upset stomach upset   .  Hydrocodone-Acetaminophen Other (See Comments)    made her sick made her sick   . Lyrica [Pregabalin]   . Topiramate     Review of Systems  Constitutional: Positive for activity change and fatigue. Negative for chills and fever.       States that physical activity is moderately limited due to the severity of pain she is having.   HENT: Negative for congestion and sinus pain.  Eyes: Negative.   Respiratory: Negative for cough and wheezing.   Cardiovascular: Negative for chest pain and palpitations.  Gastrointestinal: Negative for constipation, nausea and vomiting.  Genitourinary: Negative for dysuria, frequency and urgency.  Musculoskeletal: Positive for arthralgias, back pain and gait problem. Negative for myalgias.       Back pain is midline and severe. Radiates to the left buttock and sends hot, shooting pain to the left upper leg.   Skin: Negative for rash.  Neurological: Negative for dizziness, weakness and headaches.  Hematological: Negative.   Psychiatric/Behavioral: Positive for dysphoric mood. The patient is not nervous/anxious.   All other systems reviewed and are negative.      Objective:    Physical Exam Vitals and nursing note reviewed.  Constitutional:      General: She is in acute distress.     Appearance: Normal appearance. She is well-developed. She is obese.  HENT:     Head: Normocephalic and atraumatic.  Eyes:     Pupils: Pupils are equal, round, and reactive to light.  Cardiovascular:     Rate and Rhythm: Normal rate and regular rhythm.     Heart sounds: Normal heart sounds.  Pulmonary:     Effort: Pulmonary effort is normal.     Breath sounds: Normal breath sounds.  Abdominal:     Palpations: Abdomen is soft.  Musculoskeletal:        General: Normal range of motion.     Cervical back: Normal range of motion and neck supple.  Skin:    General: Skin is warm and dry.  Neurological:     Mental Status: She is alert and oriented to person, place, and  time.    Today's Vitals   01/08/21 1329  BP: 113/71  Pulse: (!) 101  Temp: 98 F (36.7 C)  SpO2: 98%  Weight: 274 lb 11.2 oz (124.6 kg)  Height: 5\' 5"  (1.651 m)   Body mass index is 45.71 kg/m.   Wt Readings from Last 3 Encounters:  01/08/21 274 lb 11.2 oz (124.6 kg)  12/05/20 275 lb (124.7 kg)  11/25/20 278 lb (126.1 kg)    Health Maintenance Due  Topic Date Due  . COVID-19 Vaccine (1) Never done    There are no preventive care reminders to display for this patient.   Lab Results  Component Value Date   TSH 2.820 10/14/2020   Lab Results  Component Value Date   WBC 8.0 10/14/2020   HGB 14.0 10/14/2020   HCT 41.5 10/14/2020   MCV 93 10/14/2020   PLT 353 10/14/2020   Lab Results  Component Value Date   NA 144 10/14/2020   K 4.8 10/14/2020   CO2 26 10/14/2020   GLUCOSE 112 (H) 10/14/2020   BUN 11 10/14/2020   CREATININE 0.71 10/14/2020   BILITOT 0.4 11/25/2020   ALKPHOS 131 (H) 11/25/2020   AST 47 (H) 11/25/2020   ALT 44 (H) 11/25/2020   PROT 7.1 11/25/2020   ALBUMIN 4.4 11/25/2020   CALCIUM 9.8 10/14/2020   Lab Results  Component Value Date   CHOL 187 10/14/2020   Lab Results  Component Value Date   HDL 70 10/14/2020   Lab Results  Component Value Date   LDLCALC 97 10/14/2020   Lab Results  Component Value Date   TRIG 117 10/14/2020   Lab Results  Component Value Date   CHOLHDL 2.7 10/14/2020   Lab Results  Component Value Date   HGBA1C 5.7 (H) 10/14/2020  Assessment & Plan:  1. Acute midline low back pain with left-sided sciatica Will start prednisone taper. Take as directed for 12 days. Will get x-ray of lumbar spine for further evaluation. May require referral to neurosurgery. Continue regular visits with pain management provider as scheduled.  - predniSONE (STERAPRED UNI-PAK 48 TAB) 10 MG (48) TBPK tablet; 12 day taper - take by mouth as directed for 12 days  Dispense: 48 tablet; Refill: 0 - DG Lumbar Spine 2-3 Views;  Future  2. Chronic pain syndrome Continue Cymbalta as prescribed. Follow up with pain management provider as scheduled and sooner if needed.    Problem List Items Addressed This Visit      Nervous and Auditory   Acute midline low back pain with left-sided sciatica - Primary   Relevant Medications   predniSONE (STERAPRED UNI-PAK 48 TAB) 10 MG (48) TBPK tablet   Other Relevant Orders   DG Lumbar Spine 2-3 Views     Other   Chronic pain syndrome   Relevant Medications   predniSONE (STERAPRED UNI-PAK 48 TAB) 10 MG (48) TBPK tablet       Meds ordered this encounter  Medications  . predniSONE (STERAPRED UNI-PAK 48 TAB) 10 MG (48) TBPK tablet    Sig: 12 day taper - take by mouth as directed for 12 days    Dispense:  48 tablet    Refill:  0    Order Specific Question:   Supervising Provider    Answer:   Beatrice Lecher D [[id number]]   Time spent with the patient was approximately 30 minutes.   Ronnell Freshwater, NP

## 2021-01-08 NOTE — Patient Instructions (Addendum)
Sciatica Rehab Ask your health care provider which exercises are safe for you. Do exercises exactly as told by your health care provider and adjust them as directed. It is normal to feel mild stretching, pulling, tightness, or discomfort as you do these exercises. Stop right away if you feel sudden pain or your pain gets worse. Do not begin these exercises until told by your health care provider. Stretching and range-of-motion exercises These exercises warm up your muscles and joints and improve the movement and flexibility of your hips and back. These exercises also help to relieve pain, numbness, and tingling. Sciatic nerve glide 1. Sit in a chair with your head facing down toward your chest. Place your hands behind your back. Let your shoulders slump forward. 2. Slowly straighten one of your legs while you tilt your head back as if you are looking toward the ceiling. Only straighten your leg as far as you can without making your symptoms worse. 3. Hold this position for __________ seconds. 4. Slowly return to the starting position. 5. Repeat with your other leg. Repeat __________ times. Complete this exercise __________ times a day. Knee to chest with hip adduction and internal rotation 1. Lie on your back on a firm surface with both legs straight. 2. Bend one of your knees and move it up toward your chest until you feel a gentle stretch in your lower back and buttock. Then, move your knee toward the shoulder that is on the opposite side from your leg. This is hip adduction and internal rotation. ? Hold your leg in this position by holding on to the front of your knee. 3. Hold this position for __________ seconds. 4. Slowly return to the starting position. 5. Repeat with your other leg. Repeat __________ times. Complete this exercise __________ times a day.   Prone extension on elbows 1. Lie on your abdomen on a firm surface. A bed may be too soft for this exercise. 2. Prop yourself up on  your elbows. 3. Use your arms to help lift your chest up until you feel a gentle stretch in your abdomen and your lower back. ? This will place some of your body weight on your elbows. If this is uncomfortable, try stacking pillows under your chest. ? Your hips should stay down, against the surface that you are lying on. Keep your hip and back muscles relaxed. 4. Hold this position for __________ seconds. 5. Slowly relax your upper body and return to the starting position. Repeat __________ times. Complete this exercise __________ times a day.   Strengthening exercises These exercises build strength and endurance in your back. Endurance is the ability to use your muscles for a long time, even after they get tired. Pelvic tilt This exercise strengthens the muscles that lie deep in the abdomen. 1. Lie on your back on a firm surface. Bend your knees and keep your feet flat on the floor. 2. Tense your abdominal muscles. Tip your pelvis up toward the ceiling and flatten your lower back into the floor. ? To help with this exercise, you may place a small towel under your lower back and try to push your back into the towel. 3. Hold this position for __________ seconds. 4. Let your muscles relax completely before you repeat this exercise. Repeat __________ times. Complete this exercise __________ times a day. Alternating arm and leg raises 1. Get on your hands and knees on a firm surface. If you are on a hard floor, you may want to  use padding, such as an exercise mat, to cushion your knees. 2. Line up your arms and legs. Your hands should be directly below your shoulders, and your knees should be directly below your hips. 3. Lift your left leg behind you. At the same time, raise your right arm and straighten it in front of you. ? Do not lift your leg higher than your hip. ? Do not lift your arm higher than your shoulder. ? Keep your abdominal and back muscles tight. ? Keep your hips facing the  ground. ? Do not arch your back. ? Keep your balance carefully, and do not hold your breath. 4. Hold this position for __________ seconds. 5. Slowly return to the starting position. 6. Repeat with your right leg and your left arm. Repeat __________ times. Complete this exercise __________ times a day.   Posture and body mechanics Good posture and healthy body mechanics can help to relieve stress in your body's tissues and joints. Body mechanics refers to the movements and positions of your body while you do your daily activities. Posture is part of body mechanics. Good posture means:  Your spine is in its natural S-curve position (neutral).  Your shoulders are pulled back slightly.  Your head is not tipped forward. Follow these guidelines to improve your posture and body mechanics in your everyday activities. Standing  When standing, keep your spine neutral and your feet about hip width apart. Keep a slight bend in your knees. Your ears, shoulders, and hips should line up.  When you do a task in which you stand in one place for a long time, place one foot up on a stable object that is 2-4 inches (5-10 cm) high, such as a footstool. This helps keep your spine neutral.   Sitting  When sitting, keep your spine neutral and keep your feet flat on the floor. Use a footrest, if necessary, and keep your thighs parallel to the floor. Avoid rounding your shoulders, and avoid tilting your head forward.  When working at a desk or a computer, keep your desk at a height where your hands are slightly lower than your elbows. Slide your chair under your desk so you are close enough to maintain good posture.  When working at a computer, place your monitor at a height where you are looking straight ahead and you do not have to tilt your head forward or downward to look at the screen.   Resting  When lying down and resting, avoid positions that are most painful for you.  If you have pain with activities  such as sitting, bending, stooping, or squatting, lie in a position in which your body does not bend very much. For example, avoid curling up on your side with your arms and knees near your chest (fetal position).  If you have pain with activities such as standing for a long time or reaching with your arms, lie with your spine in a neutral position and bend your knees slightly. Try the following positions: ? Lying on your side with a pillow between your knees. ? Lying on your back with a pillow under your knees. Lifting  When lifting objects, keep your feet at least shoulder width apart and tighten your abdominal muscles.  Bend your knees and hips and keep your spine neutral. It is important to lift using the strength of your legs, not your back. Do not lock your knees straight out.  Always ask for help to lift heavy or awkward objects.  This information is not intended to replace advice given to you by your health care provider. Make sure you discuss any questions you have with your health care provider. Document Revised: 01/05/2019 Document Reviewed: 10/05/2018 Elsevier Patient Education  [place].

## 2021-01-08 NOTE — Progress Notes (Signed)
Pamela Antigua, MD [address]  [place]  [place], [place] [address]  Main: [phone]  Fax: [phone]   Primary Care Physician: Lorrene Reid, PA-C  Virtual Visit via Video Note  I connected with patient on 01/08/21 at  3:30 PM EDT by video and verified that I am speaking with the correct person using two identifiers.   I discussed the limitations, risks, security and privacy concerns of performing an evaluation and management service by video and the availability of in person appointments. I also discussed with the patient that there may be a patient responsible charge related to this service. The patient expressed understanding and agreed to proceed.  Location of Patient: Home Location of Provider: Home Persons involved: Patient and provider only (Nursing staff checked in patient via phone but were not physically involved in the video interaction - see their notes)   History of Present Illness: Chief complaint: Abdominal pain  HPI: Pamela Esparza is a 55 y.o. female patient reports improvement in abdominal pain to complete resolution at this time.  Patient underwent EGD for her symptoms, which showed thickened folds in the duodenal bulb.  Exam was otherwise normal.  Biopsies showed reactive duodenitis, and were otherwise benign.  Patient reports that she does not feel like food is getting stuck in her esophagus, but rather that she is not able to complete a swallow properly.  Previous history: Colonoscopy for screening in December 2020 with 3 polyps removed, largest 12 mm in size.  Polyp showed adenoma and sessile serrated polyp.  Repeat recommended in 3 years  Current Outpatient Medications  Medication Sig Dispense Refill  . albuterol (VENTOLIN HFA) 108 (90 Base) MCG/ACT inhaler INHALE 1 TO 2 PUFFS INTO THE LUNGS EVERY 6 HOURS AS NEEDED 54 g 0  . ALPRAZolam (XANAX) 0.5 MG tablet Take 0.5 mg by mouth daily as needed.    Marland Kitchen atorvastatin (LIPITOR) 10 MG tablet  Take 1 tablet (10 mg total) by mouth daily. 90 tablet 1  . baclofen (LIORESAL) 10 MG tablet Take 10 mg by mouth 3 (three) times daily as needed.    . Cholecalciferol (VITAMIN D3) 75 MCG (3000 UT) TABS Take 2,000 Int'l Units by mouth daily.    . DULoxetine (CYMBALTA) 60 MG capsule Take 120 mg by mouth daily.    . fluticasone (FLONASE) 50 MCG/ACT nasal spray SHAKE LIQUID AND USE 1 SPRAY IN EACH NOSTRIL DAILY 16 g 3  . gabapentin (NEURONTIN) 600 MG tablet Take 1 tablet (600 mg total) by mouth 3 (three) times daily. 90 tablet 3  . levothyroxine (SYNTHROID) 50 MCG tablet Take 1 tablet (50 mcg total) by mouth daily before breakfast. 90 tablet 1  . meloxicam (MOBIC) 15 MG tablet Take 15 mg by mouth.    . metoprolol tartrate (LOPRESSOR) 50 MG tablet Take 1 tablet (50 mg) 1-2 hours prior to your cardiac CT. Bring the second tablet with you to your appointment but don't take unless instructed. 2 tablet 0  . Multiple Vitamins-Minerals (CENTRUM SILVER PO) Take by mouth.    . nitroGLYCERIN (NITROSTAT) 0.4 MG SL tablet Place under the tongue.    Marland Kitchen oxyCODONE-acetaminophen (PERCOCET) 10-325 MG tablet Take 1 tablet by mouth 2 (two) times daily.    . pilocarpine (SALAGEN) 5 MG tablet Take 5 mg by mouth 3 (three) times daily.     No current facility-administered medications for this visit.    Allergies as of 01/07/2021 - Review Complete 12/05/2020  Allergen Reaction Noted  .  Aspirin Other (See Comments) 03/12/2015  . Hydrocodone-acetaminophen Other (See Comments) 10/23/2018  . Lyrica [pregabalin]  10/23/2018  . Topiramate  06/25/2016    Review of Systems:    All systems reviewed and negative except where noted in HPI.   Observations/Objective:  Labs: CMP     Component Value Date/Time   NA 144 10/14/2020 1109   K 4.8 10/14/2020 1109   CL 101 10/14/2020 1109   CO2 26 10/14/2020 1109   GLUCOSE 112 (H) 10/14/2020 1109   BUN 11 10/14/2020 1109   CREATININE 0.71 10/14/2020 1109   CALCIUM 9.8  10/14/2020 1109   PROT 7.1 11/25/2020 1501   ALBUMIN 4.4 11/25/2020 1501   AST 47 (H) 11/25/2020 1501   ALT 44 (H) 11/25/2020 1501   ALKPHOS 131 (H) 11/25/2020 1501   BILITOT 0.4 11/25/2020 1501   GFRNONAA 96 10/14/2020 1109   GFRAA 110 10/14/2020 1109   Lab Results  Component Value Date   WBC 8.0 10/14/2020   HGB 14.0 10/14/2020   HCT 41.5 10/14/2020   MCV 93 10/14/2020   PLT 353 10/14/2020    Imaging Studies: No results found.  Assessment and Plan:   Pamela Esparza is a 55 y.o. y/o female here for follow-up of abdominal pain  Assessment and Plan: Abdominal pain has resolved  Recommend modified barium swallow for possible oropharyngeal dysphagia  Finding of fatty liver on imaging discussed with patient Diet, weight loss, and exercise encouraged along with avoiding hepatotoxic drugs including alcohol Risk of progression to cirrhosis if above measures are not instituted were discussed as well, and patient verbalized understanding  Lab work for fatty liver was benign.  Hepatitis a and B vaccination is recommended and patient is willing to schedule.  Our nursing staff will call her and schedule nurse visit for hep a and B series  Follow Up Instructions:    I discussed the assessment and treatment plan with the patient. The patient was provided an opportunity to ask questions and all were answered. The patient agreed with the plan and demonstrated an understanding of the instructions.   The patient was advised to call back or seek an in-person evaluation if the symptoms worsen or if the condition fails to improve as anticipated.  I provided 15 minutes of face-to-face time via video software during this encounter. Additional time was spent in reviewing patient's chart, placing orders etc.   Virgel Manifold, MD  Speech recognition software was used to dictate this note.

## 2021-01-08 NOTE — Addendum Note (Signed)
Addended by: Wayna Chalet on: 01/08/2021 02:58 PM   Modules accepted: Orders

## 2021-01-10 NOTE — Progress Notes (Deleted)
Cardiology Office Note:    Date:  01/16/2021   ID:  Pamela Esparza, DOB 08-Apr-1966, MRN [id number]  PCP:  Lorrene Reid, PA-C  [facility] Cardiologist:  Freada Bergeron, MD  [facility] Electrophysiologist:  None   Referring MD: Lorrene Reid, PA-C    History of Present Illness:    Pamela Esparza is a 55 y.o. female with a hx of anxiety, asthma, HLD, fibromyalgia, Sjogren's and depression who presents for follow-up.  During last visit on 10/27/20, the patient was complaining of intermittent palpitations as well as profound fatigue that began after COVID. We obtained a cardiac monitor which revealed brief runs of NS SVT, rare ectopy and no significant arrhythmias. TTE with LVEF 60-65% with no significant valve disease. Coronary CTA without significant disease and calcium score 0.  Today,   Past Medical History:  Diagnosis Date  . Anxiety   . Arthritis   . Asthma   . Cataract   . DDD (degenerative disc disease), lumbar   . Depression   . Depression    Phreesia 10/13/2020  . Fibromyalgia   . Hyperlipidemia   . Joint pain   . Neuromuscular disorder ([place])    Phreesia 10/13/2020  . Neuropathy   . Sjogren's disease ([place])   . Thyroid disease     Past Surgical History:  Procedure Laterality Date  . ABDOMINAL HYSTERECTOMY    . APPENDECTOMY    . CHOLECYSTECTOMY    . ESOPHAGOGASTRODUODENOSCOPY (EGD) WITH PROPOFOL N/A 12/05/2020   Procedure: ESOPHAGOGASTRODUODENOSCOPY (EGD) WITH PROPOFOL;  Surgeon: Virgel Manifold, MD;  Location: [facility] ENDOSCOPY;  Service: Endoscopy;  Laterality: N/A;  . finger sugery    . NECK SURGERY    . TENDON TRANSFER Right 11/06/2019   Procedure: PERONEAL RECONSTRUCTION WITH PERONEAL LONGUS RIGHT LEG;  Surgeon: Newt Minion, MD;  Location: [place];  Service: Orthopedics;  Laterality: Right;  . TUBAL LIGATION N/A    Phreesia 10/13/2020    Current Medications: Current Meds  Medication Sig  . albuterol (VENTOLIN  HFA) 108 (90 Base) MCG/ACT inhaler INHALE 1 TO 2 PUFFS INTO THE LUNGS EVERY 6 HOURS AS NEEDED  . ALPRAZolam (XANAX) 0.5 MG tablet Take 0.5 mg by mouth daily as needed.  Marland Kitchen atorvastatin (LIPITOR) 10 MG tablet Take 1 tablet (10 mg total) by mouth daily.  . baclofen (LIORESAL) 10 MG tablet Take 10 mg by mouth 3 (three) times daily as needed.  . Cholecalciferol (VITAMIN D3) 75 MCG (3000 UT) TABS Take 2,000 Int'l Units by mouth daily.  . DULoxetine (CYMBALTA) 60 MG capsule Take 120 mg by mouth daily.  . fluticasone (FLONASE) 50 MCG/ACT nasal spray SHAKE LIQUID AND USE 1 SPRAY IN EACH NOSTRIL DAILY  . gabapentin (NEURONTIN) 600 MG tablet TAKE 1 TABLET(600 MG) BY MOUTH THREE TIMES DAILY  . levothyroxine (SYNTHROID) 50 MCG tablet Take 1 tablet (50 mcg total) by mouth daily before breakfast.  . meloxicam (MOBIC) 15 MG tablet Take 15 mg by mouth.  . Multiple Vitamins-Minerals (CENTRUM SILVER PO) Take by mouth.  . nitroGLYCERIN (NITROSTAT) 0.4 MG SL tablet Place under the tongue.  Marland Kitchen oxyCODONE-acetaminophen (PERCOCET) 10-325 MG tablet Take 1 tablet by mouth 2 (two) times daily.  . pilocarpine (SALAGEN) 5 MG tablet Take 5 mg by mouth 3 (three) times daily.  . predniSONE (STERAPRED UNI-PAK 48 TAB) 10 MG (48) TBPK tablet 12 day taper - take by mouth as directed for 12 days     Allergies:   Aspirin, Hydrocodone-acetaminophen,  Lyrica [pregabalin], and Topiramate   Social History   Socioeconomic History  . Marital status: Married    Spouse name: Not on file  . Number of children: Not on file  . Years of education: Not on file  . Highest education level: Not on file  Occupational History  . Not on file  Tobacco Use  . Smoking status: Never Smoker  . Smokeless tobacco: Never Used  Vaping Use  . Vaping Use: Never used  Substance and Sexual Activity  . Alcohol use: Yes    Comment: 4 drinks a year-rare  . Drug use: Never  . Sexual activity: Not Currently  Other Topics Concern  . Not on file  Social  History Narrative  . Not on file   Social Determinants of Health   Financial Resource Strain: Not on file  Food Insecurity: Not on file  Transportation Needs: Not on file  Physical Activity: Not on file  Stress: Not on file  Social Connections: Not on file     Family History: The patient's family history includes Birth defects in her maternal grandmother and paternal grandmother; Cancer in her mother; Depression in her maternal grandmother; Diabetes in her father; Heart attack in her father; Hypertension in her father. There is no history of Colon cancer, Colon polyps, Esophageal cancer, Stomach cancer, or Rectal cancer.  ROS:   Please see the history of present illness.    Review of Systems  Constitutional: Positive for malaise/fatigue. Negative for chills and fever.  HENT: Negative for congestion.   Eyes: Negative for blurred vision.  Respiratory: Positive for shortness of breath.   Cardiovascular: Positive for palpitations. Negative for chest pain, orthopnea, claudication, leg swelling and PND.  Gastrointestinal: Negative for melena, nausea and vomiting.  Genitourinary: Negative for hematuria.  Musculoskeletal: Positive for joint pain and myalgias.  Neurological: Negative for dizziness and loss of consciousness.  Endo/Heme/Allergies: Negative for polydipsia.  Psychiatric/Behavioral: Negative for substance abuse. The patient is nervous/anxious and has insomnia.     EKGs/Labs/Other Studies Reviewed:    The following studies were reviewed today: Cardiac monitor 12/02/20:   Patient's monitoring period was from 10/31/20-11/29/20  Predominant rhythm was NSR with average HR 69bpm; ranging from 57-110bpm  There were 3 runs of NSVT with longest lasting 7 beats at HR 138  Ventricular ectopy was rare <1%, SVE rare <1%  No afib, significant pauses, or sustained arrhythmias  TTE 11/14/20: IMPRESSIONS    1. Left ventricular ejection fraction, by estimation, is 60 to 65%. The   left ventricle has normal function. The left ventricle has no regional  wall motion abnormalities. Left ventricular diastolic parameters were  normal.  2. Right ventricular systolic function is normal. The right ventricular  size is normal.  3. The mitral valve is normal in structure. No evidence of mitral valve  regurgitation.  4. The aortic valve is normal in structure. Aortic valve regurgitation is  not visualized. No aortic stenosis is present.   Coronary CTA 11/28/20: FINDINGS: A 120 kV prospective scan was triggered in the descending thoracic aorta at 111 HU's. Axial non-contrast 3 mm slices were carried out through the heart. The data set was analyzed on a dedicated work station and scored using the [place]. Gantry rotation speed was 250 msecs and collimation was .6 mm. No beta blockade and 0.8 mg of sl NTG was given. The 3D data set was reconstructed in 5% intervals of the 67-82 % of the R-R cycle. Diastolic phases were analyzed on a dedicated  work station using MPR, MIP and VRT modes. The patient received 80 cc of contrast.  Aorta: Normal size.  No calcifications.  No dissection.  Aortic Valve:  Trileaflet.  No calcifications.  Coronary Arteries:  Normal coronary origin.  Right dominance.  RCA is a large dominant artery that gives rise to PDA and PLVB. There is no plaque.  Left main is a large artery that gives rise to LAD and LCX arteries.  LAD is a large vessel that has no plaque. There are three small diagonal vessels without plaque.  LCX is a non-dominant artery that gives rise to one large OM1 branch and two small diagonal branches. There is no plaque.  Other findings:  Normal pulmonary vein drainage into the left atrium.  Normal let atrial appendage without a thrombus.  Normal size of the pulmonary artery.  IMPRESSION: 1. Coronary calcium score of 0. This was 0 percentile for age and sex matched control.  2. Normal coronary  origin with right dominance.  3. No evidence of CAD.  EKG:  EKG is  ordered today.  The ekg ordered today demonstrates NSR with HR 73  Recent Labs: 10/14/2020: BUN 11; Creatinine, Ser 0.71; Hemoglobin 14.0; Platelets 353; Potassium 4.8; Sodium 144; TSH 2.820 11/25/2020: ALT 44  Recent Lipid Panel    Component Value Date/Time   CHOL 187 10/14/2020 1109   TRIG 117 10/14/2020 1109   HDL 70 10/14/2020 1109   CHOLHDL 2.7 10/14/2020 1109   LDLCALC 97 10/14/2020 1109   LDLDIRECT 146 (H) 08/14/2019 1155     Physical Exam:    VS:  BP 114/80   Pulse 83   Ht 5\' 5"  (1.651 m)   Wt 273 lb 5.4 oz (124 kg)   SpO2 98%   BMI 45.49 kg/m     Wt Readings from Last 3 Encounters:  01/16/21 273 lb 5.4 oz (124 kg)  01/08/21 274 lb 11.2 oz (124.6 kg)  12/05/20 275 lb (124.7 kg)     GEN: No acute distress.   Neck: No JVD Cardiac: RRR, no murmurs, rubs, or gallops.  Respiratory: Clear to auscultation bilaterally. GI: Soft, nontender, non-distended  MS: No edema; No deformity. Neuro:  Nonfocal  Psych: Normal affect   ASSESSMENT:    No diagnosis found. PLAN:    In order of problems listed above:  #Palpitations: Patient with 1-2 episodes of palpitations every month where she feels like her heart is racing. Each episode lasts a couple of minutes before resolving. Has associated shortness of breath. No chest pain, lightheadedness, dizziness or syncope. Symptoms worsened with exertion but can occur at rest. No known cardiac history. Cardiac monitor reassuringly normal with brief episodes of NSVT with longest lasting 8 beats. No Afib or sustained arrhythmias -30 day monitor without significant ectopy -TTE with normal LVEF, no significant valve disease -Coronary CTA without obstructive disease -TSH normal, HgB 14  #DOE: Cardiac work-up reassuringly normal. Coronary CTA without significant disease. TTE with normal LVEF, no valvular disease. Cardiac monitor without significant arrhythmias.   -Cardiac work-up reassuring -Refer to Pulm  #HLD:  LDL 97 -Continue atorvastatin 10mg  daily -Calcium score 0  #Fibromyalgia: #Chronic Pain: #Restless Leg Syndrome: -Management per primary care    Medication Adjustments/Labs and Tests Ordered: Current medicines are reviewed at length with the patient today.  Concerns regarding medicines are outlined above.  No orders of the defined types were placed in this encounter.  No orders of the defined types were placed in this encounter.   There  are no Patient Instructions on file for this visit.   Signed, Freada Bergeron, MD  01/16/2021 8:59 AM    [facility]

## 2021-01-12 DIAGNOSIS — M5442 Lumbago with sciatica, left side: Secondary | ICD-10-CM | POA: Insufficient documentation

## 2021-01-12 DIAGNOSIS — G894 Chronic pain syndrome: Secondary | ICD-10-CM | POA: Insufficient documentation

## 2021-01-15 ENCOUNTER — Ambulatory Visit
Admission: RE | Admit: 2021-01-15 | Discharge: 2021-01-15 | Disposition: A | Discharge status: Home / Self Care | Payer: Federal, State, Local not specified - PPO | Source: Ambulatory Visit | Attending: Nurse Practitioner | Admitting: Nurse Practitioner

## 2021-01-15 DIAGNOSIS — M5442 Lumbago with sciatica, left side: Secondary | ICD-10-CM

## 2021-01-15 DIAGNOSIS — M545 Low back pain, unspecified: Secondary | ICD-10-CM | POA: Diagnosis not present

## 2021-01-16 ENCOUNTER — Encounter: Payer: Self-pay | Admitting: Cardiology

## 2021-01-16 ENCOUNTER — Ambulatory Visit (INDEPENDENT_AMBULATORY_CARE_PROVIDER_SITE_OTHER): Payer: Federal, State, Local not specified - PPO | Admitting: Cardiology

## 2021-01-16 ENCOUNTER — Other Ambulatory Visit: Payer: Self-pay

## 2021-01-16 VITALS — BP 114/80 | HR 83 | Ht 65.0 in | Wt 273.3 lb

## 2021-01-16 DIAGNOSIS — M797 Fibromyalgia: Secondary | ICD-10-CM

## 2021-01-16 DIAGNOSIS — R002 Palpitations: Secondary | ICD-10-CM | POA: Diagnosis not present

## 2021-01-16 DIAGNOSIS — E7800 Pure hypercholesterolemia, unspecified: Secondary | ICD-10-CM | POA: Diagnosis not present

## 2021-01-16 DIAGNOSIS — R0602 Shortness of breath: Secondary | ICD-10-CM | POA: Diagnosis not present

## 2021-01-16 NOTE — Patient Instructions (Signed)
Medication Instructions:   Your physician recommends that you continue on your current medications as directed. Please refer to the Current Medication list given to you today.  *If you need a refill on your cardiac medications before your next appointment, please call your pharmacy*   Follow-Up:  WITH DR. Johney Frame AS NEEDED

## 2021-01-16 NOTE — Progress Notes (Signed)
Cardiology Office Note:    Date:  01/16/2021   ID:  Pamela Esparza, DOB June 06, 1966, MRN [id number]  PCP:  Lorrene Reid, PA-C  [facility] Cardiologist:  Freada Bergeron, MD  [facility] Electrophysiologist:  None   Referring MD: Lorrene Reid, PA-C    History of Present Illness:    Pamela Esparza is a 55 y.o. female with a hx of anxiety, asthma, HLD, fibromyalgia, Sjogren's and depression who presents for follow-up.  During last visit on 10/27/20, the patient was complaining of intermittent palpitations as well as profound fatigue that began after COVID. We obtained a cardiac monitor which revealed brief runs of NS SVT, rare ectopy and no significant arrhythmias. TTE with LVEF 60-65% with no significant valve disease. Coronary CTA without significant disease and calcium score 0.   Today, she is feeling good. She still has intermittent palpitations "once in a while" but they are not overly bothersome. She is taking Lipitor-10 and tolerates it well. Not currently on any blood pressure medication and her pressures are well controlled at home 110/120s. No chest pain, has chronic shortness of breath. Has been dealing with a lot of myalgias from her fibromyalgia and neuropathy. We also discussed Coronary CTA, ECHO, and monitor results in detail today.   She is having some difficulty losing weight, which she attributes to being on steroids. She is following a keto diet, and reports being around 270 pounds lately (prviously 290lbs).   Past Medical History:  Diagnosis Date  . Anxiety   . Arthritis   . Asthma   . Cataract   . DDD (degenerative disc disease), lumbar   . Depression   . Depression    Phreesia 10/13/2020  . Fibromyalgia   . Hyperlipidemia   . Joint pain   . Neuromuscular disorder ([place])    Phreesia 10/13/2020  . Neuropathy   . Sjogren's disease ([place])   . Thyroid disease     Past Surgical History:  Procedure Laterality Date  . ABDOMINAL HYSTERECTOMY     . APPENDECTOMY    . CHOLECYSTECTOMY    . ESOPHAGOGASTRODUODENOSCOPY (EGD) WITH PROPOFOL N/A 12/05/2020   Procedure: ESOPHAGOGASTRODUODENOSCOPY (EGD) WITH PROPOFOL;  Surgeon: Virgel Manifold, MD;  Location: [facility] ENDOSCOPY;  Service: Endoscopy;  Laterality: N/A;  . finger sugery    . NECK SURGERY    . TENDON TRANSFER Right 11/06/2019   Procedure: PERONEAL RECONSTRUCTION WITH PERONEAL LONGUS RIGHT LEG;  Surgeon: Newt Minion, MD;  Location: [place];  Service: Orthopedics;  Laterality: Right;  . TUBAL LIGATION N/A    Phreesia 10/13/2020    Current Medications: Current Meds  Medication Sig  . albuterol (VENTOLIN HFA) 108 (90 Base) MCG/ACT inhaler INHALE 1 TO 2 PUFFS INTO THE LUNGS EVERY 6 HOURS AS NEEDED  . ALPRAZolam (XANAX) 0.5 MG tablet Take 0.5 mg by mouth daily as needed.  Marland Kitchen atorvastatin (LIPITOR) 10 MG tablet Take 1 tablet (10 mg total) by mouth daily.  . baclofen (LIORESAL) 10 MG tablet Take 10 mg by mouth 3 (three) times daily as needed.  . Cholecalciferol (VITAMIN D3) 75 MCG (3000 UT) TABS Take 2,000 Int'l Units by mouth daily.  . DULoxetine (CYMBALTA) 60 MG capsule Take 120 mg by mouth daily.  . fluticasone (FLONASE) 50 MCG/ACT nasal spray SHAKE LIQUID AND USE 1 SPRAY IN EACH NOSTRIL DAILY  . gabapentin (NEURONTIN) 600 MG tablet TAKE 1 TABLET(600 MG) BY MOUTH THREE TIMES DAILY  . levothyroxine (SYNTHROID) 50 MCG tablet Take 1  tablet (50 mcg total) by mouth daily before breakfast.  . meloxicam (MOBIC) 15 MG tablet Take 15 mg by mouth.  . Multiple Vitamins-Minerals (CENTRUM SILVER PO) Take by mouth.  . nitroGLYCERIN (NITROSTAT) 0.4 MG SL tablet Place under the tongue.  Marland Kitchen oxyCODONE-acetaminophen (PERCOCET) 10-325 MG tablet Take 1 tablet by mouth 2 (two) times daily.  . pilocarpine (SALAGEN) 5 MG tablet Take 5 mg by mouth 3 (three) times daily.  . predniSONE (STERAPRED UNI-PAK 48 TAB) 10 MG (48) TBPK tablet 12 day taper - take by mouth as directed for 12 days      Allergies:   Aspirin, Hydrocodone-acetaminophen, Lyrica [pregabalin], and Topiramate   Social History   Socioeconomic History  . Marital status: Married    Spouse name: Not on file  . Number of children: Not on file  . Years of education: Not on file  . Highest education level: Not on file  Occupational History  . Not on file  Tobacco Use  . Smoking status: Never Smoker  . Smokeless tobacco: Never Used  Vaping Use  . Vaping Use: Never used  Substance and Sexual Activity  . Alcohol use: Yes    Comment: 4 drinks a year-rare  . Drug use: Never  . Sexual activity: Not Currently  Other Topics Concern  . Not on file  Social History Narrative  . Not on file   Social Determinants of Health   Financial Resource Strain: Not on file  Food Insecurity: Not on file  Transportation Needs: Not on file  Physical Activity: Not on file  Stress: Not on file  Social Connections: Not on file     Family History: The patient's family history includes Birth defects in her maternal grandmother and paternal grandmother; Cancer in her mother; Depression in her maternal grandmother; Diabetes in her father; Heart attack in her father; Hypertension in her father. There is no history of Colon cancer, Colon polyps, Esophageal cancer, Stomach cancer, or Rectal cancer.  ROS:   Please see the history of present illness.    Review of Systems  Constitutional: Positive for malaise/fatigue. Negative for chills and fever.  HENT: Negative for congestion.   Eyes: Negative for blurred vision.  Respiratory: Positive for shortness of breath.   Cardiovascular: Positive for palpitations. Negative for chest pain, orthopnea, claudication, leg swelling and PND.  Gastrointestinal: Negative for melena, nausea and vomiting.  Genitourinary: Negative for hematuria.  Musculoskeletal: Positive for joint pain and myalgias.  Neurological: Negative for dizziness and loss of consciousness.  Endo/Heme/Allergies: Negative  for polydipsia.  Psychiatric/Behavioral: Negative for substance abuse. The patient is nervous/anxious and has insomnia.     EKGs/Labs/Other Studies Reviewed:    The following studies were reviewed today: Cardiac monitor 12/02/20:  Patient's monitoring period was from 10/31/20-11/29/20  Predominant rhythm was NSR with average HR 69bpm; ranging from 57-110bpm  There were 3 runs of NSVT with longest lasting 7 beats at HR 138  Ventricular ectopy was rare <1%, SVE rare <1%  No afib, significant pauses, or sustained arrhythmias  TTE 11/14/20: IMPRESSIONS  1. Left ventricular ejection fraction, by estimation, is 60 to 65%. The  left ventricle has normal function. The left ventricle has no regional  wall motion abnormalities. Left ventricular diastolic parameters were  normal.  2. Right ventricular systolic function is normal. The right ventricular  size is normal.  3. The mitral valve is normal in structure. No evidence of mitral valve  regurgitation.  4. The aortic valve is normal in structure. Aortic  valve regurgitation is  not visualized. No aortic stenosis is present.   Coronary CTA 11/28/20: FINDINGS: A 120 kV prospective scan was triggered in the descending thoracic aorta at 111 HU's. Axial non-contrast 3 mm slices were carried out through the heart. The data set was analyzed on a dedicated work station and scored using the [place]. Gantry rotation speed was 250 msecs and collimation was .6 mm. No beta blockade and 0.8 mg of sl NTG was given. The 3D data set was reconstructed in 5% intervals of the 67-82 % of the R-R cycle. Diastolic phases were analyzed on a dedicated work station using MPR, MIP and VRT modes. The patient received 80 cc of contrast.  Aorta: Normal size.  No calcifications.  No dissection.  Aortic Valve:  Trileaflet.  No calcifications.  Coronary Arteries:  Normal coronary origin.  Right dominance.  RCA is a large dominant artery that  gives rise to PDA and PLVB. There is no plaque.  Left main is a large artery that gives rise to LAD and LCX arteries.  LAD is a large vessel that has no plaque. There are three small diagonal vessels without plaque.  LCX is a non-dominant artery that gives rise to one large OM1 branch and two small diagonal branches. There is no plaque.  Other findings:  Normal pulmonary vein drainage into the left atrium.  Normal let atrial appendage without a thrombus.  Normal size of the pulmonary artery.  IMPRESSION: 1. Coronary calcium score of 0. This was 0 percentile for age and sex matched control.  2. Normal coronary origin with right dominance.  3. No evidence of CAD.  EKG:   01/16/21: EKG is not ordered today.   10/27/20: The ekg ordered today demonstrates NSR with HR 73  Recent Labs: 10/14/2020: BUN 11; Creatinine, Ser 0.71; Hemoglobin 14.0; Platelets 353; Potassium 4.8; Sodium 144; TSH 2.820 11/25/2020: ALT 44  Recent Lipid Panel    Component Value Date/Time   CHOL 187 10/14/2020 1109   TRIG 117 10/14/2020 1109   HDL 70 10/14/2020 1109   CHOLHDL 2.7 10/14/2020 1109   LDLCALC 97 10/14/2020 1109   LDLDIRECT 146 (H) 08/14/2019 1155     Physical Exam:    VS:  BP 114/80   Pulse 83   Ht 5\' 5"  (1.651 m)   Wt 273 lb 5.4 oz (124 kg)   SpO2 98%   BMI 45.49 kg/m     Wt Readings from Last 3 Encounters:  01/16/21 273 lb 5.4 oz (124 kg)  01/08/21 274 lb 11.2 oz (124.6 kg)  12/05/20 275 lb (124.7 kg)     GEN:  Well nourished, well developed in no acute distress. Ambulates intermittently with a walker HEENT: Normal NECK: No JVD; No carotid bruits CARDIAC: RRR, no murmurs, rubs, gallops RESPIRATORY:  Clear to auscultation without rales, wheezing or rhonchi  ABDOMEN: Obese, foft, non-tender, non-distended MUSCULOSKELETAL:  No edema; No deformity  SKIN: Warm and dry NEUROLOGIC:  Alert and oriented x 3 PSYCHIATRIC:  Normal affect   ASSESSMENT:    1.  Palpitations   2. Shortness of breath   3. Pure hypercholesterolemia   4. Muscle pain, fibromyalgia    PLAN:    In order of problems listed above:  #Palpitations: Patient with 1-2 episodes of palpitations every month where she feels like her heart is racing. Each episode lasts a couple of minutes before resolving. Has associated shortness of breath. No chest pain, lightheadedness, dizziness or syncope. Symptoms worsened with exertion  but can occur at rest. No known cardiac history. Cardiac monitor reassuringly normal with brief episodes of NSVT with longest lasting 8 beats. No Afib or sustained arrhythmias -30 day monitor without significant ectopy -TTE with normal LVEF, no significant valve disease -Coronary CTA without obstructive disease -TSH normal, HgB 14  #DOE: Cardiac work-up reassuringly normal. Coronary CTA without significant disease. TTE with normal LVEF, no valvular disease. Cardiac monitor without significant arrhythmias.  -Cardiac work-up reassuring -Refer to Pulm  #HLD:  LDL 97 -Continue atorvastatin 10mg  daily -Calcium score 0  #Fibromyalgia: #Chronic Pain: #Restless Leg Syndrome: -Management per primary care     Medication Adjustments/Labs and Tests Ordered: Current medicines are reviewed at length with the patient today.  Concerns regarding medicines are outlined above.  No orders of the defined types were placed in this encounter.  No orders of the defined types were placed in this encounter.   Patient Instructions  Medication Instructions:   Your physician recommends that you continue on your current medications as directed. Please refer to the Current Medication list given to you today.  *If you need a refill on your cardiac medications before your next appointment, please call your pharmacy*   Follow-Up:  WITH DR. Johney Frame AS NEEDED        Follow-up as needed.  I,Mathew Stumpf,acting as a Education administrator for Freada Bergeron, MD.,have  documented all relevant documentation on the behalf of Freada Bergeron, MD,as directed by  Freada Bergeron, MD while in the presence of Freada Bergeron, MD.  I, Freada Bergeron, MD, have reviewed all documentation for this visit. The documentation on 01/16/21 for the exam, diagnosis, procedures, and orders are all accurate and complete.  Signed, Freada Bergeron, MD  01/16/2021 9:11 AM    [place]

## 2021-01-19 ENCOUNTER — Encounter: Payer: Self-pay | Admitting: Nurse Practitioner

## 2021-01-19 NOTE — Progress Notes (Signed)
My chart message sent to patient. Will refer to neurosurgery/spine specialist if she would like further evaluation and treatment.

## 2021-01-20 ENCOUNTER — Other Ambulatory Visit: Payer: Self-pay | Admitting: Nurse Practitioner

## 2021-01-20 DIAGNOSIS — M47819 Spondylosis without myelopathy or radiculopathy, site unspecified: Secondary | ICD-10-CM

## 2021-01-20 DIAGNOSIS — M5442 Lumbago with sciatica, left side: Secondary | ICD-10-CM

## 2021-01-20 NOTE — Progress Notes (Signed)
A referral to neurosurgery placed after x-ray showing severe lumbar spinal arthropathy. Patient having low back pain, no longer controlled per facet blocks done by her pain management provider.

## 2021-01-20 NOTE — Telephone Encounter (Signed)
Hey. I put a referral to neurosurgery into Epic after x-ray showing severe lumbar spinal arthropathy. Patient having low back pain, no longer controlled per facet blocks done by her pain management provider.

## 2021-01-21 NOTE — Telephone Encounter (Signed)
Opened in error

## 2021-01-28 ENCOUNTER — Other Ambulatory Visit: Payer: Self-pay | Admitting: Nurse Practitioner

## 2021-01-28 ENCOUNTER — Other Ambulatory Visit: Payer: Self-pay | Admitting: Physician Assistant

## 2021-01-28 ENCOUNTER — Telehealth: Payer: Self-pay | Admitting: Physician Assistant

## 2021-01-28 ENCOUNTER — Other Ambulatory Visit: Payer: Self-pay | Admitting: Medical-Surgical

## 2021-01-28 DIAGNOSIS — M5442 Lumbago with sciatica, left side: Secondary | ICD-10-CM

## 2021-01-28 NOTE — Telephone Encounter (Signed)
Per [employer] were cut off. Resending fax now. AS, CMA

## 2021-01-28 NOTE — Telephone Encounter (Signed)
Regina left a voicemail concerning our patient Pamela Esparza and asked to speak with [name] (CMA) and was returning her call. Please call her back at [phone] and press [phone] for the operator to page [facility]. Thanks

## 2021-01-29 ENCOUNTER — Ambulatory Visit
Admission: RE | Admit: 2021-01-29 | Discharge: 2021-01-29 | Disposition: A | Discharge status: Home / Self Care | Payer: Federal, State, Local not specified - PPO | Source: Ambulatory Visit | Attending: Gastroenterology | Admitting: Gastroenterology

## 2021-01-29 ENCOUNTER — Other Ambulatory Visit: Payer: Self-pay

## 2021-01-29 DIAGNOSIS — R1319 Other dysphagia: Secondary | ICD-10-CM | POA: Diagnosis not present

## 2021-01-29 NOTE — Therapy (Addendum)
[place], [place], [address] Phone: [phone]   Fax:     Modified Barium Swallow  Patient Details  Name: Pamela Esparza MRN: [id number] Date of Birth: 07/13/1966 No data recorded  Encounter Date: 01/29/2021   End of Session - 01/29/21 1655     Visit Number 1    Number of Visits 1    Date for SLP Re-Evaluation 01/29/21    SLP Start Time 1245    SLP Stop Time  1345    SLP Time Calculation (min) 60 min    Activity Tolerance Patient tolerated treatment well             Past Medical History:  Diagnosis Date   Anxiety    Arthritis    Asthma    Cataract    DDD (degenerative disc disease), lumbar    Depression    Depression    Phreesia 10/13/2020   Fibromyalgia    Hyperlipidemia    Joint pain    Neuromuscular disorder ([place])    Phreesia 10/13/2020   Neuropathy    Sjogren's disease ([place])    Thyroid disease     Past Surgical History:  Procedure Laterality Date   ABDOMINAL HYSTERECTOMY     APPENDECTOMY     CHOLECYSTECTOMY     ESOPHAGOGASTRODUODENOSCOPY (EGD) WITH PROPOFOL N/A 12/05/2020   Procedure: ESOPHAGOGASTRODUODENOSCOPY (EGD) WITH PROPOFOL;  Surgeon: Virgel Manifold, MD;  Location: [facility];  Service: Endoscopy;  Laterality: N/A;   finger sugery     NECK SURGERY     TENDON TRANSFER Right 11/06/2019   Procedure: PERONEAL RECONSTRUCTION WITH PERONEAL LONGUS RIGHT LEG;  Surgeon: Newt Minion, MD;  Location: [place];  Service: Orthopedics;  Laterality: Right;   TUBAL LIGATION N/A    Phreesia 10/13/2020    There were no vitals filed for this visit.      Subjective: Patient behavior: (alertness, ability to follow instructions, etc.): pt A/O x4. Verbally engaged answering questions re: herself and her swallowing complaints. Pt stated most difficulty w/ solids. She has Baseline dxs including Sjogren's disease and is followed by GI. No recent reports  of tx for pneumonia. She is being seen by MDs for back pain; medications for pain management. Pt report of "neck surgery" w/ hardware noted at ~C5-7(but difficult to appreciate d/t shadow of shoulders). Recent EGD in 11/2020.  Chief complaint: dysphagia   Native Dentition:  missing several OM Exam: wfl. Cough - strong.    Objective:  Radiological Procedure: A videoflouroscopic evaluation of oral-preparatory, reflex initiation, and pharyngeal phases of the swallow was performed; as well as a screening of the upper esophageal phase.  POSTURE: upright VIEW: lateral COMPENSATORY STRATEGIES: none indicated BOLUSES ADMINISTERED:  Thin Liquid: 5 trials   Nectar-thick Liquid: 1 trial  Honey-thick Liquid: NT  Puree: 3 trials  Mechanical Soft: 2 trials RESULTS OF EVALUATION: ORAL PREPARATORY PHASE: (The lips, tongue, and velum are observed for strength and coordination)       **Overall Severity Rating: WFL. Min extra Time needed for full mastication of increased textured trials d/t lacking Dentition. Pt utilized lingual sweeping to clear oral cavity when needed.  SWALLOW INITIATION/REFLEX: (The reflex is normal if "triggered" by the time the bolus reached the base of the tongue)  **Overall Severity Rating: [facility]. Timing of pharyngeal swallow occurred at ~BOT-Valleculae w/ adequate closure of airway. NO aspiration or laryngeal penetration noted w/ any consistency.   PHARYNGEAL PHASE: (Pharyngeal  function is normal if the bolus shows rapid, smooth, and continuous transit through the pharynx and there is no pharyngeal residue after the swallow)  **Overall Severity Rating: [facility]. No gross pharyngeal residue noted post swallow indicating adequate pharyngeal pressure and laryngeal excursion during the swallow.   LARYNGEAL PENETRATION: (Material entering into the laryngeal inlet/vestibule but not aspirated): NONE ASPIRATION: NONE ESOPHAGEAL PHASE: (Screening of the upper esophagus): noted a min protrusion of  a bony-like osteophyte at C4-5. This did not appear to impede bolus flow. Pt has cervical fusion at the level of C5-7.    ASSESSMENT: Pt appears to present w/ No overt oropharyngeal phase dysphagia; No neuromuscular deficits of swallowing noted. No aspiration or laryngeal penetration of any trial consistency noted to occur during this study. Of note, an appearance of a min protrusion of a bony-like osteophyte was note at C4-5. This did not appear to impede bolus flow of any consistency. Pt has cervical fusion at the level of C5-7. This may correlate w/ pt's c/o difficulty w/ some solid foods(moreso meats, breads -- bulkier foods).   During the oral phase, timely bolus management and control of bolus propulsion for A-P transfer occurred. Min extra Time needed for full mastication of increased textured trials d/t lacking Dentition. Pt utilized lingual sweeping to clear oral cavity when needed. Oral clearing achieved w/ all trial consistencies. During the pharyngeal phase, Timely pharyngeal swallow initiation noted w/ all trial consistencies. No aspiration or laryngeal penetration occurred; airway closure appeared timely, tight. No pharyngeal residue remained post swallow indicating adequate laryngeal excursion and pharyngeal pressure during the swallow.   Discussed results of MBSS, video viewed and questions answered. Discussed general food consistencies easier for mastication and clearing of the Cervical Esophagus; moistening foods and using Small bites. Recommended continue f/u w/ GI for further education and management of any Esophageal dysmotility, especially related to Sjogren's disease.     PLAN/RECOMMENDATIONS:  A. Diet: Regular diet w/ thin liquids; general aspiration and REFLUX precautions. Pills WHOLE in Puree if easier for swallowing.  B. Swallowing Precautions: general aspiration precautions  C. Recommended consultation to: continue f/u w/ GI; education on Sjogren's disease and any impact on  the Esophageal phase of swallowing; Reflux precautions  D. Therapy recommendations: NONE  E. Results and recommendations were discussed w/ pt; video viewed w/ pt and questions answered. Pt agreed.          Other dysphagia - Plan: DG SWALLOW FUNC SPEECH PATH, DG SWALLOW [facility] SPEECH PATH        Problem List Patient Active Problem List   Diagnosis Date Noted   Acute midline low back pain with left-sided sciatica 01/12/2021   Chronic pain syndrome 01/12/2021   Abdominal pain    Peroneal tendinitis, right leg    Constipation 08/14/2019   Multiple atypical nevi 08/14/2019   Haglund's deformity of right heel 06/25/2019   Concussion 06/21/2019   Acute right ankle pain 03/21/2019   Hypothyroidism 03/21/2019   Sore throat 01/23/2019   Acute otitis media 01/23/2019   Cough in adult 12/20/2018   Healthcare maintenance 10/23/2018   Chronic joint pain 10/23/2018   Breast cancer screening 10/23/2018   DDD (degenerative disc disease), lumbar 10/23/2018   Muscle pain, fibromyalgia 10/23/2018       Orinda Kenner, MS, CCC-SLP Speech Language Pathologist Rehab Services [phone] [facility] 01/29/2021, 4:57 PM  [facility] [place], [place], [address] Phone: [phone]   Fax:     Name:  Pamela Esparza MRN: [id number] Date of Birth: [birth year]

## 2021-02-02 DIAGNOSIS — M545 Low back pain, unspecified: Secondary | ICD-10-CM | POA: Diagnosis not present

## 2021-02-02 DIAGNOSIS — M47816 Spondylosis without myelopathy or radiculopathy, lumbar region: Secondary | ICD-10-CM | POA: Diagnosis not present

## 2021-02-02 DIAGNOSIS — Z6841 Body Mass Index (BMI) 40.0 and over, adult: Secondary | ICD-10-CM | POA: Insufficient documentation

## 2021-02-02 DIAGNOSIS — R413 Other amnesia: Secondary | ICD-10-CM | POA: Diagnosis not present

## 2021-02-02 DIAGNOSIS — G8929 Other chronic pain: Secondary | ICD-10-CM | POA: Diagnosis not present

## 2021-02-02 DIAGNOSIS — M797 Fibromyalgia: Secondary | ICD-10-CM | POA: Diagnosis not present

## 2021-02-02 DIAGNOSIS — H9319 Tinnitus, unspecified ear: Secondary | ICD-10-CM | POA: Diagnosis not present

## 2021-02-02 DIAGNOSIS — I10 Essential (primary) hypertension: Secondary | ICD-10-CM | POA: Insufficient documentation

## 2021-02-04 DIAGNOSIS — M47816 Spondylosis without myelopathy or radiculopathy, lumbar region: Secondary | ICD-10-CM | POA: Insufficient documentation

## 2021-02-05 ENCOUNTER — Other Ambulatory Visit: Payer: Self-pay | Admitting: Physician Assistant

## 2021-02-05 DIAGNOSIS — M542 Cervicalgia: Secondary | ICD-10-CM | POA: Diagnosis not present

## 2021-02-05 DIAGNOSIS — Z79891 Long term (current) use of opiate analgesic: Secondary | ICD-10-CM | POA: Diagnosis not present

## 2021-02-05 DIAGNOSIS — G5721 Lesion of femoral nerve, right lower limb: Secondary | ICD-10-CM | POA: Diagnosis not present

## 2021-02-05 DIAGNOSIS — M545 Low back pain, unspecified: Secondary | ICD-10-CM | POA: Diagnosis not present

## 2021-02-05 DIAGNOSIS — M4802 Spinal stenosis, cervical region: Secondary | ICD-10-CM | POA: Diagnosis not present

## 2021-02-05 DIAGNOSIS — M47812 Spondylosis without myelopathy or radiculopathy, cervical region: Secondary | ICD-10-CM | POA: Diagnosis not present

## 2021-02-05 DIAGNOSIS — M5030 Other cervical disc degeneration, unspecified cervical region: Secondary | ICD-10-CM | POA: Diagnosis not present

## 2021-02-05 DIAGNOSIS — M961 Postlaminectomy syndrome, not elsewhere classified: Secondary | ICD-10-CM | POA: Diagnosis not present

## 2021-02-05 DIAGNOSIS — M7918 Myalgia, other site: Secondary | ICD-10-CM | POA: Diagnosis not present

## 2021-02-05 DIAGNOSIS — G8929 Other chronic pain: Secondary | ICD-10-CM | POA: Diagnosis not present

## 2021-02-05 DIAGNOSIS — M170 Bilateral primary osteoarthritis of knee: Secondary | ICD-10-CM | POA: Diagnosis not present

## 2021-02-05 DIAGNOSIS — M461 Sacroiliitis, not elsewhere classified: Secondary | ICD-10-CM | POA: Diagnosis not present

## 2021-02-05 DIAGNOSIS — M5136 Other intervertebral disc degeneration, lumbar region: Secondary | ICD-10-CM | POA: Diagnosis not present

## 2021-02-05 DIAGNOSIS — M47816 Spondylosis without myelopathy or radiculopathy, lumbar region: Secondary | ICD-10-CM | POA: Diagnosis not present

## 2021-02-05 DIAGNOSIS — M533 Sacrococcygeal disorders, not elsewhere classified: Secondary | ICD-10-CM | POA: Diagnosis not present

## 2021-02-09 ENCOUNTER — Other Ambulatory Visit: Payer: Self-pay | Admitting: Physician Assistant

## 2021-02-09 DIAGNOSIS — Z79899 Other long term (current) drug therapy: Secondary | ICD-10-CM

## 2021-02-09 DIAGNOSIS — E039 Hypothyroidism, unspecified: Secondary | ICD-10-CM

## 2021-02-09 DIAGNOSIS — E7800 Pure hypercholesterolemia, unspecified: Secondary | ICD-10-CM

## 2021-02-09 DIAGNOSIS — Z1159 Encounter for screening for other viral diseases: Secondary | ICD-10-CM

## 2021-02-09 DIAGNOSIS — E559 Vitamin D deficiency, unspecified: Secondary | ICD-10-CM

## 2021-02-09 DIAGNOSIS — Z0000 Encounter for general adult medical examination without abnormal findings: Secondary | ICD-10-CM

## 2021-02-09 DIAGNOSIS — Z114 Encounter for screening for human immunodeficiency virus [HIV]: Secondary | ICD-10-CM

## 2021-02-10 ENCOUNTER — Ambulatory Visit: Payer: Federal, State, Local not specified - PPO | Admitting: Gastroenterology

## 2021-02-10 ENCOUNTER — Other Ambulatory Visit: Payer: Self-pay

## 2021-02-10 ENCOUNTER — Other Ambulatory Visit (INDEPENDENT_AMBULATORY_CARE_PROVIDER_SITE_OTHER): Payer: Federal, State, Local not specified - PPO

## 2021-02-10 DIAGNOSIS — Z23 Encounter for immunization: Secondary | ICD-10-CM

## 2021-02-11 ENCOUNTER — Other Ambulatory Visit: Payer: Federal, State, Local not specified - PPO

## 2021-02-11 DIAGNOSIS — E559 Vitamin D deficiency, unspecified: Secondary | ICD-10-CM | POA: Diagnosis not present

## 2021-02-11 DIAGNOSIS — Z114 Encounter for screening for human immunodeficiency virus [HIV]: Secondary | ICD-10-CM

## 2021-02-11 DIAGNOSIS — E7800 Pure hypercholesterolemia, unspecified: Secondary | ICD-10-CM

## 2021-02-11 DIAGNOSIS — Z1159 Encounter for screening for other viral diseases: Secondary | ICD-10-CM

## 2021-02-11 DIAGNOSIS — Z79899 Other long term (current) drug therapy: Secondary | ICD-10-CM

## 2021-02-11 DIAGNOSIS — Z0000 Encounter for general adult medical examination without abnormal findings: Secondary | ICD-10-CM | POA: Diagnosis not present

## 2021-02-11 DIAGNOSIS — E039 Hypothyroidism, unspecified: Secondary | ICD-10-CM

## 2021-02-12 LAB — COMPREHENSIVE METABOLIC PANEL
ALT: 22 IU/L (ref 0–32)
AST: 21 IU/L (ref 0–40)
Albumin/Globulin Ratio: 1.6 (ref 1.2–2.2)
Albumin: 4.2 g/dL (ref 3.8–4.9)
Alkaline Phosphatase: 114 IU/L (ref 44–121)
BUN/Creatinine Ratio: 18 (ref 9–23)
BUN: 14 mg/dL (ref 6–24)
Bilirubin Total: 0.5 mg/dL (ref 0.0–1.2)
CO2: 21 mmol/L (ref 20–29)
Calcium: 9.3 mg/dL (ref 8.7–10.2)
Chloride: 101 mmol/L (ref 96–106)
Creatinine, Ser: 0.76 mg/dL (ref 0.57–1.00)
Globulin, Total: 2.6 g/dL (ref 1.5–4.5)
Glucose: 111 mg/dL — ABNORMAL HIGH (ref 65–99)
Potassium: 4.7 mmol/L (ref 3.5–5.2)
Sodium: 140 mmol/L (ref 134–144)
Total Protein: 6.8 g/dL (ref 6.0–8.5)
eGFR: 92 mL/min/{1.73_m2} (ref >59)

## 2021-02-12 LAB — HIV ANTIBODY (ROUTINE TESTING W REFLEX): HIV Screen 4th Generation wRfx: NONREACTIVE

## 2021-02-12 LAB — LIPID PANEL
Chol/HDL Ratio: 2.1 ratio (ref 0.0–4.4)
Cholesterol, Total: 173 mg/dL (ref 100–199)
HDL: 84 mg/dL (ref >39)
LDL Chol Calc (NIH): 69 mg/dL (ref 0–99)
Triglycerides: 117 mg/dL (ref 0–149)
VLDL Cholesterol Cal: 20 mg/dL (ref 5–40)

## 2021-02-12 LAB — CBC

## 2021-02-12 LAB — HEPATITIS C ANTIBODY: Hep C Virus Ab: 0.1 s/co ratio (ref 0.0–0.9)

## 2021-02-12 LAB — TSH: TSH: 3.79 u[IU]/mL (ref 0.450–4.500)

## 2021-02-12 LAB — VITAMIN D 25 HYDROXY (VIT D DEFICIENCY, FRACTURES): Vit D, 25-Hydroxy: 51.3 ng/mL (ref 30.0–100.0)

## 2021-02-12 LAB — HEMOGLOBIN A1C
Est. average glucose Bld gHb Est-mCnc: 120 mg/dL
Hgb A1c MFr Bld: 5.8 % — ABNORMAL HIGH (ref 4.8–5.6)

## 2021-02-16 DIAGNOSIS — M3500 Sicca syndrome, unspecified: Secondary | ICD-10-CM | POA: Diagnosis not present

## 2021-02-16 DIAGNOSIS — M797 Fibromyalgia: Secondary | ICD-10-CM | POA: Diagnosis not present

## 2021-02-16 DIAGNOSIS — M7989 Other specified soft tissue disorders: Secondary | ICD-10-CM | POA: Diagnosis not present

## 2021-02-16 DIAGNOSIS — M150 Primary generalized (osteo)arthritis: Secondary | ICD-10-CM | POA: Diagnosis not present

## 2021-02-16 DIAGNOSIS — R21 Rash and other nonspecific skin eruption: Secondary | ICD-10-CM | POA: Diagnosis not present

## 2021-02-16 DIAGNOSIS — Z6841 Body Mass Index (BMI) 40.0 and over, adult: Secondary | ICD-10-CM | POA: Diagnosis not present

## 2021-02-17 ENCOUNTER — Ambulatory Visit
Admission: RE | Admit: 2021-02-17 | Discharge: 2021-02-17 | Disposition: A | Discharge status: Home / Self Care | Payer: Federal, State, Local not specified - PPO | Source: Ambulatory Visit | Attending: Physician Assistant | Admitting: Physician Assistant

## 2021-02-17 ENCOUNTER — Other Ambulatory Visit: Payer: Self-pay

## 2021-02-17 DIAGNOSIS — M545 Low back pain, unspecified: Secondary | ICD-10-CM | POA: Diagnosis not present

## 2021-02-17 DIAGNOSIS — M47816 Spondylosis without myelopathy or radiculopathy, lumbar region: Secondary | ICD-10-CM

## 2021-02-17 DIAGNOSIS — M48061 Spinal stenosis, lumbar region without neurogenic claudication: Secondary | ICD-10-CM | POA: Diagnosis not present

## 2021-02-25 DIAGNOSIS — M47816 Spondylosis without myelopathy or radiculopathy, lumbar region: Secondary | ICD-10-CM | POA: Diagnosis not present

## 2021-03-13 ENCOUNTER — Ambulatory Visit (INDEPENDENT_AMBULATORY_CARE_PROVIDER_SITE_OTHER): Payer: Self-pay | Admitting: Gastroenterology

## 2021-03-13 DIAGNOSIS — Z23 Encounter for immunization: Secondary | ICD-10-CM

## 2021-03-13 NOTE — Progress Notes (Signed)
Patient tolerated the Hep A and B vaccine with no side effects

## 2021-03-16 ENCOUNTER — Ambulatory Visit: Payer: Federal, State, Local not specified - PPO

## 2021-03-17 ENCOUNTER — Other Ambulatory Visit: Payer: Self-pay | Admitting: Physician Assistant

## 2021-03-18 DIAGNOSIS — Z79891 Long term (current) use of opiate analgesic: Secondary | ICD-10-CM | POA: Diagnosis not present

## 2021-03-18 DIAGNOSIS — M542 Cervicalgia: Secondary | ICD-10-CM | POA: Diagnosis not present

## 2021-03-18 DIAGNOSIS — M1710 Unilateral primary osteoarthritis, unspecified knee: Secondary | ICD-10-CM | POA: Diagnosis not present

## 2021-03-18 DIAGNOSIS — M5136 Other intervertebral disc degeneration, lumbar region: Secondary | ICD-10-CM | POA: Diagnosis not present

## 2021-03-18 DIAGNOSIS — M5030 Other cervical disc degeneration, unspecified cervical region: Secondary | ICD-10-CM | POA: Diagnosis not present

## 2021-03-18 DIAGNOSIS — M7918 Myalgia, other site: Secondary | ICD-10-CM | POA: Diagnosis not present

## 2021-03-18 DIAGNOSIS — M47816 Spondylosis without myelopathy or radiculopathy, lumbar region: Secondary | ICD-10-CM | POA: Diagnosis not present

## 2021-03-18 DIAGNOSIS — M792 Neuralgia and neuritis, unspecified: Secondary | ICD-10-CM | POA: Diagnosis not present

## 2021-03-18 DIAGNOSIS — M961 Postlaminectomy syndrome, not elsewhere classified: Secondary | ICD-10-CM | POA: Diagnosis not present

## 2021-03-18 DIAGNOSIS — G8929 Other chronic pain: Secondary | ICD-10-CM | POA: Diagnosis not present

## 2021-03-18 DIAGNOSIS — M545 Low back pain, unspecified: Secondary | ICD-10-CM | POA: Diagnosis not present

## 2021-03-18 DIAGNOSIS — Z5181 Encounter for therapeutic drug level monitoring: Secondary | ICD-10-CM | POA: Diagnosis not present

## 2021-03-18 DIAGNOSIS — M4802 Spinal stenosis, cervical region: Secondary | ICD-10-CM | POA: Diagnosis not present

## 2021-03-18 DIAGNOSIS — M461 Sacroiliitis, not elsewhere classified: Secondary | ICD-10-CM | POA: Diagnosis not present

## 2021-03-18 DIAGNOSIS — M47812 Spondylosis without myelopathy or radiculopathy, cervical region: Secondary | ICD-10-CM | POA: Diagnosis not present

## 2021-03-18 DIAGNOSIS — M533 Sacrococcygeal disorders, not elsewhere classified: Secondary | ICD-10-CM | POA: Diagnosis not present

## 2021-04-04 NOTE — Addendum Note (Signed)
Encounter addended by: Antonieta Iba, CCC-SLP on: 04/04/2021 11:44 AM  Actions taken: Clinical Note Signed

## 2021-04-04 NOTE — Addendum Note (Signed)
Encounter addended by: Antonieta Iba, CCC-SLP on: 04/04/2021 12:50 PM  Actions taken: Clinical Note Signed

## 2021-04-13 ENCOUNTER — Other Ambulatory Visit: Payer: Self-pay | Admitting: Physician Assistant

## 2021-04-16 ENCOUNTER — Other Ambulatory Visit: Payer: Self-pay

## 2021-04-16 ENCOUNTER — Ambulatory Visit (INDEPENDENT_AMBULATORY_CARE_PROVIDER_SITE_OTHER): Payer: Federal, State, Local not specified - PPO | Admitting: Physician Assistant

## 2021-04-16 ENCOUNTER — Encounter: Payer: Self-pay | Admitting: Physician Assistant

## 2021-04-16 VITALS — BP 132/84 | HR 80 | Temp 98.4°F | Ht 65.0 in | Wt 284.4 lb

## 2021-04-16 DIAGNOSIS — G894 Chronic pain syndrome: Secondary | ICD-10-CM | POA: Diagnosis not present

## 2021-04-16 DIAGNOSIS — E7800 Pure hypercholesterolemia, unspecified: Secondary | ICD-10-CM

## 2021-04-16 DIAGNOSIS — E039 Hypothyroidism, unspecified: Secondary | ICD-10-CM | POA: Diagnosis not present

## 2021-04-16 DIAGNOSIS — H9193 Unspecified hearing loss, bilateral: Secondary | ICD-10-CM | POA: Diagnosis not present

## 2021-04-16 DIAGNOSIS — Z6841 Body Mass Index (BMI) 40.0 and over, adult: Secondary | ICD-10-CM | POA: Diagnosis not present

## 2021-04-16 DIAGNOSIS — Z79899 Other long term (current) drug therapy: Secondary | ICD-10-CM | POA: Diagnosis not present

## 2021-04-16 DIAGNOSIS — M3500 Sicca syndrome, unspecified: Secondary | ICD-10-CM | POA: Insufficient documentation

## 2021-04-16 DIAGNOSIS — Z23 Encounter for immunization: Secondary | ICD-10-CM | POA: Diagnosis not present

## 2021-04-16 DIAGNOSIS — M797 Fibromyalgia: Secondary | ICD-10-CM | POA: Diagnosis not present

## 2021-04-16 NOTE — Assessment & Plan Note (Addendum)
-  Stable. -Continue gabapentin and duloxetine. -Will continue to monitor alongside rheumatology.

## 2021-04-16 NOTE — Assessment & Plan Note (Signed)
-  Last TSH wnl -Continue current medication regimen. -Rechecking thyroid labs today. Pending results will make medication adjustments if indicated.  

## 2021-04-16 NOTE — Assessment & Plan Note (Signed)
>>  ASSESSMENT AND PLAN FOR BODY MASS INDEX (BMI) 45.0-49.9, ADULT (HCC) WRITTEN ON 04/16/2021 11:37 AM BY ABONZA, MARITZA, PA-C  -Encourage to continue with diet changes and stay as active as possible (limited by chronic pain).

## 2021-04-16 NOTE — Assessment & Plan Note (Signed)
-  Encourage to continue with diet changes and stay as active as possible (limited by chronic pain).

## 2021-04-16 NOTE — Progress Notes (Signed)
Established Patient Office Visit  Subjective:  Patient ID: Pamela Esparza, female    DOB: 04-Feb-1966  Age: 55 y.o. MRN: [id number]  CC:  Chief Complaint  Patient presents with   Hypothyroidism   Hyperlipidemia    HPI Pamela Esparza presents for follow up on hyperlipidemia and hypothyroidism. Patient has c/o bilateral hearing loss. States has a difficult time hearing especially if there is background noise. Does report chronic intermittent right ear pain, has a history of trauma in that ear several years ago.  Hypothyroidism: Patient denies increased fatigue. Does report gradual hair thinning the past 5 years. Taking medication as directed.  Chronic pain syndrome: Patient states rheumatologist recently discontinued her baclofen and started her on hydroxychloroquine to help with arthritis in her hands. Continues to see her pain specialist, Dr. Humphrey Rolls.  HLD: Pt taking medication as directed without issues. Reports recently went to [place] to visit her mother and got off track with watching her diet and gained a few pounds but since returning has tried to get back on track and monitor carbohydrates and sugar.    Past Medical History:  Diagnosis Date   Anxiety    Arthritis    Asthma    Cataract    DDD (degenerative disc disease), lumbar    Depression    Depression    Phreesia 10/13/2020   Fibromyalgia    Hyperlipidemia    Joint pain    Neuromuscular disorder ([place])    Phreesia 10/13/2020   Neuropathy    Sjogren's disease ([place])    Thyroid disease     Past Surgical History:  Procedure Laterality Date   ABDOMINAL HYSTERECTOMY     APPENDECTOMY     CHOLECYSTECTOMY     ESOPHAGOGASTRODUODENOSCOPY (EGD) WITH PROPOFOL N/A 12/05/2020   Procedure: ESOPHAGOGASTRODUODENOSCOPY (EGD) WITH PROPOFOL;  Surgeon: Virgel Manifold, MD;  Location: [facility];  Service: Endoscopy;  Laterality: N/A;   finger sugery     NECK SURGERY     TENDON TRANSFER Right 11/06/2019   Procedure: PERONEAL  RECONSTRUCTION WITH PERONEAL LONGUS RIGHT LEG;  Surgeon: Newt Minion, MD;  Location: [place];  Service: Orthopedics;  Laterality: Right;   TUBAL LIGATION N/A    Phreesia 10/13/2020    Family History  Problem Relation Age of Onset   Cancer Mother    Heart attack Father    Hypertension Father    Diabetes Father    Birth defects Maternal Grandmother        ovarian   Depression Maternal Grandmother    Birth defects Paternal Grandmother        brain   Colon cancer Neg Hx    Colon polyps Neg Hx    Esophageal cancer Neg Hx    Stomach cancer Neg Hx    Rectal cancer Neg Hx     Social History   Socioeconomic History   Marital status: Married    Spouse name: Not on file   Number of children: Not on file   Years of education: Not on file   Highest education level: Not on file  Occupational History   Not on file  Tobacco Use   Smoking status: Never   Smokeless tobacco: Never  Vaping Use   Vaping Use: Never used  Substance and Sexual Activity   Alcohol use: Yes    Comment: 4 drinks a year-rare   Drug use: Never   Sexual activity: Not Currently  Other Topics Concern   Not on file  Social History Narrative   Not on file   Social Determinants of Health   Financial Resource Strain: Not on file  Food Insecurity: Not on file  Transportation Needs: Not on file  Physical Activity: Not on file  Stress: Not on file  Social Connections: Not on file  Intimate Partner Violence: Not on file    Outpatient Medications Prior to Visit  Medication Sig Dispense Refill   albuterol (VENTOLIN HFA) 108 (90 Base) MCG/ACT inhaler INHALE 1 TO 2 PUFFS INTO THE LUNGS EVERY 6 HOURS AS NEEDED 54 g 0   ALPRAZolam (XANAX) 0.5 MG tablet Take 0.5 mg by mouth daily as needed.     atorvastatin (LIPITOR) 10 MG tablet Take 1 tablet (10 mg total) by mouth daily. 90 tablet 1   Cholecalciferol (VITAMIN D3) 75 MCG (3000 UT) TABS Take 2,000 Int'l Units by mouth daily.     DULoxetine  (CYMBALTA) 60 MG capsule Take 120 mg by mouth daily.     fluticasone (FLONASE) 50 MCG/ACT nasal spray SHAKE LIQUID AND USE 1 SPRAY IN EACH NOSTRIL DAILY 16 g 3   gabapentin (NEURONTIN) 600 MG tablet TAKE 1 TABLET(600 MG) BY MOUTH THREE TIMES DAILY 90 tablet 0   levothyroxine (SYNTHROID) 50 MCG tablet Take 1 tablet (50 mcg total) by mouth daily before breakfast. 90 tablet 1   meloxicam (MOBIC) 15 MG tablet Take 15 mg by mouth.     Multiple Vitamins-Minerals (CENTRUM SILVER PO) Take by mouth.     nitroGLYCERIN (NITROSTAT) 0.4 MG SL tablet Place under the tongue.     oxyCODONE-acetaminophen (PERCOCET) 10-325 MG tablet Take 1 tablet by mouth 2 (two) times daily.     pilocarpine (SALAGEN) 5 MG tablet Take 5 mg by mouth 3 (three) times daily.     predniSONE (STERAPRED UNI-PAK 48 TAB) 10 MG (48) TBPK tablet 12 day taper - take by mouth as directed for 12 days 48 tablet 0   baclofen (LIORESAL) 10 MG tablet Take 10 mg by mouth 3 (three) times daily as needed.     No facility-administered medications prior to visit.    Allergies  Allergen Reactions   Aspirin Other (See Comments)    stomach upset stomach upset    Hydrocodone-Acetaminophen Other (See Comments)    made her sick made her sick    Codeine     Other reaction(s): Upset stomach (finding)   Lyrica [Pregabalin]    Topiramate     ROS Review of Systems Review of Systems:  A fourteen system review of systems was performed and found to be positive as per HPI.  Objective:    Physical Exam General:  Pleasant and cooperative, in no acute distresss Neuro:  Alert and oriented,  extra-ocular muscles intact  HEENT:  Normocephalic, atraumatic, neck supple Skin:  no gross rash, warm, pink. Cardiac:  RRR Respiratory:  Not using accessory muscles, speaking in full sentences- unlabored. Vascular:  Ext warm, no cyanosis apprec.; cap RF less 2 sec. Psych:  No HI/SI, judgement and insight good, Euthymic mood. Full Affect.  BP 132/84   Pulse  80   Temp 98.4 F (36.9 C)   Ht $R'5\' 5"'fo$  (1.651 m)   Wt 284 lb 6.4 oz (129 kg)   SpO2 96%   BMI 47.33 kg/m  Wt Readings from Last 3 Encounters:  04/16/21 284 lb 6.4 oz (129 kg)  01/16/21 273 lb 5.4 oz (124 kg)  01/08/21 274 lb 11.2 oz (124.6 kg)     Health Maintenance Due  Topic Date Due   COVID-19 Vaccine (1) Never done   Pneumococcal Vaccine 52-28 Years old (1 - PCV) Never done    There are no preventive care reminders to display for this patient.  Lab Results  Component Value Date   TSH 3.790 02/11/2021   Lab Results  Component Value Date   WBC CANCELED 02/11/2021   HGB 14.0 10/14/2020   HCT 41.5 10/14/2020   MCV 93 10/14/2020   PLT 353 10/14/2020   Lab Results  Component Value Date   NA 140 02/11/2021   K 4.7 02/11/2021   CO2 21 02/11/2021   GLUCOSE 111 (H) 02/11/2021   BUN 14 02/11/2021   CREATININE 0.76 02/11/2021   BILITOT 0.5 02/11/2021   ALKPHOS 114 02/11/2021   AST 21 02/11/2021   ALT 22 02/11/2021   PROT 6.8 02/11/2021   ALBUMIN 4.2 02/11/2021   CALCIUM 9.3 02/11/2021   EGFR 92 02/11/2021   Lab Results  Component Value Date   CHOL 173 02/11/2021   Lab Results  Component Value Date   HDL 84 02/11/2021   Lab Results  Component Value Date   LDLCALC 69 02/11/2021   Lab Results  Component Value Date   TRIG 117 02/11/2021   Lab Results  Component Value Date   CHOLHDL 2.1 02/11/2021   Lab Results  Component Value Date   HGBA1C 5.8 (H) 02/11/2021      Assessment & Plan:   Problem List Items Addressed This Visit       Endocrine   Hypothyroidism - Primary (Chronic)    -Last TSH wnl -Continue current medication regimen. -Rechecking thyroid labs today. Pending results will make medication adjustments if indicated.        Relevant Orders   TSH   T4, free     Other   Muscle pain, fibromyalgia    -Stable. -Continue gabapentin and duloxetine. -Will continue to monitor alongside rheumatology.       Chronic pain syndrome     -Patient is followed by pain management. -Reviewed recent labs/urine drug screen.       Body mass index (BMI) 45.0-49.9, adult (HCC)    -Encourage to continue with diet changes and stay as active as possible (limited by chronic pain).        Other Visit Diagnoses     Elevated LDL cholesterol level       Relevant Orders   Lipid Profile   Comp Met (CMET)   CBC w/Diff   On statin therapy       Need for shingles vaccine       Relevant Orders   Varicella-zoster vaccine IM (Shingrix) (Completed)   Bilateral hearing loss, unspecified hearing loss type       Relevant Orders   Ambulatory referral to Audiology       Elevated LDL cholesterol level: -Last lipid panel: total cholesterol 173, triglycerides 117, HDL 84, LDL 69 -Continue current medication regimen. Patient is fasting, will collect lipid panel and repeat hepatic function. -Encourage to continue with weight loss efforts and follow a low fat diet. -Will continue to monitor.  Bilateral hearing loss: -No cerumen impaction on exam, will place referral to audiology.    No orders of the defined types were placed in this encounter.   Follow-up: Return in about 6 months (around 10/17/2021) for CPE.    Lorrene Reid, PA-C

## 2021-04-16 NOTE — Assessment & Plan Note (Signed)
-  Patient is followed by pain management. -Reviewed recent labs/urine drug screen.

## 2021-04-16 NOTE — Patient Instructions (Signed)
https://www.nhlbi.nih.gov/files/docs/public/heart/dash_brief.pdf">  DASH Eating Plan DASH stands for Dietary Approaches to Stop Hypertension. The DASH eating plan is a healthy eating plan that has been shown to: Reduce high blood pressure (hypertension). Reduce your risk for type 2 diabetes, heart disease, and stroke. Help with weight loss. What are tips for following this plan? Reading food labels Check food labels for the amount of salt (sodium) per serving. Choose foods with less than 5 percent of the Daily Value of sodium. Generally, foods with less than 300 milligrams (mg) of sodium per serving fit into this eating plan. To find whole grains, look for the word "whole" as the first word in the ingredient list. Shopping Buy products labeled as "low-sodium" or "no salt added." Buy fresh foods. Avoid canned foods and pre-made or frozen meals. Cooking Avoid adding salt when cooking. Use salt-free seasonings or herbs instead of table salt or sea salt. Check with your health care provider or pharmacist before using salt substitutes. Do not fry foods. Cook foods using healthy methods such as baking, boiling, grilling, roasting, and broiling instead. Cook with heart-healthy oils, such as olive, canola, avocado, soybean, or sunflower oil. Meal planning  Eat a balanced diet that includes: 4 or more servings of fruits and 4 or more servings of vegetables each day. Try to fill one-half of your plate with fruits and vegetables. 6-8 servings of whole grains each day. Less than 6 oz (170 g) of lean meat, poultry, or fish each day. A 3-oz (85-g) serving of meat is about the same size as a deck of cards. One egg equals 1 oz (28 g). 2-3 servings of low-fat dairy each day. One serving is 1 cup (237 mL). 1 serving of nuts, seeds, or beans 5 times each week. 2-3 servings of heart-healthy fats. Healthy fats called omega-3 fatty acids are found in foods such as walnuts, flaxseeds, fortified milks, and eggs.  These fats are also found in cold-water fish, such as sardines, salmon, and mackerel. Limit how much you eat of: Canned or prepackaged foods. Food that is high in trans fat, such as some fried foods. Food that is high in saturated fat, such as fatty meat. Desserts and other sweets, sugary drinks, and other foods with added sugar. Full-fat dairy products. Do not salt foods before eating. Do not eat more than 4 egg yolks a week. Try to eat at least 2 vegetarian meals a week. Eat more home-cooked food and less restaurant, buffet, and fast food.  Lifestyle When eating at a restaurant, ask that your food be prepared with less salt or no salt, if possible. If you drink alcohol: Limit how much you use to: 0-1 drink a day for women who are not pregnant. 0-2 drinks a day for men. Be aware of how much alcohol is in your drink. In the [place] one drink equals one 12 oz bottle of beer (355 mL), one 5 oz glass of wine (148 mL), or one 1 oz glass of hard liquor (44 mL). General information Avoid eating more than 2,300 mg of salt a day. If you have hypertension, you may need to reduce your sodium intake to 1,500 mg a day. Work with your health care provider to maintain a healthy body weight or to lose weight. Ask what an ideal weight is for you. Get at least 30 minutes of exercise that causes your heart to beat faster (aerobic exercise) most days of the week. Activities may include walking, swimming, or biking. Work with your health care provider   or dietitian to adjust your eating plan to your individual calorie needs. What foods should I eat? Fruits All fresh, dried, or frozen fruit. Canned fruit in natural juice (without addedsugar). Vegetables Fresh or frozen vegetables (raw, steamed, roasted, or grilled). Low-sodium or reduced-sodium tomato and vegetable juice. Low-sodium or reduced-sodium tomatosauce and tomato paste. Low-sodium or reduced-sodium canned vegetables. Grains Whole-grain or  whole-wheat bread. Whole-grain or whole-wheat pasta. Brown rice. Oatmeal. Quinoa. Bulgur. Whole-grain and low-sodium cereals. Pita bread.Low-fat, low-sodium crackers. Whole-wheat flour tortillas. Meats and other proteins Skinless chicken or turkey. Ground chicken or turkey. Pork with fat trimmed off. Fish and seafood. Egg whites. Dried beans, peas, or lentils. Unsalted nuts, nut butters, and seeds. Unsalted canned beans. Lean cuts of beef with fat trimmed off. Low-sodium, lean precooked or cured meat, such as sausages or meatloaves. Dairy Low-fat (1%) or fat-free (skim) milk. Reduced-fat, low-fat, or fat-free cheeses. Nonfat, low-sodium ricotta or cottage cheese. Low-fat or nonfatyogurt. Low-fat, low-sodium cheese. Fats and oils Soft margarine without trans fats. Vegetable oil. Reduced-fat, low-fat, or light mayonnaise and salad dressings (reduced-sodium). Canola, safflower, olive, avocado, soybean, andsunflower oils. Avocado. Seasonings and condiments Herbs. Spices. Seasoning mixes without salt. Other foods Unsalted popcorn and pretzels. Fat-free sweets. The items listed above may not be a complete list of foods and beverages you can eat. Contact a dietitian for more information. What foods should I avoid? Fruits Canned fruit in a light or heavy syrup. Fried fruit. Fruit in cream or buttersauce. Vegetables Creamed or fried vegetables. Vegetables in a cheese sauce. Regular canned vegetables (not low-sodium or reduced-sodium). Regular canned tomato sauce and paste (not low-sodium or reduced-sodium). Regular tomato and vegetable juice(not low-sodium or reduced-sodium). Pickles. Olives. Grains Baked goods made with fat, such as croissants, muffins, or some breads. Drypasta or rice meal packs. Meats and other proteins Fatty cuts of meat. Ribs. Fried meat. Bacon. Bologna, salami, and other precooked or cured meats, such as sausages or meat loaves. Fat from the back of a pig (fatback). Bratwurst.  Salted nuts and seeds. Canned beans with added salt. Canned orsmoked fish. Whole eggs or egg yolks. Chicken or turkey with skin. Dairy Whole or 2% milk, cream, and half-and-half. Whole or full-fat cream cheese. Whole-fat or sweetened yogurt. Full-fat cheese. Nondairy creamers. Whippedtoppings. Processed cheese and cheese spreads. Fats and oils Butter. Stick margarine. Lard. Shortening. Ghee. Bacon fat. Tropical oils, suchas coconut, palm kernel, or palm oil. Seasonings and condiments Onion salt, garlic salt, seasoned salt, table salt, and sea salt. Worcestershire sauce. Tartar sauce. Barbecue sauce. Teriyaki sauce. Soy sauce, including reduced-sodium. Steak sauce. Canned and packaged gravies. Fish sauce. Oyster sauce. Cocktail sauce. Store-bought horseradish. Ketchup. Mustard. Meat flavorings and tenderizers. Bouillon cubes. Hot sauces. Pre-made or packaged marinades. Pre-made or packaged taco seasonings. Relishes. Regular saladdressings. Other foods Salted popcorn and pretzels. The items listed above may not be a complete list of foods and beverages you should avoid. Contact a dietitian for more information. Where to find more information National Heart, Lung, and Blood Institute: www.nhlbi.nih.gov American Heart Association: www.heart.org Academy of Nutrition and Dietetics: www.eatright.org National Kidney Foundation: www.kidney.org Summary The DASH eating plan is a healthy eating plan that has been shown to reduce high blood pressure (hypertension). It may also reduce your risk for type 2 diabetes, heart disease, and stroke. When on the DASH eating plan, aim to eat more fresh fruits and vegetables, whole grains, lean proteins, low-fat dairy, and heart-healthy fats. With the DASH eating plan, you should limit salt (sodium) intake to 2,300   mg a day. If you have hypertension, you may need to reduce your sodium intake to 1,500 mg a day. Work with your health care provider or dietitian to adjust  your eating plan to your individual calorie needs. This information is not intended to replace advice given to you by your health care provider. Make sure you discuss any questions you have with your healthcare provider. Document Revised: 08/17/2019 Document Reviewed: 08/17/2019 Elsevier Patient Education  2022 Elsevier Inc.  

## 2021-04-17 ENCOUNTER — Other Ambulatory Visit: Payer: Self-pay | Admitting: Physician Assistant

## 2021-04-17 LAB — COMPREHENSIVE METABOLIC PANEL
ALT: 19 IU/L (ref 0–32)
AST: 22 IU/L (ref 0–40)
Albumin/Globulin Ratio: 1.4 (ref 1.2–2.2)
Albumin: 4.4 g/dL (ref 3.8–4.9)
Alkaline Phosphatase: 115 IU/L (ref 44–121)
BUN/Creatinine Ratio: 16 (ref 9–23)
BUN: 12 mg/dL (ref 6–24)
Bilirubin Total: 0.6 mg/dL (ref 0.0–1.2)
CO2: 27 mmol/L (ref 20–29)
Calcium: 9.8 mg/dL (ref 8.7–10.2)
Chloride: 102 mmol/L (ref 96–106)
Creatinine, Ser: 0.73 mg/dL (ref 0.57–1.00)
Globulin, Total: 3.2 g/dL (ref 1.5–4.5)
Glucose: 108 mg/dL — ABNORMAL HIGH (ref 65–99)
Potassium: 5 mmol/L (ref 3.5–5.2)
Sodium: 145 mmol/L — ABNORMAL HIGH (ref 134–144)
Total Protein: 7.6 g/dL (ref 6.0–8.5)
eGFR: 96 mL/min/{1.73_m2} (ref >59)

## 2021-04-17 LAB — CBC WITH DIFFERENTIAL/PLATELET
Basophils Absolute: 0.1 10*3/uL (ref 0.0–0.2)
Basos: 1 %
EOS (ABSOLUTE): 0 10*3/uL (ref 0.0–0.4)
Eos: 0 %
Hematocrit: 40.3 % (ref 34.0–46.6)
Hemoglobin: 13.3 g/dL (ref 11.1–15.9)
Immature Grans (Abs): 0.1 10*3/uL (ref 0.0–0.1)
Immature Granulocytes: 1 %
Lymphocytes Absolute: 2.2 10*3/uL (ref 0.7–3.1)
Lymphs: 31 %
MCH: 30.2 pg (ref 26.6–33.0)
MCHC: 33 g/dL (ref 31.5–35.7)
MCV: 91 fL (ref 79–97)
Monocytes Absolute: 0.4 10*3/uL (ref 0.1–0.9)
Monocytes: 6 %
Neutrophils Absolute: 4.3 10*3/uL (ref 1.4–7.0)
Neutrophils: 61 %
Platelets: 282 10*3/uL (ref 150–450)
RBC: 4.41 x10E6/uL (ref 3.77–5.28)
RDW: 12.4 % (ref 11.7–15.4)
WBC: 7.1 10*3/uL (ref 3.4–10.8)

## 2021-04-17 LAB — LIPID PANEL
Chol/HDL Ratio: 2.8 ratio (ref 0.0–4.4)
Cholesterol, Total: 195 mg/dL (ref 100–199)
HDL: 70 mg/dL (ref >39)
LDL Chol Calc (NIH): 105 mg/dL — ABNORMAL HIGH (ref 0–99)
Triglycerides: 111 mg/dL (ref 0–149)
VLDL Cholesterol Cal: 20 mg/dL (ref 5–40)

## 2021-04-17 LAB — T4, FREE: Free T4: 1.07 ng/dL (ref 0.82–1.77)

## 2021-04-17 LAB — TSH: TSH: 5.11 u[IU]/mL — ABNORMAL HIGH (ref 0.450–4.500)

## 2021-04-20 ENCOUNTER — Telehealth: Payer: Self-pay | Admitting: Physician Assistant

## 2021-04-20 DIAGNOSIS — H9313 Tinnitus, bilateral: Secondary | ICD-10-CM

## 2021-04-20 DIAGNOSIS — H9193 Unspecified hearing loss, bilateral: Secondary | ICD-10-CM

## 2021-04-20 NOTE — Telephone Encounter (Signed)
Patient states her L ear is bothering her. States it sounds like wind is passing through. Requesting a referral to ENT.

## 2021-04-30 DIAGNOSIS — M545 Low back pain, unspecified: Secondary | ICD-10-CM | POA: Diagnosis not present

## 2021-04-30 DIAGNOSIS — Z79891 Long term (current) use of opiate analgesic: Secondary | ICD-10-CM | POA: Diagnosis not present

## 2021-04-30 DIAGNOSIS — M7918 Myalgia, other site: Secondary | ICD-10-CM | POA: Diagnosis not present

## 2021-04-30 DIAGNOSIS — M170 Bilateral primary osteoarthritis of knee: Secondary | ICD-10-CM | POA: Diagnosis not present

## 2021-04-30 DIAGNOSIS — G894 Chronic pain syndrome: Secondary | ICD-10-CM | POA: Diagnosis not present

## 2021-04-30 DIAGNOSIS — Z981 Arthrodesis status: Secondary | ICD-10-CM | POA: Diagnosis not present

## 2021-04-30 DIAGNOSIS — M47816 Spondylosis without myelopathy or radiculopathy, lumbar region: Secondary | ICD-10-CM | POA: Diagnosis not present

## 2021-04-30 DIAGNOSIS — M5030 Other cervical disc degeneration, unspecified cervical region: Secondary | ICD-10-CM | POA: Diagnosis not present

## 2021-04-30 DIAGNOSIS — M533 Sacrococcygeal disorders, not elsewhere classified: Secondary | ICD-10-CM | POA: Diagnosis not present

## 2021-04-30 DIAGNOSIS — M5136 Other intervertebral disc degeneration, lumbar region: Secondary | ICD-10-CM | POA: Diagnosis not present

## 2021-04-30 DIAGNOSIS — M961 Postlaminectomy syndrome, not elsewhere classified: Secondary | ICD-10-CM | POA: Diagnosis not present

## 2021-04-30 DIAGNOSIS — M5416 Radiculopathy, lumbar region: Secondary | ICD-10-CM | POA: Diagnosis not present

## 2021-04-30 DIAGNOSIS — M461 Sacroiliitis, not elsewhere classified: Secondary | ICD-10-CM | POA: Diagnosis not present

## 2021-04-30 DIAGNOSIS — G5721 Lesion of femoral nerve, right lower limb: Secondary | ICD-10-CM | POA: Diagnosis not present

## 2021-04-30 DIAGNOSIS — M4802 Spinal stenosis, cervical region: Secondary | ICD-10-CM | POA: Diagnosis not present

## 2021-04-30 DIAGNOSIS — M47812 Spondylosis without myelopathy or radiculopathy, cervical region: Secondary | ICD-10-CM | POA: Diagnosis not present

## 2021-04-30 DIAGNOSIS — G5720 Lesion of femoral nerve, unspecified lower limb: Secondary | ICD-10-CM | POA: Diagnosis not present

## 2021-05-07 ENCOUNTER — Ambulatory Visit: Payer: Federal, State, Local not specified - PPO | Attending: Audiologist | Admitting: Audiologist

## 2021-05-07 ENCOUNTER — Other Ambulatory Visit: Payer: Self-pay

## 2021-05-07 DIAGNOSIS — H903 Sensorineural hearing loss, bilateral: Secondary | ICD-10-CM | POA: Diagnosis not present

## 2021-05-07 NOTE — Procedures (Signed)
  Outpatient Audiology and [facility], [place]  [address] [phone]  AUDIOLOGICAL  EVALUATION  NAME: Pamela Esparza     DOB:   01/07/1966      MRN: [id number]                                                                                     DATE: 05/07/2021     REFERENT: Lorrene Reid, PA-C STATUS: Outpatient DIAGNOSIS: Asymmetric Sensorineural Hearing Loss Bilateral    History: [name] was seen for an audiological evaluation.  [name] is receiving a hearing evaluation due to concerns for a sudden change in hearing in her left ear. Two months ago she suddenly noticed a strange feeling in her left ear, her hearing became [place], and a train whistle tinnitus started. She also is having pain in the left ear and speech is unclear. Tinnitus present in both ears ever since she had a concussion which was two years ago. [name] has no history of noise exposure. When her left ear suddenly changed there was no precipitating illness, injury or change in health.  Medical history negative for a condition which is a risk factor for hearing loss. No other relevant case history reported.   Evaluation:  Otoscopy showed a clear view of the tympanic membranes, bilaterally Tympanometry results were consistent with normal middle ear pressure, bilaterally   Audiometric testing was completed using conventional audiometry with insert and supraural transducer. Speech Recognition Thresholds were  consistent with pure tone averages. Word Recognition was good in each ear at 40dB SL. Pure tone thresholds show normal sloping to moderate sensorineural hearing loss in the right ear and moderately severe rising to mild sensorineural  hearing in the left ear. Test results are consistent with asymmetric sensorineural hearing loss. See attached audiogram, was scanned under Media.   Results:  The test results were reviewed with Pamela Esparza. Due to the difference in her hearing and reported  symptoms she needs to see a specialist for a medical evaluation. Her hearing in the left ear should not be significantly different than the right. [name] reported understanding. This report and her hearing test will be mailed home and sent to the referring provider.    Recommendations: Amplification is necessary for both ears. Hearing aids can be purchased from a variety of locations. No hearing aid should be purchased until [name] has been medically evaluated by an ENT Physician. See provided list for locations in the [place]. Referral to ENT Physician necessary due to asymmetric hearing loss with sudden onset.  Lorrene Reid, PA-C please send a referral to Dr. Melony Overly and include this report and the audiogram with the referral.    Alfonse Alpers  Audiologist, Au.D., CCC-A 05/07/2021  2:59 PM  Cc: Lorrene Reid, PA-C

## 2021-05-11 ENCOUNTER — Other Ambulatory Visit: Payer: Self-pay | Admitting: Physician Assistant

## 2021-05-15 ENCOUNTER — Other Ambulatory Visit: Payer: Self-pay | Admitting: Physician Assistant

## 2021-06-02 ENCOUNTER — Other Ambulatory Visit: Payer: Self-pay

## 2021-06-02 ENCOUNTER — Ambulatory Visit (INDEPENDENT_AMBULATORY_CARE_PROVIDER_SITE_OTHER): Payer: Federal, State, Local not specified - PPO | Admitting: Otolaryngology

## 2021-06-02 DIAGNOSIS — R7989 Other specified abnormal findings of blood chemistry: Secondary | ICD-10-CM

## 2021-06-02 DIAGNOSIS — H9042 Sensorineural hearing loss, unilateral, left ear, with unrestricted hearing on the contralateral side: Secondary | ICD-10-CM | POA: Diagnosis not present

## 2021-06-02 DIAGNOSIS — H903 Sensorineural hearing loss, bilateral: Secondary | ICD-10-CM

## 2021-06-02 NOTE — Progress Notes (Signed)
HPI: Pamela Esparza is a 55 y.o. female who presents is referred by her PCP for evaluation of decreased hearing in the left ear.  Patient has had a previous history of recurrent ear infections.  She is also had ringing in her ears previously.  But about 2 months ago she developed a sudden decreased hearing in the left ear.  She stated that she went to bed and woke up in the morning with hearing loss in the left ear.  She denied any vertigo at that time although she has had history of vertigo in the past. She had audiologic testing performed at [facility] and on review of this it demonstrated perhaps a mild sensorineural hearing loss in the right ear and a large predominantly low-frequency sensorineural hearing loss in the left ear down to 60 dB.  At 2000 frequency and above the hearing in both ears with symmetric between 30 and 40 DB.  Past Medical History:  Diagnosis Date   Anxiety    Arthritis    Asthma    Cataract    DDD (degenerative disc disease), lumbar    Depression    Depression    Phreesia 10/13/2020   Fibromyalgia    Hyperlipidemia    Joint pain    Neuromuscular disorder ([place])    Phreesia 10/13/2020   Neuropathy    Sjogren's disease ([place])    Thyroid disease    Past Surgical History:  Procedure Laterality Date   ABDOMINAL HYSTERECTOMY     APPENDECTOMY     CHOLECYSTECTOMY     ESOPHAGOGASTRODUODENOSCOPY (EGD) WITH PROPOFOL N/A 12/05/2020   Procedure: ESOPHAGOGASTRODUODENOSCOPY (EGD) WITH PROPOFOL;  Surgeon: Virgel Manifold, MD;  Location: [facility] ENDOSCOPY;  Service: Endoscopy;  Laterality: N/A;   finger sugery     NECK SURGERY     TENDON TRANSFER Right 11/06/2019   Procedure: PERONEAL RECONSTRUCTION WITH PERONEAL LONGUS RIGHT LEG;  Surgeon: Newt Minion, MD;  Location: [place];  Service: Orthopedics;  Laterality: Right;   TUBAL LIGATION N/A    Phreesia 10/13/2020   Social History   Socioeconomic History   Marital status: Married    Spouse name:  Not on file   Number of children: Not on file   Years of education: Not on file   Highest education level: Not on file  Occupational History   Not on file  Tobacco Use   Smoking status: Never   Smokeless tobacco: Never  Vaping Use   Vaping Use: Never used  Substance and Sexual Activity   Alcohol use: Yes    Comment: 4 drinks a year-rare   Drug use: Never   Sexual activity: Not Currently  Other Topics Concern   Not on file  Social History Narrative   Not on file   Social Determinants of Health   Financial Resource Strain: Not on file  Food Insecurity: Not on file  Transportation Needs: Not on file  Physical Activity: Not on file  Stress: Not on file  Social Connections: Not on file   Family History  Problem Relation Age of Onset   Cancer Mother    Heart attack Father    Hypertension Father    Diabetes Father    Birth defects Maternal Grandmother        ovarian   Depression Maternal Grandmother    Birth defects Paternal Grandmother        brain   Colon cancer Neg Hx    Colon polyps Neg Hx  Esophageal cancer Neg Hx    Stomach cancer Neg Hx    Rectal cancer Neg Hx    Allergies  Allergen Reactions   Aspirin Other (See Comments)    stomach upset stomach upset    Hydrocodone-Acetaminophen Other (See Comments)    made her sick made her sick    Codeine     Other reaction(s): Upset stomach (finding)   Lyrica [Pregabalin]    Topiramate    Prior to Admission medications   Medication Sig Start Date End Date Taking? Authorizing Provider  albuterol (VENTOLIN HFA) 108 (90 Base) MCG/ACT inhaler INHALE 1 TO 2 PUFFS INTO THE LUNGS EVERY 6 HOURS AS NEEDED 04/17/21   Lorrene Reid, PA-C  ALPRAZolam (XANAX) 0.5 MG tablet Take 0.5 mg by mouth daily as needed.    [provider]  atorvastatin (LIPITOR) 10 MG tablet Take 1 tablet (10 mg total) by mouth daily. 10/14/20   Lorrene Reid, PA-C  Cholecalciferol (VITAMIN D3) 75 MCG (3000 UT) TABS Take 2,000 Int'l  Units by mouth daily.    [provider]  DULoxetine (CYMBALTA) 60 MG capsule Take 120 mg by mouth daily. 12/25/15   [provider]  fluticasone (FLONASE) 50 MCG/ACT nasal spray SHAKE LIQUID AND USE 1 SPRAY IN EACH NOSTRIL DAILY 03/21/19   Danford, Valetta Fuller D, NP  gabapentin (NEURONTIN) 600 MG tablet TAKE 1 TABLET(600 MG) BY MOUTH THREE TIMES DAILY 05/11/21   Lorrene Reid, PA-C  levothyroxine (SYNTHROID) 50 MCG tablet Take 1 tablet (50 mcg total) by mouth daily before breakfast. 10/14/20   Abonza, Maritza, PA-C  meloxicam (MOBIC) 15 MG tablet Take 15 mg by mouth. 11/11/15   [provider]  Multiple Vitamins-Minerals (CENTRUM SILVER PO) Take by mouth.    [provider]  nitroGLYCERIN (NITROSTAT) 0.4 MG SL tablet Place under the tongue.    [provider]  oxyCODONE-acetaminophen (PERCOCET) 10-325 MG tablet Take 1 tablet by mouth 2 (two) times daily. 09/18/18   [provider]  pilocarpine (SALAGEN) 5 MG tablet Take 5 mg by mouth 3 (three) times daily. 12/11/15   [provider]  predniSONE (STERAPRED UNI-PAK 48 TAB) 10 MG (48) TBPK tablet 12 day taper - take by mouth as directed for 12 days 01/08/21   Ronnell Freshwater, NP     Positive ROS: Otherwise negative  All other systems have been reviewed and were otherwise negative with the exception of those mentioned in the HPI and as above.  Physical Exam: Constitutional: Alert, well-appearing, no acute distress Ears: External ears without lesions or tenderness. Ear canals are clear bilaterally with intact, clear TMs bilaterally. Nasal: External nose without lesions. Septum with minimal deformity and mild rhinitis. Clear nasal passages with no signs of sinus infection. Oral: Lips and gums without lesions. Tongue and palate mucosa without lesions. Posterior oropharynx clear. Neck: No palpable adenopathy or masses Respiratory: Breathing comfortably  Skin: No facial/neck lesions or rash  noted.  Procedures  Assessment: Sudden left ear sensorineural hearing loss in the lower frequencies that occurred about 2 months ago.  Plan: Reviewed with the patient as well as her husband concerning the sensorineural hearing loss in the left ear which is cochlear or retrocochlear pathology.  Could have been secondary to a vascular event or infectious etiology versus other. We will plan on scheduling an MRI scan with contrast to evaluate for cochlear or retrocochlear pathology.  Following results of the scan further treatment of this would require use of hearing aid. Patient will call us  following the MRI scan concerning results.   Radene Journey, MD   CC:

## 2021-06-03 ENCOUNTER — Other Ambulatory Visit: Payer: Federal, State, Local not specified - PPO

## 2021-06-03 DIAGNOSIS — R7989 Other specified abnormal findings of blood chemistry: Secondary | ICD-10-CM | POA: Diagnosis not present

## 2021-06-03 DIAGNOSIS — E039 Hypothyroidism, unspecified: Secondary | ICD-10-CM | POA: Diagnosis not present

## 2021-06-04 LAB — TSH: TSH: 4.06 u[IU]/mL (ref 0.450–4.500)

## 2021-06-09 ENCOUNTER — Encounter (INDEPENDENT_AMBULATORY_CARE_PROVIDER_SITE_OTHER): Payer: Self-pay

## 2021-06-11 ENCOUNTER — Other Ambulatory Visit (INDEPENDENT_AMBULATORY_CARE_PROVIDER_SITE_OTHER): Payer: Self-pay | Admitting: Otolaryngology

## 2021-06-11 DIAGNOSIS — H9042 Sensorineural hearing loss, unilateral, left ear, with unrestricted hearing on the contralateral side: Secondary | ICD-10-CM

## 2021-06-19 DIAGNOSIS — M797 Fibromyalgia: Secondary | ICD-10-CM | POA: Diagnosis not present

## 2021-06-19 DIAGNOSIS — M7989 Other specified soft tissue disorders: Secondary | ICD-10-CM | POA: Diagnosis not present

## 2021-06-19 DIAGNOSIS — M3500 Sicca syndrome, unspecified: Secondary | ICD-10-CM | POA: Diagnosis not present

## 2021-06-19 DIAGNOSIS — M150 Primary generalized (osteo)arthritis: Secondary | ICD-10-CM | POA: Diagnosis not present

## 2021-06-19 DIAGNOSIS — R21 Rash and other nonspecific skin eruption: Secondary | ICD-10-CM | POA: Diagnosis not present

## 2021-06-19 DIAGNOSIS — Z6841 Body Mass Index (BMI) 40.0 and over, adult: Secondary | ICD-10-CM | POA: Diagnosis not present

## 2021-07-01 ENCOUNTER — Other Ambulatory Visit: Payer: Self-pay | Admitting: Physician Assistant

## 2021-07-02 ENCOUNTER — Ambulatory Visit
Admission: RE | Admit: 2021-07-02 | Discharge: 2021-07-02 | Disposition: A | Discharge status: Home / Self Care | Payer: Federal, State, Local not specified - PPO | Source: Ambulatory Visit | Attending: Otolaryngology | Admitting: Otolaryngology

## 2021-07-02 DIAGNOSIS — E237 Disorder of pituitary gland, unspecified: Secondary | ICD-10-CM | POA: Diagnosis not present

## 2021-07-02 DIAGNOSIS — H9042 Sensorineural hearing loss, unilateral, left ear, with unrestricted hearing on the contralateral side: Secondary | ICD-10-CM

## 2021-07-02 DIAGNOSIS — R9082 White matter disease, unspecified: Secondary | ICD-10-CM | POA: Diagnosis not present

## 2021-07-02 MED ORDER — GADOBENATE DIMEGLUMINE 529 MG/ML IV SOLN
20.0000 mL | Freq: Once | INTRAVENOUS | Status: AC | PRN
  Administered 2021-07-02: 20 mL via INTRAVENOUS

## 2021-07-06 ENCOUNTER — Telehealth (INDEPENDENT_AMBULATORY_CARE_PROVIDER_SITE_OTHER): Payer: Self-pay | Admitting: Otolaryngology

## 2021-07-06 NOTE — Telephone Encounter (Signed)
Patient called today concerning results of her recent temporal bone MRI scan that was performed to evaluate left ear sensorineural hearing loss. I reviewed that MRI scan did not show any CP angle mass or internal auditory canal mass or tumor involving the auditory nerve to account for hearing loss.  Reviewed with her that the treatment of her hearing loss in the left ear would be use of a hearing aid. The MRI scan also showed a 6 mm round hypoenhancing mass in the left side of the pituitary gland suspicious for pituitary microadenoma. I reviewed with her concerning the possible pituitary microadenoma should be evaluated by endocrinology and neurosurgery and will arrange referrals to them.

## 2021-07-13 ENCOUNTER — Other Ambulatory Visit: Payer: Self-pay | Admitting: Physician Assistant

## 2021-07-13 DIAGNOSIS — E039 Hypothyroidism, unspecified: Secondary | ICD-10-CM

## 2021-07-14 ENCOUNTER — Other Ambulatory Visit: Payer: Self-pay | Admitting: Physician Assistant

## 2021-07-14 DIAGNOSIS — E7800 Pure hypercholesterolemia, unspecified: Secondary | ICD-10-CM

## 2021-07-20 ENCOUNTER — Telehealth (INDEPENDENT_AMBULATORY_CARE_PROVIDER_SITE_OTHER): Payer: Self-pay | Admitting: Otolaryngology

## 2021-07-20 NOTE — Telephone Encounter (Signed)
Diamonds called last week concerning my recommendations for follow-up of her incidental finding of pituitary mass on recent MRI scan.  She has an appointment to see neurosurgery but has not heard from endocrinology yet. I called [facility] this morning and gave them information for a consult to see Nyelle concerning her recently found pituitary mass noted on recent MRI scan for work-up of asymmetric sensorineural hearing loss.

## 2021-07-21 ENCOUNTER — Other Ambulatory Visit: Payer: Self-pay | Admitting: Physician Assistant

## 2021-07-21 DIAGNOSIS — H903 Sensorineural hearing loss, bilateral: Secondary | ICD-10-CM | POA: Diagnosis not present

## 2021-07-21 DIAGNOSIS — H9313 Tinnitus, bilateral: Secondary | ICD-10-CM | POA: Diagnosis not present

## 2021-07-21 DIAGNOSIS — E7800 Pure hypercholesterolemia, unspecified: Secondary | ICD-10-CM

## 2021-07-23 DIAGNOSIS — M50323 Other cervical disc degeneration at C6-C7 level: Secondary | ICD-10-CM | POA: Diagnosis not present

## 2021-07-23 DIAGNOSIS — G894 Chronic pain syndrome: Secondary | ICD-10-CM | POA: Diagnosis not present

## 2021-07-23 DIAGNOSIS — M961 Postlaminectomy syndrome, not elsewhere classified: Secondary | ICD-10-CM | POA: Diagnosis not present

## 2021-07-23 DIAGNOSIS — M7918 Myalgia, other site: Secondary | ICD-10-CM | POA: Diagnosis not present

## 2021-07-23 DIAGNOSIS — M170 Bilateral primary osteoarthritis of knee: Secondary | ICD-10-CM | POA: Diagnosis not present

## 2021-07-23 DIAGNOSIS — M461 Sacroiliitis, not elsewhere classified: Secondary | ICD-10-CM | POA: Diagnosis not present

## 2021-07-23 DIAGNOSIS — M5136 Other intervertebral disc degeneration, lumbar region: Secondary | ICD-10-CM | POA: Diagnosis not present

## 2021-07-23 DIAGNOSIS — M4802 Spinal stenosis, cervical region: Secondary | ICD-10-CM | POA: Diagnosis not present

## 2021-07-23 DIAGNOSIS — M47812 Spondylosis without myelopathy or radiculopathy, cervical region: Secondary | ICD-10-CM | POA: Diagnosis not present

## 2021-07-23 DIAGNOSIS — M47816 Spondylosis without myelopathy or radiculopathy, lumbar region: Secondary | ICD-10-CM | POA: Diagnosis not present

## 2021-07-23 DIAGNOSIS — G5791 Unspecified mononeuropathy of right lower limb: Secondary | ICD-10-CM | POA: Diagnosis not present

## 2021-07-23 DIAGNOSIS — M533 Sacrococcygeal disorders, not elsewhere classified: Secondary | ICD-10-CM | POA: Diagnosis not present

## 2021-07-23 DIAGNOSIS — M545 Low back pain, unspecified: Secondary | ICD-10-CM | POA: Diagnosis not present

## 2021-07-23 DIAGNOSIS — Z79891 Long term (current) use of opiate analgesic: Secondary | ICD-10-CM | POA: Diagnosis not present

## 2021-08-04 DIAGNOSIS — R5383 Other fatigue: Secondary | ICD-10-CM | POA: Diagnosis not present

## 2021-08-04 DIAGNOSIS — R413 Other amnesia: Secondary | ICD-10-CM | POA: Diagnosis not present

## 2021-08-04 DIAGNOSIS — M797 Fibromyalgia: Secondary | ICD-10-CM | POA: Diagnosis not present

## 2021-08-04 DIAGNOSIS — G4710 Hypersomnia, unspecified: Secondary | ICD-10-CM | POA: Diagnosis not present

## 2021-08-13 ENCOUNTER — Ambulatory Visit: Payer: Federal, State, Local not specified - PPO

## 2021-09-03 ENCOUNTER — Other Ambulatory Visit: Payer: Self-pay

## 2021-09-03 ENCOUNTER — Ambulatory Visit (INDEPENDENT_AMBULATORY_CARE_PROVIDER_SITE_OTHER): Payer: Federal, State, Local not specified - PPO | Admitting: Physician Assistant

## 2021-09-03 ENCOUNTER — Encounter: Payer: Self-pay | Admitting: Physician Assistant

## 2021-09-03 VITALS — BP 137/84 | HR 78 | Temp 97.3°F | Ht 65.0 in | Wt 298.1 lb

## 2021-09-03 DIAGNOSIS — M7989 Other specified soft tissue disorders: Secondary | ICD-10-CM

## 2021-09-03 DIAGNOSIS — M799 Soft tissue disorder, unspecified: Secondary | ICD-10-CM | POA: Insufficient documentation

## 2021-09-03 DIAGNOSIS — M1991 Primary osteoarthritis, unspecified site: Secondary | ICD-10-CM | POA: Insufficient documentation

## 2021-09-03 DIAGNOSIS — M79606 Pain in leg, unspecified: Secondary | ICD-10-CM

## 2021-09-03 DIAGNOSIS — M3500 Sicca syndrome, unspecified: Secondary | ICD-10-CM | POA: Insufficient documentation

## 2021-09-03 NOTE — Patient Instructions (Addendum)
Peripheral Vascular Disease Peripheral vascular disease (PVD) is a disease of the blood vessels. PVD may also be called peripheral artery disease (PAD) or poor circulation. PVD is the blocking or hardening of the arteries anywhere within the circulatory system beyond the heart. This can result in a decreased supply of blood to the arms, legs, and internal organs, such as the stomach or kidneys. However, PVD most often affects a person's lower legs and feet. Without treatment, PVD often worsens. PVD can lead to acute limb ischemia. This occurs when an arm or leg suddenly has trouble getting enough blood. This is a medical emergency. What are the causes? The most common cause of PVD is atherosclerosis. This is a buildup of fatty material and other substances (plaque)inside your arteries. Pieces of plaque can break off from the walls of an artery and become stuck in a smaller artery, blocking blood flow and possibly causing acute limb ischemia. Other common causes of PVD include: Blood clots that form inside the blood vessels. Injuries to blood vessels. Diseases that cause inflammation of blood vessels or cause blood vessel tightening (spasms). What increases the risk? The following factors may make you more likely to develop this condition: A family history of PVD. Common medical conditions, including: High cholesterol. Diabetes. High blood pressure (hypertension). Heart disease. Known atherosclerotic disease in another area of the body. Past injury, such as burns or a broken bone. Other medical conditions, such as: Buerger's disease. This is caused by inflamed blood vessels in your hands and feet. Some forms of arthritis. Birth defects that affect the arteries in your legs. Kidney disease. Using tobacco and nicotine products. Not getting enough exercise. Obesity. Being age 2 or older, or being age 57 or older and having the other risk factors. What are the signs or symptoms? This  condition may cause different symptoms. Your symptoms depend on what body part is not getting enough blood. Common signs and symptoms include: Cramps in your buttocks, legs, and feet. Intermittent claudication. This is pain and weakness in your legs during activity that resolves with rest. Leg pain at rest and leg numbness, tingling, or weakness. Coldness in a leg or foot, especially when compared to the other leg or foot. Skin or hair changes. These can include: Hair loss. Shiny skin. Pale or bluish skin. Thick toenails. Inability to get or maintain an erection (erectile dysfunction). Tiredness (fatigue). Weak pulse or no pulse in the feet. People with PVD are more likely to develop open wounds (ulcers) and sores on their toes, feet, or legs. The ulcers or sores may take longer than normal to heal. How is this diagnosed? PVD is diagnosed based on your signs and symptoms, a physical exam, and your medical history. You may also have other tests to find the cause. Tests include: Ankle-brachial index test.This test compares the blood pressure readings of the legs and arms. This may also include an exercise ankle-brachial index test in which you walk on a treadmill to check your symptoms. Doppler ultrasound. This takes pictures of blood flow through your blood vessels. Imaging studies that use dye to show blood flow. These are: CT angiogram. Magnetic resonance angiogram, or MRA. How is this treated? Treatment for PVD depends on the cause of your condition, how severe your symptoms are, and your age. Underlying causes need to be treated and controlled. These include long-term (chronic) conditions, such as diabetes, high cholesterol, and hypertension. Treatment may include: Lifestyle changes, such as: Quitting tobacco use. Exercising regularly. Following a low-fat,  low-cholesterol diet. Not drinking alcohol. Taking medicines, such as: Blood thinners to prevent blood clots. Medicines to  improve blood flow. Medicines to improve cholesterol levels. Procedures, such as: Angioplasty. This uses an inflated balloon to open a blocked artery and improve blood flow. Stent implant. This inserts a small mesh tube to keep a blocked artery open. Peripheral bypass surgery. This reroutes blood flow around a blocked artery. Surgery to remove dead tissue from an infected wound (debridement). Amputation. This is surgical removal of the affected limb. It may be necessary in cases of acute limb ischemia when medical or surgical treatments have not helped. Follow these instructions at home: Medicines Take over-the-counter and prescription medicines only as told by your health care provider. If you are taking blood thinners: Talk with your health care provider before you take any medicines that contain aspirin or NSAIDs, such as ibuprofen. These medicines increase your risk for dangerous bleeding. Take your medicine exactly as told, at the same time every day. Avoid activities that could cause injury or bruising, and follow instructions about how to prevent falls. Wear a medical alert bracelet or carry a card that lists what medicines you take. Lifestyle   Exercise regularly. Ask your health care provider about some good activities for you. Talk with your health care provider about maintaining a healthy weight. If needed, ask about losing weight. Eat a diet that is low in fat and cholesterol. If you need help, talk with your health care provider. Do not drink alcohol. Do not use any products that contain nicotine or tobacco. These products include cigarettes, chewing tobacco, and vaping devices, such as e-cigarettes. If you need help quitting, ask your health care provider. General instructions Take good care of your feet. To do this: Wear comfortable shoes that fit well. Check your feet often for any cuts or sores. Get an annual influenza vaccine. Keep all follow-up visits. This is  important. Where to find more information Society for Vascular Surgery: vascular.org American Heart Association: heart.org National Heart, Lung, and Blood Institute: https://www.hartman-hill.biz/ Contact a health care provider if: You have leg cramps while walking. You have leg pain when you rest. Your leg or foot feels cold. Your skin changes color. You have erectile dysfunction. You have cuts or sores on your legs or feet that do not heal. Get help right away if: You have sudden changes in color and feeling of your arms or legs, such as: Your arm or leg turns cold, numb, and blue. Your arm or leg becomes red, warm, swollen, painful, or numb. You have any symptoms of a stroke. "BE FAST" is an easy way to remember the main warning signs of a stroke: B - Balance. Signs are dizziness, sudden trouble walking, or loss of balance. E - Eyes. Signs are trouble seeing or a sudden change in vision. F - Face. Signs are sudden weakness or numbness of the face, or the face or eyelid drooping on one side. A - Arms. Signs are weakness or numbness in an arm. This happens suddenly and usually on one side of the body. S - Speech. Signs are sudden trouble speaking, slurred speech, or trouble understanding what people say. T - Time. Time to call emergency services. Write down what time symptoms started. You have other signs of a stroke, such as: A sudden, severe headache with no known cause. Nausea or vomiting. Seizure. You have chest pain or trouble breathing. These symptoms may represent a serious problem that is an emergency. Do not wait  to see if the symptoms will go away. Get medical help right away. Call your local emergency services (911 in the [place] Do not drive yourself to the hospital. Summary Peripheral vascular disease (PVD) is a disease of the blood vessels. PVD is the blocking or hardening of the arteries anywhere within the circulatory system beyond the heart. PVD may cause different symptoms. Your  symptoms depend on what part of your body is not getting enough blood. Treatment for PVD depends on what caused it, how severe your symptoms are, and your age. This information is not intended to replace advice given to you by your health care provider. Make sure you discuss any questions you have with your health care provider. Document Revised: 03/17/2020 Document Reviewed: 03/17/2020 Elsevier Patient Education  2022 [place]. Edema Edema is when you have too much fluid in your body or under your skin. Edema may make your legs, feet, and ankles swell. Swelling often happens in looser tissues, such as around your eyes. This is a common condition. It gets more common as you get older. There are many possible causes of edema. These include: Eating too much salt (sodium). Being on your feet or sitting for a long time. Certain medical conditions, such as: Pregnancy. Heart failure. Liver disease. Kidney disease. Cancer. Hot weather may make edema worse. Edema is usually painless. Your skin may look swollen or shiny. Follow these instructions at home: Medicines Take over-the-counter and prescription medicines only as told by your doctor. Your doctor may prescribe a medicine to help your body get rid of extra water (diuretic). Take this medicine if you are told to take it. Eating and drinking Eat a low-salt (low-sodium) diet as told by your doctor. Sometimes, eating less salt may reduce swelling. Depending on the cause of your swelling, you may need to limit how much fluid you drink (fluid restriction). General instructions Raise the injured area above the level of your heart while you are sitting or lying down. Do not sit still or stand for a long time. Do not wear tight clothes. Do not wear garters on your upper legs. Exercise your legs. This can help the swelling go down. Wear compression stockings as told by your doctor. It is important that these are the right size. These should be  prescribed by your doctor to prevent possible injuries. If elastic bandages or wraps are recommended, use them as told by your doctor. Contact a doctor if: Treatment is not working. You have heart, liver, or kidney disease and have symptoms of edema. You have sudden and unexplained weight gain. Get help right away if: You have shortness of breath or chest pain. You cannot breathe when you lie down. You have pain, redness, or warmth in the swollen areas. You have heart, liver, or kidney disease and get edema all of a sudden. You have a fever and your symptoms get worse all of a sudden. These symptoms may be an emergency. Get help right away. Call 911. Do not wait to see if the symptoms will go away. Do not drive yourself to the hospital. Summary Edema is when you have too much fluid in your body or under your skin. Edema may make your legs, feet, and ankles swell. Swelling often happens in looser tissues, such as around your eyes. Raise the injured area above the level of your heart while you are sitting or lying down. Follow your doctor's instructions about diet and how much fluid you can drink. This information is not  intended to replace advice given to you by your health care provider. Make sure you discuss any questions you have with your health care provider. Document Revised: 05/18/2021 Document Reviewed: 05/18/2021 Elsevier Patient Education  [place].

## 2021-09-03 NOTE — Progress Notes (Signed)
Established Patient Office Visit  Subjective:  Patient ID: Pamela Esparza, female    DOB: 07-30-1966  Age: 55 y.o. MRN: [id number]  CC:  Chief Complaint  Patient presents with   Leg Pain    HPI SONNET RIZOR presents for bilateral lower extremity swelling which has gradually worsened the past couple of months and also reports having pain of both legs. States pain is at rest and with walking. Also report noticing her toes especially big toe turning purple. Does have mild erythema of lower extremities. Denis chest pain, syncope, orthopnea, or palpitations. Patient is followed by several specialists and states her muscles and nerves have been checked which were normal. Has used compression socks which have helped some. Also elevated her legs and sometimes can take several hours or days to help with the swelling. Patient reports has chronic shortness of breath and dizziness.  Past Medical History:  Diagnosis Date   Anxiety    Arthritis    Asthma    Cataract    DDD (degenerative disc disease), lumbar    Depression    Depression    Phreesia 10/13/2020   Fibromyalgia    Hyperlipidemia    Joint pain    Neuromuscular disorder ([place])    Phreesia 10/13/2020   Neuropathy    Sjogren's disease ([place])    Thyroid disease     Past Surgical History:  Procedure Laterality Date   ABDOMINAL HYSTERECTOMY     APPENDECTOMY     CHOLECYSTECTOMY     ESOPHAGOGASTRODUODENOSCOPY (EGD) WITH PROPOFOL N/A 12/05/2020   Procedure: ESOPHAGOGASTRODUODENOSCOPY (EGD) WITH PROPOFOL;  Surgeon: Virgel Manifold, MD;  Location: [facility] ENDOSCOPY;  Service: Endoscopy;  Laterality: N/A;   finger sugery     NECK SURGERY     TENDON TRANSFER Right 11/06/2019   Procedure: PERONEAL RECONSTRUCTION WITH PERONEAL LONGUS RIGHT LEG;  Surgeon: Newt Minion, MD;  Location: [facility];  Service: Orthopedics;  Laterality: Right;   TUBAL LIGATION N/A    Phreesia 10/13/2020    Family History  Problem  Relation Age of Onset   Cancer Mother    Heart attack Father    Hypertension Father    Diabetes Father    Birth defects Maternal Grandmother        ovarian   Depression Maternal Grandmother    Birth defects Paternal Grandmother        brain   Colon cancer Neg Hx    Colon polyps Neg Hx    Esophageal cancer Neg Hx    Stomach cancer Neg Hx    Rectal cancer Neg Hx     Social History   Socioeconomic History   Marital status: Married    Spouse name: Not on file   Number of children: Not on file   Years of education: Not on file   Highest education level: Not on file  Occupational History   Not on file  Tobacco Use   Smoking status: Never   Smokeless tobacco: Never  Vaping Use   Vaping Use: Never used  Substance and Sexual Activity   Alcohol use: Yes    Comment: 4 drinks a year-rare   Drug use: Never   Sexual activity: Not Currently  Other Topics Concern   Not on file  Social History Narrative   Not on file   Social Determinants of Health   Financial Resource Strain: Not on file  Food Insecurity: Not on file  Transportation Needs: Not on file  Physical Activity:  Not on file  Stress: Not on file  Social Connections: Not on file  Intimate Partner Violence: Not on file    Outpatient Medications Prior to Visit  Medication Sig Dispense Refill   albuterol (VENTOLIN HFA) 108 (90 Base) MCG/ACT inhaler INHALE 1 TO 2 PUFFS INTO THE LUNGS EVERY 6 HOURS AS NEEDED 54 g 0   ALPRAZolam (XANAX) 0.5 MG tablet Take 0.5 mg by mouth daily as needed.     atorvastatin (LIPITOR) 10 MG tablet TAKE 1 TABLET(10 MG) BY MOUTH DAILY 90 tablet 1   Cholecalciferol (VITAMIN D3) 75 MCG (3000 UT) TABS Take 2,000 Int'l Units by mouth daily.     cyclobenzaprine (FLEXERIL) 5 MG tablet TAKE 1 TABLET BY MOUTH AT NIGHT FOR 1 WEEK THEN TAKE 1 TABLET BY MOUTH TWICE DAILY     DULoxetine (CYMBALTA) 60 MG capsule Take 120 mg by mouth daily.     fluticasone (FLONASE) 50 MCG/ACT nasal spray SHAKE LIQUID AND  USE 1 SPRAY IN EACH NOSTRIL DAILY 16 g 3   gabapentin (NEURONTIN) 600 MG tablet TAKE 1 TABLET(600 MG) BY MOUTH THREE TIMES DAILY 90 tablet 0   hydroxychloroquine (PLAQUENIL) 200 MG tablet Take 200 mg by mouth 2 (two) times daily.     levothyroxine (SYNTHROID) 50 MCG tablet TAKE 1 TABLET(50 MCG) BY MOUTH DAILY BEFORE AND BREAKFAST 90 tablet 1   meloxicam (MOBIC) 15 MG tablet Take 15 mg by mouth.     Multiple Vitamins-Minerals (CENTRUM SILVER PO) Take by mouth.     nitroGLYCERIN (NITROSTAT) 0.4 MG SL tablet Place under the tongue.     oxyCODONE-acetaminophen (PERCOCET) 10-325 MG tablet Take 1 tablet by mouth 2 (two) times daily.     pilocarpine (SALAGEN) 5 MG tablet Take 5 mg by mouth 3 (three) times daily.     predniSONE (STERAPRED UNI-PAK 48 TAB) 10 MG (48) TBPK tablet 12 day taper - take by mouth as directed for 12 days 48 tablet 0   No facility-administered medications prior to visit.    Allergies  Allergen Reactions   Aspirin Other (See Comments)    stomach upset stomach upset    Hydrocodone-Acetaminophen Other (See Comments)    made her sick made her sick    Codeine     Other reaction(s): Upset stomach (finding)   Lyrica [Pregabalin]    Topiramate     ROS Review of Systems Review of Systems:  A fourteen system review of systems was performed and found to be positive as per HPI.   Objective:    Physical Exam General:  Well Developed, well nourished, appropriate for stated age.  Neuro:  Alert and oriented,  extra-ocular muscles intact  HEENT:  Normocephalic, atraumatic, neck supple, no carotid bruits appreciated  Skin:  no gross rash, warm, pink. Cardiac:  RRR, S1 S2 Respiratory: CTA B/L, Not using accessory muscles, speaking in full sentences- unlabored. Vascular:  Decreased dorsal pedis of left foot appreciated. Non-pitting edema of both LE Psych:  No HI/SI, judgement and insight good, Euthymic mood. Full Affect.  BP 137/84   Pulse 78   Temp (!) 97.3 F (36.3 C)    Ht 5\' 5"  (1.651 m)   Wt 298 lb 1.6 oz (135.2 kg)   SpO2 99%   BMI 49.61 kg/m  Wt Readings from Last 3 Encounters:  09/03/21 298 lb 1.6 oz (135.2 kg)  04/16/21 284 lb 6.4 oz (129 kg)  01/16/21 273 lb 5.4 oz (124 kg)     Health Maintenance Due  Topic Date Due   Pneumococcal Vaccine 79-73 Years old (1 - PCV) Never done   COVID-19 Vaccine (3 - Moderna risk series) 04/30/2020   INFLUENZA VACCINE  Never done    There are no preventive care reminders to display for this patient.  Lab Results  Component Value Date   TSH 4.060 06/03/2021   Lab Results  Component Value Date   WBC 7.1 04/16/2021   HGB 13.3 04/16/2021   HCT 40.3 04/16/2021   MCV 91 04/16/2021   PLT 282 04/16/2021   Lab Results  Component Value Date   NA 145 (H) 04/16/2021   K 5.0 04/16/2021   CO2 27 04/16/2021   GLUCOSE 108 (H) 04/16/2021   BUN 12 04/16/2021   CREATININE 0.73 04/16/2021   BILITOT 0.6 04/16/2021   ALKPHOS 115 04/16/2021   AST 22 04/16/2021   ALT 19 04/16/2021   PROT 7.6 04/16/2021   ALBUMIN 4.4 04/16/2021   CALCIUM 9.8 04/16/2021   EGFR 96 04/16/2021   Lab Results  Component Value Date   CHOL 195 04/16/2021   Lab Results  Component Value Date   HDL 70 04/16/2021   Lab Results  Component Value Date   LDLCALC 105 (H) 04/16/2021   Lab Results  Component Value Date   TRIG 111 04/16/2021   Lab Results  Component Value Date   CHOLHDL 2.8 04/16/2021   Lab Results  Component Value Date   HGBA1C 5.8 (H) 02/11/2021      Assessment & Plan:   Problem List Items Addressed This Visit   None Visit Diagnoses     Pain and swelling of lower extremity, unspecified laterality    -  Primary   Relevant Orders   VAS [place] ABI WITH/WO TBI      Patient has signs and symptoms suggestive of claudication so will place order for vascular ultrasound with ABI for further evaluation.  Reviewed recent labs from 08/04/2021, no signs of nutritional deficiency.  11/14/2020 Echocardiogram  revealed left ventricular ejection fraction of 60 to 65%.  Recommend to continue with compression socks, elevation and low-sodium diet.  Patient has a history of Sjogren's and prefers to manage without diuretic therapy. If vascular ultrasound unremarkable, then will consider possible medication side effect and will hold atorvastatin for 4 weeks.   No orders of the defined types were placed in this encounter.   Follow-up: Return for as scheduled for CPE.    Lorrene Reid, PA-C

## 2021-09-09 ENCOUNTER — Encounter: Payer: Self-pay | Admitting: Internal Medicine

## 2021-09-09 ENCOUNTER — Other Ambulatory Visit: Payer: Self-pay

## 2021-09-09 ENCOUNTER — Ambulatory Visit (INDEPENDENT_AMBULATORY_CARE_PROVIDER_SITE_OTHER): Payer: Federal, State, Local not specified - PPO | Admitting: Internal Medicine

## 2021-09-09 VITALS — BP 140/84 | HR 68 | Ht 65.0 in | Wt 296.0 lb

## 2021-09-09 DIAGNOSIS — R2681 Unsteadiness on feet: Secondary | ICD-10-CM | POA: Diagnosis not present

## 2021-09-09 DIAGNOSIS — E039 Hypothyroidism, unspecified: Secondary | ICD-10-CM | POA: Diagnosis not present

## 2021-09-09 DIAGNOSIS — R7303 Prediabetes: Secondary | ICD-10-CM

## 2021-09-09 DIAGNOSIS — D352 Benign neoplasm of pituitary gland: Secondary | ICD-10-CM | POA: Diagnosis not present

## 2021-09-09 NOTE — Patient Instructions (Signed)
You are on levothyroxine - which is your thyroid hormone supplement. You MUST take this consistently.  You should take this first thing in the morning on an empty stomach with water. You should not take it with other medications. Wait 70min to 1hr prior to eating. If you are taking any vitamins - please take these in the evening.   If you miss a dose, please take your missed dose the following day (double the dose for that day). You should have a pill box for ONLY levothyroxine on your bedside table to help you remember to take your medications.    24-Hour Urine Collection  You will be collecting your urine for a 24-hour period of time. Your timer starts with your first urine of the morning (For example - If you first pee at [place], your timer will start at [place]) [place] away your first urine of the morning Collect your urine every time you pee for the next 24 hours STOP your urine collection 24 hours after you started the collection (For example - You would stop at 9AM the day after you started)

## 2021-09-09 NOTE — Progress Notes (Signed)
Name: Pamela Esparza  MRN/ DOB: [id number], 1966/04/21    Age/ Sex: 55 y.o., female    PCP: Lorrene Reid, PA-C   Reason for Endocrinology Evaluation: Pituitary microadenoma     Date of Initial Endocrinology Evaluation: 09/09/2021     HPI: Pamela Esparza is a 55 y.o. female with a past medical history of hypothyroid, Sjogren's syndrome, and HTN. The patient presented for initial endocrinology clinic visit on 09/09/2021 for consultative assistance with her pituitary microadenoma.   During evaluation for hearing loss through ENT the patient was noted to have 6 mm pituitary adenoma on MRI dated 07/02/2021   She was diagnosed with  hypothyroidism ~2020 . Has been on Lt-4 replacement since then      Weight has been trending up  Has occasional alternating bowel movement  Has occasional Palpitations  Has LE edema - is going for ABI tomorrow  Has excessive sweating  No prior dx of diabetes  Has bruising  Has OA of the knees and uses a cane as well as back  NO change in ring size , has hand swelling   Last prednisone dose was 12/2020 Has chronic headaches with visual changes      HISTORY:  Past Medical History:  Past Medical History:  Diagnosis Date   Anxiety    Arthritis    Asthma    Cataract    DDD (degenerative disc disease), lumbar    Depression    Depression    Phreesia 10/13/2020   Fibromyalgia    Hyperlipidemia    Joint pain    Neuromuscular disorder ([place])    Phreesia 10/13/2020   Neuropathy    Sjogren's disease ([place])    Thyroid disease    Past Surgical History:  Past Surgical History:  Procedure Laterality Date   ABDOMINAL HYSTERECTOMY     APPENDECTOMY     CHOLECYSTECTOMY     ESOPHAGOGASTRODUODENOSCOPY (EGD) WITH PROPOFOL N/A 12/05/2020   Procedure: ESOPHAGOGASTRODUODENOSCOPY (EGD) WITH PROPOFOL;  Surgeon: Virgel Manifold, MD;  Location: [facility] ENDOSCOPY;  Service: Endoscopy;  Laterality: N/A;   finger sugery     NECK SURGERY     TENDON  TRANSFER Right 11/06/2019   Procedure: PERONEAL RECONSTRUCTION WITH PERONEAL LONGUS RIGHT LEG;  Surgeon: Newt Minion, MD;  Location: [facility];  Service: Orthopedics;  Laterality: Right;   TUBAL LIGATION N/A    Phreesia 10/13/2020    Social History:  reports that she has never smoked. She has never used smokeless tobacco. She reports current alcohol use. She reports that she does not use drugs. Family History: family history includes Birth defects in her maternal grandmother and paternal grandmother; Cancer in her mother; Depression in her maternal grandmother; Diabetes in her father; Heart attack in her father; Hypertension in her father.   HOME MEDICATIONS: Allergies as of 09/09/2021       Reactions   Aspirin Other (See Comments)   stomach upset stomach upset   Hydrocodone-acetaminophen Other (See Comments)   made her sick made her sick   Codeine    Other reaction(s): Upset stomach (finding)   Lyrica [pregabalin]    Topiramate         Medication List        Accurate as of September 09, 2021  3:12 PM. If you have any questions, ask your nurse or doctor.          albuterol 108 (90 Base) MCG/ACT inhaler Commonly known as: VENTOLIN HFA INHALE 1 TO  2 PUFFS INTO THE LUNGS EVERY 6 HOURS AS NEEDED   ALPRAZolam 0.5 MG tablet Commonly known as: XANAX Take 0.5 mg by mouth daily as needed.   atorvastatin 10 MG tablet Commonly known as: LIPITOR TAKE 1 TABLET(10 MG) BY MOUTH DAILY   CENTRUM SILVER PO Take by mouth.   cyclobenzaprine 5 MG tablet Commonly known as: FLEXERIL TAKE 1 TABLET BY MOUTH AT NIGHT FOR 1 WEEK THEN TAKE 1 TABLET BY MOUTH TWICE DAILY   DULoxetine 60 MG capsule Commonly known as: CYMBALTA Take 120 mg by mouth daily.   fluticasone 50 MCG/ACT nasal spray Commonly known as: FLONASE SHAKE LIQUID AND USE 1 SPRAY IN EACH NOSTRIL DAILY   gabapentin 600 MG tablet Commonly known as: NEURONTIN TAKE 1 TABLET(600 MG) BY MOUTH THREE TIMES  DAILY   hydroxychloroquine 200 MG tablet Commonly known as: PLAQUENIL Take 200 mg by mouth 2 (two) times daily.   levothyroxine 50 MCG tablet Commonly known as: SYNTHROID TAKE 1 TABLET(50 MCG) BY MOUTH DAILY BEFORE AND BREAKFAST   meloxicam 15 MG tablet Commonly known as: MOBIC Take 15 mg by mouth.   nitroGLYCERIN 0.4 MG SL tablet Commonly known as: NITROSTAT Place under the tongue.   oxyCODONE-acetaminophen 10-325 MG tablet Commonly known as: PERCOCET Take 1 tablet by mouth 2 (two) times daily.   pilocarpine 5 MG tablet Commonly known as: SALAGEN Take 5 mg by mouth 3 (three) times daily.   predniSONE 10 MG (48) Tbpk tablet Commonly known as: STERAPRED UNI-PAK 48 TAB 12 day taper - take by mouth as directed for 12 days   Vitamin D3 75 MCG (3000 UT) Tabs Take 5,000 Int'l Units by mouth daily.          REVIEW OF SYSTEMS: A comprehensive ROS was conducted with the patient and is negative except as per HPI and below:  ROS     OBJECTIVE:  VS: BP 140/84 (BP Location: Left Arm, Patient Position: Sitting, Cuff Size: Large)    Pulse 68    Ht 5\' 5"  (1.651 m)    Wt 296 lb (134.3 kg)    SpO2 99%    BMI 49.26 kg/m    Wt Readings from Last 3 Encounters:  09/09/21 296 lb (134.3 kg)  09/03/21 298 lb 1.6 oz (135.2 kg)  04/16/21 284 lb 6.4 oz (129 kg)     EXAM: General: Pt appears well and is in NAD  Ears, Nose, Throat: Hearing: Grossly intact bilaterally Dental: Good dentition  Throat: Clear without mass, erythema or exudate  Neck: General: Supple without adenopathy. Thyroid: Thyroid size normal.  No goiter or nodules appreciated.   Lungs: Clear with good BS bilat with no rales, rhonchi, or wheezes  Heart: Auscultation: RRR.  Abdomen: Normoactive bowel sounds, soft, nontender, without masses or organomegaly palpable  Extremities:  BL LE: No pretibial edema normal ROM and strength.  Skin: Hair: Texture and amount normal with gender appropriate distribution Skin  Inspection: dark wide abdominal striae noted on abdominal wall  Skin Palpation: Skin temperature, texture, and thickness normal to palpation  Mental Status: Judgment, insight: Intact Orientation: Oriented to time, place, and person Mood and affect: No depression, anxiety, or agitation     DATA REVIEWED: 02/11/2021 A1c 5.8%   06/19/2021 BUN /CR 13/0.760     06/03/2021 TSH 4.060 uIU/mL    MRI 07/02/2021  FINDINGS: Brain:   Cerebral volume is normal.   Subtle ill-defined T2 FLAIR hyperintense signal abnormality anterior right frontal lobe white matter (series 6, image  17).   6 mm round hypoenhancing focus within the left aspect of the pituitary gland, highly suspicious for a pituitary microadenoma (series 14, image 24).   No cerebellopontine angle or internal auditory canal mass is demonstrated. Unremarkable appearance of the seventh and eighth cranial nerves bilaterally.   There is no acute infarct.   No chronic intracranial blood products.   No extra-axial fluid collection.   No midline shift.   No pathologic intracranial enhancement identified.   Vascular: Maintained flow voids within the proximal large arterial vessels.   Skull and upper cervical spine: No focal suspicious marrow lesion.   Sinuses/Orbits: Visualized orbits show no acute finding. Trace mucosal thickening within the bilateral ethmoid sinuses.   Other: Minimal fluid in the right mastoid air cells.   Impression #2 will be called to the ordering clinician or representative by the Radiologist Assistant, and communication documented in the PACS or [employer].   IMPRESSION: No cerebellopontine angle or internal auditory canal mass.   6 mm round hypoenhancing focus within the left aspect of the pituitary gland, highly suspicious for a pituitary microadenoma. Correlate with relevant endocrinologic laboratory values. Additionally, a contrast-enhanced pituitary protocol brain MRI may be  considered for further evaluation.   Subtle ill-defined T2 FLAIR hyperintense signal abnormality within the anterior right frontal lobe white matter, nonspecific but possibly reflecting minimal changes of chronic small vessel ischemia.   Trace fluid within the right mastoid air cells.    ASSESSMENT/PLAN/RECOMMENDATIONS:   Pituitary microadenoma:  -We discussed the pathophysiology of pituitary microadenoma's. -We are going to proceed with pituitary hormonal testing, she will return next week for 8 AM fasting lab test -I am also going to screen her for Cushing's syndrome full 24-hour urine collection -We discussed repeat imaging will be needed in a year   2. Hypothyroidism    - Pt educated extensively on the correct way to take levothyroxine (first thing in the morning with water, 30 minutes before eating or taking other medications). - Pt encouraged to double dose the following day if she were to miss a dose given long half-life of levothyroxine.  Medication Continue levothyroxine 50 MCG daily   3.  Prediabetes  Her A1c was 5.8% in 01/2021 We discussed low-carb diet and avoiding sugar sweetened beverages She is limited on physical activity due to knee osteoarthritis  4.  Unsteady gait:   -We will check vitamin D and B12    Follow-up in 4 months   Signed electronically by: Mack Guise, MD  [facility] Endocrinology  [facility] [address] [place], [place] [address] Phone: [phone] FAX: [phone]   CC: Lorrene Reid, PA-C [place] [address] Phone: [phone] Fax: [phone]   Return to Endocrinology clinic as below: Future Appointments  Date Time Provider [place]  09/10/2021 11:00 AM [facility] 2 - [facility]  10/19/2021  8:30 AM Lorrene Reid, PA-C [facility] None

## 2021-09-10 ENCOUNTER — Ambulatory Visit (HOSPITAL_COMMUNITY)
Admission: RE | Admit: 2021-09-10 | Discharge: 2021-09-10 | Disposition: A | Discharge status: Home / Self Care | Payer: Federal, State, Local not specified - PPO | Source: Ambulatory Visit | Attending: Physician Assistant | Admitting: Physician Assistant

## 2021-09-10 DIAGNOSIS — M7989 Other specified soft tissue disorders: Secondary | ICD-10-CM | POA: Diagnosis not present

## 2021-09-10 DIAGNOSIS — M79606 Pain in leg, unspecified: Secondary | ICD-10-CM

## 2021-09-14 ENCOUNTER — Other Ambulatory Visit: Payer: Self-pay

## 2021-09-14 ENCOUNTER — Other Ambulatory Visit (INDEPENDENT_AMBULATORY_CARE_PROVIDER_SITE_OTHER): Payer: Federal, State, Local not specified - PPO

## 2021-09-14 DIAGNOSIS — R2681 Unsteadiness on feet: Secondary | ICD-10-CM | POA: Diagnosis not present

## 2021-09-14 DIAGNOSIS — R030 Elevated blood-pressure reading, without diagnosis of hypertension: Secondary | ICD-10-CM | POA: Diagnosis not present

## 2021-09-14 DIAGNOSIS — D352 Benign neoplasm of pituitary gland: Secondary | ICD-10-CM | POA: Insufficient documentation

## 2021-09-14 DIAGNOSIS — R7303 Prediabetes: Secondary | ICD-10-CM

## 2021-09-14 DIAGNOSIS — Z6841 Body Mass Index (BMI) 40.0 and over, adult: Secondary | ICD-10-CM | POA: Diagnosis not present

## 2021-09-14 LAB — TSH: TSH: 3.62 u[IU]/mL (ref 0.35–5.50)

## 2021-09-14 LAB — BASIC METABOLIC PANEL
BUN: 13 mg/dL (ref 6–23)
CO2: 28 mEq/L (ref 19–32)
Calcium: 9.4 mg/dL (ref 8.4–10.5)
Chloride: 101 mEq/L (ref 96–112)
Creatinine, Ser: 0.71 mg/dL (ref 0.40–1.20)
GFR: 94.68 mL/min (ref >60.00)
Glucose, Bld: 101 mg/dL — ABNORMAL HIGH (ref 70–99)
Potassium: 4 mEq/L (ref 3.5–5.1)
Sodium: 140 mEq/L (ref 135–145)

## 2021-09-14 LAB — VITAMIN B12: Vitamin B-12: 517 pg/mL (ref 211–911)

## 2021-09-14 LAB — HEMOGLOBIN A1C: Hgb A1c MFr Bld: 5.7 % (ref 4.6–6.5)

## 2021-09-14 LAB — FOLLICLE STIMULATING HORMONE: FSH: 25.3 m[IU]/mL

## 2021-09-14 LAB — T4, FREE: Free T4: 0.7 ng/dL (ref 0.60–1.60)

## 2021-09-14 LAB — CORTISOL: Cortisol, Plasma: 13.4 ug/dL

## 2021-09-14 LAB — VITAMIN D 25 HYDROXY (VIT D DEFICIENCY, FRACTURES): VITD: 50.25 ng/mL (ref 30.00–100.00)

## 2021-09-19 LAB — CORTISOL, URINE, 24 HOUR
24 Hour urine volume (VMAHVA): 1750 mL
CREATININE, URINE: 1.17 g/(24.h) (ref 0.50–2.15)
Cortisol (Ur), Free: 16.7 mcg/24 h (ref 4.0–50.0)

## 2021-09-19 LAB — INSULIN-LIKE GROWTH FACTOR
IGF-I, LC/MS: 79 ng/mL (ref 50–317)
Z-Score (Female): -1.1 SD (ref ?–2.0)

## 2021-09-19 LAB — PROLACTIN: Prolactin: 7.1 ng/mL

## 2021-09-19 LAB — ACTH: C206 ACTH: 16 pg/mL (ref 6–50)

## 2021-10-16 ENCOUNTER — Encounter: Payer: Self-pay | Admitting: Physician Assistant

## 2021-10-16 ENCOUNTER — Other Ambulatory Visit: Payer: Self-pay

## 2021-10-16 ENCOUNTER — Ambulatory Visit (INDEPENDENT_AMBULATORY_CARE_PROVIDER_SITE_OTHER): Payer: Federal, State, Local not specified - PPO | Admitting: Physician Assistant

## 2021-10-16 VITALS — BP 132/77 | HR 86 | Temp 98.8°F | Ht 65.0 in | Wt 291.0 lb

## 2021-10-16 DIAGNOSIS — J0140 Acute pansinusitis, unspecified: Secondary | ICD-10-CM

## 2021-10-16 DIAGNOSIS — J40 Bronchitis, not specified as acute or chronic: Secondary | ICD-10-CM | POA: Diagnosis not present

## 2021-10-16 MED ORDER — AZITHROMYCIN 250 MG PO TABS: ORAL_TABLET | ORAL | 0 refills | Status: AC

## 2021-10-16 MED ORDER — METHYLPREDNISOLONE 4 MG PO TBPK: ORAL_TABLET | ORAL | 0 refills | Status: DC

## 2021-10-16 MED ORDER — BENZONATATE 100 MG PO CAPS
ORAL_CAPSULE | ORAL | 0 refills | Status: DC
Start: 2021-10-16 — End: 2021-10-29

## 2021-10-16 NOTE — Progress Notes (Signed)
Acute Office Visit  Subjective:    Patient ID: Pamela Esparza, female    DOB: 12-25-1965, 56 y.o.   MRN: [id number]  Chief Complaint  Patient presents with   Acute Visit   Cough   Nasal Congestion   Generalized Body Aches   Fever   Sore Throat    HPI Patient is in today for c/o bilateral ear pain, dry cough, nasal congestion, sinus pressure, headache, teeth pain, mild fever (temp max 101), wheezing and postnasal drip. Patient has tried Advil, Dayquil and Mucinex ER without significant relief. Has used her albuterol inhaler more than usual. No chest pain or n/v/d.     Past Medical History:  Diagnosis Date   Anxiety    Arthritis    Asthma    Cataract    DDD (degenerative disc disease), lumbar    Depression    Depression    Phreesia 10/13/2020   Fibromyalgia    Hyperlipidemia    Joint pain    Neuromuscular disorder ([place])    Phreesia 10/13/2020   Neuropathy    Sjogren's disease ([place])    Thyroid disease     Past Surgical History:  Procedure Laterality Date   ABDOMINAL HYSTERECTOMY     APPENDECTOMY     CHOLECYSTECTOMY     ESOPHAGOGASTRODUODENOSCOPY (EGD) WITH PROPOFOL N/A 12/05/2020   Procedure: ESOPHAGOGASTRODUODENOSCOPY (EGD) WITH PROPOFOL;  Surgeon: Virgel Manifold, MD;  Location: [facility] ENDOSCOPY;  Service: Endoscopy;  Laterality: N/A;   finger sugery     NECK SURGERY     TENDON TRANSFER Right 11/06/2019   Procedure: PERONEAL RECONSTRUCTION WITH PERONEAL LONGUS RIGHT LEG;  Surgeon: Newt Minion, MD;  Location: [facility];  Service: Orthopedics;  Laterality: Right;   TUBAL LIGATION N/A    Phreesia 10/13/2020    Family History  Problem Relation Age of Onset   Cancer Mother    Heart attack Father    Hypertension Father    Diabetes Father    Birth defects Maternal Grandmother        ovarian   Depression Maternal Grandmother    Birth defects Paternal Grandmother        brain   Colon cancer Neg Hx    Colon polyps Neg Hx    Esophageal  cancer Neg Hx    Stomach cancer Neg Hx    Rectal cancer Neg Hx     Social History   Socioeconomic History   Marital status: Married    Spouse name: Not on file   Number of children: Not on file   Years of education: Not on file   Highest education level: Not on file  Occupational History   Not on file  Tobacco Use   Smoking status: Never   Smokeless tobacco: Never  Vaping Use   Vaping Use: Never used  Substance and Sexual Activity   Alcohol use: Yes    Comment: 4 drinks a year-rare   Drug use: Never   Sexual activity: Not Currently  Other Topics Concern   Not on file  Social History Narrative   Not on file   Social Determinants of Health   Financial Resource Strain: Not on file  Food Insecurity: Not on file  Transportation Needs: Not on file  Physical Activity: Not on file  Stress: Not on file  Social Connections: Not on file  Intimate Partner Violence: Not on file    Outpatient Medications Prior to Visit  Medication Sig Dispense Refill   albuterol (VENTOLIN HFA)  108 (90 Base) MCG/ACT inhaler INHALE 1 TO 2 PUFFS INTO THE LUNGS EVERY 6 HOURS AS NEEDED 54 g 0   ALPRAZolam (XANAX) 0.5 MG tablet Take 0.5 mg by mouth daily as needed.     atorvastatin (LIPITOR) 10 MG tablet TAKE 1 TABLET(10 MG) BY MOUTH DAILY 90 tablet 1   Cholecalciferol (VITAMIN D3) 75 MCG (3000 UT) TABS Take 5,000 Int'l Units by mouth daily.     cyclobenzaprine (FLEXERIL) 5 MG tablet TAKE 1 TABLET BY MOUTH AT NIGHT FOR 1 WEEK THEN TAKE 1 TABLET BY MOUTH TWICE DAILY     DULoxetine (CYMBALTA) 60 MG capsule Take 120 mg by mouth daily.     fluticasone (FLONASE) 50 MCG/ACT nasal spray SHAKE LIQUID AND USE 1 SPRAY IN EACH NOSTRIL DAILY 16 g 3   gabapentin (NEURONTIN) 600 MG tablet TAKE 1 TABLET(600 MG) BY MOUTH THREE TIMES DAILY 90 tablet 0   hydroxychloroquine (PLAQUENIL) 200 MG tablet Take 200 mg by mouth 2 (two) times daily.     levothyroxine (SYNTHROID) 50 MCG tablet TAKE 1 TABLET(50 MCG) BY MOUTH DAILY  BEFORE AND BREAKFAST 90 tablet 1   meloxicam (MOBIC) 15 MG tablet Take 15 mg by mouth.     Multiple Vitamins-Minerals (CENTRUM SILVER PO) Take by mouth.     nitroGLYCERIN (NITROSTAT) 0.4 MG SL tablet Place under the tongue.     oxyCODONE-acetaminophen (PERCOCET) 10-325 MG tablet Take 1 tablet by mouth 2 (two) times daily.     pilocarpine (SALAGEN) 5 MG tablet Take 5 mg by mouth 3 (three) times daily.     predniSONE (STERAPRED UNI-PAK 48 TAB) 10 MG (48) TBPK tablet 12 day taper - take by mouth as directed for 12 days (Patient not taking: Reported on 09/09/2021) 48 tablet 0   No facility-administered medications prior to visit.    Allergies  Allergen Reactions   Aspirin Other (See Comments)    stomach upset stomach upset    Hydrocodone-Acetaminophen Other (See Comments)    made her sick made her sick    Codeine     Other reaction(s): Upset stomach (finding)   Lyrica [Pregabalin]    Topiramate     Review of Systems Review of Systems:  A fourteen system review of systems was performed and found to be positive as per HPI.    Objective:    Physical Exam Constitutional:      General: She is not in acute distress.    Appearance: She is ill-appearing.  HENT:     Head: Normocephalic and atraumatic.     Comments: Sinus tenderness (maxillary and frontal)    Right Ear: Ear canal normal. Swelling present. No drainage. Tympanic membrane is erythematous.     Left Ear: Ear canal normal. Swelling present. No drainage. Tympanic membrane is erythematous.     Nose: Congestion present.     Mouth/Throat:     Mouth: No oral lesions.     Pharynx: Posterior oropharyngeal erythema present. No oropharyngeal exudate.     Tonsils: No tonsillar exudate. 1+ on the right. 0 on the left.  Eyes:     Conjunctiva/sclera: Conjunctivae normal.     Pupils: Pupils are equal, round, and reactive to light.  Cardiovascular:     Rate and Rhythm: Normal rate and regular rhythm.     Heart sounds: Normal heart  sounds.  Pulmonary:     Effort: No respiratory distress.     Breath sounds: Wheezing and rhonchi present. No rales.  Musculoskeletal:  Cervical back: Neck supple.  Lymphadenopathy:     Cervical: Cervical adenopathy present.  Skin:    General: Skin is warm and dry.  Neurological:     General: No focal deficit present.     Mental Status: She is alert.    BP 132/77    Pulse 86    Temp 98.8 F (37.1 C)    Ht $R'5\' 5"'FK$  (1.651 m)    Wt 291 lb (132 kg)    SpO2 100%    BMI 48.42 kg/m  Wt Readings from Last 3 Encounters:  10/16/21 291 lb (132 kg)  09/09/21 296 lb (134.3 kg)  09/03/21 298 lb 1.6 oz (135.2 kg)    Health Maintenance Due  Topic Date Due   COVID-19 Vaccine (3 - Moderna risk series) 04/30/2020   INFLUENZA VACCINE  Never done    There are no preventive care reminders to display for this patient.   Lab Results  Component Value Date   TSH 3.62 09/14/2021   Lab Results  Component Value Date   WBC 7.1 04/16/2021   HGB 13.3 04/16/2021   HCT 40.3 04/16/2021   MCV 91 04/16/2021   PLT 282 04/16/2021   Lab Results  Component Value Date   NA 140 09/14/2021   K 4.0 09/14/2021   CO2 28 09/14/2021   GLUCOSE 101 (H) 09/14/2021   BUN 13 09/14/2021   CREATININE 0.71 09/14/2021   BILITOT 0.6 04/16/2021   ALKPHOS 115 04/16/2021   AST 22 04/16/2021   ALT 19 04/16/2021   PROT 7.6 04/16/2021   ALBUMIN 4.4 04/16/2021   CALCIUM 9.4 09/14/2021   EGFR 96 04/16/2021   GFR 94.68 09/14/2021   Lab Results  Component Value Date   CHOL 195 04/16/2021   Lab Results  Component Value Date   HDL 70 04/16/2021   Lab Results  Component Value Date   LDLCALC 105 (H) 04/16/2021   Lab Results  Component Value Date   TRIG 111 04/16/2021   Lab Results  Component Value Date   CHOLHDL 2.8 04/16/2021   Lab Results  Component Value Date   HGBA1C 5.7 09/14/2021       Assessment & Plan:   Problem List Items Addressed This Visit   None Visit Diagnoses     Acute  non-recurrent pansinusitis    -  Primary   Relevant Medications   azithromycin (ZITHROMAX) 250 MG tablet   methylPREDNISolone (MEDROL DOSEPAK) 4 MG TBPK tablet   benzonatate (TESSALON PERLES) 100 MG capsule   Bronchitis       Relevant Medications   methylPREDNISolone (MEDROL DOSEPAK) 4 MG TBPK tablet   benzonatate (TESSALON PERLES) 100 MG capsule      Patient has s/sx consistent with bacterial sinusitis and bronchitis. Will start oral antibiotic therapy and corticosteroid therapy. Take tessalon Perles as needed for cough. Advised to continue with home supportive care and monitor for worsening symptoms. Follow up if symptoms fail to improve or worsen.  Meds ordered this encounter  Medications   azithromycin (ZITHROMAX) 250 MG tablet    Sig: Take 2 tablets on day 1, then 1 tablet daily on days 2 through 5    Dispense:  6 tablet    Refill:  0    Order Specific Question:   Supervising Provider    Answer:   Beatrice Lecher D [[id number]]   methylPREDNISolone (MEDROL DOSEPAK) 4 MG TBPK tablet    Sig: Take as directed on package.    Dispense:  21 tablet  Refill:  0    Order Specific Question:   Supervising Provider    Answer:   Beatrice Lecher D [[id number]]   benzonatate (TESSALON PERLES) 100 MG capsule    Sig: Take 1-2 capsules by mouth three times daily as needed for cough.    Dispense:  30 capsule    Refill:  0    Order Specific Question:   Supervising Provider    Answer:   Beatrice Lecher D [[id number]]     Lorrene Reid, PA-C

## 2021-10-16 NOTE — Patient Instructions (Signed)

## 2021-10-17 ENCOUNTER — Other Ambulatory Visit: Payer: Self-pay | Admitting: Physician Assistant

## 2021-10-17 DIAGNOSIS — E7800 Pure hypercholesterolemia, unspecified: Secondary | ICD-10-CM

## 2021-10-17 DIAGNOSIS — E039 Hypothyroidism, unspecified: Secondary | ICD-10-CM

## 2021-10-19 ENCOUNTER — Other Ambulatory Visit: Payer: Self-pay | Admitting: Physician Assistant

## 2021-10-19 ENCOUNTER — Encounter: Payer: Federal, State, Local not specified - PPO | Admitting: Physician Assistant

## 2021-10-20 ENCOUNTER — Other Ambulatory Visit: Payer: Self-pay | Admitting: Physician Assistant

## 2021-10-22 ENCOUNTER — Other Ambulatory Visit: Payer: Self-pay | Admitting: Cardiology

## 2021-10-22 DIAGNOSIS — R079 Chest pain, unspecified: Secondary | ICD-10-CM

## 2021-10-22 DIAGNOSIS — R0602 Shortness of breath: Secondary | ICD-10-CM

## 2021-10-29 ENCOUNTER — Ambulatory Visit (INDEPENDENT_AMBULATORY_CARE_PROVIDER_SITE_OTHER): Payer: Federal, State, Local not specified - PPO | Admitting: Physician Assistant

## 2021-10-29 ENCOUNTER — Encounter: Payer: Self-pay | Admitting: Physician Assistant

## 2021-10-29 ENCOUNTER — Other Ambulatory Visit: Payer: Self-pay

## 2021-10-29 VITALS — BP 134/83 | HR 72 | Temp 97.6°F | Ht 65.0 in | Wt 292.0 lb

## 2021-10-29 DIAGNOSIS — Z0000 Encounter for general adult medical examination without abnormal findings: Secondary | ICD-10-CM | POA: Diagnosis not present

## 2021-10-29 DIAGNOSIS — G629 Polyneuropathy, unspecified: Secondary | ICD-10-CM | POA: Diagnosis not present

## 2021-10-29 DIAGNOSIS — J40 Bronchitis, not specified as acute or chronic: Secondary | ICD-10-CM

## 2021-10-29 MED ORDER — METHYLPREDNISOLONE SODIUM SUCC 125 MG IJ SOLR
125.0000 mg | Freq: Once | INTRAMUSCULAR | Status: AC
  Administered 2021-10-29: 125 mg via INTRAMUSCULAR

## 2021-10-29 MED ORDER — GABAPENTIN 600 MG PO TABS: 600.0000 mg | ORAL_TABLET | Freq: Three times a day (TID) | ORAL | 1 refills | Status: DC

## 2021-10-29 NOTE — Patient Instructions (Signed)

## 2021-10-29 NOTE — Progress Notes (Signed)
Complete physical exam   Patient: Pamela Esparza   DOB: Sep 20, 1966   56 y.o. Female  MRN: [id number] Visit Date: 10/29/2021   Chief Complaint  Patient presents with   Annual Exam   Subjective    Pamela Esparza is a 56 y.o. female who presents today for a complete physical exam.  She reports consuming a general diet. Exercise is limited by orthopedic condition(s): chronic pain. She generally feels but still getting over respiratory illness, still has chest tightness and cough. She does not have additional problems to discuss today.    Past Medical History:  Diagnosis Date   Anxiety    Arthritis    Asthma    Cataract    DDD (degenerative disc disease), lumbar    Depression    Depression    Phreesia 10/13/2020   Fibromyalgia    Hyperlipidemia    Joint pain    Neuromuscular disorder ([place])    Phreesia 10/13/2020   Neuropathy    Sjogren's disease ([place])    Thyroid disease    Past Surgical History:  Procedure Laterality Date   ABDOMINAL HYSTERECTOMY     APPENDECTOMY     CHOLECYSTECTOMY     ESOPHAGOGASTRODUODENOSCOPY (EGD) WITH PROPOFOL N/A 12/05/2020   Procedure: ESOPHAGOGASTRODUODENOSCOPY (EGD) WITH PROPOFOL;  Surgeon: Virgel Manifold, MD;  Location: [facility];  Service: Endoscopy;  Laterality: N/A;   finger sugery     NECK SURGERY     TENDON TRANSFER Right 11/06/2019   Procedure: PERONEAL RECONSTRUCTION WITH PERONEAL LONGUS RIGHT LEG;  Surgeon: Newt Minion, MD;  Location: [facility];  Service: Orthopedics;  Laterality: Right;   TUBAL LIGATION N/A    Phreesia 10/13/2020   Social History   Socioeconomic History   Marital status: Married    Spouse name: Not on file   Number of children: Not on file   Years of education: Not on file   Highest education level: Not on file  Occupational History   Not on file  Tobacco Use   Smoking status: Never   Smokeless tobacco: Never  Vaping Use   Vaping Use: Never used  Substance and Sexual  Activity   Alcohol use: Yes    Comment: 4 drinks a year-rare   Drug use: Never   Sexual activity: Not Currently  Other Topics Concern   Not on file  Social History Narrative   Not on file   Social Determinants of Health   Financial Resource Strain: Not on file  Food Insecurity: Not on file  Transportation Needs: Not on file  Physical Activity: Not on file  Stress: Not on file  Social Connections: Not on file  Intimate Partner Violence: Not on file     Medications: Outpatient Medications Prior to Visit  Medication Sig   albuterol (VENTOLIN HFA) 108 (90 Base) MCG/ACT inhaler INHALE 1 TO 2 PUFFS INTO THE LUNGS EVERY 6 HOURS AS NEEDED   ALPRAZolam (XANAX) 0.5 MG tablet Take 0.5 mg by mouth daily as needed.   atorvastatin (LIPITOR) 10 MG tablet TAKE 1 TABLET(10 MG) BY MOUTH DAILY   Cholecalciferol (VITAMIN D3) 75 MCG (3000 UT) TABS Take 5,000 Int'l Units by mouth daily.   cyclobenzaprine (FLEXERIL) 5 MG tablet TAKE 1 TABLET BY MOUTH AT NIGHT FOR 1 WEEK THEN TAKE 1 TABLET BY MOUTH TWICE DAILY   DULoxetine (CYMBALTA) 60 MG capsule Take 120 mg by mouth daily.   fluticasone (FLONASE) 50 MCG/ACT nasal spray SHAKE LIQUID AND USE 1 SPRAY  IN EACH NOSTRIL DAILY   hydroxychloroquine (PLAQUENIL) 200 MG tablet Take 200 mg by mouth 2 (two) times daily.   levothyroxine (SYNTHROID) 50 MCG tablet TAKE 1 TABLET(50 MCG) BY MOUTH DAILY BEFORE AND BREAKFAST   meloxicam (MOBIC) 15 MG tablet Take 15 mg by mouth.   Multiple Vitamins-Minerals (CENTRUM SILVER PO) Take by mouth.   nitroGLYCERIN (NITROSTAT) 0.4 MG SL tablet Place under the tongue.   oxyCODONE-acetaminophen (PERCOCET) 10-325 MG tablet Take 1 tablet by mouth 2 (two) times daily.   pilocarpine (SALAGEN) 5 MG tablet Take 5 mg by mouth 3 (three) times daily.   [DISCONTINUED] gabapentin (NEURONTIN) 600 MG tablet TAKE 1 TABLET(600 MG) BY MOUTH THREE TIMES DAILY   [DISCONTINUED] benzonatate (TESSALON PERLES) 100 MG capsule Take 1-2 capsules by mouth  three times daily as needed for cough.   [DISCONTINUED] methylPREDNISolone (MEDROL DOSEPAK) 4 MG TBPK tablet Take as directed on package.   No facility-administered medications prior to visit.    Review of Systems Review of Systems:  A fourteen system review of systems was performed and found to be positive as per HPI.    Objective    BP 134/83    Pulse 72    Temp 97.6 F (36.4 C)    Ht 5\' 5"  (1.651 m)    Wt 292 lb (132.5 kg)    SpO2 98%    BMI 48.59 kg/m    Physical Exam   General Appearance:    Alert, cooperative, in no acute distress, appears stated age   Head:    Normocephalic, without obvious abnormality, atraumatic  Eyes:    PERRL, conjunctiva/corneas clear, EOM's intact, fundi    benign, both eyes  Ears:    Normal TM of left ear, some fluid behind right TM,and normal external ear canals, both ears  Nose:   Nares normal, septum midline, mucosa normal, no drainage    or sinus tenderness, tenderness at right temple  Throat:   Lips, mucosa, and tongue normal; teeth and gums normal  Neck:   Supple, symmetrical, trachea midline, no adenopathy;    thyroid:  no enlargement/tenderness/nodules; no JVD  Back:     Symmetric, no curvature, ROM normal, no CVA tenderness  Lungs:     Rhonchi mostly right lung, no crackles or rales, respirations unlabored  Chest Wall:    No tenderness   Heart:    Normal heart rate. Normal rhythm. No murmurs, rubs, or gallops.    Breast Exam:    deferred, pt  Abdomen:     Soft, non-tender, bowel sounds active all four quadrants,    no masses, no organomegaly  Pelvic:    not indicated; status post hysterectomy, negative ROS  Extremities:   All extremities are intact. No cyanosis or pitting edema, tenderness of calves b/l  Pulses:   2+ and symmetric all extremities  Skin:   Skin color, texture, turgor normal, no rashes or lesions  Lymph nodes:   Cervical and supraclavicular nodes normal  Neurologic:   CNII-XII grossly intact     Last depression  screening scores PHQ 2/9 Scores 10/29/2021 10/16/2021 09/03/2021  PHQ - 2 Score 0 0 1  PHQ- 9 Score 5 1 6    Last fall risk screening Fall Risk  10/29/2021  Falls in the past year? 1  Number falls in past yr: 1  Injury with Fall? 0  Risk for fall due to : History of fall(s);Impaired balance/gait;Impaired mobility  Follow up Falls evaluation completed     No results  found for any visits on 10/29/21.  Assessment & Plan    Routine Health Maintenance and Physical Exam  Exercise Activities and Dietary recommendations -Recommend to follow a heart healthy diet low in fat and carbohydrates.   Immunization History  Administered Date(s) Administered   Hep A / Hep B 02/10/2021, 03/13/2021   Moderna Sars-Covid-2 Vaccination 03/05/2020, 04/02/2020   Tdap 03/21/2019   Zoster Recombinat (Shingrix) 10/14/2020, 04/16/2021    Health Maintenance  Topic Date Due   COVID-19 Vaccine (3 - Moderna risk series) 04/30/2020   INFLUENZA VACCINE  12/25/2021 (Originally 04/27/2021)   COLONOSCOPY (Pts 45-80yrs Insurance coverage will need to be confirmed)  09/02/2022   MAMMOGRAM  10/24/2022   TETANUS/TDAP  03/20/2029   Hepatitis C Screening  Completed   HIV Screening  Completed   Zoster Vaccines- Shingrix  Completed   HPV VACCINES  Aged Out   PAP SMEAR-Modifier  Discontinued    Discussed health benefits of physical activity, and encouraged her to engage in regular exercise appropriate for her age and condition.  Problem List Items Addressed This Visit       Nervous and Auditory   Neuropathy   Relevant Medications   gabapentin (NEURONTIN) 600 MG tablet     Other   Healthcare maintenance - Primary   Other Visit Diagnoses     Bronchitis       Relevant Medications   methylPREDNISolone sodium succinate (SOLU-MEDROL) 125 mg/2 mL injection 125 mg (Completed)      Will administer corticosteroid injection for bronchitis. Patient is afebrile and clinically looks better so do not recommend another  antibiotic at this time for sinusitis.  Patient reports scheduling her mammogram. Continue current medication regimen. Provided needed refills.   Return in about 6 months (around 04/28/2022) for HLD, Thyroid, Neuropathy, and FBW .    Lorrene Reid, PA-C  [facility] at [facility] [phone] (phone) [phone] (fax)  [place]

## 2021-11-16 DIAGNOSIS — M5136 Other intervertebral disc degeneration, lumbar region: Secondary | ICD-10-CM | POA: Diagnosis not present

## 2021-11-16 DIAGNOSIS — M47816 Spondylosis without myelopathy or radiculopathy, lumbar region: Secondary | ICD-10-CM | POA: Diagnosis not present

## 2021-11-16 DIAGNOSIS — G8929 Other chronic pain: Secondary | ICD-10-CM | POA: Diagnosis not present

## 2021-11-16 DIAGNOSIS — G894 Chronic pain syndrome: Secondary | ICD-10-CM | POA: Diagnosis not present

## 2021-11-16 DIAGNOSIS — Z79891 Long term (current) use of opiate analgesic: Secondary | ICD-10-CM | POA: Diagnosis not present

## 2021-11-16 DIAGNOSIS — M47812 Spondylosis without myelopathy or radiculopathy, cervical region: Secondary | ICD-10-CM | POA: Diagnosis not present

## 2021-11-16 DIAGNOSIS — M7918 Myalgia, other site: Secondary | ICD-10-CM | POA: Diagnosis not present

## 2021-11-16 DIAGNOSIS — M25562 Pain in left knee: Secondary | ICD-10-CM | POA: Diagnosis not present

## 2021-11-16 DIAGNOSIS — M170 Bilateral primary osteoarthritis of knee: Secondary | ICD-10-CM | POA: Diagnosis not present

## 2021-11-16 DIAGNOSIS — M533 Sacrococcygeal disorders, not elsewhere classified: Secondary | ICD-10-CM | POA: Diagnosis not present

## 2021-11-16 DIAGNOSIS — M461 Sacroiliitis, not elsewhere classified: Secondary | ICD-10-CM | POA: Diagnosis not present

## 2021-11-16 DIAGNOSIS — M25561 Pain in right knee: Secondary | ICD-10-CM | POA: Diagnosis not present

## 2021-11-16 DIAGNOSIS — M4802 Spinal stenosis, cervical region: Secondary | ICD-10-CM | POA: Diagnosis not present

## 2021-11-16 DIAGNOSIS — G5721 Lesion of femoral nerve, right lower limb: Secondary | ICD-10-CM | POA: Diagnosis not present

## 2021-11-16 DIAGNOSIS — M5030 Other cervical disc degeneration, unspecified cervical region: Secondary | ICD-10-CM | POA: Diagnosis not present

## 2021-11-16 DIAGNOSIS — M961 Postlaminectomy syndrome, not elsewhere classified: Secondary | ICD-10-CM | POA: Diagnosis not present

## 2021-12-10 ENCOUNTER — Other Ambulatory Visit: Payer: Self-pay | Admitting: Physician Assistant

## 2021-12-17 DIAGNOSIS — M7989 Other specified soft tissue disorders: Secondary | ICD-10-CM | POA: Diagnosis not present

## 2021-12-17 DIAGNOSIS — Z6841 Body Mass Index (BMI) 40.0 and over, adult: Secondary | ICD-10-CM | POA: Diagnosis not present

## 2021-12-17 DIAGNOSIS — R21 Rash and other nonspecific skin eruption: Secondary | ICD-10-CM | POA: Diagnosis not present

## 2021-12-17 DIAGNOSIS — M797 Fibromyalgia: Secondary | ICD-10-CM | POA: Diagnosis not present

## 2021-12-17 DIAGNOSIS — M3500 Sicca syndrome, unspecified: Secondary | ICD-10-CM | POA: Diagnosis not present

## 2021-12-17 DIAGNOSIS — M1991 Primary osteoarthritis, unspecified site: Secondary | ICD-10-CM | POA: Diagnosis not present

## 2021-12-24 DIAGNOSIS — M5136 Other intervertebral disc degeneration, lumbar region: Secondary | ICD-10-CM | POA: Diagnosis not present

## 2021-12-24 DIAGNOSIS — M461 Sacroiliitis, not elsewhere classified: Secondary | ICD-10-CM | POA: Diagnosis not present

## 2021-12-24 DIAGNOSIS — M533 Sacrococcygeal disorders, not elsewhere classified: Secondary | ICD-10-CM | POA: Diagnosis not present

## 2021-12-24 DIAGNOSIS — M5030 Other cervical disc degeneration, unspecified cervical region: Secondary | ICD-10-CM | POA: Diagnosis not present

## 2021-12-24 DIAGNOSIS — M47816 Spondylosis without myelopathy or radiculopathy, lumbar region: Secondary | ICD-10-CM | POA: Diagnosis not present

## 2021-12-24 DIAGNOSIS — G5781 Other specified mononeuropathies of right lower limb: Secondary | ICD-10-CM | POA: Diagnosis not present

## 2021-12-24 DIAGNOSIS — M47812 Spondylosis without myelopathy or radiculopathy, cervical region: Secondary | ICD-10-CM | POA: Diagnosis not present

## 2021-12-24 DIAGNOSIS — M961 Postlaminectomy syndrome, not elsewhere classified: Secondary | ICD-10-CM | POA: Diagnosis not present

## 2021-12-24 DIAGNOSIS — G894 Chronic pain syndrome: Secondary | ICD-10-CM | POA: Diagnosis not present

## 2021-12-24 DIAGNOSIS — Z79891 Long term (current) use of opiate analgesic: Secondary | ICD-10-CM | POA: Diagnosis not present

## 2021-12-24 DIAGNOSIS — G8929 Other chronic pain: Secondary | ICD-10-CM | POA: Diagnosis not present

## 2021-12-24 DIAGNOSIS — M7918 Myalgia, other site: Secondary | ICD-10-CM | POA: Diagnosis not present

## 2021-12-24 DIAGNOSIS — M170 Bilateral primary osteoarthritis of knee: Secondary | ICD-10-CM | POA: Diagnosis not present

## 2021-12-24 DIAGNOSIS — M4802 Spinal stenosis, cervical region: Secondary | ICD-10-CM | POA: Diagnosis not present

## 2021-12-28 ENCOUNTER — Encounter: Payer: Self-pay | Admitting: Physician Assistant

## 2021-12-28 ENCOUNTER — Ambulatory Visit (INDEPENDENT_AMBULATORY_CARE_PROVIDER_SITE_OTHER): Payer: Federal, State, Local not specified - PPO | Admitting: Physician Assistant

## 2021-12-28 VITALS — BP 124/84 | HR 72 | Temp 97.3°F | Ht 65.0 in | Wt 291.0 lb

## 2021-12-28 DIAGNOSIS — J0141 Acute recurrent pansinusitis: Secondary | ICD-10-CM | POA: Diagnosis not present

## 2021-12-28 MED ORDER — AMOXICILLIN-POT CLAVULANATE 875-125 MG PO TABS: 1.0000 | ORAL_TABLET | Freq: Two times a day (BID) | ORAL | 0 refills | Status: DC

## 2021-12-28 MED ORDER — FLUCONAZOLE 150 MG PO TABS: 150.0000 mg | ORAL_TABLET | Freq: Once | ORAL | 0 refills | Status: AC

## 2021-12-28 NOTE — Progress Notes (Signed)
?Established patient acute visit ? ? ?Patient: Pamela Esparza   DOB: 31-Dec-1965   55 y.o. Female  MRN: [id number] ?Visit Date: 12/28/2021 ? ?Chief Complaint  ?Patient presents with  ? Acute Visit  ? ?Subjective  ?  ?HPI  ?Patient presents for c/o sinus pressure, bilateral earache, headache, postnasal drainage, and scratchy throat. Initially had runny nose and nasal congestion. No cough, just throat clearing from postnasal drip. No fever, chills, body aches, or n/v/d. Symptoms ongoing for about 1 week (7 days). Reports has seasonal allergies and usually has to take Claritin-D otherwise her nasal sinuses will swell and be very congested. Has been using Flonase and started taking Dayquil and Nyquil with some relief. ? ? ?Medications: ?Outpatient Medications Prior to Visit  ?Medication Sig  ? albuterol (VENTOLIN HFA) 108 (90 Base) MCG/ACT inhaler INHALE 1 TO 2 PUFFS INTO THE LUNGS EVERY 6 HOURS AS NEEDED  ? ALPRAZolam (XANAX) 0.5 MG tablet Take 0.5 mg by mouth daily as needed.  ? atorvastatin (LIPITOR) 10 MG tablet TAKE 1 TABLET(10 MG) BY MOUTH DAILY  ? Cholecalciferol (VITAMIN D3) 75 MCG (3000 UT) TABS Take 5,000 Int'l Units by mouth daily.  ? cyclobenzaprine (FLEXERIL) 5 MG tablet TAKE 1 TABLET BY MOUTH AT NIGHT FOR 1 WEEK THEN TAKE 1 TABLET BY MOUTH TWICE DAILY  ? DULoxetine (CYMBALTA) 60 MG capsule Take 120 mg by mouth daily.  ? fluticasone (FLONASE) 50 MCG/ACT nasal spray SHAKE LIQUID AND USE 1 SPRAY IN EACH NOSTRIL DAILY  ? gabapentin (NEURONTIN) 600 MG tablet Take 1 tablet (600 mg total) by mouth 3 (three) times daily.  ? hydroxychloroquine (PLAQUENIL) 200 MG tablet Take 200 mg by mouth 2 (two) times daily.  ? levothyroxine (SYNTHROID) 50 MCG tablet TAKE 1 TABLET(50 MCG) BY MOUTH DAILY BEFORE AND BREAKFAST  ? meloxicam (MOBIC) 15 MG tablet Take 15 mg by mouth.  ? Multiple Vitamins-Minerals (CENTRUM SILVER PO) Take by mouth.  ? nitroGLYCERIN (NITROSTAT) 0.4 MG SL tablet Place under the tongue.  ?  oxyCODONE-acetaminophen (PERCOCET) 10-325 MG tablet Take 1 tablet by mouth 2 (two) times daily.  ? pilocarpine (SALAGEN) 5 MG tablet Take 5 mg by mouth 3 (three) times daily.  ? ?No facility-administered medications prior to visit.  ? ? ?Review of Systems ?Review of Systems:  ?A fourteen system review of systems was performed and found to be positive as per HPI. ? ? ?  Objective  ?  ?BP 124/84   Pulse 72   Temp (!) 97.3 ?F (36.3 ?C)   Ht '5\' 5"'$  (1.651 m)   Wt 291 lb (132 kg)   SpO2 98%   BMI 48.42 kg/m?  ? ? ?Physical Exam  ?General:  Well Developed, well nourished, appropriate for stated age.  ?Neuro:  Alert and oriented,  extra-ocular muscles intact  ?HEENT:  Normocephalic, atraumatic, normal conjunctiva, sinus tenderness (frontal and maxillary), mild swelling and erythema of right TM, some fluid of left TM, normal external ear canals, red and boggy turbinates, mild erythema of posterior oropharynx,  neck supple, no cervical adenopathy  ?Skin:  no gross rash, warm, pink. ?Cardiac:  RRR, S1 S2 ?Respiratory: CTA B/L w/o wheezing, crackles or rales. ?Vascular:  Ext warm, no cyanosis apprec.; cap RF less 2 sec. ?Psych:  No HI/SI, judgement and insight good, Euthymic mood. Full Affect. ? ? ?No results found for any visits on 12/28/21. ? Assessment & Plan  ?  ? ?Discussed with patient referral to ENT for recurrent sinusitis, going on an extended  cruise next week and prefers to wait until she returns. Advised to notify the office. Discussed the use of decongestant for short term use especially with hx of hypertension. Will start antibiotic therapy with Augmentin for sinusitis, reports a hx of yeast infection so will send rx for fluconazole. Recommend to continue home supportive care as well saline nasal rinses.  ? ? ?Return if symptoms worsen or fail to improve.  ?   ? ? ? ?Lorrene Reid, PA-C  ?[facility] at [facility] ?[phone] (phone) ?[phone] (fax) ? ?[facility] ?

## 2022-01-04 ENCOUNTER — Encounter: Payer: Self-pay | Admitting: Internal Medicine

## 2022-01-04 ENCOUNTER — Ambulatory Visit (INDEPENDENT_AMBULATORY_CARE_PROVIDER_SITE_OTHER): Payer: Federal, State, Local not specified - PPO | Admitting: Internal Medicine

## 2022-01-04 VITALS — BP 138/76 | HR 76 | Ht 65.0 in | Wt 292.6 lb

## 2022-01-04 DIAGNOSIS — R7303 Prediabetes: Secondary | ICD-10-CM

## 2022-01-04 DIAGNOSIS — E039 Hypothyroidism, unspecified: Secondary | ICD-10-CM

## 2022-01-04 DIAGNOSIS — D352 Benign neoplasm of pituitary gland: Secondary | ICD-10-CM | POA: Diagnosis not present

## 2022-01-04 LAB — BASIC METABOLIC PANEL
BUN: 17 mg/dL (ref 6–23)
CO2: 32 mEq/L (ref 19–32)
Calcium: 9.9 mg/dL (ref 8.4–10.5)
Chloride: 101 mEq/L (ref 96–112)
Creatinine, Ser: 0.77 mg/dL (ref 0.40–1.20)
GFR: 85.72 mL/min (ref >60.00)
Glucose, Bld: 110 mg/dL — ABNORMAL HIGH (ref 70–99)
Potassium: 4.7 mEq/L (ref 3.5–5.1)
Sodium: 143 mEq/L (ref 135–145)

## 2022-01-04 LAB — TSH: TSH: 2.74 u[IU]/mL (ref 0.35–5.50)

## 2022-01-04 LAB — HEMOGLOBIN A1C: Hgb A1c MFr Bld: 5.9 % (ref 4.6–6.5)

## 2022-01-04 MED ORDER — LEVOTHYROXINE SODIUM 50 MCG PO TABS: 50.0000 ug | ORAL_TABLET | Freq: Every day | ORAL | 3 refills | Status: DC

## 2022-01-04 NOTE — Progress Notes (Signed)
? ? ?Name: Pamela Esparza  ?MRN/ DOB: [id number], June 09, 1966    ?Age/ Sex: 56 y.o., female   ? ?PCP: Lorrene Reid, PA-C   ?Reason for Endocrinology Evaluation: Pituitary microadenoma  ?   ?Date of Initial Endocrinology Evaluation: 09/09/2021  ? ? ?HPI: ?Pamela Esparza is a 56 y.o. female with a past medical history of hypothyroid, Sjogren's syndrome, and HTN. The patient presented for initial endocrinology clinic visit on 09/09/2021 for consultative assistance with her pituitary microadenoma.  ? ?During evaluation for hearing loss through ENT the patient was noted to have 6 mm pituitary adenoma on MRI dated 07/02/2021 ? ?Hormonal testing was normal including IGF-1, 24-hour urinary cortisol 16.7 mcg, TFTs, prolactin and ACTH with cortisol (08/2021) ? ? ?She was diagnosed with  hypothyroidism ~2020 . Has been on Lt-4 replacement since then  ? ? ? ?SUBJECTIVE:  ? ? ?Today (01/04/22): Pamela Esparza is here for follow-up on pituitary microadenoma, prediabetes and hypothyroidism . ? ? ? ?Weight has been stable  ?Has alternating bowel movement  ?No recent  Palpitations  ?Has LE edema - stable  ?No prior dx of diabetes but has pre-diabetes  ?Has bruising  ?Has OA of the knees  ?NO change in ring size or show size  , has hand swelling  ?She has worsening headaches, due to sinus issues , on 2nd course of Abx  ?Vision changes yearly,  up to date on eye exam  ? ?She has noted with hot showers she gets nauseous and feels sick to the stomach , she has excessive sweating as well  ? ?Levothyroxine 50 mcg daily ? ? ?HISTORY:  ?Past Medical History:  ?Past Medical History:  ?Diagnosis Date  ? Anxiety   ? Arthritis   ? Asthma   ? Cataract   ? DDD (degenerative disc disease), lumbar   ? Depression   ? Depression   ? Phreesia 10/13/2020  ? Fibromyalgia   ? Hyperlipidemia   ? Joint pain   ? Neuromuscular disorder ([place])   ? Phreesia 10/13/2020  ? Neuropathy   ? Sjogren's disease ([place])   ? Thyroid disease   ? ?Past Surgical History:   ?Past Surgical History:  ?Procedure Laterality Date  ? ABDOMINAL HYSTERECTOMY    ? APPENDECTOMY    ? CHOLECYSTECTOMY    ? ESOPHAGOGASTRODUODENOSCOPY (EGD) WITH PROPOFOL N/A 12/05/2020  ? Procedure: ESOPHAGOGASTRODUODENOSCOPY (EGD) WITH PROPOFOL;  Surgeon: Virgel Manifold, MD;  Location: [facility];  Service: Endoscopy;  Laterality: N/A;  ? finger sugery    ? NECK SURGERY    ? TENDON TRANSFER Right 11/06/2019  ? Procedure: PERONEAL RECONSTRUCTION WITH PERONEAL LONGUS RIGHT LEG;  Surgeon: Newt Minion, MD;  Location: [facility];  Service: Orthopedics;  Laterality: Right;  ? TUBAL LIGATION N/A   ? Phreesia 10/13/2020  ?  ?Social History:  reports that she has never smoked. She has never used smokeless tobacco. She reports current alcohol use. She reports that she does not use drugs. ?Family History: family history includes Birth defects in her maternal grandmother and paternal grandmother; Cancer in her mother; Depression in her maternal grandmother; Diabetes in her father; Heart attack in her father; Hypertension in her father. ? ? ?HOME MEDICATIONS: ?Allergies as of 01/04/2022   ? ?   Reactions  ? Aspirin Other (See Comments)  ? stomach upset ?stomach upset  ? Hydrocodone-acetaminophen Other (See Comments)  ? made her sick ?made her sick  ? Codeine   ? Other reaction(s): Upset  stomach (finding)  ? Lyrica [pregabalin]   ? Solu-medrol [methylprednisolone] Itching  ? Topiramate   ? ?  ? ?  ?Medication List  ?  ? ?  ? Accurate as of January 04, 2022 10:31 AM. If you have any questions, ask your nurse or doctor.  ?  ?  ? ?  ? ?albuterol 108 (90 Base) MCG/ACT inhaler ?Commonly known as: VENTOLIN HFA ?INHALE 1 TO 2 PUFFS INTO THE LUNGS EVERY 6 HOURS AS NEEDED ?  ?ALPRAZolam 0.5 MG tablet ?Commonly known as: Duanne Moron ?Take 0.5 mg by mouth daily as needed. ?  ?amoxicillin-clavulanate 875-125 MG tablet ?Commonly known as: AUGMENTIN ?Take 1 tablet by mouth 2 (two) times daily. ?  ?atorvastatin 10 MG  tablet ?Commonly known as: LIPITOR ?TAKE 1 TABLET(10 MG) BY MOUTH DAILY ?  ?CENTRUM SILVER PO ?Take by mouth. ?  ?cyclobenzaprine 5 MG tablet ?Commonly known as: FLEXERIL ?TAKE 1 TABLET BY MOUTH AT NIGHT FOR 1 WEEK THEN TAKE 1 TABLET BY MOUTH TWICE DAILY ?  ?DULoxetine 60 MG capsule ?Commonly known as: CYMBALTA ?Take 120 mg by mouth daily. ?  ?fluticasone 50 MCG/ACT nasal spray ?Commonly known as: FLONASE ?SHAKE LIQUID AND USE 1 SPRAY IN EACH NOSTRIL DAILY ?  ?gabapentin 600 MG tablet ?Commonly known as: NEURONTIN ?Take 1 tablet (600 mg total) by mouth 3 (three) times daily. ?  ?hydroxychloroquine 200 MG tablet ?Commonly known as: PLAQUENIL ?Take 200 mg by mouth 2 (two) times daily. ?  ?levothyroxine 50 MCG tablet ?Commonly known as: SYNTHROID ?TAKE 1 TABLET(50 MCG) BY MOUTH DAILY BEFORE AND BREAKFAST ?  ?meloxicam 15 MG tablet ?Commonly known as: MOBIC ?Take 15 mg by mouth. ?  ?nitroGLYCERIN 0.4 MG SL tablet ?Commonly known as: NITROSTAT ?Place under the tongue. ?  ?oxyCODONE-acetaminophen 10-325 MG tablet ?Commonly known as: PERCOCET ?Take 1 tablet by mouth 2 (two) times daily. ?  ?pilocarpine 5 MG tablet ?Commonly known as: SALAGEN ?Take 5 mg by mouth 3 (three) times daily. ?  ?Vitamin D3 75 MCG (3000 UT) Tabs ?Take 5,000 Int'l Units by mouth daily. ?  ? ?  ?  ? ? ?REVIEW OF SYSTEMS: ?A comprehensive ROS was conducted with the patient and is negative except as per HPI  ? ? ? ?OBJECTIVE:  ?VS: BP 138/76 (BP Location: Left Arm, Patient Position: Sitting, Cuff Size: Large)   Pulse 76   Ht '5\' 5"'$  (1.651 m)   Wt 292 lb 9.6 oz (132.7 kg)   SpO2 98%   BMI 48.69 kg/m?   ? ?Wt Readings from Last 3 Encounters:  ?01/04/22 292 lb 9.6 oz (132.7 kg)  ?12/28/21 291 lb (132 kg)  ?10/29/21 292 lb (132.5 kg)  ? ? ? ?EXAM: ?General: Pt appears well and is in NAD  ?Neck: General: Supple without adenopathy. ?Thyroid: Thyroid size normal.  No goiter or nodules appreciated.   ?Lungs: Clear with good BS bilat with no rales, rhonchi,  or wheezes  ?Heart: Auscultation: RRR.  ?Extremities:  ?BL LE: No pretibial edema normal ROM and strength.  ?Mental Status: Judgment, insight: Intact ?Orientation: Oriented to time, place, and person ?Mood and affect: No depression, anxiety, or agitation  ? ? ? ?DATA REVIEWED: ? Latest Reference Range & Units 01/04/22 10:45  ?Sodium 135 - 145 mEq/L 143  ?Potassium 3.5 - 5.1 mEq/L 4.7  ?Chloride 96 - 112 mEq/L 101  ?CO2 19 - 32 mEq/L 32  ?Glucose 70 - 99 mg/dL 110 (H)  ?BUN 6 - 23 mg/dL 17  ?Creatinine  0.40 - 1.20 mg/dL 0.77  ?Calcium 8.4 - 10.5 mg/dL 9.9  ?GFR >60.00 mL/min 85.72  ? ? Latest Reference Range & Units 01/04/22 10:45  ?Glucose 70 - 99 mg/dL 110 (H)  ?Hemoglobin A1C 4.6 - 6.5 % 5.9  ?TSH 0.35 - 5.50 uIU/mL 2.74  ? ? ? ? ?MRI 07/02/2021 ? ?FINDINGS: ?Brain: ?  ?Cerebral volume is normal. ?  ?Subtle ill-defined T2 FLAIR hyperintense signal abnormality anterior ?right frontal lobe white matter (series 6, image 17). ?  ?6 mm round hypoenhancing focus within the left aspect of the ?pituitary gland, highly suspicious for a pituitary microadenoma ?(series 14, image 24). ?  ?No cerebellopontine angle or internal auditory canal mass is ?demonstrated. Unremarkable appearance of the seventh and eighth ?cranial nerves bilaterally. ?  ?There is no acute infarct. ?  ?No chronic intracranial blood products. ?  ?No extra-axial fluid collection. ?  ?No midline shift. ?  ?No pathologic intracranial enhancement identified. ?  ?Vascular: Maintained flow voids within the proximal large arterial ?vessels. ?  ?Skull and upper cervical spine: No focal suspicious marrow lesion. ?  ?Sinuses/Orbits: Visualized orbits show no acute finding. Trace ?mucosal thickening within the bilateral ethmoid sinuses. ?  ?Other: Minimal fluid in the right mastoid air cells. ?  ?Impression #2 will be called to the ordering clinician or ?representative by the Radiologist Assistant, and communication ?documented in the PACS or [employer]. ?   ?IMPRESSION: ?No cerebellopontine angle or internal auditory canal mass. ?  ?6 mm round hypoenhancing focus within the left aspect of the ?pituitary gland, highly suspicious for a pituitary microadenoma. ?Correlate wi

## 2022-01-04 NOTE — Patient Instructions (Signed)

## 2022-02-18 DIAGNOSIS — G894 Chronic pain syndrome: Secondary | ICD-10-CM | POA: Diagnosis not present

## 2022-02-18 DIAGNOSIS — M461 Sacroiliitis, not elsewhere classified: Secondary | ICD-10-CM | POA: Diagnosis not present

## 2022-02-18 DIAGNOSIS — M47816 Spondylosis without myelopathy or radiculopathy, lumbar region: Secondary | ICD-10-CM | POA: Diagnosis not present

## 2022-02-18 DIAGNOSIS — M533 Sacrococcygeal disorders, not elsewhere classified: Secondary | ICD-10-CM | POA: Diagnosis not present

## 2022-02-18 DIAGNOSIS — G5720 Lesion of femoral nerve, unspecified lower limb: Secondary | ICD-10-CM | POA: Diagnosis not present

## 2022-02-18 DIAGNOSIS — M545 Low back pain, unspecified: Secondary | ICD-10-CM | POA: Diagnosis not present

## 2022-02-18 DIAGNOSIS — M5030 Other cervical disc degeneration, unspecified cervical region: Secondary | ICD-10-CM | POA: Diagnosis not present

## 2022-02-18 DIAGNOSIS — M7912 Myalgia of auxiliary muscles, head and neck: Secondary | ICD-10-CM | POA: Diagnosis not present

## 2022-02-18 DIAGNOSIS — M47812 Spondylosis without myelopathy or radiculopathy, cervical region: Secondary | ICD-10-CM | POA: Diagnosis not present

## 2022-02-18 DIAGNOSIS — G8929 Other chronic pain: Secondary | ICD-10-CM | POA: Diagnosis not present

## 2022-02-18 DIAGNOSIS — M4802 Spinal stenosis, cervical region: Secondary | ICD-10-CM | POA: Diagnosis not present

## 2022-02-18 DIAGNOSIS — M961 Postlaminectomy syndrome, not elsewhere classified: Secondary | ICD-10-CM | POA: Diagnosis not present

## 2022-02-18 DIAGNOSIS — Z79891 Long term (current) use of opiate analgesic: Secondary | ICD-10-CM | POA: Diagnosis not present

## 2022-02-18 DIAGNOSIS — M170 Bilateral primary osteoarthritis of knee: Secondary | ICD-10-CM | POA: Diagnosis not present

## 2022-02-18 DIAGNOSIS — M5136 Other intervertebral disc degeneration, lumbar region: Secondary | ICD-10-CM | POA: Diagnosis not present

## 2022-02-18 DIAGNOSIS — M7918 Myalgia, other site: Secondary | ICD-10-CM | POA: Diagnosis not present

## 2022-02-28 ENCOUNTER — Other Ambulatory Visit: Payer: Self-pay | Admitting: Physician Assistant

## 2022-02-28 DIAGNOSIS — G629 Polyneuropathy, unspecified: Secondary | ICD-10-CM

## 2022-03-07 ENCOUNTER — Other Ambulatory Visit: Payer: Self-pay | Admitting: Physician Assistant

## 2022-04-06 DIAGNOSIS — I890 Lymphedema, not elsewhere classified: Secondary | ICD-10-CM | POA: Diagnosis not present

## 2022-04-06 DIAGNOSIS — R202 Paresthesia of skin: Secondary | ICD-10-CM | POA: Diagnosis not present

## 2022-04-06 DIAGNOSIS — R200 Anesthesia of skin: Secondary | ICD-10-CM | POA: Diagnosis not present

## 2022-04-14 ENCOUNTER — Ambulatory Visit (INDEPENDENT_AMBULATORY_CARE_PROVIDER_SITE_OTHER): Payer: Federal, State, Local not specified - PPO | Admitting: Physician Assistant

## 2022-04-14 ENCOUNTER — Encounter: Payer: Self-pay | Admitting: Physician Assistant

## 2022-04-14 VITALS — BP 136/76 | HR 75 | Temp 97.7°F | Ht 65.0 in | Wt 295.0 lb

## 2022-04-14 DIAGNOSIS — S6992XA Unspecified injury of left wrist, hand and finger(s), initial encounter: Secondary | ICD-10-CM

## 2022-04-14 DIAGNOSIS — H6981 Other specified disorders of Eustachian tube, right ear: Secondary | ICD-10-CM

## 2022-04-14 NOTE — Progress Notes (Signed)
Established patient acute visit   Patient: Pamela Esparza   DOB: 01-06-1966   56 y.o. Female  MRN: [id number] Visit Date: 04/14/2022  Chief Complaint  Patient presents with   Hand Pain   Subjective    HPI  Patient presents for left middle finger injury. Reports jammed left middle finger back in May 2023 against metal pole. Put on a splint for a week and then took it off, finger which was fine for a while with minimal discomfort until recently when it flared up. Reports has noticed some swelling and stiffness. Any movement makes the pain worse. Also reports last night started feeling right ear fullness which has been ongoing issue that comes and goes.     Medications: Outpatient Medications Prior to Visit  Medication Sig   albuterol (VENTOLIN HFA) 108 (90 Base) MCG/ACT inhaler INHALE 1 TO 2 PUFFS INTO THE LUNGS EVERY 6 HOURS AS NEEDED   ALPRAZolam (XANAX) 0.5 MG tablet Take 0.5 mg by mouth daily as needed.   amoxicillin-clavulanate (AUGMENTIN) 875-125 MG tablet Take 1 tablet by mouth 2 (two) times daily.   atorvastatin (LIPITOR) 10 MG tablet TAKE 1 TABLET(10 MG) BY MOUTH DAILY   Cholecalciferol (VITAMIN D3) 75 MCG (3000 UT) TABS Take 5,000 Int'l Units by mouth daily.   cyclobenzaprine (FLEXERIL) 5 MG tablet TAKE 1 TABLET BY MOUTH AT NIGHT FOR 1 WEEK THEN TAKE 1 TABLET BY MOUTH TWICE DAILY   DULoxetine (CYMBALTA) 60 MG capsule Take 120 mg by mouth daily.   fluticasone (FLONASE) 50 MCG/ACT nasal spray SHAKE LIQUID AND USE 1 SPRAY IN EACH NOSTRIL DAILY   gabapentin (NEURONTIN) 600 MG tablet TAKE 1 TABLET(600 MG) BY MOUTH THREE TIMES DAILY   hydroxychloroquine (PLAQUENIL) 200 MG tablet Take 200 mg by mouth 2 (two) times daily.   levothyroxine (SYNTHROID) 50 MCG tablet Take 1 tablet (50 mcg total) by mouth daily before breakfast.   meloxicam (MOBIC) 15 MG tablet Take 15 mg by mouth.   Multiple Vitamins-Minerals (CENTRUM SILVER PO) Take by mouth.   nitroGLYCERIN (NITROSTAT) 0.4 MG SL  tablet Place under the tongue.   oxyCODONE-acetaminophen (PERCOCET) 10-325 MG tablet Take 1 tablet by mouth 2 (two) times daily.   pilocarpine (SALAGEN) 5 MG tablet Take 5 mg by mouth 3 (three) times daily.   No facility-administered medications prior to visit.    Review of Systems Review of Systems:  A fourteen system review of systems was performed and found to be positive as per HPI.     Objective    BP 136/76   Pulse 75   Temp 97.7 F (36.5 C)   Ht '5\' 5"'$  (1.651 m)   Wt 295 lb (133.8 kg)   SpO2 100%   BMI 49.09 kg/m    Physical Exam  General:  Well Developed, well nourished, appropriate for stated age.  Neuro:  Alert and oriented,  extra-ocular muscles intact  HEENT:  Normocephalic, atraumatic, some fluid behind right TM, no swelling or redness of both TM's neck supple  Skin:  no gross rash, warm, pink. Cardiac:  RRR, S1 S2 MSK: tenderness of middle finger at DIP and along flexor digitorum, mild swelling noted, limited ROM with flexion due to pain Respiratory: CTA B/L  Vascular:  Ext warm, no cyanosis apprec.; cap RF less 2 sec. Psych:  No HI/SI, judgement and insight good, Euthymic mood. Full Affect.   No results found for any visits on 04/14/22.  Assessment & Plan     Patient presenting with finger  injury that occurred > 6 weeks ago without complete resolution of symptoms so will place referral to hand specialist for further evaluation. Pt is agreeable. Recommend to continue to use finger splint and apply ice as needed. Will also place ENT referral for s/sx that are suggestive of ETD. Recommend to continue with oral antihistamine and restart using nasal spray (Flonase). Pt verbalized understanding.    Return if symptoms worsen or fail to improve.        Lorrene Reid, PA-C  [facility] at [facility] [phone] (phone) [phone] (fax)  [place]

## 2022-04-14 NOTE — Patient Instructions (Signed)
Finger Sprain, Adult A finger sprain is a tear or stretch in a ligament in a finger. Ligaments are tissues that connect bones to each other. What are the causes? Finger sprains happen when something makes the bones in the hand move in an abnormal way. They are often caused by a fall or an accident. What increases the risk? Playing sports where it is easy to fall, such as skiing. Playing sports that involve catching an object, such as basketball. Not being strong or flexible. What are the signs or symptoms? Pain or tenderness in the finger. Swelling in the finger. A bluish look to the finger. Getting bruises. Trouble bending the finger. How is this treated? Treatment depends on how bad the sprain is. It may involve: Preventing the finger from moving. Your finger may be wrapped in a bandage (dressing) or splint. Your finger also may be taped to the fingers next to it (buddy taping). Medicines for pain. Exercises to strengthen the finger. Surgery to reconnect the ligament to a bone. This may be done if the ligament was torn all the way. Follow these instructions at home: If you have a splint that can be taken off:  Wear the splint as told by your doctor. Take it off only as told by your doctor. Check the skin around the splint every day. Tell your doctor if you see problems. Loosen the splint if your fingers: Tingle. Become numb. Turn cold and blue. Keep the splint clean. If the splint is not waterproof: Do not let it get wet. Cover it with a watertight covering when you take a bath or shower. Managing pain, stiffness, and swelling If told, put ice on the injured area. To do this: If you have a removable splint, take it off as told by your doctor. Put ice in a plastic bag. Place a towel between your skin and the bag. Leave the ice on for 20 minutes, 2-3 times a day. Take off the ice if your skin turns [place] red. This is very important. If you cannot feel pain, heat, or cold, you  have a greater risk of damage to the area. Move your fingers often. Raise the injured area above the level of your heart while you are sitting or lying down. Medicines Take over-the-counter and prescription medicines only as told by your doctor. Ask your doctor if you should avoid driving or using machines while you are taking your medicine. General instructions Keep any bandages dry until your doctor says they can be taken off. If your fingers are buddy taped, replace your buddy taping as told by your doctor. Do exercises as told by your doctor or physical therapist. Do not wear rings on your injured finger. Keep all follow-up visits. Contact a health care provider if: Your pain is not helped by medicine. Your bruising or swelling gets worse. Your splint is damaged. You have a fever. Get help right away if: You lose feeling in your finger. Your finger turns blue. Your finger feels colder than normal when you touch it. Summary A finger sprain is a tear or stretch in a ligament in your finger. Ligaments are tissues that connect bones to each other. If you have a splint, loosen the splint if your fingers tingle, become numb, or turn cold and blue. Move your fingers often. If told, put ice on the injured area. This information is not intended to replace advice given to you by your health care provider. Make sure you discuss any questions you have with   your health care provider. Document Revised: 08/06/2020 Document Reviewed: 08/06/2020 Elsevier Patient Education  2023 Elsevier Inc.  

## 2022-04-15 DIAGNOSIS — G894 Chronic pain syndrome: Secondary | ICD-10-CM | POA: Diagnosis not present

## 2022-04-15 DIAGNOSIS — M170 Bilateral primary osteoarthritis of knee: Secondary | ICD-10-CM | POA: Diagnosis not present

## 2022-04-15 DIAGNOSIS — M5136 Other intervertebral disc degeneration, lumbar region: Secondary | ICD-10-CM | POA: Diagnosis not present

## 2022-04-15 DIAGNOSIS — M533 Sacrococcygeal disorders, not elsewhere classified: Secondary | ICD-10-CM | POA: Diagnosis not present

## 2022-04-15 DIAGNOSIS — Z79891 Long term (current) use of opiate analgesic: Secondary | ICD-10-CM | POA: Diagnosis not present

## 2022-04-15 DIAGNOSIS — M542 Cervicalgia: Secondary | ICD-10-CM | POA: Diagnosis not present

## 2022-04-15 DIAGNOSIS — G8929 Other chronic pain: Secondary | ICD-10-CM | POA: Diagnosis not present

## 2022-04-15 DIAGNOSIS — Z79899 Other long term (current) drug therapy: Secondary | ICD-10-CM | POA: Diagnosis not present

## 2022-04-15 DIAGNOSIS — M961 Postlaminectomy syndrome, not elsewhere classified: Secondary | ICD-10-CM | POA: Diagnosis not present

## 2022-04-15 DIAGNOSIS — M7918 Myalgia, other site: Secondary | ICD-10-CM | POA: Diagnosis not present

## 2022-04-15 DIAGNOSIS — M461 Sacroiliitis, not elsewhere classified: Secondary | ICD-10-CM | POA: Diagnosis not present

## 2022-04-15 DIAGNOSIS — M47816 Spondylosis without myelopathy or radiculopathy, lumbar region: Secondary | ICD-10-CM | POA: Diagnosis not present

## 2022-04-15 DIAGNOSIS — M545 Low back pain, unspecified: Secondary | ICD-10-CM | POA: Diagnosis not present

## 2022-04-15 DIAGNOSIS — M47812 Spondylosis without myelopathy or radiculopathy, cervical region: Secondary | ICD-10-CM | POA: Diagnosis not present

## 2022-04-20 ENCOUNTER — Other Ambulatory Visit: Payer: Self-pay

## 2022-04-20 DIAGNOSIS — Z130 Encounter for screening for diseases of the blood and blood-forming organs and certain disorders involving the immune mechanism: Secondary | ICD-10-CM

## 2022-04-20 DIAGNOSIS — Z0000 Encounter for general adult medical examination without abnormal findings: Secondary | ICD-10-CM

## 2022-04-21 ENCOUNTER — Other Ambulatory Visit: Payer: Federal, State, Local not specified - PPO

## 2022-04-21 DIAGNOSIS — Z1321 Encounter for screening for nutritional disorder: Secondary | ICD-10-CM | POA: Diagnosis not present

## 2022-04-21 DIAGNOSIS — Z0000 Encounter for general adult medical examination without abnormal findings: Secondary | ICD-10-CM | POA: Diagnosis not present

## 2022-04-21 DIAGNOSIS — Z130 Encounter for screening for diseases of the blood and blood-forming organs and certain disorders involving the immune mechanism: Secondary | ICD-10-CM

## 2022-04-21 DIAGNOSIS — Z13228 Encounter for screening for other metabolic disorders: Secondary | ICD-10-CM | POA: Diagnosis not present

## 2022-04-21 DIAGNOSIS — Z1329 Encounter for screening for other suspected endocrine disorder: Secondary | ICD-10-CM | POA: Diagnosis not present

## 2022-04-22 LAB — CBC WITH DIFFERENTIAL/PLATELET
Basophils Absolute: 0.1 10*3/uL (ref 0.0–0.2)
Basos: 1 %
EOS (ABSOLUTE): 0 10*3/uL (ref 0.0–0.4)
Eos: 0 %
Hematocrit: 43.2 % (ref 34.0–46.6)
Hemoglobin: 14.4 g/dL (ref 11.1–15.9)
Immature Grans (Abs): 0.1 10*3/uL (ref 0.0–0.1)
Immature Granulocytes: 1 %
Lymphocytes Absolute: 2.6 10*3/uL (ref 0.7–3.1)
Lymphs: 29 %
MCH: 31 pg (ref 26.6–33.0)
MCHC: 33.3 g/dL (ref 31.5–35.7)
MCV: 93 fL (ref 79–97)
Monocytes Absolute: 0.5 10*3/uL (ref 0.1–0.9)
Monocytes: 5 %
Neutrophils Absolute: 5.8 10*3/uL (ref 1.4–7.0)
Neutrophils: 64 %
Platelets: 326 10*3/uL (ref 150–450)
RBC: 4.65 x10E6/uL (ref 3.77–5.28)
RDW: 12.6 % (ref 11.7–15.4)
WBC: 8.9 10*3/uL (ref 3.4–10.8)

## 2022-04-22 LAB — COMPREHENSIVE METABOLIC PANEL
ALT: 17 IU/L (ref 0–32)
AST: 16 IU/L (ref 0–40)
Albumin/Globulin Ratio: 1.6 (ref 1.2–2.2)
Albumin: 4.6 g/dL (ref 3.8–4.9)
Alkaline Phosphatase: 112 IU/L (ref 44–121)
BUN/Creatinine Ratio: 21 (ref 9–23)
BUN: 14 mg/dL (ref 6–24)
Bilirubin Total: 0.4 mg/dL (ref 0.0–1.2)
CO2: 26 mmol/L (ref 20–29)
Calcium: 9.8 mg/dL (ref 8.7–10.2)
Chloride: 102 mmol/L (ref 96–106)
Creatinine, Ser: 0.67 mg/dL (ref 0.57–1.00)
Globulin, Total: 2.9 g/dL (ref 1.5–4.5)
Glucose: 95 mg/dL (ref 70–99)
Potassium: 4.7 mmol/L (ref 3.5–5.2)
Sodium: 144 mmol/L (ref 134–144)
Total Protein: 7.5 g/dL (ref 6.0–8.5)
eGFR: 102 mL/min/{1.73_m2} (ref >59)

## 2022-04-22 LAB — LIPID PANEL
Chol/HDL Ratio: 2 ratio (ref 0.0–4.4)
Cholesterol, Total: 182 mg/dL (ref 100–199)
HDL: 93 mg/dL (ref >39)
LDL Chol Calc (NIH): 73 mg/dL (ref 0–99)
Triglycerides: 92 mg/dL (ref 0–149)
VLDL Cholesterol Cal: 16 mg/dL (ref 5–40)

## 2022-04-22 LAB — HEMOGLOBIN A1C
Est. average glucose Bld gHb Est-mCnc: 114 mg/dL
Hgb A1c MFr Bld: 5.6 % (ref 4.8–5.6)

## 2022-04-22 LAB — TSH: TSH: 4.18 u[IU]/mL (ref 0.450–4.500)

## 2022-04-26 ENCOUNTER — Other Ambulatory Visit: Payer: Federal, State, Local not specified - PPO

## 2022-04-27 ENCOUNTER — Encounter: Payer: Self-pay | Admitting: Orthopaedic Surgery

## 2022-04-27 ENCOUNTER — Ambulatory Visit (INDEPENDENT_AMBULATORY_CARE_PROVIDER_SITE_OTHER): Payer: Federal, State, Local not specified - PPO

## 2022-04-27 ENCOUNTER — Ambulatory Visit (INDEPENDENT_AMBULATORY_CARE_PROVIDER_SITE_OTHER): Payer: Federal, State, Local not specified - PPO | Admitting: Orthopaedic Surgery

## 2022-04-27 DIAGNOSIS — M79642 Pain in left hand: Secondary | ICD-10-CM

## 2022-04-27 NOTE — Progress Notes (Signed)
Office Visit Note   Patient: Pamela Esparza           Date of Birth: 05/28/1966           MRN: [id number] Visit Date: 04/27/2022              Requested by: Lorrene Reid, PA-C [place],  [place] [address] PCP: Lorrene Reid, PA-C   Assessment & Plan: Visit Diagnoses:  1. Pain in left hand     Plan: Impression is chronic left long finger sprain.  Would recommend evaluation by hand therapy.  No structural problems I can identify.  Follow-up as needed.  Follow-Up Instructions: No follow-ups on file.   Orders:  Orders Placed This Encounter  Procedures   XR Hand Complete Left   No orders of the defined types were placed in this encounter.     Procedures: No procedures performed   Clinical Data: No additional findings.   Subjective: Chief Complaint  Patient presents with   Left Hand - Pain    HPI Pamela Esparza is a very pleasant 56 year old female here for evaluation of chronic left long finger pain and stiffness.  She jammed it 3 months ago while she was on vacation in [place].  She is right-hand dominant.  Takes Percocet for chronic pain management and fibromyalgia.  Recently started wearing an AlumaFoam splint that she owns.  Feels pain along the entire finger. Review of Systems  Constitutional: Negative.   HENT: Negative.    Eyes: Negative.   Respiratory: Negative.    Cardiovascular: Negative.   Endocrine: Negative.   Musculoskeletal: Negative.   Neurological: Negative.   Hematological: Negative.   Psychiatric/Behavioral: Negative.    All other systems reviewed and are negative.    Objective: Vital Signs: There were no vitals taken for this visit.  Physical Exam Vitals and nursing note reviewed.  Constitutional:      Appearance: She is well-developed.  HENT:     Head: Atraumatic.     Nose: Nose normal.  Eyes:     Extraocular Movements: Extraocular movements intact.  Cardiovascular:     Pulses: Normal pulses.  Pulmonary:      Effort: Pulmonary effort is normal.  Abdominal:     Palpations: Abdomen is soft.  Musculoskeletal:     Cervical back: Neck supple.  Skin:    General: Skin is warm.     Capillary Refill: Capillary refill takes less than 2 seconds.  Neurological:     Mental Status: She is alert. Mental status is at baseline.  Psychiatric:        Behavior: Behavior normal.        Thought Content: Thought content normal.        Judgment: Judgment normal.     Ortho Exam Examination of the left long finger shows no significant swelling masses or lesions.  She has some slight stiffness to full flexion of the finger.  She has full extension.  Flexor and extensor functions are all intact.  Collateral ligaments are all intact.  Normal capillary refill.  Normal sensation. Specialty Comments:  No specialty comments available.  Imaging: XR Hand Complete Left  Result Date: 04/27/2022 No acute or structural abnormalities    PMFS History: Patient Active Problem List   Diagnosis Date Noted   Pituitary microadenoma ([place]) 09/14/2021   Sjogren syndrome, unspecified ([place]) 09/03/2021   Primary osteoarthritis 09/03/2021   Morbid obesity ([place]) 09/03/2021   Disorder of soft tissue 09/03/2021   Sjogren's  disease ([place]) 04/16/2021   Spondylosis of lumbar region without myelopathy or radiculopathy 02/04/2021   Body mass index (BMI) 45.0-49.9, adult ([place]) 02/02/2021   Essential (primary) hypertension 02/02/2021   Acute midline low back pain with left-sided sciatica 01/12/2021   Chronic pain syndrome 01/12/2021   Abdominal pain    Peroneal tendinitis, right leg    Constipation 08/14/2019   Multiple atypical nevi 08/14/2019   Haglund's deformity of right heel 06/25/2019   Concussion 06/21/2019   Acute right ankle pain 03/21/2019   Hypothyroidism 03/21/2019   Sore throat 01/23/2019   Acute otitis media 01/23/2019   Cough in adult 12/20/2018   Healthcare maintenance 10/23/2018   Chronic joint pain 10/23/2018    Breast cancer screening 10/23/2018   DDD (degenerative disc disease), lumbar 10/23/2018   Muscle pain, fibromyalgia 10/23/2018   Lumbosacral spondylosis without myelopathy 08/20/2015   Neuropathy 07/11/2013   Past Medical History:  Diagnosis Date   Anxiety    Arthritis    Asthma    Cataract    DDD (degenerative disc disease), lumbar    Depression    Depression    Phreesia 10/13/2020   Fibromyalgia    Hyperlipidemia    Joint pain    Neuromuscular disorder ([place])    Phreesia 10/13/2020   Neuropathy    Sjogren's disease ([place])    Thyroid disease     Family History  Problem Relation Age of Onset   Cancer Mother    Heart attack Father    Hypertension Father    Diabetes Father    Birth defects Maternal Grandmother        ovarian   Depression Maternal Grandmother    Birth defects Paternal Grandmother        brain   Colon cancer Neg Hx    Colon polyps Neg Hx    Esophageal cancer Neg Hx    Stomach cancer Neg Hx    Rectal cancer Neg Hx     Past Surgical History:  Procedure Laterality Date   ABDOMINAL HYSTERECTOMY     APPENDECTOMY     CHOLECYSTECTOMY     ESOPHAGOGASTRODUODENOSCOPY (EGD) WITH PROPOFOL N/A 12/05/2020   Procedure: ESOPHAGOGASTRODUODENOSCOPY (EGD) WITH PROPOFOL;  Surgeon: Virgel Manifold, MD;  Location: [facility] ENDOSCOPY;  Service: Endoscopy;  Laterality: N/A;   finger sugery     NECK SURGERY     TENDON TRANSFER Right 11/06/2019   Procedure: PERONEAL RECONSTRUCTION WITH PERONEAL LONGUS RIGHT LEG;  Surgeon: Newt Minion, MD;  Location: [facility];  Service: Orthopedics;  Laterality: Right;   TUBAL LIGATION N/A    Phreesia 10/13/2020   Social History   Occupational History   Not on file  Tobacco Use   Smoking status: Never   Smokeless tobacco: Never  Vaping Use   Vaping Use: Never used  Substance and Sexual Activity   Alcohol use: Yes    Comment: 4 drinks a year-rare   Drug use: Never   Sexual activity: Not Currently

## 2022-04-28 ENCOUNTER — Ambulatory Visit (INDEPENDENT_AMBULATORY_CARE_PROVIDER_SITE_OTHER): Payer: Federal, State, Local not specified - PPO | Admitting: Physician Assistant

## 2022-04-28 ENCOUNTER — Encounter: Payer: Self-pay | Admitting: Physician Assistant

## 2022-04-28 VITALS — BP 139/77 | HR 72 | Temp 97.7°F | Ht 65.0 in | Wt 291.0 lb

## 2022-04-28 DIAGNOSIS — R350 Frequency of micturition: Secondary | ICD-10-CM | POA: Diagnosis not present

## 2022-04-28 DIAGNOSIS — E785 Hyperlipidemia, unspecified: Secondary | ICD-10-CM | POA: Insufficient documentation

## 2022-04-28 DIAGNOSIS — E039 Hypothyroidism, unspecified: Secondary | ICD-10-CM

## 2022-04-28 DIAGNOSIS — G629 Polyneuropathy, unspecified: Secondary | ICD-10-CM

## 2022-04-28 DIAGNOSIS — M3500 Sicca syndrome, unspecified: Secondary | ICD-10-CM

## 2022-04-28 DIAGNOSIS — E7800 Pure hypercholesterolemia, unspecified: Secondary | ICD-10-CM | POA: Diagnosis not present

## 2022-04-28 LAB — POCT URINALYSIS DIPSTICK
Bilirubin, UA: NEGATIVE
Blood, UA: NEGATIVE
Glucose, UA: NEGATIVE
Ketones, UA: NEGATIVE
Nitrite, UA: NEGATIVE
Protein, UA: POSITIVE — AB
Spec Grav, UA: >=1.03 — AB (ref 1.010–1.025)
Urobilinogen, UA: 0.2 E.U./dL
pH, UA: 5.5 (ref 5.0–8.0)

## 2022-04-28 MED ORDER — GABAPENTIN 600 MG PO TABS: ORAL_TABLET | ORAL | 2 refills | Status: DC

## 2022-04-28 MED ORDER — FLUTICASONE PROPIONATE 50 MCG/ACT NA SUSP: NASAL | 3 refills | Status: DC

## 2022-04-28 MED ORDER — NITROFURANTOIN MONOHYD MACRO 100 MG PO CAPS: 100.0000 mg | ORAL_CAPSULE | Freq: Two times a day (BID) | ORAL | 0 refills | Status: DC

## 2022-04-28 MED ORDER — ALBUTEROL SULFATE HFA 108 (90 BASE) MCG/ACT IN AERS: INHALATION_SPRAY | RESPIRATORY_TRACT | 1 refills | Status: DC

## 2022-04-28 MED ORDER — ATORVASTATIN CALCIUM 10 MG PO TABS: ORAL_TABLET | ORAL | 2 refills | Status: DC

## 2022-04-28 NOTE — Progress Notes (Signed)
Established patient visit   Patient: Pamela Esparza   DOB: Jul 04, 1966   56 y.o. Female  MRN: [id number] Visit Date: 04/28/2022  Chief Complaint  Patient presents with   Follow-up   Subjective    HPI  Patient presents for chronic follow-up visit. Patient reports urinary frequency and urgency. No hematuria or cloudy urine. Patient reports taking Gabapentin which helps keep neuropathy tolerable. Patient taking cholesterol medication (atorvastatin). States her diet has not been so good the last couple of weeks. Reports compliance with levothyroxine.   Medications: Outpatient Medications Prior to Visit  Medication Sig   ALPRAZolam (XANAX) 0.5 MG tablet Take 0.5 mg by mouth daily as needed.   Cholecalciferol (VITAMIN D3) 75 MCG (3000 UT) TABS Take 5,000 Int'l Units by mouth daily.   cyclobenzaprine (FLEXERIL) 5 MG tablet TAKE 1 TABLET BY MOUTH AT NIGHT FOR 1 WEEK THEN TAKE 1 TABLET BY MOUTH TWICE DAILY   DULoxetine (CYMBALTA) 60 MG capsule Take 120 mg by mouth daily.   hydroxychloroquine (PLAQUENIL) 200 MG tablet Take 200 mg by mouth 2 (two) times daily.   levothyroxine (SYNTHROID) 50 MCG tablet Take 1 tablet (50 mcg total) by mouth daily before breakfast.   meloxicam (MOBIC) 15 MG tablet Take 15 mg by mouth.   Multiple Vitamins-Minerals (CENTRUM SILVER PO) Take by mouth.   nitroGLYCERIN (NITROSTAT) 0.4 MG SL tablet Place under the tongue.   oxyCODONE-acetaminophen (PERCOCET) 10-325 MG tablet Take 1 tablet by mouth 2 (two) times daily.   pilocarpine (SALAGEN) 5 MG tablet Take 5 mg by mouth 3 (three) times daily.   [DISCONTINUED] albuterol (VENTOLIN HFA) 108 (90 Base) MCG/ACT inhaler INHALE 1 TO 2 PUFFS INTO THE LUNGS EVERY 6 HOURS AS NEEDED   [DISCONTINUED] amoxicillin-clavulanate (AUGMENTIN) 875-125 MG tablet Take 1 tablet by mouth 2 (two) times daily.   [DISCONTINUED] atorvastatin (LIPITOR) 10 MG tablet TAKE 1 TABLET(10 MG) BY MOUTH DAILY   [DISCONTINUED] fluticasone (FLONASE) 50  MCG/ACT nasal spray SHAKE LIQUID AND USE 1 SPRAY IN EACH NOSTRIL DAILY   [DISCONTINUED] gabapentin (NEURONTIN) 600 MG tablet TAKE 1 TABLET(600 MG) BY MOUTH THREE TIMES DAILY   No facility-administered medications prior to visit.    Review of Systems Review of Systems:  A fourteen system review of systems was performed and found to be positive as per HPI.  Last CBC Lab Results  Component Value Date   WBC 8.9 04/21/2022   HGB 14.4 04/21/2022   HCT 43.2 04/21/2022   MCV 93 04/21/2022   MCH 31.0 04/21/2022   RDW 12.6 04/21/2022   PLT 326 52/77/8242   Last metabolic panel Lab Results  Component Value Date   GLUCOSE 95 04/21/2022   NA 144 04/21/2022   K 4.7 04/21/2022   CL 102 04/21/2022   CO2 26 04/21/2022   BUN 14 04/21/2022   CREATININE 0.67 04/21/2022   EGFR 102 04/21/2022   CALCIUM 9.8 04/21/2022   PROT 7.5 04/21/2022   ALBUMIN 4.6 04/21/2022   LABGLOB 2.9 04/21/2022   AGRATIO 1.6 04/21/2022   BILITOT 0.4 04/21/2022   ALKPHOS 112 04/21/2022   AST 16 04/21/2022   ALT 17 04/21/2022   Last lipids Lab Results  Component Value Date   CHOL 182 04/21/2022   HDL 93 04/21/2022   LDLCALC 73 04/21/2022   LDLDIRECT 146 (H) 08/14/2019   TRIG 92 04/21/2022   CHOLHDL 2.0 04/21/2022   Last hemoglobin A1c Lab Results  Component Value Date   HGBA1C 5.6 04/21/2022   Last thyroid functions  Lab Results  Component Value Date   TSH 4.180 04/21/2022   T3TOTAL 115 10/14/2020   Last vitamin D Lab Results  Component Value Date   VD25OH 50.25 09/14/2021     Objective    BP 139/77   Pulse 72   Temp 97.7 F (36.5 C)   Ht $R'5\' 5"'Zm$  (1.651 m)   Wt 291 lb (132 kg)   SpO2 98%   BMI 48.42 kg/m  BP Readings from Last 3 Encounters:  04/28/22 139/77  04/14/22 136/76  01/04/22 138/76   Wt Readings from Last 3 Encounters:  04/28/22 291 lb (132 kg)  04/14/22 295 lb (133.8 kg)  01/04/22 292 lb 9.6 oz (132.7 kg)    Physical Exam  General:  Cooperative, in no acute distress,  appropriate for stated age.  Neuro:  Alert and oriented,  extra-ocular muscles intact  HEENT:  Normocephalic, atraumatic, neck supple  Skin:  no gross rash, warm, pink. Abdomen: non-distended, no CVA tenderness Cardiac:  RRR, S1 S2 Respiratory: CTA B/L  Vascular:  Ext warm, no cyanosis apprec.; cap RF less 2 sec. Psych:  No HI/SI, judgement and insight good, Euthymic mood. Full Affect.   Results for orders placed or performed in visit on 04/28/22  POCT urinalysis dipstick  Result Value Ref Range   Color, UA     Clarity, UA     Glucose, UA Negative Negative   Bilirubin, UA neg    Ketones, UA neg    Spec Grav, UA >=1.030 (A) 1.010 - 1.025   Blood, UA neg    pH, UA 5.5 5.0 - 8.0   Protein, UA Positive (A) Negative   Urobilinogen, UA 0.2 0.2 or 1.0 E.U./dL   Nitrite, UA neg    Leukocytes, UA Trace (A) Negative   Appearance     Odor      Assessment & Plan      Problem List Items Addressed This Visit       Digestive   Sjogren syndrome, unspecified ([place])    -Followed by rheumatology.        Endocrine   Hypothyroidism - Primary (Chronic)    -Discussed recent TSH which is normal. Continue current medication regimen. Will continue to monitor.        Nervous and Auditory   Neuropathy    -Stable. Continue current medication regimen. Will continue to monitor.      Relevant Medications   gabapentin (NEURONTIN) 600 MG tablet     Other   Elevated LDL cholesterol level    -Discussed recent lipid panel and CMP which are normal. Continue current medication regimen. Recommend to follow a heart healthy diet. Will continue to monitor.      Relevant Medications   atorvastatin (LIPITOR) 10 MG tablet   Other Visit Diagnoses     Urinary frequency       Relevant Medications   nitrofurantoin, macrocrystal-monohydrate, (MACROBID) 100 MG capsule   Other Relevant Orders   POCT urinalysis dipstick (Completed)   Urine Culture      Urinary frequency: -Collected urinalysis  which is positive for trace of leukocytes and protein. Will send for urine culture and start empiric antibiotic therapy with Macrobid 100 mg BID x 5 days. Pending urine culture will adjust treatment plan if indicated. Recommend to stay hydrated.  Discussed with patient most recent labs which are essentially normal or stable from prior. A1c has gradually improved and within normal range.   Return in about 4 months (around 08/28/2022) for neuroapthy, HLD,  HTN.        Lorrene Reid, PA-C  [facility] [phone] (phone) [phone] (fax)  [place]

## 2022-04-28 NOTE — Assessment & Plan Note (Signed)
-  Stable. -Continue current medication regimen.  -Will continue to monitor. 

## 2022-04-28 NOTE — Patient Instructions (Signed)

## 2022-04-28 NOTE — Assessment & Plan Note (Signed)
-  Discussed recent TSH which is normal. Continue current medication regimen. Will continue to monitor.

## 2022-04-28 NOTE — Assessment & Plan Note (Signed)
-  Discussed recent lipid panel and CMP which are normal. Continue current medication regimen. Recommend to follow a heart healthy diet. Will continue to monitor.

## 2022-04-28 NOTE — Assessment & Plan Note (Signed)
Followed by rheumatology. 

## 2022-04-30 LAB — URINE CULTURE

## 2022-05-03 ENCOUNTER — Telehealth: Payer: Self-pay | Admitting: Physician Assistant

## 2022-05-03 DIAGNOSIS — J019 Acute sinusitis, unspecified: Secondary | ICD-10-CM

## 2022-05-03 MED ORDER — AZITHROMYCIN 250 MG PO TABS: ORAL_TABLET | ORAL | 0 refills | Status: AC

## 2022-05-03 NOTE — Telephone Encounter (Signed)
Patient called and wanted an acute appointment for sinus infection however there isn't anything until Thursday. She is requesting something to be called in to her pharmacy. Please advise.

## 2022-05-03 NOTE — Telephone Encounter (Signed)
Patient aware.

## 2022-05-10 ENCOUNTER — Telehealth: Payer: Self-pay | Admitting: Physician Assistant

## 2022-05-10 NOTE — Telephone Encounter (Signed)
Patient aware.

## 2022-05-10 NOTE — Telephone Encounter (Signed)
Patient called and stated she was told to call back this morning if no better and she isn't any better however there are no openings. Can the antibiotic be changed because she said the 250 mg does not work for her.

## 2022-05-10 NOTE — Telephone Encounter (Signed)
Per Herb Grays patient would need to be seen again before any medication changes are made. Patient can schedule to next available apt or if unable to wait, can go to UC. AS, CMA

## 2022-05-11 ENCOUNTER — Ambulatory Visit (INDEPENDENT_AMBULATORY_CARE_PROVIDER_SITE_OTHER): Payer: Federal, State, Local not specified - PPO | Admitting: Physician Assistant

## 2022-05-11 ENCOUNTER — Encounter: Payer: Self-pay | Admitting: Physician Assistant

## 2022-05-11 VITALS — BP 124/83 | HR 82 | Temp 97.7°F | Ht 65.0 in | Wt 291.0 lb

## 2022-05-11 DIAGNOSIS — J0141 Acute recurrent pansinusitis: Secondary | ICD-10-CM | POA: Diagnosis not present

## 2022-05-11 MED ORDER — METHYLPREDNISOLONE ACETATE 40 MG/ML IJ SUSP
40.0000 mg | Freq: Once | INTRAMUSCULAR | Status: AC
  Administered 2022-05-11: 40 mg via INTRAMUSCULAR

## 2022-05-11 MED ORDER — DOXYCYCLINE HYCLATE 100 MG PO TABS: 100.0000 mg | ORAL_TABLET | Freq: Two times a day (BID) | ORAL | 0 refills | Status: DC

## 2022-05-11 MED ORDER — CEFTRIAXONE SODIUM 1 G IJ SOLR
1.0000 g | Freq: Once | INTRAMUSCULAR | Status: AC
  Administered 2022-05-11: 1 g via INTRAMUSCULAR

## 2022-05-11 NOTE — Progress Notes (Signed)
Established patient acute visit   Patient: Pamela Esparza   DOB: 08-03-1966   56 y.o. Female  MRN: [id number] Visit Date: 05/11/2022  No chief complaint on file.  Subjective    HPI  Patient presents for c/o sinus pressure, headache, ear pressure, chest heaviness. Denies fever, chills or night sweats. Patient reports symptoms have not improved since May. Reports is frustrated because Z-pack has not worked in the past and has mentioned that before and was prescribed that antibiotic anyway.      Medications: Outpatient Medications Prior to Visit  Medication Sig   albuterol (VENTOLIN HFA) 108 (90 Base) MCG/ACT inhaler INHALE 1 TO 2 PUFFS INTO THE LUNGS EVERY 6 HOURS AS NEEDED   ALPRAZolam (XANAX) 0.5 MG tablet Take 0.5 mg by mouth daily as needed.   atorvastatin (LIPITOR) 10 MG tablet TAKE 1 TABLET(10 MG) BY MOUTH DAILY   Cholecalciferol (VITAMIN D3) 75 MCG (3000 UT) TABS Take 5,000 Int'l Units by mouth daily.   cyclobenzaprine (FLEXERIL) 5 MG tablet TAKE 1 TABLET BY MOUTH AT NIGHT FOR 1 WEEK THEN TAKE 1 TABLET BY MOUTH TWICE DAILY   DULoxetine (CYMBALTA) 60 MG capsule Take 120 mg by mouth daily.   fluticasone (FLONASE) 50 MCG/ACT nasal spray SHAKE LIQUID AND USE 1 SPRAY IN EACH NOSTRIL DAILY   gabapentin (NEURONTIN) 600 MG tablet TAKE 1 TABLET(600 MG) BY MOUTH THREE TIMES DAILY   hydroxychloroquine (PLAQUENIL) 200 MG tablet Take 200 mg by mouth 2 (two) times daily.   levothyroxine (SYNTHROID) 50 MCG tablet Take 1 tablet (50 mcg total) by mouth daily before breakfast.   meloxicam (MOBIC) 15 MG tablet Take 15 mg by mouth.   Multiple Vitamins-Minerals (CENTRUM SILVER PO) Take by mouth.   nitrofurantoin, macrocrystal-monohydrate, (MACROBID) 100 MG capsule Take 1 capsule (100 mg total) by mouth 2 (two) times daily.   nitroGLYCERIN (NITROSTAT) 0.4 MG SL tablet Place under the tongue.   oxyCODONE-acetaminophen (PERCOCET) 10-325 MG tablet Take 1 tablet by mouth 2 (two) times daily.    pilocarpine (SALAGEN) 5 MG tablet Take 5 mg by mouth 3 (three) times daily.   No facility-administered medications prior to visit.    Review of Systems Review of Systems:  A fourteen system review of systems was performed and found to be positive as per HPI.  Last CBC Lab Results  Component Value Date   WBC 8.9 04/21/2022   HGB 14.4 04/21/2022   HCT 43.2 04/21/2022   MCV 93 04/21/2022   MCH 31.0 04/21/2022   RDW 12.6 04/21/2022   PLT 326 12/75/1700   Last metabolic panel Lab Results  Component Value Date   GLUCOSE 95 04/21/2022   NA 144 04/21/2022   K 4.7 04/21/2022   CL 102 04/21/2022   CO2 26 04/21/2022   BUN 14 04/21/2022   CREATININE 0.67 04/21/2022   EGFR 102 04/21/2022   CALCIUM 9.8 04/21/2022   PROT 7.5 04/21/2022   ALBUMIN 4.6 04/21/2022   LABGLOB 2.9 04/21/2022   AGRATIO 1.6 04/21/2022   BILITOT 0.4 04/21/2022   ALKPHOS 112 04/21/2022   AST 16 04/21/2022   ALT 17 04/21/2022   Last lipids Lab Results  Component Value Date   CHOL 182 04/21/2022   HDL 93 04/21/2022   LDLCALC 73 04/21/2022   LDLDIRECT 146 (H) 08/14/2019   TRIG 92 04/21/2022   CHOLHDL 2.0 04/21/2022   Last hemoglobin A1c Lab Results  Component Value Date   HGBA1C 5.6 04/21/2022   Last thyroid functions Lab Results  Component Value Date   TSH 4.180 04/21/2022   T3TOTAL 115 10/14/2020   Last vitamin D Lab Results  Component Value Date   VD25OH 50.25 09/14/2021       Objective    BP 124/83   Pulse 82   Temp 97.7 F (36.5 C)   Ht _0  (1.651 m)   Wt 291 lb (132 kg)   SpO2 97%   BMI 48.42 kg/m    Physical Exam  General:  Cooperative, in no acute distress, appropriate for stated age.  Neuro:  Alert and oriented,  extra-ocular muscles intact  HEENT:  Normocephalic, atraumatic, sinus pressure of maxillary and frontal sinus, fluid behind both TM's, no redness or swelling, boggy turbinates of right nostril, mild erythema of posterior oropharynx, neck supple  Skin:  no  gross rash, warm, pink. Cardiac:  RRR Respiratory: CTA B/L w/o wheezing, crackles or rales. Dec air movement.  Vascular:  Ext warm, no cyanosis apprec. Psych:  No HI/SI, judgement and insight good, Euthymic mood. Full Affect.   No results found for any visits on 05/11/22.  Assessment & Plan     Patient with hx of recurrent sinusitis. Patient was treated with Augmentin 875-125 mg BID x 10 days April 2023 and most recently patient contacted the office 05/03/22 for c/o sinus infection and due to no availability sent rx for Zpack with recommendations to follow-up if symptoms failed to improve or worsen. Discussed with patient Zpack is a common antibiotic for sinus infection and if her symptoms are severe/significant then she needed to be seen for appropriate evaluation and treatment. Will administer ceftriaxone 1 gram and Depo-Medrol 40 mg in office. Advised to start oral antibiotic- doxycycline 100 mg BID x 7 days tomorrow. Recommend to continue with supportive care including decongestant, nasal spray and recommend ENT consult for chronic congestion and recurrent sinusitis.    Return if symptoms worsen or fail to improve.        Lorrene Reid, PA-C  [facility] [phone] (phone) [phone] (fax)  [place]

## 2022-05-11 NOTE — Patient Instructions (Signed)

## 2022-05-18 ENCOUNTER — Telehealth: Payer: Self-pay | Admitting: Physician Assistant

## 2022-05-18 DIAGNOSIS — B379 Candidiasis, unspecified: Secondary | ICD-10-CM

## 2022-05-18 MED ORDER — FLUCONAZOLE 150 MG PO TABS: 150.0000 mg | ORAL_TABLET | Freq: Once | ORAL | 0 refills | Status: AC

## 2022-05-18 NOTE — Telephone Encounter (Signed)
Patient has a yeast infection due to antibiotics and wants to know if you can send her in diflucan?

## 2022-05-18 NOTE — Telephone Encounter (Signed)
Patient aware.

## 2022-05-26 DIAGNOSIS — R202 Paresthesia of skin: Secondary | ICD-10-CM | POA: Diagnosis not present

## 2022-05-26 DIAGNOSIS — R200 Anesthesia of skin: Secondary | ICD-10-CM | POA: Diagnosis not present

## 2022-06-14 DIAGNOSIS — M47812 Spondylosis without myelopathy or radiculopathy, cervical region: Secondary | ICD-10-CM | POA: Diagnosis not present

## 2022-06-14 DIAGNOSIS — M47816 Spondylosis without myelopathy or radiculopathy, lumbar region: Secondary | ICD-10-CM | POA: Diagnosis not present

## 2022-06-14 DIAGNOSIS — M533 Sacrococcygeal disorders, not elsewhere classified: Secondary | ICD-10-CM | POA: Diagnosis not present

## 2022-06-14 DIAGNOSIS — M545 Low back pain, unspecified: Secondary | ICD-10-CM | POA: Diagnosis not present

## 2022-06-14 DIAGNOSIS — M25559 Pain in unspecified hip: Secondary | ICD-10-CM | POA: Diagnosis not present

## 2022-06-14 DIAGNOSIS — M5030 Other cervical disc degeneration, unspecified cervical region: Secondary | ICD-10-CM | POA: Diagnosis not present

## 2022-06-14 DIAGNOSIS — M7918 Myalgia, other site: Secondary | ICD-10-CM | POA: Diagnosis not present

## 2022-06-14 DIAGNOSIS — G894 Chronic pain syndrome: Secondary | ICD-10-CM | POA: Diagnosis not present

## 2022-06-14 DIAGNOSIS — M461 Sacroiliitis, not elsewhere classified: Secondary | ICD-10-CM | POA: Diagnosis not present

## 2022-06-14 DIAGNOSIS — M4802 Spinal stenosis, cervical region: Secondary | ICD-10-CM | POA: Diagnosis not present

## 2022-06-14 DIAGNOSIS — M961 Postlaminectomy syndrome, not elsewhere classified: Secondary | ICD-10-CM | POA: Diagnosis not present

## 2022-06-14 DIAGNOSIS — Z79891 Long term (current) use of opiate analgesic: Secondary | ICD-10-CM | POA: Diagnosis not present

## 2022-06-14 DIAGNOSIS — M792 Neuralgia and neuritis, unspecified: Secondary | ICD-10-CM | POA: Diagnosis not present

## 2022-06-14 DIAGNOSIS — M170 Bilateral primary osteoarthritis of knee: Secondary | ICD-10-CM | POA: Diagnosis not present

## 2022-06-14 DIAGNOSIS — G8929 Other chronic pain: Secondary | ICD-10-CM | POA: Diagnosis not present

## 2022-06-14 DIAGNOSIS — M5136 Other intervertebral disc degeneration, lumbar region: Secondary | ICD-10-CM | POA: Diagnosis not present

## 2022-06-14 DIAGNOSIS — M179 Osteoarthritis of knee, unspecified: Secondary | ICD-10-CM | POA: Diagnosis not present

## 2022-06-15 ENCOUNTER — Other Ambulatory Visit: Payer: Self-pay | Admitting: Neurology

## 2022-06-15 DIAGNOSIS — R200 Anesthesia of skin: Secondary | ICD-10-CM

## 2022-06-16 ENCOUNTER — Other Ambulatory Visit: Payer: Self-pay | Admitting: Neurology

## 2022-06-16 DIAGNOSIS — R200 Anesthesia of skin: Secondary | ICD-10-CM

## 2022-06-17 DIAGNOSIS — M7989 Other specified soft tissue disorders: Secondary | ICD-10-CM | POA: Diagnosis not present

## 2022-06-17 DIAGNOSIS — M1991 Primary osteoarthritis, unspecified site: Secondary | ICD-10-CM | POA: Diagnosis not present

## 2022-06-17 DIAGNOSIS — M797 Fibromyalgia: Secondary | ICD-10-CM | POA: Diagnosis not present

## 2022-06-17 DIAGNOSIS — R21 Rash and other nonspecific skin eruption: Secondary | ICD-10-CM | POA: Diagnosis not present

## 2022-06-17 DIAGNOSIS — Z6841 Body Mass Index (BMI) 40.0 and over, adult: Secondary | ICD-10-CM | POA: Diagnosis not present

## 2022-06-17 DIAGNOSIS — M3500 Sicca syndrome, unspecified: Secondary | ICD-10-CM | POA: Diagnosis not present

## 2022-06-17 IMAGING — CT CT HEART MORP W/ CTA COR W/ SCORE W/ CA W/CM &/OR W/O CM
4 of 7 series · 8 of 20 positions shown, 9 images · IV contrast (APPLIED)
Comparison: None.
COMPARISON: None.

Addendum:
EXAM:
OVER-READ INTERPRETATION  CT CHEST

The following report is an over-read performed by radiologist Dr.
Emanuela Pastora [[email or web address]] on 11/28/2020. This over-read
does not include interpretation of cardiac or coronary anatomy or
pathology. The coronary CTA interpretation by the cardiologist is
attached.
CLINICAL DATA: 54F with asthma, hyperlipidemia, fibromyalgia, and
[place] with exertional dyspnea.
Cardiac/Coronary CT
TECHNIQUE: The patient was scanned on a [employer] Force scanner.

[Series 6: best diast 70 % · axial · 0.40mm/px · z∈[+1238,+1283]mm · 2 of 333 slices shown]
[im 111/333  vessel]
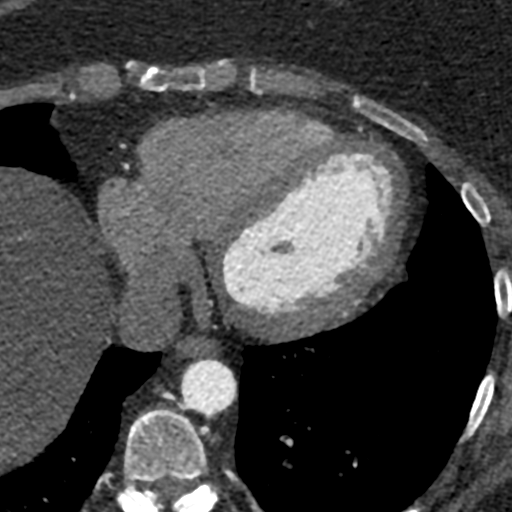
[im 222/333  vessel]
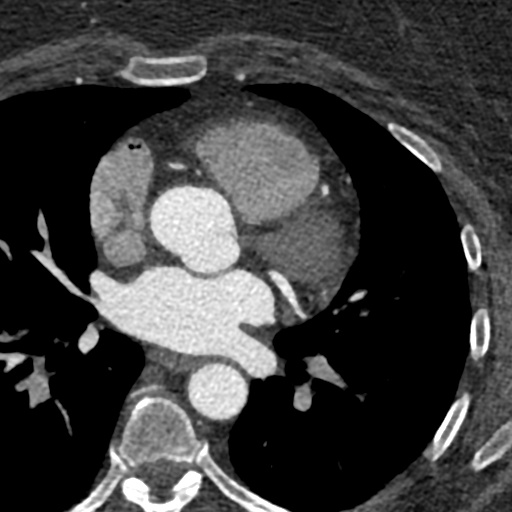

[Series 7: best syst · axial · 0.40mm/px · z∈[+1238,+1283]mm · 2 of 333 slices shown, 3 images]
[im 111/333  vessel]
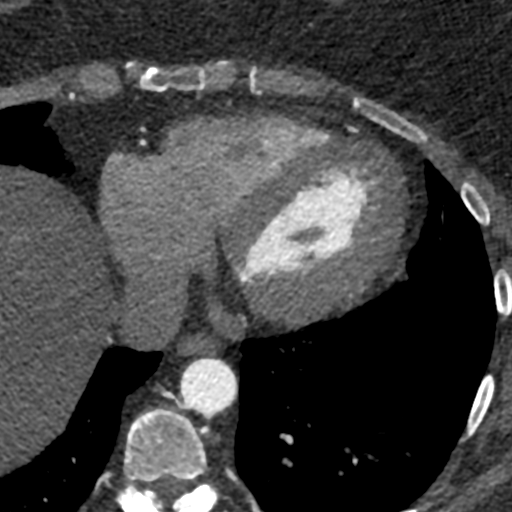
[im 111/333  lung]
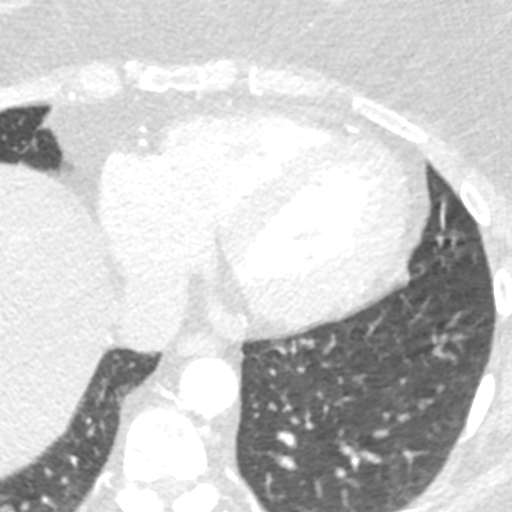
[im 222/333  vessel]
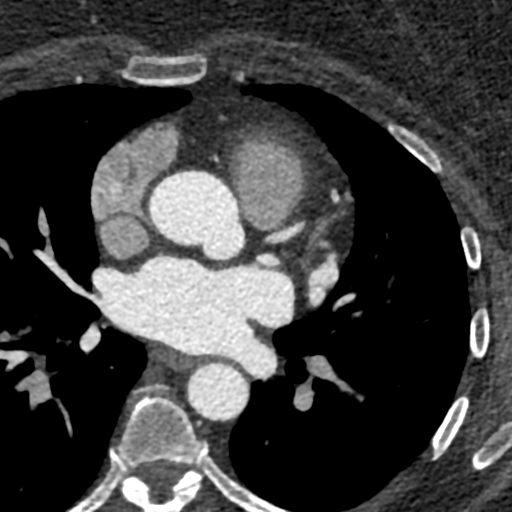

[Series 8: ts diast sharp 70 % · axial · 0.40mm/px · z∈[+1238,+1283]mm · 2 of 333 slices shown]
[im 111/333  lung]
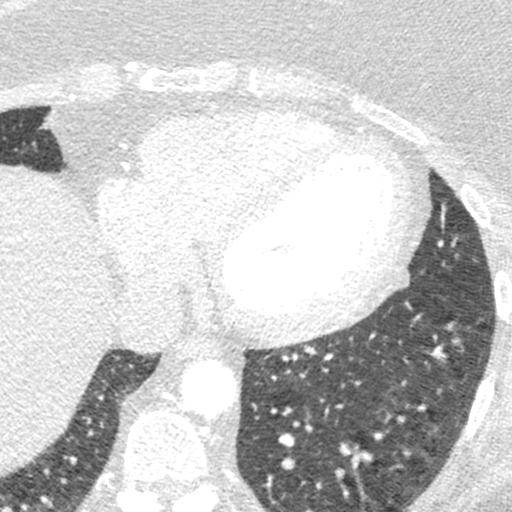
[im 222/333  lung]
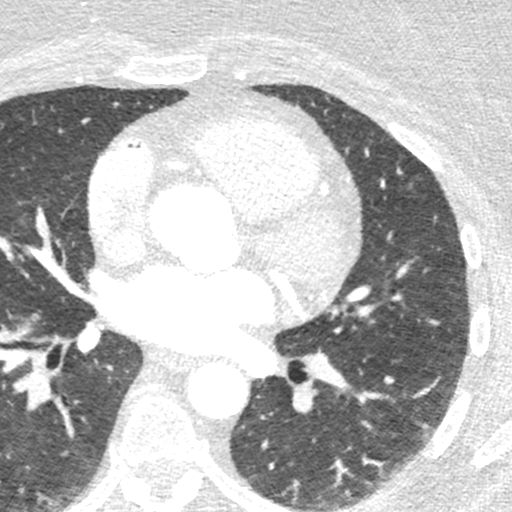

[Series 9: ts syst sharp · axial · 0.40mm/px · z∈[+1238,+1283]mm · 2 of 333 slices shown]
[im 111/333  lung]
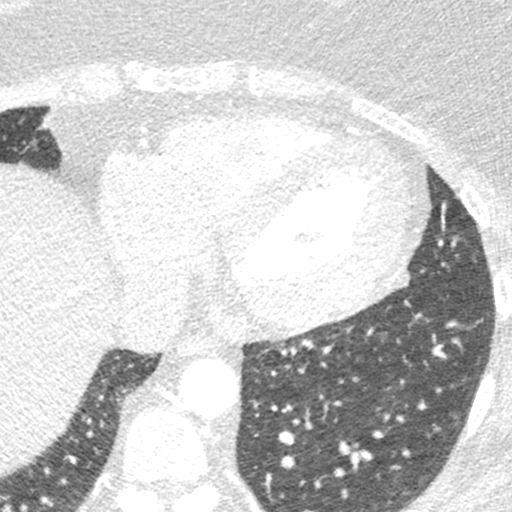
[im 222/333  lung]
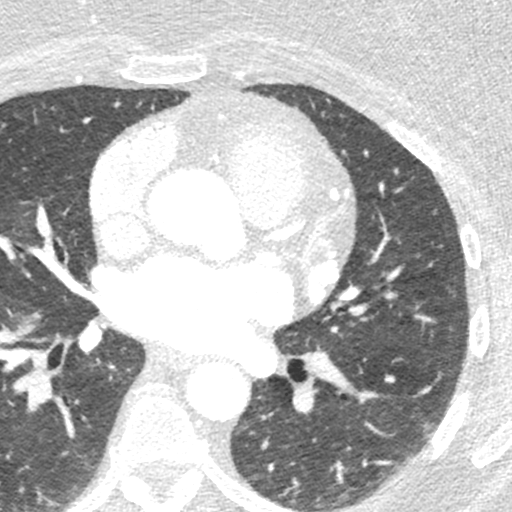

[8 of 20 positions shown; findings below may reference images not displayed]

FINDINGS: Vascular: Aortic atherosclerosis. No central pulmonary embolism, on
this non-dedicated study.

Mediastinum/Nodes: No imaged thoracic adenopathy.

Lungs/Pleura: No pleural fluid.  Clear imaged lungs.

Upper Abdomen: Hepatic steatosis. Normal imaged portions of the
spleen, stomach.

Musculoskeletal: No acute osseous abnormality.
IMPRESSION: 1.  No acute findings in the imaged extracardiac chest.
2.  Aortic Atherosclerosis (IE7DM-4UI.I).
3. Hepatic steatosis
FINDINGS: A 120 kV prospective scan was triggered in the descending thoracic
aorta at 111 HU's. Axial non-contrast 3 mm slices were carried out
through the heart. The data set was analyzed on a dedicated work
station and scored using the Agatson method. Gantry rotation speed
was 250 msecs and collimation was .6 mm. No beta blockade and 0.8 mg
of sl NTG was given. The 3D data set was reconstructed in 5%
intervals of the 67-82 % of the R-R cycle. Diastolic phases were
analyzed on a dedicated work station using MPR, MIP and VRT modes.
The patient received 80 cc of contrast.

Aorta: Normal size.  No calcifications.  No dissection.

Aortic Valve:  Trileaflet.  No calcifications.

Coronary Arteries:  Normal coronary origin.  Right dominance.

RCA is a large dominant artery that gives rise to PDA and PLVB.
There is no plaque.

Left main is a large artery that gives rise to LAD and LCX arteries.

LAD is a large vessel that has no plaque. There are three small
diagonal vessels without plaque.

LCX is a non-dominant artery that gives rise to one large OM1 branch
and two small diagonal branches. There is no plaque.

Other findings:

Normal pulmonary vein drainage into the left atrium.

Normal let atrial appendage without a thrombus.

Normal size of the pulmonary artery.
IMPRESSION: 1. Coronary calcium score of 0. This was 0 percentile for age and
sex matched control.

2. Normal coronary origin with right dominance.

3. No evidence of CAD.

*** End of Addendum ***
EXAM:
OVER-READ INTERPRETATION  CT CHEST

The following report is an over-read performed by radiologist Dr.
Emanuela Pastora [REDACTED] on 11/28/2020. This over-read
does not include interpretation of cardiac or coronary anatomy or
pathology. The coronary CTA interpretation by the cardiologist is
attached.
FINDINGS: Vascular: Aortic atherosclerosis. No central pulmonary embolism, on
this non-dedicated study.

Mediastinum/Nodes: No imaged thoracic adenopathy.

Lungs/Pleura: No pleural fluid.  Clear imaged lungs.

Upper Abdomen: Hepatic steatosis. Normal imaged portions of the
spleen, stomach.

Musculoskeletal: No acute osseous abnormality.
IMPRESSION: 1.  No acute findings in the imaged extracardiac chest.
2.  Aortic Atherosclerosis (IE7DM-4UI.I).
3. Hepatic steatosis

## 2022-07-04 ENCOUNTER — Ambulatory Visit
Admission: RE | Admit: 2022-07-04 | Discharge: 2022-07-04 | Disposition: A | Discharge status: Home / Self Care | Payer: Federal, State, Local not specified - PPO | Source: Ambulatory Visit | Attending: Neurology | Admitting: Neurology

## 2022-07-04 DIAGNOSIS — M5127 Other intervertebral disc displacement, lumbosacral region: Secondary | ICD-10-CM | POA: Diagnosis not present

## 2022-07-04 DIAGNOSIS — R200 Anesthesia of skin: Secondary | ICD-10-CM

## 2022-07-04 DIAGNOSIS — M546 Pain in thoracic spine: Secondary | ICD-10-CM | POA: Diagnosis not present

## 2022-07-14 DIAGNOSIS — J324 Chronic pansinusitis: Secondary | ICD-10-CM | POA: Diagnosis not present

## 2022-07-14 DIAGNOSIS — J310 Chronic rhinitis: Secondary | ICD-10-CM | POA: Diagnosis not present

## 2022-07-14 DIAGNOSIS — H9313 Tinnitus, bilateral: Secondary | ICD-10-CM | POA: Diagnosis not present

## 2022-07-14 DIAGNOSIS — H903 Sensorineural hearing loss, bilateral: Secondary | ICD-10-CM | POA: Diagnosis not present

## 2022-07-14 DIAGNOSIS — J343 Hypertrophy of nasal turbinates: Secondary | ICD-10-CM | POA: Diagnosis not present

## 2022-07-22 ENCOUNTER — Other Ambulatory Visit: Payer: Self-pay | Admitting: Physician Assistant

## 2022-07-22 DIAGNOSIS — Z1231 Encounter for screening mammogram for malignant neoplasm of breast: Secondary | ICD-10-CM

## 2022-07-26 ENCOUNTER — Other Ambulatory Visit: Payer: Self-pay | Admitting: Otolaryngology

## 2022-07-26 DIAGNOSIS — J329 Chronic sinusitis, unspecified: Secondary | ICD-10-CM

## 2022-08-04 ENCOUNTER — Ambulatory Visit
Admission: RE | Admit: 2022-08-04 | Discharge: 2022-08-04 | Disposition: A | Discharge status: Home / Self Care | Payer: Federal, State, Local not specified - PPO | Source: Ambulatory Visit | Attending: Otolaryngology | Admitting: Otolaryngology

## 2022-08-04 DIAGNOSIS — J329 Chronic sinusitis, unspecified: Secondary | ICD-10-CM | POA: Diagnosis not present

## 2022-08-04 DIAGNOSIS — J342 Deviated nasal septum: Secondary | ICD-10-CM | POA: Diagnosis not present

## 2022-08-04 DIAGNOSIS — J341 Cyst and mucocele of nose and nasal sinus: Secondary | ICD-10-CM | POA: Diagnosis not present

## 2022-08-04 IMAGING — CR DG LUMBAR SPINE 2-3V
3 series · 3 of 3 positions shown · non-contrast
Comparison: None.

CLINICAL DATA: Severe low back and left leg pain for the past few
weeks. No injury.

EXAM:
LUMBAR SPINE - 2-3 VIEW

[t l-spine a.p.]
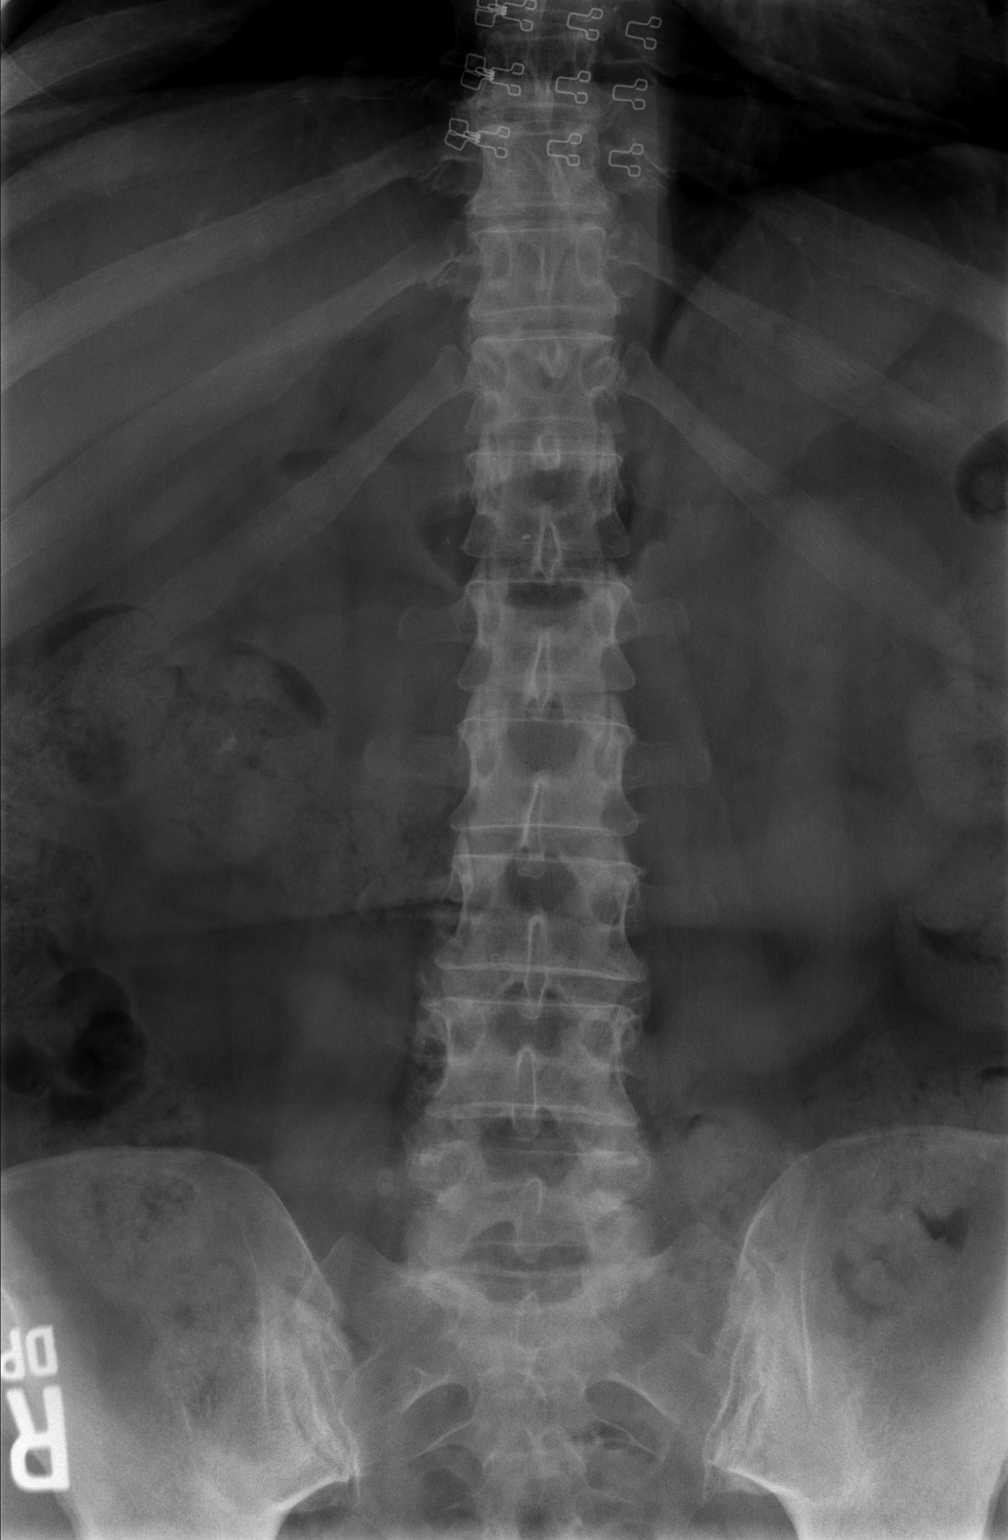

[t l-spine lat]
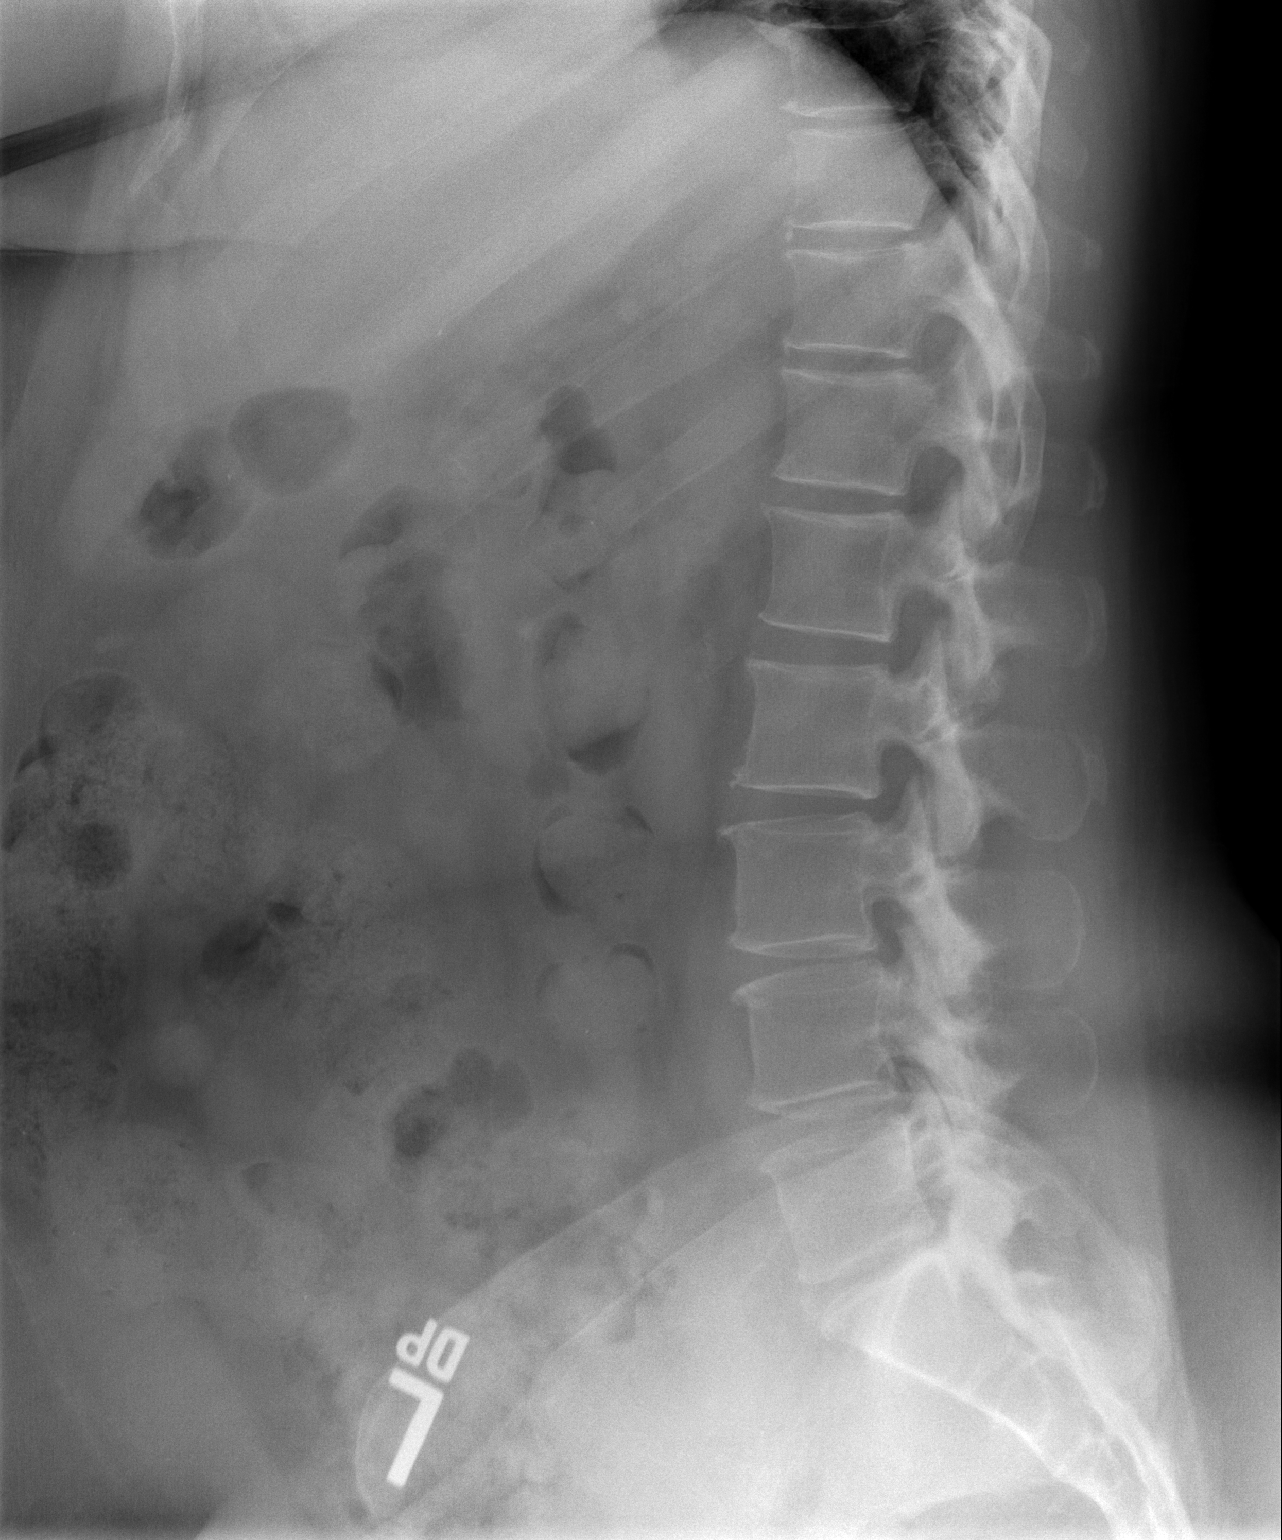

[t l-spine l5-s1 spot]
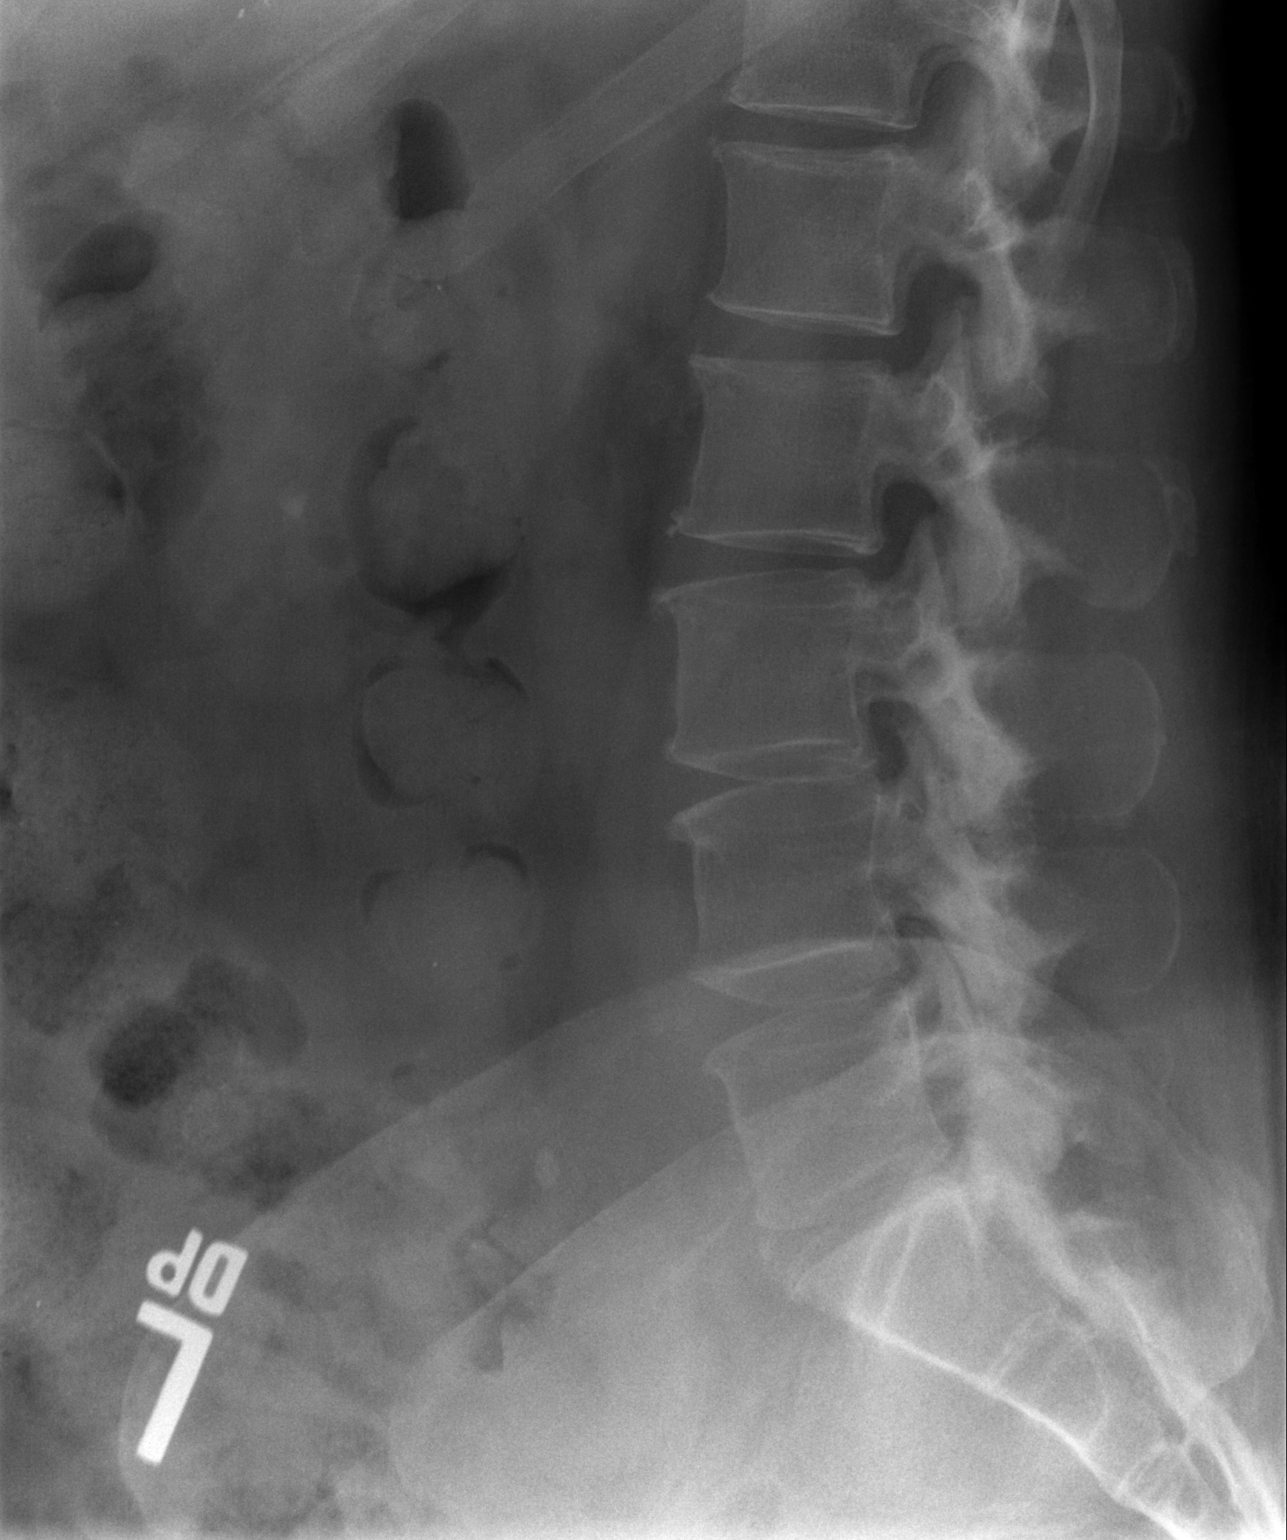

[3 of 3 positions shown; findings below may reference images not displayed]

FINDINGS: Five lumbar type vertebral bodies.  Hypoplastic ribs at T12.

No acute fracture or subluxation. Vertebral body heights are
preserved.

Alignment is normal.

Mild disc height loss from L2-L3 through L5-S1. Advanced facet
arthropathy from L3-L4 through L5-S1.

The sacroiliac joints are unremarkable.
IMPRESSION: 1. Mild multilevel degenerative disc disease and advanced lower
lumbar facet arthropathy.

## 2022-08-05 ENCOUNTER — Ambulatory Visit
Admission: RE | Admit: 2022-08-05 | Discharge: 2022-08-05 | Disposition: A | Discharge status: Home / Self Care | Payer: Federal, State, Local not specified - PPO | Source: Ambulatory Visit | Attending: Physician Assistant | Admitting: Physician Assistant

## 2022-08-05 DIAGNOSIS — Z1231 Encounter for screening mammogram for malignant neoplasm of breast: Secondary | ICD-10-CM

## 2022-08-06 ENCOUNTER — Encounter: Payer: Self-pay | Admitting: Internal Medicine

## 2022-08-06 ENCOUNTER — Ambulatory Visit (INDEPENDENT_AMBULATORY_CARE_PROVIDER_SITE_OTHER): Payer: Federal, State, Local not specified - PPO | Admitting: Internal Medicine

## 2022-08-06 VITALS — BP 122/70 | HR 90 | Ht 65.0 in | Wt 295.0 lb

## 2022-08-06 DIAGNOSIS — D352 Benign neoplasm of pituitary gland: Secondary | ICD-10-CM

## 2022-08-06 DIAGNOSIS — E039 Hypothyroidism, unspecified: Secondary | ICD-10-CM | POA: Diagnosis not present

## 2022-08-06 LAB — BASIC METABOLIC PANEL
BUN: 12 mg/dL (ref 6–23)
CO2: 30 mEq/L (ref 19–32)
Calcium: 9.1 mg/dL (ref 8.4–10.5)
Chloride: 104 mEq/L (ref 96–112)
Creatinine, Ser: 0.68 mg/dL (ref 0.40–1.20)
GFR: 96.38 mL/min (ref >60.00)
Glucose, Bld: 131 mg/dL — ABNORMAL HIGH (ref 70–99)
Potassium: 3.8 mEq/L (ref 3.5–5.1)
Sodium: 139 mEq/L (ref 135–145)

## 2022-08-06 LAB — T4, FREE: Free T4: 0.94 ng/dL (ref 0.60–1.60)

## 2022-08-06 LAB — TSH: TSH: 3.48 u[IU]/mL (ref 0.35–5.50)

## 2022-08-06 MED ORDER — LEVOTHYROXINE SODIUM 50 MCG PO TABS: 50.0000 ug | ORAL_TABLET | Freq: Every day | ORAL | 3 refills | Status: DC

## 2022-08-06 NOTE — Progress Notes (Signed)
Name: Pamela Esparza  MRN/ DOB: [id number], 12-13-1965    Age/ Sex: 56 y.o., female    PCP: Lorrene Reid, PA-C   Reason for Endocrinology Evaluation: Pituitary microadenoma     Date of Initial Endocrinology Evaluation: 09/09/2021    HPI: Pamela Esparza is a 56 y.o. female with a past medical history of hypothyroid, Sjogren's syndrome, and HTN. The patient presented for initial endocrinology clinic visit on 09/09/2021 for consultative assistance with her pituitary microadenoma.   During evaluation for hearing loss through ENT the patient was noted to have 6 mm pituitary adenoma on MRI dated 07/02/2021  Hormonal testing was normal including IGF-1, 24-hour urinary cortisol 16.7 mcg, TFTs, prolactin and ACTH with cortisol (08/2021)   She was diagnosed with  hypothyroidism ~2020 . Has been on Lt-4 replacement since then   SUBJECTIVE:    Today (08/06/22): [name] is here for follow-up on pituitary microadenoma, prediabetes and hypothyroidism .   Weight has been fluctuating  Has alternating bowel movement  Denies  Palpitations  Has LE edema - stable  No change in ring size or show size  , has hand swelling  She occasional headaches  Has noted Vision changes   She had glucocorticoid injection in the past few months, unsure exact date     Levothyroxine 50 mcg daily   HISTORY:  Past Medical History:  Past Medical History:  Diagnosis Date   Anxiety    Arthritis    Asthma    Cataract    DDD (degenerative disc disease), lumbar    Depression    Depression    Phreesia 10/13/2020   Fibromyalgia    Hyperlipidemia    Joint pain    Neuromuscular disorder ([place])    Phreesia 10/13/2020   Neuropathy    Sjogren's disease ([place])    Thyroid disease    Past Surgical History:  Past Surgical History:  Procedure Laterality Date   ABDOMINAL HYSTERECTOMY     APPENDECTOMY     CHOLECYSTECTOMY     ESOPHAGOGASTRODUODENOSCOPY (EGD) WITH PROPOFOL N/A 12/05/2020    Procedure: ESOPHAGOGASTRODUODENOSCOPY (EGD) WITH PROPOFOL;  Surgeon: Virgel Manifold, MD;  Location: [facility];  Service: Endoscopy;  Laterality: N/A;   finger sugery     NECK SURGERY     TENDON TRANSFER Right 11/06/2019   Procedure: PERONEAL RECONSTRUCTION WITH PERONEAL LONGUS RIGHT LEG;  Surgeon: Newt Minion, MD;  Location: [facility];  Service: Orthopedics;  Laterality: Right;   TUBAL LIGATION N/A    Phreesia 10/13/2020    Social History:  reports that she has never smoked. She has never used smokeless tobacco. She reports current alcohol use. She reports that she does not use drugs. Family History: family history includes Birth defects in her maternal grandmother and paternal grandmother; Cancer in her mother; Depression in her maternal grandmother; Diabetes in her father; Heart attack in her father; Hypertension in her father.   HOME MEDICATIONS: Allergies as of 08/06/2022       Reactions   Aspirin Other (See Comments)   stomach upset stomach upset   Hydrocodone-acetaminophen Other (See Comments)   made her sick made her sick   Azithromycin    Patient states it does not work for her   Codeine    Other reaction(s): Upset stomach (finding)   Lyrica [pregabalin]    Solu-medrol [methylprednisolone] Itching   Topiramate         Medication List        Accurate  as of August 06, 2022 10:07 AM. If you have any questions, ask your nurse or doctor.          STOP taking these medications    doxycycline 100 MG tablet Commonly known as: VIBRA-TABS Stopped by: Dorita Sciara, MD   nitrofurantoin (macrocrystal-monohydrate) 100 MG capsule Commonly known as: Macrobid Stopped by: Dorita Sciara, MD       TAKE these medications    albuterol 108 (90 Base) MCG/ACT inhaler Commonly known as: VENTOLIN HFA INHALE 1 TO 2 PUFFS INTO THE LUNGS EVERY 6 HOURS AS NEEDED   ALPRAZolam 0.5 MG tablet Commonly known as: XANAX Take 0.5 mg by  mouth daily as needed.   atorvastatin 10 MG tablet Commonly known as: LIPITOR TAKE 1 TABLET(10 MG) BY MOUTH DAILY   CENTRUM SILVER PO Take by mouth.   cyclobenzaprine 5 MG tablet Commonly known as: FLEXERIL TAKE 1 TABLET BY MOUTH AT NIGHT FOR 1 WEEK THEN TAKE 1 TABLET BY MOUTH TWICE DAILY   DULoxetine 60 MG capsule Commonly known as: CYMBALTA Take 120 mg by mouth daily.   fluticasone 50 MCG/ACT nasal spray Commonly known as: FLONASE SHAKE LIQUID AND USE 1 SPRAY IN EACH NOSTRIL DAILY   gabapentin 600 MG tablet Commonly known as: NEURONTIN TAKE 1 TABLET(600 MG) BY MOUTH THREE TIMES DAILY   hydroxychloroquine 200 MG tablet Commonly known as: PLAQUENIL Take 200 mg by mouth 2 (two) times daily.   levothyroxine 50 MCG tablet Commonly known as: SYNTHROID Take 1 tablet (50 mcg total) by mouth daily before breakfast.   meloxicam 15 MG tablet Commonly known as: MOBIC Take 15 mg by mouth.   nitroGLYCERIN 0.4 MG SL tablet Commonly known as: NITROSTAT Place under the tongue.   oxyCODONE-acetaminophen 10-325 MG tablet Commonly known as: PERCOCET Take 1 tablet by mouth 2 (two) times daily.   pilocarpine 5 MG tablet Commonly known as: SALAGEN Take 5 mg by mouth 3 (three) times daily.   Vitamin D3 75 MCG (3000 UT) Tabs Take 5,000 Int'l Units by mouth daily.          REVIEW OF SYSTEMS: A comprehensive ROS was conducted with the patient and is negative except as per HPI     OBJECTIVE:  VS: BP 122/70 (BP Location: Left Arm, Patient Position: Sitting, Cuff Size: Large)   Pulse 90   Ht '5\' 5"'$  (1.651 m)   Wt 295 lb (133.8 kg)   SpO2 99%   BMI 49.09 kg/m    Wt Readings from Last 3 Encounters:  08/06/22 295 lb (133.8 kg)  05/11/22 291 lb (132 kg)  04/28/22 291 lb (132 kg)     EXAM: General: Pt appears well and is in NAD  Neck: General: Supple without adenopathy. Thyroid: Thyroid size normal.  No goiter or nodules appreciated.   Lungs: Clear with good BS bilat  with no rales, rhonchi, or wheezes  Heart: Auscultation: RRR.  Extremities:  BL LE: No pretibial edema normal ROM and strength.  Mental Status: Judgment, insight: Intact Orientation: Oriented to time, place, and person Mood and affect: No depression, anxiety, or agitation     DATA REVIEWED:   Latest Reference Range & Units 08/06/22 10:28  Sodium 135 - 145 mEq/L 139  Potassium 3.5 - 5.1 mEq/L 3.8  Chloride 96 - 112 mEq/L 104  CO2 19 - 32 mEq/L 30  Glucose 70 - 99 mg/dL 131 (H)  BUN 6 - 23 mg/dL 12  Creatinine 0.40 - 1.20 mg/dL 0.68  Calcium  8.4 - 10.5 mg/dL 9.1  GFR >60.00 mL/min 96.38     Latest Reference Range & Units 08/06/22 10:28  TSH 0.35 - 5.50 uIU/mL 3.48  T4,Free(Direct) 0.60 - 1.60 ng/dL 0.94       MRI 07/02/2021  FINDINGS: Brain:   Cerebral volume is normal.   Subtle ill-defined T2 FLAIR hyperintense signal abnormality anterior right frontal lobe white matter (series 6, image 17).   6 mm round hypoenhancing focus within the left aspect of the pituitary gland, highly suspicious for a pituitary microadenoma (series 14, image 24).   No cerebellopontine angle or internal auditory canal mass is demonstrated. Unremarkable appearance of the seventh and eighth cranial nerves bilaterally.   There is no acute infarct.   No chronic intracranial blood products.   No extra-axial fluid collection.   No midline shift.   No pathologic intracranial enhancement identified.   Vascular: Maintained flow voids within the proximal large arterial vessels.   Skull and upper cervical spine: No focal suspicious marrow lesion.   Sinuses/Orbits: Visualized orbits show no acute finding. Trace mucosal thickening within the bilateral ethmoid sinuses.   Other: Minimal fluid in the right mastoid air cells.   Impression #2 will be called to the ordering clinician or representative by the Radiologist Assistant, and communication documented in the PACS or [employer].   IMPRESSION: No cerebellopontine angle or internal auditory canal mass.   6 mm round hypoenhancing focus within the left aspect of the pituitary gland, highly suspicious for a pituitary microadenoma. Correlate with relevant endocrinologic laboratory values. Additionally, a contrast-enhanced pituitary protocol brain MRI may be considered for further evaluation.   Subtle ill-defined T2 FLAIR hyperintense signal abnormality within the anterior right frontal lobe white matter, nonspecific but possibly reflecting minimal changes of chronic small vessel ischemia.   Trace fluid within the right mastoid air cells.    ASSESSMENT/PLAN/RECOMMENDATIONS:   Pituitary microadenoma:  -No local symptoms -The patient does have clinical features of Cushing syndrome, she has been screened in the past with normal 24-hour urinary cortisol -The patient does receive glucocorticoid injection which could be the reason for phenotypical features -I did not test her ACTH nor cortisol because she did receive oral corticoid injections which will alter her results -Pituitary MRI has been ordered -No other clinical concerns about hypo or hypopituitarism   2. Hypothyroidism    - Pt educated extensively on the correct way to take levothyroxine (first thing in the morning with water, 30 minutes before eating or taking other medications). - Pt encouraged to double dose the following day if she were to miss a dose given long half-life of levothyroxine. -TSH normal  Medication Continue levothyroxine 50 MCG daily     Follow-up in 1 yr    Signed electronically by: Mack Guise, MD  [facility]  [address], [place] [address] Phone: [phone] FAX: [phone]   CC: Lorrene Reid, PA-C [place] [address] Phone: [phone] Fax: [phone]   Return to Endocrinology clinic as  below: Future Appointments  Date Time Provider [place]  08/06/2022 10:30 AM Syler Norcia, Melanie Crazier, MD [facility] None  08/31/2022  2:10 PM Ronnell Freshwater, NP [facility] None

## 2022-08-07 LAB — PROLACTIN: Prolactin: 8.9 ng/mL

## 2022-08-16 DIAGNOSIS — J310 Chronic rhinitis: Secondary | ICD-10-CM | POA: Diagnosis not present

## 2022-08-16 DIAGNOSIS — J342 Deviated nasal septum: Secondary | ICD-10-CM | POA: Diagnosis not present

## 2022-08-16 DIAGNOSIS — J343 Hypertrophy of nasal turbinates: Secondary | ICD-10-CM | POA: Diagnosis not present

## 2022-08-17 DIAGNOSIS — M797 Fibromyalgia: Secondary | ICD-10-CM | POA: Diagnosis not present

## 2022-08-17 DIAGNOSIS — R2689 Other abnormalities of gait and mobility: Secondary | ICD-10-CM | POA: Diagnosis not present

## 2022-08-18 ENCOUNTER — Other Ambulatory Visit: Payer: Self-pay | Admitting: Internal Medicine

## 2022-08-18 ENCOUNTER — Other Ambulatory Visit: Payer: Self-pay | Admitting: Neurosurgery

## 2022-08-18 DIAGNOSIS — D352 Benign neoplasm of pituitary gland: Secondary | ICD-10-CM

## 2022-08-23 DIAGNOSIS — M961 Postlaminectomy syndrome, not elsewhere classified: Secondary | ICD-10-CM | POA: Diagnosis not present

## 2022-08-23 DIAGNOSIS — M533 Sacrococcygeal disorders, not elsewhere classified: Secondary | ICD-10-CM | POA: Diagnosis not present

## 2022-08-23 DIAGNOSIS — M4802 Spinal stenosis, cervical region: Secondary | ICD-10-CM | POA: Diagnosis not present

## 2022-08-23 DIAGNOSIS — R200 Anesthesia of skin: Secondary | ICD-10-CM | POA: Diagnosis not present

## 2022-08-23 DIAGNOSIS — M5030 Other cervical disc degeneration, unspecified cervical region: Secondary | ICD-10-CM | POA: Diagnosis not present

## 2022-08-23 DIAGNOSIS — M47817 Spondylosis without myelopathy or radiculopathy, lumbosacral region: Secondary | ICD-10-CM | POA: Diagnosis not present

## 2022-08-23 DIAGNOSIS — M47812 Spondylosis without myelopathy or radiculopathy, cervical region: Secondary | ICD-10-CM | POA: Diagnosis not present

## 2022-08-23 DIAGNOSIS — M25569 Pain in unspecified knee: Secondary | ICD-10-CM | POA: Diagnosis not present

## 2022-08-23 DIAGNOSIS — G8929 Other chronic pain: Secondary | ICD-10-CM | POA: Diagnosis not present

## 2022-08-23 DIAGNOSIS — M25561 Pain in right knee: Secondary | ICD-10-CM | POA: Diagnosis not present

## 2022-08-23 DIAGNOSIS — Z79891 Long term (current) use of opiate analgesic: Secondary | ICD-10-CM | POA: Diagnosis not present

## 2022-08-23 DIAGNOSIS — M7918 Myalgia, other site: Secondary | ICD-10-CM | POA: Diagnosis not present

## 2022-08-23 DIAGNOSIS — M25552 Pain in left hip: Secondary | ICD-10-CM | POA: Diagnosis not present

## 2022-08-23 DIAGNOSIS — M461 Sacroiliitis, not elsewhere classified: Secondary | ICD-10-CM | POA: Diagnosis not present

## 2022-08-23 DIAGNOSIS — G894 Chronic pain syndrome: Secondary | ICD-10-CM | POA: Diagnosis not present

## 2022-08-23 DIAGNOSIS — M5136 Other intervertebral disc degeneration, lumbar region: Secondary | ICD-10-CM | POA: Diagnosis not present

## 2022-08-23 DIAGNOSIS — M170 Bilateral primary osteoarthritis of knee: Secondary | ICD-10-CM | POA: Diagnosis not present

## 2022-08-23 DIAGNOSIS — M47816 Spondylosis without myelopathy or radiculopathy, lumbar region: Secondary | ICD-10-CM | POA: Diagnosis not present

## 2022-08-23 DIAGNOSIS — M25562 Pain in left knee: Secondary | ICD-10-CM | POA: Diagnosis not present

## 2022-08-23 DIAGNOSIS — R202 Paresthesia of skin: Secondary | ICD-10-CM | POA: Diagnosis not present

## 2022-08-23 DIAGNOSIS — M545 Low back pain, unspecified: Secondary | ICD-10-CM | POA: Diagnosis not present

## 2022-08-23 DIAGNOSIS — M25551 Pain in right hip: Secondary | ICD-10-CM | POA: Diagnosis not present

## 2022-08-30 ENCOUNTER — Ambulatory Visit: Payer: Federal, State, Local not specified - PPO | Admitting: Physician Assistant

## 2022-08-31 ENCOUNTER — Encounter: Payer: Self-pay | Admitting: Nurse Practitioner

## 2022-08-31 ENCOUNTER — Ambulatory Visit (INDEPENDENT_AMBULATORY_CARE_PROVIDER_SITE_OTHER): Payer: Federal, State, Local not specified - PPO | Admitting: Nurse Practitioner

## 2022-08-31 VITALS — BP 154/80 | HR 67 | Resp 18 | Ht 65.0 in | Wt 292.0 lb

## 2022-08-31 DIAGNOSIS — R319 Hematuria, unspecified: Secondary | ICD-10-CM

## 2022-08-31 DIAGNOSIS — R300 Dysuria: Secondary | ICD-10-CM

## 2022-08-31 DIAGNOSIS — N390 Urinary tract infection, site not specified: Secondary | ICD-10-CM | POA: Diagnosis not present

## 2022-08-31 DIAGNOSIS — R350 Frequency of micturition: Secondary | ICD-10-CM | POA: Diagnosis not present

## 2022-08-31 DIAGNOSIS — I10 Essential (primary) hypertension: Secondary | ICD-10-CM | POA: Diagnosis not present

## 2022-08-31 LAB — POCT URINALYSIS DIPSTICK
Bilirubin, UA: NEGATIVE
Glucose, UA: NEGATIVE
Ketones, UA: NEGATIVE
Nitrite, UA: NEGATIVE
Protein, UA: POSITIVE — AB
Spec Grav, UA: >=1.03 — AB (ref 1.010–1.025)
Urobilinogen, UA: 0.2 E.U./dL
pH, UA: 5.5 (ref 5.0–8.0)

## 2022-08-31 MED ORDER — NITROFURANTOIN MONOHYD MACRO 100 MG PO CAPS: 100.0000 mg | ORAL_CAPSULE | Freq: Two times a day (BID) | ORAL | 0 refills | Status: DC

## 2022-08-31 NOTE — Progress Notes (Signed)
Established patient visit   Patient: Pamela Esparza   DOB: 03-11-1966   56 y.o. Female  MRN: [id number] Visit Date: 08/31/2022   Chief Complaint  Patient presents with   Follow-up   Hyperlipidemia   Hypertension        Fibromyalgia   Subjective    HPI HPI     Hypertension    Additional comments:        Last edited by Gemma Payor, CMA on 08/31/2022  2:25 PM.      Follow up  -hypertension  --elevated blood pressure  --increased personal stress  --father and aunt are both ill - terminal illnesses -increased pain - states that cold weather does make pain worse  -recently saw ENT due to recurrent sinus and ear infections  -ENT believes pain is related to nerves.  --taking gabapentin which does help   -states that she has one issue -pressure and painful urination  -has been going on for at least a month.    Medications: Outpatient Medications Prior to Visit  Medication Sig   albuterol (VENTOLIN HFA) 108 (90 Base) MCG/ACT inhaler INHALE 1 TO 2 PUFFS INTO THE LUNGS EVERY 6 HOURS AS NEEDED   ALPRAZolam (XANAX) 0.5 MG tablet Take 0.5 mg by mouth daily as needed.   atorvastatin (LIPITOR) 10 MG tablet TAKE 1 TABLET(10 MG) BY MOUTH DAILY   Cholecalciferol (VITAMIN D3) 75 MCG (3000 UT) TABS Take 5,000 Int'l Units by mouth daily.   cyclobenzaprine (FLEXERIL) 5 MG tablet TAKE 1 TABLET BY MOUTH AT NIGHT FOR 1 WEEK THEN TAKE 1 TABLET BY MOUTH TWICE DAILY   DULoxetine (CYMBALTA) 60 MG capsule Take 120 mg by mouth daily.   fluticasone (FLONASE) 50 MCG/ACT nasal spray SHAKE LIQUID AND USE 1 SPRAY IN EACH NOSTRIL DAILY   gabapentin (NEURONTIN) 300 MG capsule Take 300 mg by mouth 2 (two) times daily.   gabapentin (NEURONTIN) 600 MG tablet TAKE 1 TABLET(600 MG) BY MOUTH THREE TIMES DAILY   hydroxychloroquine (PLAQUENIL) 200 MG tablet Take 200 mg by mouth 2 (two) times daily.   levothyroxine (SYNTHROID) 50 MCG tablet Take 1 tablet (50 mcg total) by mouth daily before breakfast.    meloxicam (MOBIC) 15 MG tablet Take 15 mg by mouth.   Multiple Vitamins-Minerals (CENTRUM SILVER PO) Take by mouth.   nitroGLYCERIN (NITROSTAT) 0.4 MG SL tablet Place under the tongue.   oxyCODONE-acetaminophen (PERCOCET) 10-325 MG tablet Take 1 tablet by mouth 2 (two) times daily.   pilocarpine (SALAGEN) 5 MG tablet Take 5 mg by mouth 3 (three) times daily.   No facility-administered medications prior to visit.    Review of Systems  Constitutional:  Negative for activity change, appetite change, chills, fatigue and fever.  HENT:  Negative for congestion, postnasal drip, rhinorrhea, sinus pressure, sinus pain, sneezing and sore throat.   Eyes: Negative.   Respiratory:  Negative for cough, chest tightness, shortness of breath and wheezing.   Cardiovascular:  Negative for chest pain and palpitations.  Gastrointestinal:  Negative for abdominal pain, constipation, diarrhea, nausea and vomiting.  Endocrine: Negative for cold intolerance, heat intolerance, polydipsia and polyuria.  Genitourinary:  Positive for dysuria, frequency and urgency. Negative for dyspareunia and flank pain.  Musculoskeletal:  Negative for arthralgias, back pain and myalgias.  Skin:  Negative for rash.  Allergic/Immunologic: Negative for environmental allergies.  Neurological:  Negative for dizziness, weakness and headaches.  Hematological:  Negative for adenopathy.  Psychiatric/Behavioral:  The patient is not nervous/anxious.  Last CBC Lab Results  Component Value Date   WBC 8.9 04/21/2022   HGB 14.4 04/21/2022   HCT 43.2 04/21/2022   MCV 93 04/21/2022   MCH 31.0 04/21/2022   RDW 12.6 04/21/2022   PLT 326 58/59/2924   Last metabolic panel Lab Results  Component Value Date   GLUCOSE 131 (H) 08/06/2022   NA 139 08/06/2022   K 3.8 08/06/2022   CL 104 08/06/2022   CO2 30 08/06/2022   BUN 12 08/06/2022   CREATININE 0.68 08/06/2022   EGFR 102 04/21/2022   CALCIUM 9.1 08/06/2022   PROT 7.5 04/21/2022    ALBUMIN 4.6 04/21/2022   LABGLOB 2.9 04/21/2022   AGRATIO 1.6 04/21/2022   BILITOT 0.4 04/21/2022   ALKPHOS 112 04/21/2022   AST 16 04/21/2022   ALT 17 04/21/2022   Last lipids Lab Results  Component Value Date   CHOL 182 04/21/2022   HDL 93 04/21/2022   LDLCALC 73 04/21/2022   LDLDIRECT 146 (H) 08/14/2019   TRIG 92 04/21/2022   CHOLHDL 2.0 04/21/2022   Last hemoglobin A1c Lab Results  Component Value Date   HGBA1C 5.6 04/21/2022   Last thyroid functions Lab Results  Component Value Date   TSH 3.48 08/06/2022   T3TOTAL 115 10/14/2020   Last vitamin D Lab Results  Component Value Date   VD25OH 50.25 09/14/2021       Objective     Today's Vitals   08/31/22 1425 08/31/22 1426  BP: (Abnormal) 155/78 (Abnormal) 154/80  Pulse: 67 67  Resp: 18   SpO2: 99%   Weight: 292 lb (132.5 kg)   Height: _0  (1.651 m)   PainSc: 6    PainLoc: Back    Body mass index is 48.59 kg/m.  BP Readings from Last 3 Encounters:  08/31/22 (Abnormal) 154/80  08/06/22 122/70  05/11/22 124/83    Wt Readings from Last 3 Encounters:  08/31/22 292 lb (132.5 kg)  08/06/22 295 lb (133.8 kg)  05/11/22 291 lb (132 kg)    Physical Exam Vitals and nursing note reviewed.  Constitutional:      Appearance: Normal appearance. She is well-developed.  HENT:     Head: Normocephalic and atraumatic.  Eyes:     Pupils: Pupils are equal, round, and reactive to light.  Cardiovascular:     Rate and Rhythm: Normal rate and regular rhythm.     Pulses: Normal pulses.     Heart sounds: Normal heart sounds.  Pulmonary:     Effort: Pulmonary effort is normal.     Breath sounds: Normal breath sounds.  Abdominal:     General: Bowel sounds are normal. There is no distension.     Palpations: Abdomen is soft. There is no mass.     Tenderness: There is abdominal tenderness. There is no right CVA tenderness, left CVA tenderness, guarding or rebound.     Hernia: No hernia is present.  Genitourinary:     Comments: Urine sample positive for small WBC, trace blood, and small protein.  Musculoskeletal:        General: Normal range of motion.     Cervical back: Normal range of motion and neck supple.  Lymphadenopathy:     Cervical: No cervical adenopathy.  Skin:    General: Skin is warm and dry.     Capillary Refill: Capillary refill takes less than 2 seconds.  Neurological:     General: No focal deficit present.     Mental Status: She is  alert and oriented to person, place, and time.  Psychiatric:        Mood and Affect: Mood normal.        Behavior: Behavior normal.        Thought Content: Thought content normal.        Judgment: Judgment normal.     Results for orders placed or performed in visit on 08/31/22  Urine Culture   Specimen: Urine   Urine  Result Value Ref Range   Urine Culture, Routine Final report    Organism ID, Bacteria Comment   POCT Urinalysis Dipstick  Result Value Ref Range   Color, UA Dark Yellow    Clarity, UA Clear    Glucose, UA Negative Negative   Bilirubin, UA Negative    Ketones, UA Negative    Spec Grav, UA >=1.030 (A) 1.010 - 1.025   Blood, UA Trace-intact    pH, UA 5.5 5.0 - 8.0   Protein, UA Positive (A) Negative   Urobilinogen, UA 0.2 0.2 or 1.0 E.U./dL   Nitrite, UA Negative    Leukocytes, UA Small (1+) (A) Negative   Appearance     Odor Unremarkable     Assessment & Plan    1. Urinary tract infection with hematuria, site unspecified Start macrobid 100 mg twice daily for 7 days. Send urine for culture and sensitivity and adjust antibiotics as indicated.  - Urine Culture; Future - Urine Culture - nitrofurantoin, macrocrystal-monohydrate, (MACROBID) 100 MG capsule; Take 1 capsule (100 mg total) by mouth 2 (two) times daily.  Dispense: 14 capsule; Refill: 0  2. Urinary frequency treat as uti. Adjust antibiotics as indicated.  - POCT Urinalysis Dipstick - Urine Culture; Future - Urine Culture - nitrofurantoin,  macrocrystal-monohydrate, (MACROBID) 100 MG capsule; Take 1 capsule (100 mg total) by mouth 2 (two) times daily.  Dispense: 14 capsule; Refill: 0  3. Essential (primary) hypertension Stable. Continue to monitor.     Problem List Items Addressed This Visit       Cardiovascular and Mediastinum   Essential (primary) hypertension     Genitourinary   Urinary tract infection with hematuria - Primary   Relevant Medications   nitrofurantoin, macrocrystal-monohydrate, (MACROBID) 100 MG capsule   Other Relevant Orders   Urine Culture (Completed)     Other   Urinary frequency   Relevant Medications   nitrofurantoin, macrocrystal-monohydrate, (MACROBID) 100 MG capsule   Other Relevant Orders   POCT Urinalysis Dipstick (Completed)   Urine Culture (Completed)     Return in about 3 months (around 11/30/2022) for medicare wellness, FBW a week prior to visit.         Ronnell Freshwater, NP  [facility] at [facility] [phone] (phone) [phone] (fax)  [place]

## 2022-09-02 LAB — URINE CULTURE

## 2022-09-03 NOTE — Progress Notes (Signed)
No sign of a urinary tract infection.

## 2022-09-17 ENCOUNTER — Ambulatory Visit
Admission: RE | Admit: 2022-09-17 | Discharge: 2022-09-17 | Disposition: A | Discharge status: Home / Self Care | Payer: Federal, State, Local not specified - PPO | Source: Ambulatory Visit | Attending: Neurosurgery | Admitting: Neurosurgery

## 2022-09-17 DIAGNOSIS — D352 Benign neoplasm of pituitary gland: Secondary | ICD-10-CM | POA: Diagnosis not present

## 2022-09-17 MED ORDER — GADOPICLENOL 0.5 MMOL/ML IV SOLN
5.0000 mL | Freq: Once | INTRAVENOUS | Status: AC | PRN
  Administered 2022-09-17: 5 mL via INTRAVENOUS

## 2022-09-18 DIAGNOSIS — N390 Urinary tract infection, site not specified: Secondary | ICD-10-CM | POA: Insufficient documentation

## 2022-09-18 DIAGNOSIS — R350 Frequency of micturition: Secondary | ICD-10-CM | POA: Insufficient documentation

## 2022-10-11 ENCOUNTER — Encounter: Payer: Self-pay | Admitting: Gastroenterology

## 2022-10-14 DIAGNOSIS — D352 Benign neoplasm of pituitary gland: Secondary | ICD-10-CM | POA: Diagnosis not present

## 2022-10-22 ENCOUNTER — Other Ambulatory Visit: Payer: Self-pay | Admitting: Nurse Practitioner

## 2022-10-22 DIAGNOSIS — E7800 Pure hypercholesterolemia, unspecified: Secondary | ICD-10-CM

## 2022-10-22 DIAGNOSIS — Z0000 Encounter for general adult medical examination without abnormal findings: Secondary | ICD-10-CM

## 2022-10-22 DIAGNOSIS — I10 Essential (primary) hypertension: Secondary | ICD-10-CM

## 2022-11-17 ENCOUNTER — Other Ambulatory Visit: Payer: Self-pay

## 2022-11-17 DIAGNOSIS — E7800 Pure hypercholesterolemia, unspecified: Secondary | ICD-10-CM

## 2022-11-17 MED ORDER — ATORVASTATIN CALCIUM 10 MG PO TABS: ORAL_TABLET | ORAL | 0 refills | Status: DC

## 2022-11-23 ENCOUNTER — Other Ambulatory Visit: Payer: Federal, State, Local not specified - PPO

## 2022-11-30 ENCOUNTER — Encounter: Payer: Federal, State, Local not specified - PPO | Admitting: Physician Assistant

## 2022-11-30 ENCOUNTER — Encounter: Payer: Federal, State, Local not specified - PPO | Admitting: Nurse Practitioner

## 2022-12-16 ENCOUNTER — Other Ambulatory Visit: Payer: Self-pay

## 2022-12-16 DIAGNOSIS — M3500 Sicca syndrome, unspecified: Secondary | ICD-10-CM | POA: Diagnosis not present

## 2022-12-16 DIAGNOSIS — M7989 Other specified soft tissue disorders: Secondary | ICD-10-CM | POA: Diagnosis not present

## 2022-12-16 DIAGNOSIS — M1991 Primary osteoarthritis, unspecified site: Secondary | ICD-10-CM | POA: Diagnosis not present

## 2022-12-16 DIAGNOSIS — R21 Rash and other nonspecific skin eruption: Secondary | ICD-10-CM | POA: Diagnosis not present

## 2022-12-16 DIAGNOSIS — M797 Fibromyalgia: Secondary | ICD-10-CM | POA: Diagnosis not present

## 2022-12-16 DIAGNOSIS — Z6841 Body Mass Index (BMI) 40.0 and over, adult: Secondary | ICD-10-CM | POA: Diagnosis not present

## 2022-12-16 DIAGNOSIS — Z0000 Encounter for general adult medical examination without abnormal findings: Secondary | ICD-10-CM

## 2022-12-16 DIAGNOSIS — Z130 Encounter for screening for diseases of the blood and blood-forming organs and certain disorders involving the immune mechanism: Secondary | ICD-10-CM

## 2022-12-20 ENCOUNTER — Other Ambulatory Visit: Payer: Federal, State, Local not specified - PPO

## 2022-12-20 DIAGNOSIS — Z0000 Encounter for general adult medical examination without abnormal findings: Secondary | ICD-10-CM | POA: Diagnosis not present

## 2022-12-20 DIAGNOSIS — Z1329 Encounter for screening for other suspected endocrine disorder: Secondary | ICD-10-CM | POA: Diagnosis not present

## 2022-12-20 DIAGNOSIS — Z1321 Encounter for screening for nutritional disorder: Secondary | ICD-10-CM | POA: Diagnosis not present

## 2022-12-20 DIAGNOSIS — Z13228 Encounter for screening for other metabolic disorders: Secondary | ICD-10-CM | POA: Diagnosis not present

## 2022-12-20 DIAGNOSIS — Z130 Encounter for screening for diseases of the blood and blood-forming organs and certain disorders involving the immune mechanism: Secondary | ICD-10-CM

## 2022-12-21 LAB — COMPREHENSIVE METABOLIC PANEL
ALT: 22 IU/L (ref 0–32)
AST: 25 IU/L (ref 0–40)
Albumin/Globulin Ratio: 1.4 (ref 1.2–2.2)
Albumin: 4.2 g/dL (ref 3.8–4.9)
Alkaline Phosphatase: 117 IU/L (ref 44–121)
BUN/Creatinine Ratio: 17 (ref 9–23)
BUN: 13 mg/dL (ref 6–24)
Bilirubin Total: 0.5 mg/dL (ref 0.0–1.2)
CO2: 23 mmol/L (ref 20–29)
Calcium: 9.5 mg/dL (ref 8.7–10.2)
Chloride: 103 mmol/L (ref 96–106)
Creatinine, Ser: 0.78 mg/dL (ref 0.57–1.00)
Globulin, Total: 2.9 g/dL (ref 1.5–4.5)
Glucose: 104 mg/dL — ABNORMAL HIGH (ref 70–99)
Potassium: 4 mmol/L (ref 3.5–5.2)
Sodium: 143 mmol/L (ref 134–144)
Total Protein: 7.1 g/dL (ref 6.0–8.5)
eGFR: 88 mL/min/{1.73_m2} (ref >59)

## 2022-12-21 LAB — CBC
Hematocrit: 42.6 % (ref 34.0–46.6)
Hemoglobin: 14.2 g/dL (ref 11.1–15.9)
MCH: 31.5 pg (ref 26.6–33.0)
MCHC: 33.3 g/dL (ref 31.5–35.7)
MCV: 95 fL (ref 79–97)
Platelets: 286 10*3/uL (ref 150–450)
RBC: 4.51 x10E6/uL (ref 3.77–5.28)
RDW: 13 % (ref 11.7–15.4)
WBC: 6.7 10*3/uL (ref 3.4–10.8)

## 2022-12-21 LAB — HEMOGLOBIN A1C
Est. average glucose Bld gHb Est-mCnc: 114 mg/dL
Hgb A1c MFr Bld: 5.6 % (ref 4.8–5.6)

## 2022-12-21 LAB — TSH: TSH: 2.94 u[IU]/mL (ref 0.450–4.500)

## 2022-12-21 LAB — LIPID PANEL
Chol/HDL Ratio: 2.2 ratio (ref 0.0–4.4)
Cholesterol, Total: 175 mg/dL (ref 100–199)
HDL: 81 mg/dL (ref >39)
LDL Chol Calc (NIH): 78 mg/dL (ref 0–99)
Triglycerides: 89 mg/dL (ref 0–149)
VLDL Cholesterol Cal: 16 mg/dL (ref 5–40)

## 2022-12-23 DIAGNOSIS — M170 Bilateral primary osteoarthritis of knee: Secondary | ICD-10-CM | POA: Diagnosis not present

## 2022-12-23 DIAGNOSIS — M5136 Other intervertebral disc degeneration, lumbar region: Secondary | ICD-10-CM | POA: Diagnosis not present

## 2022-12-23 DIAGNOSIS — G8929 Other chronic pain: Secondary | ICD-10-CM | POA: Diagnosis not present

## 2022-12-23 DIAGNOSIS — M47816 Spondylosis without myelopathy or radiculopathy, lumbar region: Secondary | ICD-10-CM | POA: Diagnosis not present

## 2022-12-23 DIAGNOSIS — M5030 Other cervical disc degeneration, unspecified cervical region: Secondary | ICD-10-CM | POA: Diagnosis not present

## 2022-12-23 DIAGNOSIS — Z79891 Long term (current) use of opiate analgesic: Secondary | ICD-10-CM | POA: Diagnosis not present

## 2022-12-23 DIAGNOSIS — G894 Chronic pain syndrome: Secondary | ICD-10-CM | POA: Diagnosis not present

## 2022-12-23 DIAGNOSIS — M533 Sacrococcygeal disorders, not elsewhere classified: Secondary | ICD-10-CM | POA: Diagnosis not present

## 2022-12-23 DIAGNOSIS — Z9889 Other specified postprocedural states: Secondary | ICD-10-CM | POA: Diagnosis not present

## 2022-12-23 DIAGNOSIS — M961 Postlaminectomy syndrome, not elsewhere classified: Secondary | ICD-10-CM | POA: Diagnosis not present

## 2022-12-23 DIAGNOSIS — M7918 Myalgia, other site: Secondary | ICD-10-CM | POA: Diagnosis not present

## 2022-12-23 DIAGNOSIS — M62838 Other muscle spasm: Secondary | ICD-10-CM | POA: Diagnosis not present

## 2022-12-23 DIAGNOSIS — M461 Sacroiliitis, not elsewhere classified: Secondary | ICD-10-CM | POA: Diagnosis not present

## 2022-12-23 DIAGNOSIS — M545 Low back pain, unspecified: Secondary | ICD-10-CM | POA: Diagnosis not present

## 2022-12-23 DIAGNOSIS — G5720 Lesion of femoral nerve, unspecified lower limb: Secondary | ICD-10-CM | POA: Diagnosis not present

## 2022-12-27 ENCOUNTER — Encounter: Payer: Self-pay | Admitting: Nurse Practitioner

## 2022-12-27 ENCOUNTER — Ambulatory Visit (INDEPENDENT_AMBULATORY_CARE_PROVIDER_SITE_OTHER): Payer: Federal, State, Local not specified - PPO | Admitting: Nurse Practitioner

## 2022-12-27 VITALS — BP 135/78 | HR 60 | Ht 65.0 in | Wt 286.1 lb

## 2022-12-27 DIAGNOSIS — E039 Hypothyroidism, unspecified: Secondary | ICD-10-CM | POA: Diagnosis not present

## 2022-12-27 DIAGNOSIS — Z0000 Encounter for general adult medical examination without abnormal findings: Secondary | ICD-10-CM | POA: Diagnosis not present

## 2022-12-27 DIAGNOSIS — Z809 Family history of malignant neoplasm, unspecified: Secondary | ICD-10-CM

## 2022-12-27 DIAGNOSIS — I10 Essential (primary) hypertension: Secondary | ICD-10-CM

## 2022-12-27 DIAGNOSIS — K635 Polyp of colon: Secondary | ICD-10-CM

## 2022-12-27 DIAGNOSIS — Z803 Family history of malignant neoplasm of breast: Secondary | ICD-10-CM

## 2022-12-27 DIAGNOSIS — Z1211 Encounter for screening for malignant neoplasm of colon: Secondary | ICD-10-CM

## 2022-12-27 NOTE — Progress Notes (Signed)
Subjective:   Pamela Esparza is a 57 y.o. female who presents for Medicare Annual (Subsequent) preventive examination. -?need for colon cancer screening. -labs done 12/20/2022 --glucose 104 with normal  HgbA1c  -strong family history of cancer. States that her mother's doctors suggested she be screened for cancer. Has multiple family members on both sides of her family  -mother is currently being treated for lung cancer. She had breast cancer twice.  -sunt has stage 4 lung cancer -grandmother had ovarian cancer.  -tow family members with brain cancer -father has kidney cancer   Abdominal pain, lower right side  -has been going on for 4 months. Gradually worsening.  -states that she does have history of IBS and goes back and forth between constipation and diarrhea.  -having intestinal cramping when stool is moving through. States that cramping is causing sweats, nausea, and shakes.  -the patient states that she is due to have screening colonoscopy. -History of precancerous polyps.    Review of Systems    See HPI         Objective:    Today's Vitals   12/27/22 1357 12/27/22 1516  BP: (Abnormal) 141/79 135/78  Pulse: 60   SpO2: 99%   Weight: 286 lb 1.9 oz (129.8 kg)   Height: 5\' 5"  (1.651 m)    Body mass index is 47.61 kg/m.    Row Labels 12/27/2022    2:21 PM 12/05/2020    9:06 AM 11/06/2019    7:21 AM 10/30/2019    1:40 PM  Advanced Directives   Section Header. No data exists in this row.      Does Patient Have a Medical Advance Directive?   Yes Yes Yes Yes  Type of Advance Directive   Living will Healthcare Power of Time;Living will Healthcare Power of Walker;Living will   Does patient want to make changes to medical advance directive?   No - Patient declined  No - Patient declined   Copy of Healthcare Power of Attorney in Chart?    No - copy requested No - copy requested No - copy requested    Current Medications (verified) Outpatient Encounter Medications as  of 12/27/2022  Medication Sig   albuterol (VENTOLIN HFA) 108 (90 Base) MCG/ACT inhaler INHALE 1 TO 2 PUFFS INTO THE LUNGS EVERY 6 HOURS AS NEEDED   ALPRAZolam (XANAX) 0.5 MG tablet Take 0.5 mg by mouth daily as needed.   atorvastatin (LIPITOR) 10 MG tablet TAKE 1 TABLET(10 MG) BY MOUTH DAILY   Cholecalciferol (VITAMIN D3) 75 MCG (3000 UT) TABS Take 5,000 Int'l Units by mouth daily.   cyclobenzaprine (FLEXERIL) 5 MG tablet TAKE 1 TABLET BY MOUTH AT NIGHT FOR 1 WEEK THEN TAKE 1 TABLET BY MOUTH TWICE DAILY   DULoxetine (CYMBALTA) 60 MG capsule Take 120 mg by mouth daily.   fluticasone (FLONASE) 50 MCG/ACT nasal spray SHAKE LIQUID AND USE 1 SPRAY IN EACH NOSTRIL DAILY   gabapentin (NEURONTIN) 300 MG capsule Take 600 mg by mouth 2 (two) times daily.   gabapentin (NEURONTIN) 600 MG tablet TAKE 1 TABLET(600 MG) BY MOUTH THREE TIMES DAILY   hydroxychloroquine (PLAQUENIL) 200 MG tablet Take 200 mg by mouth 2 (two) times daily.   levothyroxine (SYNTHROID) 50 MCG tablet Take 1 tablet (50 mcg total) by mouth daily before breakfast.   meloxicam (MOBIC) 15 MG tablet Take 15 mg by mouth.   Multiple Vitamins-Minerals (CENTRUM SILVER PO) Take by mouth.   nitroGLYCERIN (NITROSTAT) 0.4 MG SL tablet Place under  the tongue.   oxyCODONE-acetaminophen (PERCOCET) 10-325 MG tablet Take 1 tablet by mouth 2 (two) times daily.   pilocarpine (SALAGEN) 5 MG tablet Take 5 mg by mouth 3 (three) times daily.   [DISCONTINUED] nitrofurantoin, macrocrystal-monohydrate, (MACROBID) 100 MG capsule Take 1 capsule (100 mg total) by mouth 2 (two) times daily. (Patient not taking: Reported on 12/27/2022)   No facility-administered encounter medications on file as of 12/27/2022.    Allergies (verified) Aspirin, Hydrocodone-acetaminophen, Azithromycin, Codeine, Lyrica [pregabalin], Solu-medrol [methylprednisolone], and Topiramate   History: Past Medical History:  Diagnosis Date   Anxiety    Arthritis    Asthma    Cataract    DDD  (degenerative disc disease), lumbar    Depression    Depression    Phreesia 10/13/2020   Fibromyalgia    Hyperlipidemia    Joint pain    Neuromuscular disorder (HCC)    [employer]reesia 10/13/2020   Neuropathy    Osteoporosis    Sjogren's disease (HCC)    Thyroid disease    Past Surgical History:  Procedure Laterality Date   ABDOMINAL HYSTERECTOMY     ANKLE SURGERY Right    APPENDECTOMY     CHOLECYSTECTOMY     ESOPHAGOGASTRODUODENOSCOPY (EGD) WITH PROPOFOL N/A 12/05/2020   Procedure: ESOPHAGOGASTRODUODENOSCOPY (EGD) WITH PROPOFOL;  Surgeon: Pasty Spillers, MD;  Location: [facility];  Service: Endoscopy;  Laterality: N/A;   finger sugery     NECK SURGERY     TENDON TRANSFER Right 11/06/2019   Procedure: PERONEAL RECONSTRUCTION WITH PERONEAL LONGUS RIGHT LEG;  Surgeon: Nadara Mustard, MD;  Location:  [facility];  Service: Orthopedics;  Laterality: Right;   TUBAL LIGATION N/A    Phreesia 10/13/2020   Family History  Problem Relation Age of Onset   Cancer Mother    Heart attack Father    Hypertension Father    Diabetes Father    Birth defects Maternal Grandmother        ovarian   Depression Maternal Grandmother    Birth defects Paternal Grandmother        brain   Colon cancer Neg Hx    Colon polyps Neg Hx    Esophageal cancer Neg Hx    Stomach cancer Neg Hx    Rectal cancer Neg Hx    Social History   Socioeconomic History   Marital status: Married    Spouse name: Not on file   Number of children: Not on file   Years of education: Not on file   Highest education level: Not on file  Occupational History   Not on file  Tobacco Use   Smoking status: Never   Smokeless tobacco: Never  Vaping Use   Vaping Use: Never used  Substance and Sexual Activity   Alcohol use: Yes    Comment: 4 drinks a year-rare   Drug use: Never   Sexual activity: Not Currently  Other Topics Concern   Not on file  Social History Narrative   Not on file   Social  Determinants of Health   Financial Resource Strain: Low Risk  (12/27/2022)   Overall Financial Resource Strain (CARDIA)    Difficulty of Paying Living Expenses: Not hard at all  Food Insecurity: No Food Insecurity (12/27/2022)   Hunger Vital Sign    Worried About Running Out of Food in the Last Year: Never true    Ran Out of Food in the Last Year: Never true  Transportation Needs: No Transportation Needs (12/27/2022)   PRAPARE -  Administrator, Civil Service (Medical): No    Lack of Transportation (Non-Medical): No  Physical Activity: Inactive (12/27/2022)   Exercise Vital Sign    Days of Exercise per Week: 0 days    Minutes of Exercise per Session: 0 min  Stress: Stress Concern Present (12/27/2022)   [employer] of Occupational Health - Occupational Stress Questionnaire    Feeling of Stress : Very much  Social Connections: Moderately Isolated (12/27/2022)   Social Connection and Isolation Panel [NHANES]    Frequency of Communication with Friends and Family: More than three times a week    Frequency of Social Gatherings with Friends and Family: Three times a week    Attends Religious Services: Never    Active Member of Clubs or Organizations: No    Attends Engineer, structural: Never    Marital Status: Married    Tobacco Counseling Counseling given: Not Answered   Clinical Intake:  Pre-visit preparation completed: Yes  Pain : No/denies pain     BMI - recorded: 47.61 Nutritional Status: BMI > 30  Obese Nutritional Risks: None Diabetes: No  How often do you need to have someone help you when you read instructions, pamphlets, or other written materials from your doctor or pharmacy?: 1 - Never  Diabetic?no  Interpreter Needed?: No      Activities of Daily Living   Row Labels 12/27/2022    2:15 PM 08/31/2022    2:29 PM  In your present state of health, do you have any difficulty performing the following activities:   Section Header. No data exists in  this row.    Hearing?   1 1  Vision?   1 1  Difficulty concentrating or making decisions?   0 1  Walking or climbing stairs?   1 1  Dressing or bathing?   1 1  Comment    "sometimes"  Doing errands, shopping?   1 1  Comment    "sometimes"  Preparing Food and eating ?   Y   Using the Toilet?   N   In the past six months, have you accidently leaked urine?   N   Do you have problems with loss of bowel control?   N   Managing your Medications?   N   Managing your Finances?   N   Housekeeping or managing your Housekeeping?   Y     Patient Care Team: Carlean Jews, NP as PCP - General (Family Medicine) Meriam Sprague, MD as PCP - Cardiology (Cardiology)  Indicate any recent Medical Services you may have received from other than [facility] providers in the past year (date may be approximate).     Assessment:   Encounter for Medicare annual wellness exam  Essential (primary) hypertension Assessment & Plan: Stable. Continue current medication.    Acquired hypothyroidism Assessment & Plan: Stable. Continue current medication.    Polyp of colon, unspecified part of colon, unspecified type Assessment & Plan: Refer to GI for screening colonoscopy    Screening for colon cancer -     Ambulatory referral to Gastroenterology  Family history of breast cancer Assessment & Plan: Refer to oncology for genetic screening   Orders: -     Ambulatory referral to Hematology / Oncology  Family history of cancer in mother -     Ambulatory referral to Hematology / Oncology  Family history of cancer in father -     Ambulatory referral to Hematology / Oncology  Hearing/Vision screen No results found.  Depression Screen   Row Labels 08/31/2022    2:28 PM 05/11/2022    1:09 PM 04/28/2022    8:51 AM 04/14/2022    2:18 PM 12/28/2021    1:17 PM 10/29/2021    9:54 AM 10/16/2021    9:27 AM  PHQ 2/9 Scores   Section Header. No data exists in this row.         PHQ - 2 Score   2 1 1 1 1   0 0  PHQ- 9 Score   7 7 7 7 10 5 1     Fall Risk   Row Labels 08/31/2022    2:29 PM 05/11/2022    1:09 PM 04/28/2022    8:51 AM 04/14/2022    2:18 PM 12/28/2021    1:16 PM  Fall Risk    Section Header. No data exists in this row.       Falls in the past year?   1 1 1 1 1   Number falls in past yr:   1 1 1 1 1   Comment   5      Injury with Fall?   0 1 1 1  0  Risk for fall due to :    History of fall(s);Impaired balance/gait Impaired balance/gait;Impaired mobility Impaired balance/gait;Impaired mobility;History of fall(s) History of fall(s)  Follow up    Falls evaluation completed Falls evaluation completed Falls evaluation completed Falls evaluation completed    FALL RISK PREVENTION PERTAINING TO THE HOME:  Any stairs in or around the home? No YES If so, are there any without handrails? No  Home free of loose throw rugs in walkways, pet beds, electrical cords, etc? Yes  Adequate lighting in your home to reduce risk of falls? Yes   ASSISTIVE DEVICES UTILIZED TO PREVENT FALLS:  Life alert? No  Use of a cane, walker or w/c? Yes  Grab bars in the bathroom? Yes  Shower chair or bench in shower? Yes  Elevated toilet seat or a handicapped toilet? No   TIMED UP AND GO:  Was the test performed? Yes .  Length of time to ambulate 10 feet: 10 sec.   Gait steady and fast without use of assistive device  Cognitive Function:       Row Labels 12/27/2022    2:12 PM  6CIT Screen   Section Header. No data exists in this row.   What Year?   0 points  What month?   0 points  What time?   0 points  Count back from 20   0 points  Months in reverse   0 points  Repeat phrase   0 points  Total Score   0 points    Immunizations Immunization History  Administered Date(s) Administered   Hep A / Hep B 02/10/2021, 03/13/2021   Moderna Sars-Covid-2 Vaccination 03/05/2020, 04/02/2020   Tdap 03/21/2019   Zoster Recombinat (Shingrix) 10/14/2020, 04/16/2021    TDAP status: Up to date  Flu  Vaccine status: Declined, Education has been provided regarding the importance of this vaccine but patient still declined. Advised may receive this vaccine at local pharmacy or Health Dept. Aware to provide a copy of the vaccination record if obtained from local pharmacy or Health Dept. Verbalized acceptance and understanding.  Pneumococcal vaccine status: Due, Education has been provided regarding the importance of this vaccine. Advised may receive this vaccine at local pharmacy or Health Dept. Aware to provide a copy of the vaccination record if  obtained from local pharmacy or Health Dept. Verbalized acceptance and understanding.  Covid-19 vaccine status: Completed vaccines  Qualifies for Shingles Vaccine? Yes   Zostavax completed Yes   Shingrix Completed?: Yes  Screening Tests Health Maintenance  Topic Date Due   COVID-19 Vaccine (3 - Moderna risk series) 04/30/2020   COLONOSCOPY (Pts 45-23yrs Insurance coverage will need to be confirmed)  09/02/2022   INFLUENZA VACCINE  04/28/2023   Medicare Annual Wellness (AWV)  12/27/2023   MAMMOGRAM  08/05/2024   DTaP/Tdap/Td (2 - Td or Tdap) 03/20/2029   Hepatitis C Screening  Completed   HIV Screening  Completed   Zoster Vaccines- Shingrix  Completed   HPV VACCINES  Aged Out   PAP SMEAR-Modifier  Discontinued    Health Maintenance  Health Maintenance Due  Topic Date Due   COVID-19 Vaccine (3 - Moderna risk series) 04/30/2020   COLONOSCOPY (Pts 45-28yrs Insurance coverage will need to be confirmed)  09/02/2022    Colorectal cancer screening: Referral to GI placed 12/27/2022. Pt aware the office will call re: appt.  Mammogram status: Completed 2023. Repeat every year  Bone density is not recommended yet.   Lung Cancer Screening: (Low Dose CT Chest recommended if Age 58-80 years, 30 pack-year currently smoking OR have quit w/in 15years.) does not qualify.   Lung Cancer Screening Referral: N/A  Additional Screening:  Hepatitis C  Screening: does qualify; Completed 2022  Vision Screening: Recommended annual ophthalmology exams for early detection of glaucoma and other disorders of the eye. Is the patient up to date with their annual eye exam?  No  Who is the provider or what is the name of the office in which the patient attends annual eye exams? PT CURRENTLY LOOKING FOR ONE If pt is not established with a provider, would they like to be referred to a provider to establish care? No .   Dental Screening: Recommended annual dental exams for proper oral hygiene  Community Resource Referral / Chronic Care Management: CRR required this visit?  No   CCM required this visit?  No      Plan:     I have personally reviewed and noted the following in the patient's chart:   Medical and social history Use of alcohol, tobacco or illicit drugs  Current medications and supplements including opioid prescriptions. Patient is not currently taking opioid prescriptions. Functional ability and status Nutritional status Physical activity Advanced directives List of other physicians Hospitalizations, surgeries, and ER visits in previous 12 months Vitals Screenings to include cognitive, depression, and falls Referrals and appointments  In addition, I have reviewed and discussed with patient certain preventive protocols, quality metrics, and best practice recommendations. A written personalized care plan for preventive services as well as general preventive health recommendations were provided to patient.    Carlean Jews, NP   01/27/2023   Nurse Notes: FACE TO FACE 30 MIN

## 2023-01-10 ENCOUNTER — Telehealth: Payer: Self-pay | Admitting: Internal Medicine

## 2023-01-10 NOTE — Telephone Encounter (Signed)
Hi Dr. Leonides Schanz,   Supervising provider PM 01/10/2023  Patient called regarding a referral for a colonoscopy. She has been seeing [facility]. States that she would like to come back to our office since we did her colonoscopy before. [facility] records are in [facility] for you to review and advise on scheduling.    Thank you.

## 2023-01-11 ENCOUNTER — Encounter: Payer: Self-pay | Admitting: Internal Medicine

## 2023-01-25 ENCOUNTER — Ambulatory Visit: Payer: Federal, State, Local not specified - PPO | Admitting: *Deleted

## 2023-01-25 VITALS — Ht 65.0 in | Wt 280.0 lb

## 2023-01-25 DIAGNOSIS — Z86010 Personal history of colonic polyps: Secondary | ICD-10-CM

## 2023-01-25 MED ORDER — NA SULFATE-K SULFATE-MG SULF 17.5-3.13-1.6 GM/177ML PO SOLN
1.0000 | Freq: Once | ORAL | 0 refills | Status: AC
Start: 2023-01-25 — End: 2023-01-25

## 2023-01-25 NOTE — Progress Notes (Signed)
Pt's name and DOB verified at the beginning of the pre-visit.  Pt states she has difficulty with  walking  (uses cane) standing  or sitting for too long  and can not lay on her hips for too long R  hip worse than L hip . Pt states she cacn lay down with no issue  and move side to side. Gave both [facility] main # and MD on call # prior to instructions.  No egg or soy allergy known to patient  No issues known to pt with past sedation with any surgeries or procedures Pt has no issues moving head neck or  issues with swallowing large amounts and pills  No FH of Malignant Hyperthermia Pt is not on diet pills Pt is not on home 02  Pt is not on blood thinners  Pt has frequent issues with constipation RN instructed pt to use Miralax per bottles instructions a week before prep days. Pt states they will Pt is not on dialysis Pt denies any upcoming cardiac testing Pt encouraged to use to use Singlecare or Goodrx to reduce cost  Patient's chart reviewed by Cathlyn Parsons CNRA prior to pre-visit and patient appropriate for the [facility].  Pre-visit completed and red dot placed by patient's name on their procedure day (on provider's schedule).  . Visit by phone Pt states weight is 280 lb Instructed pt why it is important to and  to call if they have any changes in health or new medications. Directed them to the # given and on instructions.   Pt states they will.  Instructions reviewed with pt and pt states understanding. Instructed to review again prior to procedure. Pt states they will.  Instructions sent by mail with coupon and by my chart

## 2023-01-27 DIAGNOSIS — Z803 Family history of malignant neoplasm of breast: Secondary | ICD-10-CM | POA: Insufficient documentation

## 2023-01-27 DIAGNOSIS — K635 Polyp of colon: Secondary | ICD-10-CM | POA: Insufficient documentation

## 2023-01-27 NOTE — Assessment & Plan Note (Signed)
Stable.  Continue current medication

## 2023-01-27 NOTE — Assessment & Plan Note (Signed)
Refer to oncology for genetic screening

## 2023-01-27 NOTE — Assessment & Plan Note (Signed)
Refer to GI for screening colonoscopy 

## 2023-02-09 ENCOUNTER — Ambulatory Visit: Payer: Federal, State, Local not specified - PPO | Admitting: Nurse Practitioner

## 2023-02-11 DIAGNOSIS — R413 Other amnesia: Secondary | ICD-10-CM | POA: Diagnosis not present

## 2023-02-11 DIAGNOSIS — M797 Fibromyalgia: Secondary | ICD-10-CM | POA: Diagnosis not present

## 2023-02-15 ENCOUNTER — Encounter: Payer: Self-pay | Admitting: Genetic Counselor

## 2023-02-15 ENCOUNTER — Inpatient Hospital Stay: Payer: Federal, State, Local not specified - PPO

## 2023-02-15 ENCOUNTER — Inpatient Hospital Stay: Payer: Federal, State, Local not specified - PPO | Attending: Genetic Counselor | Admitting: Genetic Counselor

## 2023-02-15 ENCOUNTER — Other Ambulatory Visit: Payer: Self-pay | Admitting: Genetic Counselor

## 2023-02-15 ENCOUNTER — Other Ambulatory Visit: Payer: Self-pay

## 2023-02-15 DIAGNOSIS — Z8049 Family history of malignant neoplasm of other genital organs: Secondary | ICD-10-CM | POA: Insufficient documentation

## 2023-02-15 DIAGNOSIS — Z8052 Family history of malignant neoplasm of bladder: Secondary | ICD-10-CM | POA: Diagnosis not present

## 2023-02-15 DIAGNOSIS — Z808 Family history of malignant neoplasm of other organs or systems: Secondary | ICD-10-CM | POA: Insufficient documentation

## 2023-02-15 DIAGNOSIS — Z803 Family history of malignant neoplasm of breast: Secondary | ICD-10-CM

## 2023-02-15 HISTORY — DX: Family history of malignant neoplasm of other organs or systems: Z80.8

## 2023-02-15 LAB — GENETIC SCREENING ORDER

## 2023-02-15 NOTE — Progress Notes (Addendum)
REFERRING PROVIDER: Carlean Jews, NP [address] [place],  [place] [address]  PRIMARY PROVIDER:  Carlean Jews, NP  PRIMARY REASON FOR VISIT:  1. Family history of brain cancer   2. Family history of breast cancer   3. Family history of uterine cancer   4. Family history of bladder cancer      HISTORY OF PRESENT ILLNESS:   Pamela Esparza, a 57 y.o. female, was seen for a [facility] cancer genetics consultation at the request of Pamela Esparza due to a family history of cancer.  Pamela Esparza presents to clinic today to discuss the possibility of a hereditary predisposition to cancer, genetic testing, and to further clarify her future cancer risks, as well as potential cancer risks for family members.   Pamela Esparza is a 57 y.o. female with no personal history of cancer.    CANCER HISTORY:  Oncology History   No history exists.     RISK FACTORS:  Menarche was at age 43-12.  First live birth at age 12.  OCP use for approximately 5 years.  Ovaries intact: no.  Hysterectomy: yes.  Menopausal status: postmenopausal.  HRT use: 0 years. Colonoscopy: yes; normal. Mammogram within the last year: yes. Number of breast biopsies: 0. Up to date with pelvic exams: n/a. Any excessive radiation exposure in the past: no  Past Medical History:  Diagnosis Date   Anxiety    Arthritis    Asthma    Cataract    DDD (degenerative disc disease), lumbar    Depression    Depression    Phreesia 10/13/2020   Family history of bladder cancer    Family history of brain cancer 02/15/2023   Fibromyalgia    Hyperlipidemia    Joint pain    Neuromuscular disorder (HCC)    Phreesia 10/13/2020   Neuropathy    Osteoporosis    Sjogren's disease (HCC)    Thyroid disease     Past Surgical History:  Procedure Laterality Date   ABDOMINAL HYSTERECTOMY     ANKLE SURGERY Right    APPENDECTOMY     CHOLECYSTECTOMY     ESOPHAGOGASTRODUODENOSCOPY (EGD) WITH PROPOFOL N/A 12/05/2020    Procedure: ESOPHAGOGASTRODUODENOSCOPY (EGD) WITH PROPOFOL;  Surgeon: Pamela Spillers, MD;  Location: [facility];  Service: Endoscopy;  Laterality: N/A;   finger sugery     NECK SURGERY     TENDON TRANSFER Right 11/06/2019   Procedure: PERONEAL RECONSTRUCTION WITH PERONEAL LONGUS RIGHT LEG;  Surgeon: Pamela Mustard, MD;  Location: [facility];  Service: Orthopedics;  Laterality: Right;   TUBAL LIGATION N/A    Phreesia 10/13/2020    Social History   Socioeconomic History   Marital status: Married    Spouse name: Not on file   Number of children: Not on file   Years of education: Not on file   Highest education level: Not on file  Occupational History   Not on file  Tobacco Use   Smoking status: Never   Smokeless tobacco: Never  Vaping Use   Vaping Use: Never used  Substance and Sexual Activity   Alcohol use: Yes    Comment: 4 drinks a year-rare   Drug use: Never   Sexual activity: Not Currently  Other Topics Concern   Not on file  Social History Narrative   Not on file   Social Determinants of Health   Financial Resource Strain: Low Risk  (12/27/2022)   Overall Financial Resource Strain (CARDIA)  Difficulty of Paying Living Expenses: Not hard at all  Food Insecurity: No Food Insecurity (12/27/2022)   Hunger Vital Sign    Worried About Running Out of Food in the Last Year: Never true    Ran Out of Food in the Last Year: Never true  Transportation Needs: No Transportation Needs (12/27/2022)   PRAPARE - Administrator, Civil Service (Medical): No    Lack of Transportation (Non-Medical): No  Physical Activity: Inactive (12/27/2022)   Exercise Vital Sign    Days of Exercise per Week: 0 days    Minutes of Exercise per Session: 0 min  Stress: Stress Concern Present (12/27/2022)   [employer] of Occupational Health - Occupational Stress Questionnaire    Feeling of Stress : Very much  Social Connections: Moderately Isolated (12/27/2022)    Social Connection and Isolation Panel [NHANES]    Frequency of Communication with Friends and Family: More than three times a week    Frequency of Social Gatherings with Friends and Family: Three times a week    Attends Religious Services: Never    Active Member of Clubs or Organizations: No    Attends Banker Meetings: Never    Marital Status: Married     FAMILY HISTORY:  We obtained a detailed, 4-generation family history.  Significant diagnoses are listed below: Family History  Problem Relation Age of Onset   Breast cancer Mother 38       second cancer at 7   Lung cancer Mother 68   Heart attack Father    Hypertension Father    Diabetes Father    Bladder Cancer Father 80   Lung cancer Maternal Aunt    Brain cancer Paternal Uncle    Thyroid disease Paternal Uncle    Birth defects Maternal Grandmother        ovarian   Depression Maternal Grandmother    Uterine cancer Maternal Grandmother        d. 69s   Brain cancer Paternal Grandmother    Colon cancer Neg Hx    Colon polyps Neg Hx    Esophageal cancer Neg Hx    Stomach cancer Neg Hx    Rectal cancer Neg Hx      Pamela Esparza is unaware of previous family history of genetic testing for hereditary cancer risks. Patient's maternal ancestors are of [place] descent, and paternal ancestors are of [place] descent. There is no reported Ashkenazi Jewish ancestry. There is no known consanguinity.  GENETIC COUNSELING ASSESSMENT: Pamela Esparza is a 57 y.o. female with a family history of cancer which is somewhat suggestive of a hereditary cancer syndrome and predisposition to cancer given the young ages of onset and combination of cancer. We, therefore, discussed and recommended the following at today's visit.   DISCUSSION: We discussed that, in general, most cancer is not inherited in families, but instead is sporadic or familial. Sporadic cancers occur by chance and typically happen at older ages (>50 years) as this type  of cancer is caused by genetic changes acquired during an individual's lifetime. Some families have more cancers than would be expected by chance; however, the ages or types of cancer are not consistent with a known genetic mutation or known genetic mutations have been ruled out. This type of familial cancer is thought to be due to a combination of multiple genetic, environmental, hormonal, and lifestyle factors. While this combination of factors likely increases the risk of cancer, the exact source of this risk is  not currently identifiable or testable.  We discussed that 5 - 10% of breast cancer is hereditary, with most cases associated with BRCA mutations.  There are other genes that can be associated with hereditary breast cancer syndromes.  These include ATM, CHEK2 and PALB2.  We discussed that testing is beneficial for several reasons including knowing how to follow individuals after completing their treatment, identifying whether potential treatment options such as PARP inhibitors would be beneficial, and understand if other family members could be at risk for cancer and allow them to undergo genetic testing.   We reviewed the characteristics, features and inheritance patterns of hereditary cancer syndromes. We also discussed genetic testing, including the appropriate family members to test, the process of testing, insurance coverage and turn-around-time for results. We discussed the implications of a negative, positive, carrier and/or variant of uncertain significant result. Pamela Esparza  was offered a common hereditary cancer panel (47 genes) and an expanded pan-cancer panel (77 genes). Pamela Esparza was informed of the benefits and limitations of each panel, including that expanded pan-cancer panels contain genes that do not have clear management guidelines at this point in time.  We also discussed that as the number of genes included on a panel increases, the chances of variants of uncertain significance  increases. Pamela Esparza decided to pursue genetic testing for the CancerNext-Expanded+RNAinsight gene panel.   The CancerNext-Expanded gene panel offered by [employer] and includes sequencing and rearrangement analysis for the following 71 genes: AIP, ALK, APC, ATM, BAP1, BARD1, BMPR1A, BRCA1, BRCA2, BRIP1, CDC73, CDH1, CDK4, CDKN1B, CDKN2A, CHEK2, DICER1, FH, FLCN, KIF1B, LZTR1, MAX, MEN1, MET, MLH1, MSH2, MSH6, MUTYH, NF1, NF2, NTHL1, PALB2, PHOX2B, PMS2, POT1, PRKAR1A, PTCH1, PTEN, RAD51C, RAD51D, RB1, RET, SDHA, SDHAF2, SDHB, SDHC, SDHD, SMAD4, SMARCA4, SMARCB1, SMARCE1, STK11, SUFU, TMEM127, TP53, TSC1, TSC2 and VHL (sequencing and deletion/duplication); AXIN2, CTNNA1, EGFR, EGLN1, HOXB13, KIT, MITF, MSH3, PDGFRA, POLD1 and POLE (sequencing only); EPCAM and GREM1 (deletion/duplication only). RNA data is routinely analyzed for use in variant interpretation for all genes.   Based on Pamela Esparza's family history of cancer, she meets medical criteria for genetic testing. Despite that she meets criteria, she may still have an out of pocket cost. We discussed that if her out of pocket cost for testing is over $100, the laboratory will call and confirm whether she wants to proceed with testing.  If the out of pocket cost of testing is less than $100 she will be billed by the genetic testing laboratory.   We discussed that some people do not want to undergo genetic testing due to fear of genetic discrimination.  The Genetic Information Nondiscrimination Act (GINA) was signed into federal law in 2008. GINA prohibits health insurers and most employers from discriminating against individuals based on genetic information (including the results of genetic tests and family history information). According to GINA, health insurance companies cannot consider genetic information to be a preexisting condition, nor can they use it to make decisions regarding coverage or rates. GINA also makes it illegal for most employers  to use genetic information in making decisions about hiring, firing, promotion, or terms of employment. It is important to note that GINA does not offer protections for life insurance, disability insurance, or long-term care insurance. GINA does not apply to those in the [employer], those who work for companies with less than 15 employees, and new life insurance or long-term disability insurance policies.  Health status due to a cancer diagnosis is not protected under GINA. More  information about GINA can be found by visiting [email or web address].  PLAN: After considering the risks, benefits, and limitations, Pamela Esparza provided informed consent to pursue genetic testing and the blood sample was sent to [facility] for analysis of the CancerNext-Expanded+RNAinsight. Results should be available within approximately 2-3 weeks' time, at which point they will be disclosed by telephone to Pamela Esparza, as will any additional recommendations warranted by these results. Pamela Esparza will receive a summary of her genetic counseling visit and a copy of her results once available. This information will also be available in Epic.   Lastly, we encouraged Pamela Esparza to remain in contact with cancer genetics annually so that we can continuously update the family history and inform her of any changes in cancer genetics and testing that may be of benefit for this family.   Pamela Esparza questions were answered to her satisfaction today. Our contact information was provided should additional questions or concerns arise. Thank you for the referral and allowing [place] to share in the care of your patient.   Leandrew Keech P. Lowell Guitar, MS, [facility] Licensed, Patent attorney [email or web address] phone: [phone]  The patient was seen for a total of 35 minutes in face-to-face genetic counseling.  The patient was seen alone.  Drs. Meliton Rattan, and/or [place] were available for questions, if needed..     _______________________________________________________________________ For Office Staff:  Number of people involved in session: 1 Was an Intern/ student involved with case: no

## 2023-02-22 ENCOUNTER — Ambulatory Visit (AMBULATORY_SURGERY_CENTER): Payer: Federal, State, Local not specified - PPO | Admitting: Internal Medicine

## 2023-02-22 ENCOUNTER — Encounter: Payer: Self-pay | Admitting: Internal Medicine

## 2023-02-22 VITALS — BP 110/83 | HR 58 | Temp 97.5°F | Resp 10 | Ht 65.0 in | Wt 280.0 lb

## 2023-02-22 DIAGNOSIS — Z86010 Personal history of colonic polyps: Secondary | ICD-10-CM

## 2023-02-22 DIAGNOSIS — Z1211 Encounter for screening for malignant neoplasm of colon: Secondary | ICD-10-CM | POA: Diagnosis not present

## 2023-02-22 DIAGNOSIS — Z09 Encounter for follow-up examination after completed treatment for conditions other than malignant neoplasm: Secondary | ICD-10-CM | POA: Diagnosis not present

## 2023-02-22 MED ORDER — SODIUM CHLORIDE 0.9 % IV SOLN
500.0000 mL | INTRAVENOUS | Status: DC
Start: 2023-02-22 — End: 2023-02-22

## 2023-02-22 NOTE — Progress Notes (Signed)
GASTROENTEROLOGY PROCEDURE H&P NOTE   Primary Care Physician: Carlean Jews, NP    Reason for Procedure:   History of colon polyps  Plan:    Colonoscopy  Patient is appropriate for endoscopic procedure(s) in the ambulatory ([facility]) setting.  The nature of the procedure, as well as the risks, benefits, and alternatives were carefully and thoroughly reviewed with the patient. Ample time for discussion and questions allowed. The patient understood, was satisfied, and agreed to proceed.     HPI: Pamela Esparza is a 57 y.o. female who presents for colonoscopy for history of colon polyps. Denies blood in stools, changes in bowel habits, or unintentional weight loss. She has had some RUQ ab pain. Denies family history of colon cancer.   Past Medical History:  Diagnosis Date   Anxiety    Arthritis    Asthma    Cataract    DDD (degenerative disc disease), lumbar    Depression    Depression    Phreesia 10/13/2020   Family history of bladder cancer    Family history of brain cancer 02/15/2023   Fibromyalgia    Hyperlipidemia    Joint pain    Neuromuscular disorder (HCC)    Phreesia 10/13/2020   Neuropathy    Osteoporosis    Sjogren's disease (HCC)    Thyroid disease     Past Surgical History:  Procedure Laterality Date   ABDOMINAL HYSTERECTOMY     ANKLE SURGERY Right    APPENDECTOMY     CHOLECYSTECTOMY     ESOPHAGOGASTRODUODENOSCOPY (EGD) WITH PROPOFOL N/A 12/05/2020   Procedure: ESOPHAGOGASTRODUODENOSCOPY (EGD) WITH PROPOFOL;  Surgeon: Pasty Spillers, MD;  Location: [facility];  Service: Endoscopy;  Laterality: N/A;   finger sugery     NECK SURGERY     TENDON TRANSFER Right 11/06/2019   Procedure: PERONEAL RECONSTRUCTION WITH PERONEAL LONGUS RIGHT LEG;  Surgeon: Nadara Mustard, MD;  Location: [facility];  Service: Orthopedics;  Laterality: Right;   TUBAL LIGATION N/A    Phreesia 10/13/2020    Prior to Admission medications    Medication Sig Start Date End Date Taking? Authorizing Provider  atorvastatin (LIPITOR) 10 MG tablet TAKE 1 TABLET(10 MG) BY MOUTH DAILY 11/17/22  Yes Boscia, Heather E, NP  Cholecalciferol (VITAMIN D3) 75 MCG (3000 UT) TABS Take 5,000 Int'l Units by mouth daily.   Yes [provider]  DULoxetine (CYMBALTA) 60 MG capsule Take 120 mg by mouth daily. 12/25/15  Yes [provider]  fluticasone (FLONASE) 50 MCG/ACT nasal spray SHAKE LIQUID AND USE 1 SPRAY IN EACH NOSTRIL DAILY 04/28/22  Yes Abonza, Maritza, PA-C  gabapentin (NEURONTIN) 300 MG capsule Take 600 mg by mouth 2 (two) times daily. 08/17/22  Yes [provider]  gabapentin (NEURONTIN) 600 MG tablet TAKE 1 TABLET(600 MG) BY MOUTH THREE TIMES DAILY 04/28/22  Yes Abonza, Maritza, PA-C  hydroxychloroquine (PLAQUENIL) 200 MG tablet Take 200 mg by mouth 2 (two) times daily. 07/13/21  Yes [provider]  levothyroxine (SYNTHROID) 50 MCG tablet Take 1 tablet (50 mcg total) by mouth daily before breakfast. 08/06/22  Yes Shamleffer, Konrad Dolores, MD  meloxicam (MOBIC) 15 MG tablet Take 15 mg by mouth. 11/11/15  Yes [provider]  Multiple Vitamins-Minerals (CENTRUM SILVER PO) Take by mouth.   Yes [provider]  pilocarpine (SALAGEN) 5 MG tablet Take 5 mg by mouth 3 (three) times daily. 12/11/15  Yes [provider]  albuterol (VENTOLIN HFA) 108 (90 Base) MCG/ACT  inhaler INHALE 1 TO 2 PUFFS INTO THE LUNGS EVERY 6 HOURS AS NEEDED 04/28/22   Mayer Masker, PA-C  ALPRAZolam (XANAX) 0.5 MG tablet Take 0.5 mg by mouth daily as needed.    [provider]  cyclobenzaprine (FLEXERIL) 5 MG tablet TAKE 1 TABLET BY MOUTH AT NIGHT FOR 1 WEEK THEN TAKE 1 TABLET BY MOUTH TWICE DAILY    [provider]  nitroGLYCERIN (NITROSTAT) 0.4 MG SL tablet Place under the tongue.    [provider]  nystatin (MYCOSTATIN/NYSTOP) powder 1 Application.    [provider]   oxyCODONE-acetaminophen (PERCOCET) 10-325 MG tablet Take 1 tablet by mouth 2 (two) times daily. 09/18/18   [provider]    Current Outpatient Medications  Medication Sig Dispense Refill   atorvastatin (LIPITOR) 10 MG tablet TAKE 1 TABLET(10 MG) BY MOUTH DAILY 60 tablet 0   Cholecalciferol (VITAMIN D3) 75 MCG (3000 UT) TABS Take 5,000 Int'l Units by mouth daily.     DULoxetine (CYMBALTA) 60 MG capsule Take 120 mg by mouth daily.     fluticasone (FLONASE) 50 MCG/ACT nasal spray SHAKE LIQUID AND USE 1 SPRAY IN EACH NOSTRIL DAILY 16 g 3   gabapentin (NEURONTIN) 300 MG capsule Take 600 mg by mouth 2 (two) times daily.     gabapentin (NEURONTIN) 600 MG tablet TAKE 1 TABLET(600 MG) BY MOUTH THREE TIMES DAILY 270 tablet 2   hydroxychloroquine (PLAQUENIL) 200 MG tablet Take 200 mg by mouth 2 (two) times daily.     levothyroxine (SYNTHROID) 50 MCG tablet Take 1 tablet (50 mcg total) by mouth daily before breakfast. 90 tablet 3   meloxicam (MOBIC) 15 MG tablet Take 15 mg by mouth.     Multiple Vitamins-Minerals (CENTRUM SILVER PO) Take by mouth.     pilocarpine (SALAGEN) 5 MG tablet Take 5 mg by mouth 3 (three) times daily.     albuterol (VENTOLIN HFA) 108 (90 Base) MCG/ACT inhaler INHALE 1 TO 2 PUFFS INTO THE LUNGS EVERY 6 HOURS AS NEEDED 54 g 1   ALPRAZolam (XANAX) 0.5 MG tablet Take 0.5 mg by mouth daily as needed.     cyclobenzaprine (FLEXERIL) 5 MG tablet TAKE 1 TABLET BY MOUTH AT NIGHT FOR 1 WEEK THEN TAKE 1 TABLET BY MOUTH TWICE DAILY     nitroGLYCERIN (NITROSTAT) 0.4 MG SL tablet Place under the tongue.     nystatin (MYCOSTATIN/NYSTOP) powder 1 Application.     oxyCODONE-acetaminophen (PERCOCET) 10-325 MG tablet Take 1 tablet by mouth 2 (two) times daily.     Current Facility-Administered Medications  Medication Dose Route Frequency Provider Last Rate Last Admin   0.9 %  sodium chloride infusion  500 mL Intravenous Continuous Imogene Burn, MD        Allergies as of 02/22/2023  - Review Complete 02/22/2023  Allergen Reaction Noted   Aspirin Other (See Comments) 03/12/2015   Hydrocodone-acetaminophen Other (See Comments) 10/23/2018   Azithromycin  05/11/2022   Codeine  02/02/2021   Lyrica [pregabalin]  10/23/2018   Solu-medrol [methylprednisolone] Itching 12/28/2021   Topiramate  06/25/2016    Family History  Problem Relation Age of Onset   Breast cancer Mother 36       second cancer at 48   Lung cancer Mother 37   Heart attack Father    Hypertension Father    Diabetes Father    Bladder Cancer Father 80   Lung cancer Maternal Aunt    Brain cancer Paternal Uncle    Thyroid  disease Paternal Uncle    Birth defects Maternal Grandmother        ovarian   Depression Maternal Grandmother    Uterine cancer Maternal Grandmother        d. 66s   Brain cancer Paternal Grandmother    Colon cancer Neg Hx    Colon polyps Neg Hx    Esophageal cancer Neg Hx    Stomach cancer Neg Hx    Rectal cancer Neg Hx     Social History   Socioeconomic History   Marital status: Married    Spouse name: Not on file   Number of children: Not on file   Years of education: Not on file   Highest education level: Not on file  Occupational History   Not on file  Tobacco Use   Smoking status: Never   Smokeless tobacco: Never  Vaping Use   Vaping Use: Never used  Substance and Sexual Activity   Alcohol use: Yes    Comment: 4 drinks a year-rare   Drug use: Never   Sexual activity: Not Currently  Other Topics Concern   Not on file  Social History Narrative   Not on file   Social Determinants of Health   Financial Resource Strain: Low Risk  (12/27/2022)   Overall Financial Resource Strain (CARDIA)    Difficulty of Paying Living Expenses: Not hard at all  Food Insecurity: No Food Insecurity (12/27/2022)   Hunger Vital Sign    Worried About Running Out of Food in the Last Year: Never true    Ran Out of Food in the Last Year: Never true  Transportation Needs: No  Transportation Needs (12/27/2022)   PRAPARE - Administrator, Civil Service (Medical): No    Lack of Transportation (Non-Medical): No  Physical Activity: Inactive (12/27/2022)   Exercise Vital Sign    Days of Exercise per Week: 0 days    Minutes of Exercise per Session: 0 min  Stress: Stress Concern Present (12/27/2022)   [employer] of Occupational Health - Occupational Stress Questionnaire    Feeling of Stress : Very much  Social Connections: Moderately Isolated (12/27/2022)   Social Connection and Isolation Panel [NHANES]    Frequency of Communication with Friends and Family: More than three times a week    Frequency of Social Gatherings with Friends and Family: Three times a week    Attends Religious Services: Never    Active Member of Clubs or Organizations: No    Attends Banker Meetings: Never    Marital Status: Married  Catering manager Violence: Not At Risk (12/27/2022)   Humiliation, Afraid, Rape, and Kick questionnaire    Fear of Current or Ex-Partner: No    Emotionally Abused: No    Physically Abused: No    Sexually Abused: No    Physical Exam: Vital signs in last 24 hours: BP (!) 150/83   Pulse 78   Temp (!) 97.5 F (36.4 C)   Ht 5\' 5"  (1.651 m)   Wt 280 lb (127 kg)   SpO2 99%   BMI 46.59 kg/m  GEN: NAD EYE: Sclerae anicteric ENT: MMM CV: Non-tachycardic Pulm: No increased work of breathing GI: Soft, NT/ND NEURO:  Alert & Oriented   Eulah Pont, MD [facility]  02/22/2023 1:35 PM

## 2023-02-22 NOTE — Patient Instructions (Signed)
YOU HAD AN ENDOSCOPIC PROCEDURE TODAY AT THE [facility]:   Refer to the procedure report that was given to you for any specific questions about what was found during the examination.  If the procedure report does not answer your questions, please call your gastroenterologist to clarify.  If you requested that your care partner not be given the details of your procedure findings, then the procedure report has been included in a sealed envelope for you to review at your convenience later.  YOU SHOULD EXPECT: Some feelings of bloating in the abdomen. Passage of more gas than usual.  Walking can help get rid of the air that was put into your GI tract during the procedure and reduce the bloating. If you had a lower endoscopy (such as a colonoscopy or flexible sigmoidoscopy) you may notice spotting of blood in your stool or on the toilet paper. If you underwent a bowel prep for your procedure, you may not have a normal bowel movement for a few days.  Please Note:  You might notice some irritation and congestion in your nose or some drainage.  This is from the oxygen used during your procedure.  There is no need for concern and it should clear up in a day or so.  SYMPTOMS TO REPORT IMMEDIATELY:  Following lower endoscopy (colonoscopy or flexible sigmoidoscopy):  Excessive amounts of blood in the stool  Significant tenderness or worsening of abdominal pains  Swelling of the abdomen that is new, acute  Fever of 100F or higher  For urgent or emergent issues, a gastroenterologist can be reached at any hour by calling [phone]. Do not use MyChart messaging for urgent concerns.    DIET:  We do recommend a small meal at first, but then you may proceed to your regular diet.  Drink plenty of fluids but you should avoid alcoholic beverages for 24 hours.  ACTIVITY:  You should plan to take it easy for the rest of today and you should NOT DRIVE or use heavy machinery until tomorrow (because of  the sedation medicines used during the test).    FOLLOW UP: Our staff will call the number listed on your records the next business day following your procedure.  We will call around 7:15- 8:00 am to check on you and address any questions or concerns that you may have regarding the information given to you following your procedure. If we do not reach you, we will leave a message.     If any biopsies were taken you will be contacted by phone or by letter within the next 1-3 weeks.  Please call us at [phone] if you have not heard about the biopsies in 3 weeks.    SIGNATURES/CONFIDENTIALITY: You and/or your care partner have signed paperwork which will be entered into your electronic medical record.  These signatures attest to the fact that that the information above on your After Visit Summary has been reviewed and is understood.  Full responsibility of the confidentiality of this discharge information lies with you and/or your care-partner.  

## 2023-02-22 NOTE — Progress Notes (Signed)
Patient reports no changes to health or medications since pre visit. 

## 2023-02-22 NOTE — Op Note (Signed)
[facility] Patient Name: Pamela Esparza Procedure Date: 02/22/2023 1:15 PM MRN: [id number] Endoscopist: Particia Lather , , [id number] Age: 57 Referring MD:  Date of Birth: 14-Apr-1966 Gender: Female Account #: [id number] Procedure:                Colonoscopy Indications:              High risk colon cancer surveillance: Personal                            history of colonic polyps Medicines:                Monitored Anesthesia Care Procedure:                Pre-Anesthesia Assessment:                           - Prior to the procedure, a History and Physical                            was performed, and patient medications and                            allergies were reviewed. The patient's tolerance of                            previous anesthesia was also reviewed. The risks                            and benefits of the procedure and the sedation                            options and risks were discussed with the patient.                            All questions were answered, and informed consent                            was obtained. Prior Anticoagulants: The patient has                            taken no anticoagulant or antiplatelet agents. ASA                            Grade Assessment: III - A patient with severe                            systemic disease. After reviewing the risks and                            benefits, the patient was deemed in satisfactory                            condition to undergo the procedure.  After obtaining informed consent, the colonoscope                            was passed under direct vision. Throughout the                            procedure, the patient's blood pressure, pulse, and                            oxygen saturations were monitored continuously. The                            Olympus CF-HQ190L ([id number]) Colonoscope was                            introduced through the anus and  advanced to the the                            terminal ileum. Scope In: 1:37:41 PM Scope Out: 1:53:59 PM Scope Withdrawal Time: 0 hours 12 minutes 17 seconds  Total Procedure Duration: 0 hours 16 minutes 18 seconds  Findings:                 The terminal ileum appeared normal.                           Non-bleeding internal hemorrhoids were found during                            retroflexion. Complications:            No immediate complications. Estimated Blood Loss:     Estimated blood loss was minimal. Impression:               - The examined portion of the ileum was normal.                           - Non-bleeding internal hemorrhoids.                           - No specimens collected. Recommendation:           - Discharge patient to home (with escort).                           - Repeat colonoscopy in 5 years for surveillance.                           - The findings and recommendations were discussed                            with the patient. Dr Particia Lather [address]" [place],  02/22/2023 2:04:22 PM

## 2023-02-22 NOTE — Progress Notes (Signed)
Sedate, gd SR's, VSS, report to RN 

## 2023-02-23 ENCOUNTER — Telehealth: Payer: Self-pay | Admitting: *Deleted

## 2023-02-23 NOTE — Telephone Encounter (Signed)
  Follow up Call-     02/22/2023   12:53 PM  Call back number  Post procedure Call Back phone  # [phone]  Permission to leave phone message Yes     Patient questions:  Message left to call us if necessary.

## 2023-02-25 ENCOUNTER — Encounter: Payer: Self-pay | Admitting: Genetic Counselor

## 2023-02-25 ENCOUNTER — Telehealth: Payer: Self-pay | Admitting: Genetic Counselor

## 2023-02-25 DIAGNOSIS — Z1379 Encounter for other screening for genetic and chromosomal anomalies: Secondary | ICD-10-CM | POA: Insufficient documentation

## 2023-02-25 NOTE — Telephone Encounter (Signed)
Disclosed negative genetic testing results. Shared the importance of continuing mammograms based on family history of breast cancer. Recommended genetic testing for mom. Provided the genetic counselor Karen's direct phone line in case there are further questions.

## 2023-02-28 ENCOUNTER — Ambulatory Visit: Payer: Self-pay | Admitting: Genetic Counselor

## 2023-02-28 DIAGNOSIS — Z1379 Encounter for other screening for genetic and chromosomal anomalies: Secondary | ICD-10-CM

## 2023-02-28 NOTE — Progress Notes (Signed)
HPI:  [name] was previously seen in the [facility] due to a family history of breast cancer and concerns regarding a hereditary predisposition to cancer. Please refer to our prior cancer genetics clinic note for more information regarding our discussion, assessment and recommendations, at the time. [name] recent genetic test results were disclosed to her, as were recommendations warranted by these results. These results and recommendations are discussed in more detail below.  CANCER HISTORY:  Oncology History   No history exists.    FAMILY HISTORY:  We obtained a detailed, 4-generation family history.  Significant diagnoses are listed below: Family History  Problem Relation Age of Onset   Breast cancer Mother 22       second cancer at 40   Lung cancer Mother 73   Heart attack Father    Hypertension Father    Diabetes Father    Bladder Cancer Father 73   Lung cancer Maternal Aunt    Brain cancer Paternal Uncle    Thyroid disease Paternal Uncle    Birth defects Maternal Grandmother        ovarian   Depression Maternal Grandmother    Uterine cancer Maternal Grandmother        d. 32s   Brain cancer Paternal Grandmother    Colon cancer Neg Hx    Colon polyps Neg Hx    Esophageal cancer Neg Hx    Stomach cancer Neg Hx    Rectal cancer Neg Hx        Pamela Esparza is unaware of previous family history of genetic testing for hereditary cancer risks. Patient's maternal ancestors are of [place] descent, and paternal ancestors are of [place] descent. There is no reported Ashkenazi Jewish ancestry. There is no known consanguinity  GENETIC TEST RESULTS: Genetic testing reported out on Feb 24, 2023 through the CancerNext-Expanded+RNAinsight cancer panel found no pathogenic mutations. The CancerNext-Expanded gene panel offered by [facility] and includes sequencing and rearrangement analysis for the following 77 genes: AIP, ALK, APC*, ATM*, AXIN2, BAP1,  BARD1, BMPR1A, BRCA1*, BRCA2*, BRIP1*, CDC73, CDH1*, CDK4, CDKN1B, CDKN2A, CHEK2*, CTNNA1, DICER1, FH, FLCN, KIF1B, LZTR1, MAX, MEN1, MET, MLH1*, MSH2*, MSH3, MSH6*, MUTYH*, NF1*, NF2, NTHL1, PALB2*, PHOX2B, PMS2*, POT1, PRKAR1A, PTCH1, PTEN*, RAD51C*, RAD51D*, RB1, RET, SDHA, SDHAF2, SDHB, SDHC, SDHD, SMAD4, SMARCA4, SMARCB1, SMARCE1, STK11, SUFU, TMEM127, TP53*, TSC1, TSC2, and VHL (sequencing and deletion/duplication); EGFR, EGLN1, HOXB13, KIT, MITF, PDGFRA, POLD1, and POLE (sequencing only); EPCAM and GREM1 (deletion/duplication only). DNA and RNA analyses performed for * genes. The test report has been scanned into EPIC and is located under the Molecular Pathology section of the Results Review tab.  A portion of the result report is included below for reference.     We discussed with [name] that because current genetic testing is not perfect, it is possible there may be a gene mutation in one of these genes that current testing cannot detect, but that chance is small.  We also discussed, that there could be another gene that has not yet been discovered, or that we have not yet tested, that is responsible for the cancer diagnoses in the family. It is also possible there is a hereditary cause for the cancer in the family that [name] did not inherit and therefore was not identified in her testing.  Therefore, it is important to remain in touch with cancer genetics in the future so that we can continue to offer [name] the most up to  date genetic testing.   ADDITIONAL GENETIC TESTING: We discussed with [name] that her genetic testing was fairly extensive.  If there are genes identified to increase cancer risk that can be analyzed in the future, we would be happy to discuss and coordinate this testing at that time.    CANCER SCREENING RECOMMENDATIONS: [name] test result is considered negative (normal).  This means that we have not identified a hereditary cause for her family history of  breast cancer at this time. Most cancers happen by chance and this negative test suggests that her cancer may fall into this category.    Possible reasons for [name]'s negative genetic test include:  1. There may be a gene mutation in one of these genes that current testing methods cannot detect but that chance is small.  2. There could be another gene that has not yet been discovered, or that we have not yet tested, that is responsible for the cancer diagnoses in the family.  3.  There may be no hereditary risk for cancer in the family. The cancers in [name] and/or her family may be sporadic/familial or due to other genetic and environmental factors. 4. It is also possible there is a hereditary cause for the cancer in the family that [name] did not inherit.  Therefore, it is recommended she continue to follow the cancer management and screening guidelines provided by her primary healthcare provider. An individual's cancer risk and medical management are not determined by genetic test results alone. Overall cancer risk assessment incorporates additional factors, including personal medical history, family history, and any available genetic information that may result in a personalized plan for cancer prevention and surveillance  RECOMMENDATIONS FOR FAMILY MEMBERS:  Individuals in this family might be at some increased risk of developing cancer, over the general population risk, simply due to the family history of cancer.  We recommended women in this family have a yearly mammogram beginning at age 64, or 81 years younger than the earliest onset of cancer, an annual clinical breast exam, and perform monthly breast self-exams. Women in this family should also have a gynecological exam as recommended by their primary provider. All family members should be referred for colonoscopy starting at age 80.  It is also possible there is a hereditary cause for the cancer in [name]'s family that she  did not inherit and therefore was not identified in her.  Based on [name]'s family history, we recommended her mother, who was diagnosed with breast cancer at age 38, have genetic counseling and testing. Ms. Cui will let us know if we can be of any assistance in coordinating genetic counseling and/or testing for this family member.   FOLLOW-UP: Lastly, we discussed with Ms. Evangelist that cancer genetics is a rapidly advancing field and it is possible that new genetic tests will be appropriate for her and/or her family members in the future. We encouraged her to remain in contact with cancer genetics on an annual basis so we can update her personal and family histories and let her know of advances in cancer genetics that may benefit this family.   Our contact number was provided. Ms. Smathers questions were answered to her satisfaction, and she knows she is welcome to call us at anytime with additional questions or concerns.   Maylon Cos, MS, [facility] Licensed, Certified Genetic Counselor [email or web address]

## 2023-03-02 ENCOUNTER — Encounter: Payer: Self-pay | Admitting: Nurse Practitioner

## 2023-03-02 ENCOUNTER — Ambulatory Visit (INDEPENDENT_AMBULATORY_CARE_PROVIDER_SITE_OTHER): Payer: Federal, State, Local not specified - PPO | Admitting: Nurse Practitioner

## 2023-03-02 VITALS — BP 137/77 | HR 66 | Ht 65.0 in | Wt 289.8 lb

## 2023-03-02 DIAGNOSIS — J209 Acute bronchitis, unspecified: Secondary | ICD-10-CM

## 2023-03-02 DIAGNOSIS — T3695XA Adverse effect of unspecified systemic antibiotic, initial encounter: Secondary | ICD-10-CM

## 2023-03-02 DIAGNOSIS — B379 Candidiasis, unspecified: Secondary | ICD-10-CM

## 2023-03-02 MED ORDER — AZITHROMYCIN 250 MG PO TABS
ORAL_TABLET | ORAL | 0 refills | Status: DC
Start: 2023-03-02 — End: 2023-03-23

## 2023-03-02 MED ORDER — FLUCONAZOLE 150 MG PO TABS
ORAL_TABLET | ORAL | 0 refills | Status: DC
Start: 2023-03-02 — End: 2023-03-23

## 2023-03-02 MED ORDER — PREDNISONE 10 MG (21) PO TBPK
ORAL_TABLET | ORAL | 0 refills | Status: DC
Start: 2023-03-02 — End: 2023-05-24

## 2023-03-02 NOTE — Progress Notes (Unsigned)
Established patient visit   Patient: Pamela Esparza   DOB: 12-08-1965   57 y.o. Female  MRN: [id number] Visit Date: 03/02/2023   Chief Complaint  Patient presents with   Medical Management of Chronic Issues   Subjective    Patient states that she has had swelling and itching in left eye -present for about 2 weeks  -eye got red and puffy -has gradually improved   Shortness of Breath This is a new problem. The current episode started more than 1 month ago. The problem occurs daily (has to use rescue inhaler 2 to 3 times per day). The problem has been unchanged. Associated symptoms include neck pain, orthopnea, PND, sputum production and wheezing. Pertinent negatives include no ear pain, fever, headaches, rhinorrhea, sore throat, swollen glands, syncope or vomiting. The symptoms are aggravated by exercise, pollens and any activity. Associated symptoms comments: Chest heaviness . She has tried rest, body position changes and steroid inhalers for the symptoms. The treatment provided mild relief. Her past medical history is significant for allergies and asthma.      Medications: Outpatient Medications Prior to Visit  Medication Sig   albuterol (VENTOLIN HFA) 108 (90 Base) MCG/ACT inhaler INHALE 1 TO 2 PUFFS INTO THE LUNGS EVERY 6 HOURS AS NEEDED   ALPRAZolam (XANAX) 0.5 MG tablet Take 0.5 mg by mouth daily as needed.   atorvastatin (LIPITOR) 10 MG tablet TAKE 1 TABLET(10 MG) BY MOUTH DAILY   Cholecalciferol (VITAMIN D3) 75 MCG (3000 UT) TABS Take 5,000 Int'l Units by mouth daily.   cyclobenzaprine (FLEXERIL) 5 MG tablet TAKE 1 TABLET BY MOUTH AT NIGHT FOR 1 WEEK THEN TAKE 1 TABLET BY MOUTH TWICE DAILY   DULoxetine (CYMBALTA) 60 MG capsule Take 120 mg by mouth daily.   fluticasone (FLONASE) 50 MCG/ACT nasal spray SHAKE LIQUID AND USE 1 SPRAY IN EACH NOSTRIL DAILY   gabapentin (NEURONTIN) 300 MG capsule Take 600 mg by mouth 2 (two) times daily.   gabapentin (NEURONTIN) 600 MG tablet TAKE 1  TABLET(600 MG) BY MOUTH THREE TIMES DAILY   hydroxychloroquine (PLAQUENIL) 200 MG tablet Take 200 mg by mouth 2 (two) times daily.   levothyroxine (SYNTHROID) 50 MCG tablet Take 1 tablet (50 mcg total) by mouth daily before breakfast.   meloxicam (MOBIC) 15 MG tablet Take 15 mg by mouth.   Multiple Vitamins-Minerals (CENTRUM SILVER PO) Take by mouth.   nitroGLYCERIN (NITROSTAT) 0.4 MG SL tablet Place under the tongue.   nystatin (MYCOSTATIN/NYSTOP) powder 1 Application.   oxyCODONE-acetaminophen (PERCOCET) 10-325 MG tablet Take 1 tablet by mouth 2 (two) times daily.   pilocarpine (SALAGEN) 5 MG tablet Take 5 mg by mouth 3 (three) times daily.   No facility-administered medications prior to visit.    Review of Systems  Constitutional:  Negative for fever.  HENT:  Negative for ear pain, rhinorrhea and sore throat.   Respiratory:  Positive for sputum production, shortness of breath and wheezing.   Cardiovascular:  Positive for orthopnea and PND. Negative for syncope.  Gastrointestinal:  Negative for vomiting.  Musculoskeletal:  Positive for neck pain.  Neurological:  Negative for headaches.    Last CBC Lab Results  Component Value Date   WBC 6.7 12/20/2022   HGB 14.2 12/20/2022   HCT 42.6 12/20/2022   MCV 95 12/20/2022   MCH 31.5 12/20/2022   RDW 13.0 12/20/2022   PLT 286 12/20/2022   Last metabolic panel Lab Results  Component Value Date   GLUCOSE 104 (H) 12/20/2022  NA 143 12/20/2022   K 4.0 12/20/2022   CL 103 12/20/2022   CO2 23 12/20/2022   BUN 13 12/20/2022   CREATININE 0.78 12/20/2022   EGFR 88 12/20/2022   CALCIUM 9.5 12/20/2022   PROT 7.1 12/20/2022   ALBUMIN 4.2 12/20/2022   LABGLOB 2.9 12/20/2022   AGRATIO 1.4 12/20/2022   BILITOT 0.5 12/20/2022   ALKPHOS 117 12/20/2022   AST 25 12/20/2022   ALT 22 12/20/2022   Last lipids Lab Results  Component Value Date   CHOL 175 12/20/2022   HDL 81 12/20/2022   LDLCALC 78 12/20/2022   LDLDIRECT 146 (H)  08/14/2019   TRIG 89 12/20/2022   CHOLHDL 2.2 12/20/2022   Last hemoglobin A1c Lab Results  Component Value Date   HGBA1C 5.6 12/20/2022   Last thyroid functions Lab Results  Component Value Date   TSH 2.940 12/20/2022   T3TOTAL 115 10/14/2020   Last vitamin D Lab Results  Component Value Date   VD25OH 50.25 09/14/2021       Objective     Today's Vitals   03/02/23 1434 03/02/23 1447  BP: (Abnormal) 155/85 137/77  Pulse: 66   SpO2: 99%   Weight: 289 lb 12.8 oz (131.5 kg)   Height: 5\' 5"  (1.651 m)    Body mass index is 48.23 kg/m.  BP Readings from Last 3 Encounters:  03/23/23 129/83  03/02/23 137/77  02/22/23 110/83    Wt Readings from Last 3 Encounters:  03/23/23 289 lb 1.9 oz (131.1 kg)  03/02/23 289 lb 12.8 oz (131.5 kg)  02/22/23 280 lb (127 kg)    Physical Exam Vitals and nursing note reviewed.  Constitutional:      Appearance: Normal appearance. She is well-developed. She is obese. She is ill-appearing.  HENT:     Head: Normocephalic and atraumatic.     Nose: Congestion present.     Mouth/Throat:     Mouth: Mucous membranes are moist.     Pharynx: Oropharynx is clear. Posterior oropharyngeal erythema present.  Eyes:     Extraocular Movements: Extraocular movements intact.     Conjunctiva/sclera: Conjunctivae normal.     Pupils: Pupils are equal, round, and reactive to light.  Neck:     Vascular: No carotid bruit.  Cardiovascular:     Rate and Rhythm: Normal rate and regular rhythm.     Pulses: Normal pulses.     Heart sounds: Normal heart sounds.  Pulmonary:     Effort: Pulmonary effort is normal.     Breath sounds: Wheezing and rhonchi present.     Comments: Congested and non productive cough noted  Abdominal:     Palpations: Abdomen is soft.  Musculoskeletal:        General: Normal range of motion.     Cervical back: Normal range of motion and neck supple.  Lymphadenopathy:     Cervical: Cervical adenopathy present.  Skin:     General: Skin is warm and dry.     Capillary Refill: Capillary refill takes less than 2 seconds.  Neurological:     General: No focal deficit present.     Mental Status: She is alert and oriented to person, place, and time.  Psychiatric:        Mood and Affect: Mood normal.        Behavior: Behavior normal.        Thought Content: Thought content normal.        Judgment: Judgment normal.  Assessment & Plan    Acute bronchitis, unspecified organism Assessment & Plan: Start z-pack.  -take as directed for 5 days  -Rest and increase fluids. Continue using OTC medication to control symptoms.   -add medrol taper. Take as directed for 6 days   Orders: -     predniSONE; 6 day taper - take by mouth as directed for 6 days  Dispense: 21 tablet; Refill: 0  Antibiotic-induced yeast infection Assessment & Plan: May take diflucan 150 mg if yeast infection develops.       Return in about 3 weeks (around 03/23/2023) for discuss weight management .         Carlean Jews, NP  [facility] [phone] (phone) [phone] (fax)  [facility]

## 2023-03-22 NOTE — Progress Notes (Signed)
Established patient visit   Patient: Pamela Esparza   DOB: Apr 27, 1966   57 y.o. Female  MRN: [id number] Visit Date: 03/23/2023   Chief Complaint  Patient presents with   Weight Check   Subjective    HPI  Follow up to discuss weight management.  -?what will insurance cover. Was asked to check this prior to visit.  -insurance does cover Wegovy to help with weight management. She would like to give this medication a try. -She has no new complaints concerns or complaints today.  Feels better after treatment for bacterial sinusitis. -She denies chest pain, chest pressure, or shortness of breath. She denies headaches or visual disturbances. She denies abdominal pain, nausea, vomiting, or changes in bowel or bladder habits.    Medications: Outpatient Medications Prior to Visit  Medication Sig   albuterol (VENTOLIN HFA) 108 (90 Base) MCG/ACT inhaler INHALE 1 TO 2 PUFFS INTO THE LUNGS EVERY 6 HOURS AS NEEDED   ALPRAZolam (XANAX) 0.5 MG tablet Take 0.5 mg by mouth daily as needed.   atorvastatin (LIPITOR) 10 MG tablet TAKE 1 TABLET(10 MG) BY MOUTH DAILY   Cholecalciferol (VITAMIN D3) 75 MCG (3000 UT) TABS Take 5,000 Int'l Units by mouth daily.   cyclobenzaprine (FLEXERIL) 5 MG tablet TAKE 1 TABLET BY MOUTH AT NIGHT FOR 1 WEEK THEN TAKE 1 TABLET BY MOUTH TWICE DAILY   DULoxetine (CYMBALTA) 60 MG capsule Take 120 mg by mouth daily.   fluticasone (FLONASE) 50 MCG/ACT nasal spray SHAKE LIQUID AND USE 1 SPRAY IN EACH NOSTRIL DAILY   gabapentin (NEURONTIN) 300 MG capsule Take 600 mg by mouth 2 (two) times daily.   gabapentin (NEURONTIN) 600 MG tablet TAKE 1 TABLET(600 MG) BY MOUTH THREE TIMES DAILY   hydroxychloroquine (PLAQUENIL) 200 MG tablet Take 200 mg by mouth 2 (two) times daily.   levothyroxine (SYNTHROID) 50 MCG tablet Take 1 tablet (50 mcg total) by mouth daily before breakfast.   meloxicam (MOBIC) 15 MG tablet Take 15 mg by mouth.   Multiple Vitamins-Minerals (CENTRUM SILVER PO) Take by  mouth.   nitroGLYCERIN (NITROSTAT) 0.4 MG SL tablet Place under the tongue.   nystatin (MYCOSTATIN/NYSTOP) powder 1 Application.   oxyCODONE-acetaminophen (PERCOCET) 10-325 MG tablet Take 1 tablet by mouth 2 (two) times daily.   pilocarpine (SALAGEN) 5 MG tablet Take 5 mg by mouth 3 (three) times daily.   predniSONE (STERAPRED UNI-PAK 21 TAB) 10 MG (21) TBPK tablet 6 day taper - take by mouth as directed for 6 days   [DISCONTINUED] azithromycin (ZITHROMAX) 250 MG tablet z-pack - take as directed for 5 days   [DISCONTINUED] fluconazole (DIFLUCAN) 150 MG tablet Take 1 tablet po once. May repeat dose in 3 days as needed for persistent symptoms.   No facility-administered medications prior to visit.    Review of Systems See HPI    Last CBC Lab Results  Component Value Date   WBC 6.7 12/20/2022   HGB 14.2 12/20/2022   HCT 42.6 12/20/2022   MCV 95 12/20/2022   MCH 31.5 12/20/2022   RDW 13.0 12/20/2022   PLT 286 12/20/2022   Last metabolic panel Lab Results  Component Value Date   GLUCOSE 104 (H) 12/20/2022   NA 143 12/20/2022   K 4.0 12/20/2022   CL 103 12/20/2022   CO2 23 12/20/2022   BUN 13 12/20/2022   CREATININE 0.78 12/20/2022   EGFR 88 12/20/2022   CALCIUM 9.5 12/20/2022   PROT 7.1 12/20/2022   ALBUMIN 4.2 12/20/2022  LABGLOB 2.9 12/20/2022   AGRATIO 1.4 12/20/2022   BILITOT 0.5 12/20/2022   ALKPHOS 117 12/20/2022   AST 25 12/20/2022   ALT 22 12/20/2022   Last lipids Lab Results  Component Value Date   CHOL 175 12/20/2022   HDL 81 12/20/2022   LDLCALC 78 12/20/2022   LDLDIRECT 146 (H) 08/14/2019   TRIG 89 12/20/2022   CHOLHDL 2.2 12/20/2022   Last hemoglobin A1c Lab Results  Component Value Date   HGBA1C 5.6 12/20/2022   Last thyroid functions Lab Results  Component Value Date   TSH 2.940 12/20/2022   T3TOTAL 115 10/14/2020   Last vitamin D Lab Results  Component Value Date   VD25OH 50.25 09/14/2021       Objective     Today's Vitals    03/23/23 1034  BP: 129/83  Pulse: 65  SpO2: 95%  Weight: 289 lb 1.9 oz (131.1 kg)  Height: 5\' 5"  (1.651 m)   Body mass index is 48.11 kg/m.  BP Readings from Last 3 Encounters:  03/23/23 129/83  03/02/23 137/77  02/22/23 110/83    Wt Readings from Last 3 Encounters:  03/23/23 289 lb 1.9 oz (131.1 kg)  03/02/23 289 lb 12.8 oz (131.5 kg)  02/22/23 280 lb (127 kg)    Physical Exam Vitals and nursing note reviewed.  Constitutional:      Appearance: Normal appearance. She is well-developed. She is obese.  HENT:     Head: Normocephalic and atraumatic.     Nose: Nose normal.     Mouth/Throat:     Mouth: Mucous membranes are moist.     Pharynx: Oropharynx is clear.  Eyes:     Extraocular Movements: Extraocular movements intact.     Conjunctiva/sclera: Conjunctivae normal.     Pupils: Pupils are equal, round, and reactive to light.  Neck:     Vascular: No carotid bruit.  Cardiovascular:     Rate and Rhythm: Normal rate and regular rhythm.     Pulses: Normal pulses.     Heart sounds: Normal heart sounds.  Pulmonary:     Effort: Pulmonary effort is normal.     Breath sounds: Normal breath sounds.  Abdominal:     Palpations: Abdomen is soft.  Musculoskeletal:        General: Normal range of motion.     Cervical back: Normal range of motion and neck supple.  Lymphadenopathy:     Cervical: No cervical adenopathy.  Skin:    General: Skin is warm and dry.     Capillary Refill: Capillary refill takes less than 2 seconds.  Neurological:     General: No focal deficit present.     Mental Status: She is alert and oriented to person, place, and time.  Psychiatric:        Mood and Affect: Mood normal.        Behavior: Behavior normal.        Thought Content: Thought content normal.        Judgment: Judgment normal.      Assessment & Plan    Class 3 severe obesity due to excess calories with serious comorbidity and body mass index (BMI) of 45.0 to 49.9 in adult  [facility]) Assessment & Plan: Class III obesity along with hypertension, hypothyroid, and chronic pain. Start Wegovy 0.25 mg weekly. Discussed lowering calorie intake to 1500 calories per day and incorporating exercise into daily routine to help lose weight.  Increase Wegovy dose to 0.5 mg weekly after the  first 3 weeks if tolerated well. Reassess in 2 months.  Orders: -     Wegovy; Inject 0.25 mg into the skin once a week.  Dispense: 2 mL; Refill: 1  Acquired hypothyroidism Assessment & Plan: Stable. Continue current medication.   Orders: -     Wegovy; Inject 0.25 mg into the skin once a week.  Dispense: 2 mL; Refill: 1  Essential (primary) hypertension Assessment & Plan: Stable. Continue current medication.       Return in about 2 months (around 05/23/2023) for routine - weight management. started wegovy .         Carlean Jews, NP  [facility] at [facility] [phone] (phone) [phone] (fax)  [facility]

## 2023-03-23 ENCOUNTER — Ambulatory Visit (INDEPENDENT_AMBULATORY_CARE_PROVIDER_SITE_OTHER): Payer: Federal, State, Local not specified - PPO | Admitting: Nurse Practitioner

## 2023-03-23 ENCOUNTER — Encounter: Payer: Self-pay | Admitting: Nurse Practitioner

## 2023-03-23 VITALS — BP 129/83 | HR 65 | Ht 65.0 in | Wt 289.1 lb

## 2023-03-23 DIAGNOSIS — T3695XA Adverse effect of unspecified systemic antibiotic, initial encounter: Secondary | ICD-10-CM | POA: Insufficient documentation

## 2023-03-23 DIAGNOSIS — I10 Essential (primary) hypertension: Secondary | ICD-10-CM | POA: Diagnosis not present

## 2023-03-23 DIAGNOSIS — Z6841 Body Mass Index (BMI) 40.0 and over, adult: Secondary | ICD-10-CM

## 2023-03-23 DIAGNOSIS — B379 Candidiasis, unspecified: Secondary | ICD-10-CM | POA: Insufficient documentation

## 2023-03-23 DIAGNOSIS — E039 Hypothyroidism, unspecified: Secondary | ICD-10-CM | POA: Diagnosis not present

## 2023-03-23 DIAGNOSIS — J209 Acute bronchitis, unspecified: Secondary | ICD-10-CM | POA: Insufficient documentation

## 2023-03-23 MED ORDER — WEGOVY 0.25 MG/0.5ML ~~LOC~~ SOAJ
0.2500 mg | SUBCUTANEOUS | 1 refills | Status: DC
Start: 2023-03-23 — End: 2023-05-24

## 2023-03-23 NOTE — Assessment & Plan Note (Signed)
May take diflucan 150 mg if yeast infection develops.

## 2023-03-23 NOTE — Assessment & Plan Note (Signed)
Start z-pack.  -take as directed for 5 days  -Rest and increase fluids. Continue using OTC medication to control symptoms.   -add medrol taper. Take as directed for 6 days

## 2023-03-28 NOTE — Assessment & Plan Note (Signed)
Class III obesity along with hypertension, hypothyroid, and chronic pain. Start Wegovy 0.25 mg weekly. Discussed lowering calorie intake to 1500 calories per day and incorporating exercise into daily routine to help lose weight.  Increase Wegovy dose to 0.5 mg weekly after the first 3 weeks if tolerated well. Reassess in 2 months.

## 2023-03-28 NOTE — Assessment & Plan Note (Signed)
Stable.  Continue current medication

## 2023-04-01 ENCOUNTER — Other Ambulatory Visit: Payer: Self-pay

## 2023-04-01 DIAGNOSIS — E039 Hypothyroidism, unspecified: Secondary | ICD-10-CM

## 2023-04-01 MED ORDER — LEVOTHYROXINE SODIUM 50 MCG PO TABS
50.0000 ug | ORAL_TABLET | Freq: Every day | ORAL | 1 refills | Status: DC
Start: 2023-04-01 — End: 2023-05-24

## 2023-04-05 ENCOUNTER — Other Ambulatory Visit: Payer: Self-pay

## 2023-04-05 DIAGNOSIS — E7800 Pure hypercholesterolemia, unspecified: Secondary | ICD-10-CM

## 2023-04-05 MED ORDER — ATORVASTATIN CALCIUM 10 MG PO TABS
ORAL_TABLET | ORAL | 1 refills | Status: DC
Start: 2023-04-05 — End: 2023-05-24

## 2023-04-25 DIAGNOSIS — M533 Sacrococcygeal disorders, not elsewhere classified: Secondary | ICD-10-CM | POA: Diagnosis not present

## 2023-04-25 DIAGNOSIS — M461 Sacroiliitis, not elsewhere classified: Secondary | ICD-10-CM | POA: Diagnosis not present

## 2023-04-25 DIAGNOSIS — M170 Bilateral primary osteoarthritis of knee: Secondary | ICD-10-CM | POA: Diagnosis not present

## 2023-04-25 DIAGNOSIS — G5720 Lesion of femoral nerve, unspecified lower limb: Secondary | ICD-10-CM | POA: Diagnosis not present

## 2023-04-25 DIAGNOSIS — M25559 Pain in unspecified hip: Secondary | ICD-10-CM | POA: Diagnosis not present

## 2023-04-25 DIAGNOSIS — M5136 Other intervertebral disc degeneration, lumbar region: Secondary | ICD-10-CM | POA: Diagnosis not present

## 2023-04-25 DIAGNOSIS — M47812 Spondylosis without myelopathy or radiculopathy, cervical region: Secondary | ICD-10-CM | POA: Diagnosis not present

## 2023-04-25 DIAGNOSIS — M7918 Myalgia, other site: Secondary | ICD-10-CM | POA: Diagnosis not present

## 2023-04-25 DIAGNOSIS — M961 Postlaminectomy syndrome, not elsewhere classified: Secondary | ICD-10-CM | POA: Diagnosis not present

## 2023-04-25 DIAGNOSIS — G894 Chronic pain syndrome: Secondary | ICD-10-CM | POA: Diagnosis not present

## 2023-04-25 DIAGNOSIS — M5030 Other cervical disc degeneration, unspecified cervical region: Secondary | ICD-10-CM | POA: Diagnosis not present

## 2023-04-25 DIAGNOSIS — Z79891 Long term (current) use of opiate analgesic: Secondary | ICD-10-CM | POA: Diagnosis not present

## 2023-04-25 DIAGNOSIS — M47816 Spondylosis without myelopathy or radiculopathy, lumbar region: Secondary | ICD-10-CM | POA: Diagnosis not present

## 2023-04-25 DIAGNOSIS — M545 Low back pain, unspecified: Secondary | ICD-10-CM | POA: Diagnosis not present

## 2023-05-24 ENCOUNTER — Encounter: Payer: Self-pay | Admitting: Family Medicine

## 2023-05-24 ENCOUNTER — Ambulatory Visit (INDEPENDENT_AMBULATORY_CARE_PROVIDER_SITE_OTHER): Payer: Federal, State, Local not specified - PPO | Admitting: Family Medicine

## 2023-05-24 VITALS — BP 135/83 | HR 76 | Resp 18 | Ht 65.0 in | Wt 278.0 lb

## 2023-05-24 DIAGNOSIS — E039 Hypothyroidism, unspecified: Secondary | ICD-10-CM

## 2023-05-24 DIAGNOSIS — E7800 Pure hypercholesterolemia, unspecified: Secondary | ICD-10-CM

## 2023-05-24 DIAGNOSIS — Z6841 Body Mass Index (BMI) 40.0 and over, adult: Secondary | ICD-10-CM

## 2023-05-24 MED ORDER — WEGOVY 0.5 MG/0.5ML ~~LOC~~ SOAJ
0.5000 mg | SUBCUTANEOUS | 0 refills | Status: DC
Start: 2023-05-24 — End: 2023-08-24
  Filled 2023-06-09: qty 2, 28d supply, fill #0
  Filled 2023-07-07: qty 2, 28d supply, fill #1
  Filled 2023-08-02: qty 2, 28d supply, fill #2

## 2023-05-24 MED ORDER — LEVOTHYROXINE SODIUM 50 MCG PO TABS
50.0000 ug | ORAL_TABLET | Freq: Every day | ORAL | 1 refills | Status: DC
Start: 2023-05-24 — End: 2023-08-10

## 2023-05-24 MED ORDER — ATORVASTATIN CALCIUM 10 MG PO TABS
ORAL_TABLET | ORAL | 1 refills | Status: DC
Start: 2023-05-24 — End: 2023-08-24

## 2023-05-24 NOTE — Progress Notes (Signed)
   Established Patient Office Visit  Subjective   Patient ID: Pamela Esparza, female    DOB: Oct 28, 1965  Age: 57 y.o. MRN: [id number]  Chief Complaint  Patient presents with   Weight Management Screening    HPI Pamela Esparza is a 57 y.o. female presenting today for follow up of weight management. Weight management: Weight has decreased 11 lbs since last visit. They have been following a low calorie diet prioritizing protein. Exercise routine is limited by chronic pain and fibromyalgia.  She started [facility] about 2 months ago and is tolerating it well so far. She has had a history of irregular bowel habits, but she has noticed a general increase in constipation since starting the [employer].   ROS Negative unless otherwise noted in HPI   Objective:     BP 135/83   Pulse 76   Resp 18   Ht 5\' 5"  (1.651 m)   Wt 278 lb (126.1 kg)   SpO2 99%   BMI 46.26 kg/m   Physical Exam Constitutional:      General: She is not in acute distress.    Appearance: Normal appearance.  HENT:     Head: Normocephalic and atraumatic.  Cardiovascular:     Rate and Rhythm: Normal rate and regular rhythm.     Heart sounds: No murmur heard.    No friction rub. No gallop.  Pulmonary:     Effort: Pulmonary effort is normal. No respiratory distress.     Breath sounds: No wheezing, rhonchi or rales.  Skin:    General: Skin is warm and dry.  Neurological:     Mental Status: She is alert and oriented to person, place, and time.     Assessment & Plan:  Class 3 severe obesity due to excess calories with serious comorbidity and body mass index (BMI) of 45.0 to 49.9 in adult [facility]) Assessment & Plan: Starting weight: 289 lbs (BMI 48.23) with hypertension, hypothyroid, and chronic pain. Tolerating starting dose of Wegovy well, increase to Wegovy 0.5 mg weekly.  Discussed using MiraLAX for any constipation.  Continue low calorie diet prioritizing protein and incorporating exercise as much as tolerated.  Will  continue to monitor.  Orders: -     Wegovy; Inject 0.5 mg into the skin once a week.  Dispense: 6 mL; Refill: 0  Hypothyroidism, unspecified type -     Levothyroxine Sodium; Take 1 tablet (50 mcg total) by mouth daily before breakfast.  Dispense: 90 tablet; Refill: 1  Pure hypercholesterolemia -     Atorvastatin Calcium; TAKE 1 TABLET(10 MG) BY MOUTH DAILY  Dispense: 60 tablet; Refill: 1    Return in about 3 months (around 08/24/2023) for annual physical, fasting blood work 1 week before.    Melida Quitter, PA

## 2023-05-24 NOTE — Assessment & Plan Note (Signed)
Starting weight: 289 lbs (BMI 48.23) with hypertension, hypothyroid, and chronic pain. Tolerating starting dose of Wegovy well, increase to Wegovy 0.5 mg weekly.  Discussed using MiraLAX for any constipation.  Continue low calorie diet prioritizing protein and incorporating exercise as much as tolerated.  Will continue to monitor.

## 2023-05-24 NOTE — Patient Instructions (Addendum)
Let me know if you have any issues getting Wegovy and I can send it to one of the [facility] pharmacies that I mentioned.  You may take MiraLAX once every day or every other day to treat and prevent constipation.  Keep up the great work!  GENERAL GOALS: -80-90 g protein daily -25 g fiber daily -64 oz fluid intake daily

## 2023-06-09 ENCOUNTER — Other Ambulatory Visit: Payer: Self-pay

## 2023-06-09 ENCOUNTER — Other Ambulatory Visit (HOSPITAL_COMMUNITY): Payer: Self-pay

## 2023-06-13 ENCOUNTER — Other Ambulatory Visit: Payer: Self-pay

## 2023-06-13 DIAGNOSIS — G629 Polyneuropathy, unspecified: Secondary | ICD-10-CM

## 2023-06-13 MED ORDER — GABAPENTIN 600 MG PO TABS
ORAL_TABLET | ORAL | 0 refills | Status: DC
Start: 2023-06-13 — End: 2023-08-24

## 2023-06-16 DIAGNOSIS — M3501 Sicca syndrome with keratoconjunctivitis: Secondary | ICD-10-CM | POA: Diagnosis not present

## 2023-06-16 DIAGNOSIS — M3500 Sicca syndrome, unspecified: Secondary | ICD-10-CM | POA: Diagnosis not present

## 2023-06-16 DIAGNOSIS — M7989 Other specified soft tissue disorders: Secondary | ICD-10-CM | POA: Diagnosis not present

## 2023-06-16 DIAGNOSIS — M1991 Primary osteoarthritis, unspecified site: Secondary | ICD-10-CM | POA: Diagnosis not present

## 2023-06-16 DIAGNOSIS — R21 Rash and other nonspecific skin eruption: Secondary | ICD-10-CM | POA: Diagnosis not present

## 2023-06-16 DIAGNOSIS — M797 Fibromyalgia: Secondary | ICD-10-CM | POA: Diagnosis not present

## 2023-06-16 DIAGNOSIS — Z6841 Body Mass Index (BMI) 40.0 and over, adult: Secondary | ICD-10-CM | POA: Diagnosis not present

## 2023-07-13 ENCOUNTER — Other Ambulatory Visit: Payer: Self-pay | Admitting: Family Medicine

## 2023-07-13 ENCOUNTER — Encounter: Payer: Self-pay | Admitting: Family Medicine

## 2023-07-13 ENCOUNTER — Telehealth: Payer: Self-pay

## 2023-07-13 DIAGNOSIS — Z1231 Encounter for screening mammogram for malignant neoplasm of breast: Secondary | ICD-10-CM

## 2023-07-13 NOTE — Telephone Encounter (Signed)
Patient called in wanting to schedule an appointment I did informed her she is a patient of [facility] and I transfer to them.

## 2023-07-14 ENCOUNTER — Other Ambulatory Visit: Payer: Self-pay | Admitting: Neurosurgery

## 2023-07-14 DIAGNOSIS — D352 Benign neoplasm of pituitary gland: Secondary | ICD-10-CM

## 2023-07-21 DIAGNOSIS — M461 Sacroiliitis, not elsewhere classified: Secondary | ICD-10-CM | POA: Diagnosis not present

## 2023-07-21 DIAGNOSIS — M5030 Other cervical disc degeneration, unspecified cervical region: Secondary | ICD-10-CM | POA: Diagnosis not present

## 2023-07-21 DIAGNOSIS — G5720 Lesion of femoral nerve, unspecified lower limb: Secondary | ICD-10-CM | POA: Diagnosis not present

## 2023-07-21 DIAGNOSIS — M533 Sacrococcygeal disorders, not elsewhere classified: Secondary | ICD-10-CM | POA: Diagnosis not present

## 2023-07-21 DIAGNOSIS — M25561 Pain in right knee: Secondary | ICD-10-CM | POA: Diagnosis not present

## 2023-07-21 DIAGNOSIS — Z79891 Long term (current) use of opiate analgesic: Secondary | ICD-10-CM | POA: Diagnosis not present

## 2023-07-21 DIAGNOSIS — M170 Bilateral primary osteoarthritis of knee: Secondary | ICD-10-CM | POA: Diagnosis not present

## 2023-07-21 DIAGNOSIS — M25562 Pain in left knee: Secondary | ICD-10-CM | POA: Diagnosis not present

## 2023-07-21 DIAGNOSIS — M4802 Spinal stenosis, cervical region: Secondary | ICD-10-CM | POA: Diagnosis not present

## 2023-07-21 DIAGNOSIS — M51369 Other intervertebral disc degeneration, lumbar region without mention of lumbar back pain or lower extremity pain: Secondary | ICD-10-CM | POA: Diagnosis not present

## 2023-07-21 DIAGNOSIS — G894 Chronic pain syndrome: Secondary | ICD-10-CM | POA: Diagnosis not present

## 2023-07-21 DIAGNOSIS — M7918 Myalgia, other site: Secondary | ICD-10-CM | POA: Diagnosis not present

## 2023-07-21 DIAGNOSIS — M47812 Spondylosis without myelopathy or radiculopathy, cervical region: Secondary | ICD-10-CM | POA: Diagnosis not present

## 2023-07-21 DIAGNOSIS — M47816 Spondylosis without myelopathy or radiculopathy, lumbar region: Secondary | ICD-10-CM | POA: Diagnosis not present

## 2023-08-03 ENCOUNTER — Telehealth: Payer: Self-pay

## 2023-08-03 ENCOUNTER — Ambulatory Visit (INDEPENDENT_AMBULATORY_CARE_PROVIDER_SITE_OTHER): Payer: Federal, State, Local not specified - PPO | Admitting: Family Medicine

## 2023-08-03 ENCOUNTER — Encounter: Payer: Self-pay | Admitting: Family Medicine

## 2023-08-03 VITALS — BP 138/80 | HR 62 | Temp 97.4°F | Ht 65.0 in | Wt 264.0 lb

## 2023-08-03 DIAGNOSIS — M25561 Pain in right knee: Secondary | ICD-10-CM | POA: Diagnosis not present

## 2023-08-03 DIAGNOSIS — B9689 Other specified bacterial agents as the cause of diseases classified elsewhere: Secondary | ICD-10-CM | POA: Diagnosis not present

## 2023-08-03 DIAGNOSIS — J019 Acute sinusitis, unspecified: Secondary | ICD-10-CM | POA: Diagnosis not present

## 2023-08-03 MED ORDER — AMOXICILLIN 500 MG PO CAPS: 500.0000 mg | ORAL_CAPSULE | Freq: Three times a day (TID) | ORAL | 0 refills | Status: AC

## 2023-08-03 MED ORDER — NYSTATIN 100000 UNIT/GM EX POWD: Freq: Two times a day (BID) | CUTANEOUS | 2 refills | Status: AC

## 2023-08-03 NOTE — Patient Instructions (Addendum)
I have sent the orders for the x-ray to [facility] imaging.  They accept walk-in patients, so go there whenever is convenient for you and they will have the orders ready.

## 2023-08-03 NOTE — Telephone Encounter (Signed)
Pt is requesting a Diflucan for yeast infection since she will be starting Abx.

## 2023-08-03 NOTE — Telephone Encounter (Signed)
I do not routinely send in Diflucan if someone is not currently having symptoms.  She can definitely let me know if she does get a yeast infection though and I can send it in if needed then!

## 2023-08-03 NOTE — Progress Notes (Signed)
Acute Office Visit  Subjective:     Patient ID: Pamela Esparza, female    DOB: March 26, 1966, 57 y.o.   MRN: [id number]  Chief Complaint  Patient presents with   Sinus Problem   Leg Pain    rt    HPI Patient is in today for possible bronchitis, sinusitis, and right leg pain.  She has been dealing with sinus and lung issues for about 1 month.  She typically gets worsening allergies in the fall which triggers sinus infections and bronchitis.  She does take Claritin-D daily, and since symptoms started 1 month ago has been taking Mucinex and using Flonase nasal spray with no improvement.  She complains of chest tightness, ear pain, headache, head pressure.  Denies fever, nausea/vomiting, chills, wheezing.  She also complains of acute right knee pain that started about 2-1/2 weeks ago when her dog stepped on her right knee.  She felt her kneecap moved to the side and has had pain with weightbearing ever since.  It seems to improve with rest but returns when she does start walking again.  ROS Negative unless otherwise noted in HPI    Objective:    BP 138/80   Pulse 62   Temp (!) 97.4 F (36.3 C) (Oral)   Ht 5\' 5"  (1.651 m)   Wt 264 lb (119.7 kg)   SpO2 94%   BMI 43.93 kg/m   Physical Exam Constitutional:      General: She is not in acute distress.    Appearance: Normal appearance. She is not ill-appearing.  HENT:     Head: Normocephalic and atraumatic.     Right Ear: Tympanic membrane, ear canal and external ear normal. There is no impacted cerumen.     Left Ear: Tympanic membrane, ear canal and external ear normal. There is no impacted cerumen.     Ears:     Comments: No signs of otitis media    Nose: No congestion or rhinorrhea.     Right Sinus: Maxillary sinus tenderness (maxillary>frontal) and frontal sinus tenderness present.     Left Sinus: Maxillary sinus tenderness (maxillary>frontal) and frontal sinus tenderness present.     Mouth/Throat:     Mouth: Mucous membranes  are moist.     Pharynx: Oropharynx is clear. No oropharyngeal exudate or posterior oropharyngeal erythema.     Comments: Cobblestoning consistent with postnasal drip Eyes:     General:        Right eye: No discharge.        Left eye: No discharge.     Conjunctiva/sclera: Conjunctivae normal.     Pupils: Pupils are equal, round, and reactive to light.  Cardiovascular:     Rate and Rhythm: Normal rate and regular rhythm.     Heart sounds: No murmur heard.    No friction rub. No gallop.  Pulmonary:     Effort: Pulmonary effort is normal. No respiratory distress.     Breath sounds: Normal breath sounds. No wheezing, rhonchi or rales.     Comments: CTA bilaterally, no adventitious breath sounds. Skin:    General: Skin is warm and dry.  Neurological:     Mental Status: She is alert and oriented to person, place, and time.      Assessment & Plan:  Acute bacterial sinusitis -     Amoxicillin; Take 1 capsule (500 mg total) by mouth 3 (three) times daily for 10 days.  Dispense: 30 capsule; Refill: 0  Acute pain of right knee -  DG Knee 1-2 Views Right  Other orders -     Nystatin; Apply topically 2 (two) times daily.  Dispense: 15 g; Refill: 2  Vital signs within normal limits, no evidence on physical exam of possible pneumonia.  Start course of amoxicillin for acute bacterial sinusitis now that symptoms have persisted for 4 weeks.  Encouraged to continue with over-the-counter remedies like Mucinex and Flonase for symptomatic relief.  Ordering x-ray of right knee to assess for bony abnormalities.  Encourage patient to keep her upcoming appointment with orthopedics for further evaluation as well, suspect symptoms may be related to described patellar subluxation.  Return if symptoms worsen or fail to improve.  Melida Quitter, PA

## 2023-08-05 ENCOUNTER — Ambulatory Visit: Payer: Federal, State, Local not specified - PPO

## 2023-08-09 ENCOUNTER — Ambulatory Visit (INDEPENDENT_AMBULATORY_CARE_PROVIDER_SITE_OTHER): Payer: Federal, State, Local not specified - PPO | Admitting: Orthopedic Surgery

## 2023-08-09 ENCOUNTER — Encounter: Payer: Self-pay | Admitting: Orthopedic Surgery

## 2023-08-09 ENCOUNTER — Ambulatory Visit (INDEPENDENT_AMBULATORY_CARE_PROVIDER_SITE_OTHER): Payer: Federal, State, Local not specified - PPO | Admitting: Internal Medicine

## 2023-08-09 ENCOUNTER — Other Ambulatory Visit (INDEPENDENT_AMBULATORY_CARE_PROVIDER_SITE_OTHER): Payer: Self-pay

## 2023-08-09 ENCOUNTER — Encounter: Payer: Self-pay | Admitting: Internal Medicine

## 2023-08-09 VITALS — BP 118/74 | HR 62 | Ht 65.0 in | Wt 266.0 lb

## 2023-08-09 DIAGNOSIS — S83001A Unspecified subluxation of right patella, initial encounter: Secondary | ICD-10-CM | POA: Diagnosis not present

## 2023-08-09 DIAGNOSIS — M25561 Pain in right knee: Secondary | ICD-10-CM | POA: Diagnosis not present

## 2023-08-09 DIAGNOSIS — D352 Benign neoplasm of pituitary gland: Secondary | ICD-10-CM | POA: Diagnosis not present

## 2023-08-09 DIAGNOSIS — E039 Hypothyroidism, unspecified: Secondary | ICD-10-CM | POA: Diagnosis not present

## 2023-08-09 LAB — BASIC METABOLIC PANEL
BUN: 17 mg/dL (ref 6–23)
CO2: 30 meq/L (ref 19–32)
Calcium: 10 mg/dL (ref 8.4–10.5)
Chloride: 103 meq/L (ref 96–112)
Creatinine, Ser: 0.71 mg/dL (ref 0.40–1.20)
GFR: 93.43 mL/min (ref >60.00)
Glucose, Bld: 95 mg/dL (ref 70–99)
Potassium: 4.5 meq/L (ref 3.5–5.1)
Sodium: 142 meq/L (ref 135–145)

## 2023-08-09 LAB — T4, FREE: Free T4: 0.79 ng/dL (ref 0.60–1.60)

## 2023-08-09 LAB — TSH: TSH: 3.11 u[IU]/mL (ref 0.35–5.50)

## 2023-08-09 MED ORDER — METHYLPREDNISOLONE ACETATE 40 MG/ML IJ SUSP
40.0000 mg | INTRAMUSCULAR | Status: AC | PRN
  Administered 2023-08-09: 40 mg via INTRA_ARTICULAR

## 2023-08-09 MED ORDER — LIDOCAINE HCL (PF) 1 % IJ SOLN
5.0000 mL | INTRAMUSCULAR | Status: AC | PRN
  Administered 2023-08-09: 5 mL

## 2023-08-09 NOTE — Progress Notes (Signed)
Office Visit Note   Patient: Pamela Esparza           Date of Birth: 1966/04/20           MRN: [id number] Visit Date: 08/09/2023              Requested by: Melida Quitter, PA [address] [place],  [place] [address] PCP: Melida Quitter, PA  Chief Complaint  Patient presents with   Right Knee - Pain      HPI: Patient is a 57 year old woman who is seen for initial evaluation for right knee pain injury occurred 2 to 3 weeks ago.  Patient states that a 75 pound dog ran into her knee while she was sitting she states that it felt like her patella shifted laterally.  She states she has had a steroid injection laterally but this did not provide her relief.  Assessment & Plan: Visit Diagnoses:  1. Acute pain of right knee   2. Subluxation of right patella, initial encounter     Plan: Patient's knee was injected from the anterior medial portal.  She will continue using the cane.  Discussed that she is symptomatic over the medial meniscus and the medial patellofemoral ligament.  May require an MRI scan.  Follow-Up Instructions: Return in about 4 weeks (around 09/06/2023).   Ortho Exam  Patient is alert, oriented, no adenopathy, well-dressed, normal affect, normal respiratory effort. Examination there is no effusion no ecchymosis or bruising.  The patella is midline.  With range of motion the patella tracks midline and there is no crepitation with range of motion.  Patient is maximally tender to palpation over the medial meniscus and is tender to palpation over the medial patellofemoral ligament.  Collateral ligaments are stable anterior drawer is stable.  Imaging: XR Knee 1-2 Views Right  Result Date: 08/09/2023 2 view radiographs of the right knee shows no bony abnormalities no fractures no avulsions.  The alignment of the patella is symmetric bilaterally.  No images are attached to the encounter.  Labs: Lab Results  Component Value Date   HGBA1C 5.6 12/20/2022    HGBA1C 5.6 04/21/2022   HGBA1C 5.9 01/04/2022     Lab Results  Component Value Date   ALBUMIN 4.2 12/20/2022   ALBUMIN 4.6 04/21/2022   ALBUMIN 4.4 04/16/2021    No results found for: "MG" Lab Results  Component Value Date   VD25OH 50.25 09/14/2021   VD25OH 51.3 02/11/2021   VD25OH 38.3 08/14/2019    No results found for: "PREALBUMIN"    Latest Ref Rng & Units 12/20/2022    8:56 AM 04/21/2022    9:03 AM 04/16/2021    9:02 AM  CBC EXTENDED  WBC 3.4 - 10.8 x10E3/uL 6.7  8.9  7.1   RBC 3.77 - 5.28 x10E6/uL 4.51  4.65  4.41   Hemoglobin 11.1 - 15.9 g/dL 01.0  93.2  35.5   HCT 34.0 - 46.6 % 42.6  43.2  40.3   Platelets 150 - 450 x10E3/uL 286  326  282   NEUT# 1.4 - 7.0 x10E3/uL  5.8  4.3   Lymph# 0.7 - 3.1 x10E3/uL  2.6  2.2      There is no height or weight on file to calculate BMI.  Orders:  Orders Placed This Encounter  Procedures   XR Knee 1-2 Views Right   No orders of the defined types were placed in this encounter.    Procedures: Large Joint  Inj: R knee on 08/09/2023 1:51 PM Indications: pain and diagnostic evaluation Details: 22 G 1.5 in needle, anteromedial approach  Arthrogram: No  Medications: 5 mL lidocaine (PF) 1 %; 40 mg methylPREDNISolone acetate 40 MG/ML Outcome: tolerated well, no immediate complications Procedure, treatment alternatives, risks and benefits explained, specific risks discussed. Consent was given by the patient. Immediately prior to procedure a time out was called to verify the correct patient, procedure, equipment, support staff and site/side marked as required. Patient was prepped and draped in the usual sterile fashion.      Clinical Data: No additional findings.  ROS:  All other systems negative, except as noted in the HPI. Review of Systems  Objective: Vital Signs: There were no vitals taken for this visit.  Specialty Comments:  No specialty comments available.  PMFS History: Patient Active Problem List    Diagnosis Date Noted   Polyp of colon 01/27/2023   Hyperlipidemia 04/28/2022   Pituitary microadenoma (HCC) 09/14/2021   Primary osteoarthritis 09/03/2021   Sjogren's disease (HCC) 04/16/2021   Spondylosis of lumbar region without myelopathy or radiculopathy 02/04/2021   Essential (primary) hypertension 02/02/2021   Class 3 severe obesity due to excess calories with serious comorbidity and body mass index (BMI) of 45.0 to 49.9 in adult (HCC) 02/02/2021   Chronic pain syndrome 01/12/2021   Peroneal tendinitis, right leg    Multiple atypical nevi 08/14/2019   Haglund's deformity of right heel 06/25/2019   Hypothyroidism 03/21/2019   Chronic joint pain 10/23/2018   DDD (degenerative disc disease), lumbar 10/23/2018   Muscle pain, fibromyalgia 10/23/2018   Lumbosacral spondylosis without myelopathy 08/20/2015   Neuropathy 07/11/2013   Past Medical History:  Diagnosis Date   Anxiety    Arthritis    Asthma    Cataract    DDD (degenerative disc disease), lumbar    Depression    Depression    [employer] 10/13/2020   Family history of bladder cancer    Family history of brain cancer 02/15/2023   Fibromyalgia    Hyperlipidemia    Joint pain    Neuromuscular disorder (HCC)    [employer] 10/13/2020   Neuropathy    Osteoporosis    Sjogren's disease (HCC)    Thyroid disease     Family History  Problem Relation Age of Onset   Breast cancer Mother 53       second cancer at 37   Lung cancer Mother 24   Heart attack Father    Hypertension Father    Diabetes Father    Bladder Cancer Father 80   Lung cancer Maternal Aunt    Brain cancer Paternal Uncle    Thyroid disease Paternal Uncle    Birth defects Maternal Grandmother        ovarian   Depression Maternal Grandmother    Uterine cancer Maternal Grandmother        d. 45s   Brain cancer Paternal Grandmother    Colon cancer Neg Hx    Colon polyps Neg Hx    Esophageal cancer Neg Hx    Stomach cancer Neg Hx    Rectal cancer Neg  Hx     Past Surgical History:  Procedure Laterality Date   ANKLE SURGERY Right    APPENDECTOMY     CHOLECYSTECTOMY     ESOPHAGOGASTRODUODENOSCOPY (EGD) WITH PROPOFOL N/A 12/05/2020   Procedure: ESOPHAGOGASTRODUODENOSCOPY (EGD) WITH PROPOFOL;  Surgeon: Pasty Spillers, MD;  Location: [facility];  Service: Endoscopy;  Laterality: N/A;   finger sugery  NECK SURGERY     TENDON TRANSFER Right 11/06/2019   Procedure: PERONEAL RECONSTRUCTION WITH PERONEAL LONGUS RIGHT LEG;  Surgeon: Nadara Mustard, MD;  Location: [facility];  Service: Orthopedics;  Laterality: Right;   TOTAL ABDOMINAL HYSTERECTOMY     TUBAL LIGATION N/A    [employer] 10/13/2020   Social History   Occupational History   Not on file  Tobacco Use   Smoking status: Never    Passive exposure: Never   Smokeless tobacco: Never  Vaping Use   Vaping status: Never Used  Substance and Sexual Activity   Alcohol use: Yes    Comment: 4 drinks a year-rare   Drug use: Never   Sexual activity: Not Currently

## 2023-08-09 NOTE — Progress Notes (Unsigned)
Name: Pamela Esparza  MRN/ DOB: [id number], 1965-11-16    Age/ Sex: 57 y.o., female    PCP: Melida Quitter, PA   Reason for Endocrinology Evaluation: Pituitary microadenoma     Date of Initial Endocrinology Evaluation: 09/09/2021    HPI: [name] is a 57 y.o. female with a past medical history of hypothyroid, Sjogren's syndrome, and HTN. The patient presented for initial endocrinology clinic visit on 09/09/2021 for consultative assistance with her pituitary microadenoma.   During evaluation for hearing loss through ENT the patient was noted to have 6 mm pituitary adenoma on MRI dated 07/02/2021  Hormonal testing was normal including IGF-1, 24-hour urinary cortisol 16.7 mcg, TFTs, prolactin and ACTH with cortisol (08/2021)   She was diagnosed with  hypothyroidism ~2020 . Has been on Lt-4 replacement since then   SUBJECTIVE:    Today (08/09/23): Pamela Esparza is here for follow-up on pituitary microadenoma, prediabetes and hypothyroidism .  The patient follows with [facility], she receives lumbar radiofrequency ablation She follows with [facility] rheumatology for fibromyalgia, Jorgen syndrome, and osteoarthritis  She continues to follow-up with weight management clinic, on [facility] Pt has been noted with weight loss  Has chronic nausea but no vomiting  Continues with  alternating bowel movement  Denies  Palpitations  Has LE edema - improving  No change in ring size or shoe size  No recent  hand swelling  She occasional headaches due to sinus infection recently  Has noted worsening vision, has to find another ophthalmology   Has received intra-articular knee last month   She has been avoiding sugar-sweetened beverages   Levothyroxine 50 mcg daily   HISTORY:  Past Medical History:  Past Medical History:  Diagnosis Date   Anxiety    Arthritis    Asthma    Cataract    DDD (degenerative disc disease), lumbar    Depression     Depression    Phreesia 10/13/2020   Family history of bladder cancer    Family history of brain cancer 02/15/2023   Fibromyalgia    Hyperlipidemia    Joint pain    Neuromuscular disorder (HCC)    Phreesia 10/13/2020   Neuropathy    Osteoporosis    Sjogren's disease (HCC)    Thyroid disease    Past Surgical History:  Past Surgical History:  Procedure Laterality Date   ANKLE SURGERY Right    APPENDECTOMY     CHOLECYSTECTOMY     ESOPHAGOGASTRODUODENOSCOPY (EGD) WITH PROPOFOL N/A 12/05/2020   Procedure: ESOPHAGOGASTRODUODENOSCOPY (EGD) WITH PROPOFOL;  Surgeon: Pasty Spillers, MD;  Location: [facility] ENDOSCOPY;  Service: Endoscopy;  Laterality: N/A;   finger sugery     NECK SURGERY     TENDON TRANSFER Right 11/06/2019   Procedure: PERONEAL RECONSTRUCTION WITH PERONEAL LONGUS RIGHT LEG;  Surgeon: Nadara Mustard, MD;  Location: [facility];  Service: Orthopedics;  Laterality: Right;   TOTAL ABDOMINAL HYSTERECTOMY     TUBAL LIGATION N/A    Phreesia 10/13/2020    Social History:  reports that she has never smoked. She has never been exposed to tobacco smoke. She has never used smokeless tobacco. She reports current alcohol use. She reports that she does not use drugs. Family History: family history includes Birth defects in her maternal grandmother; Bladder Cancer (age of onset: 3) in her father; Brain cancer in her paternal grandmother and paternal uncle; Breast cancer (age of onset: 22) in her mother; Depression in  her maternal grandmother; Diabetes in her father; Heart attack in her father; Hypertension in her father; Lung cancer in her maternal aunt; Lung cancer (age of onset: 55) in her mother; Thyroid disease in her paternal uncle; Uterine cancer in her maternal grandmother.   HOME MEDICATIONS: Allergies as of 08/09/2023       Reactions   Aspirin Other (See Comments)   stomach upset stomach upset   Hydrocodone-acetaminophen Other (See Comments)   made her  sick made her sick   Azithromycin    Patient states it does not work for her   Codeine    Other reaction(s): Upset stomach (finding)   Lyrica [pregabalin]    Solu-medrol [methylprednisolone] Itching   Topiramate         Medication List        Accurate as of August 09, 2023  9:33 AM. If you have any questions, ask your nurse or doctor.          albuterol 108 (90 Base) MCG/ACT inhaler Commonly known as: VENTOLIN HFA INHALE 1 TO 2 PUFFS INTO THE LUNGS EVERY 6 HOURS AS NEEDED   ALPRAZolam 0.5 MG tablet Commonly known as: XANAX Take 0.5 mg by mouth daily as needed.   amoxicillin 500 MG capsule Commonly known as: AMOXIL Take 1 capsule (500 mg total) by mouth 3 (three) times daily for 10 days.   atorvastatin 10 MG tablet Commonly known as: LIPITOR TAKE 1 TABLET(10 MG) BY MOUTH DAILY   CENTRUM SILVER PO Take by mouth.   cyclobenzaprine 5 MG tablet Commonly known as: FLEXERIL TAKE 1 TABLET BY MOUTH AT NIGHT FOR 1 WEEK THEN TAKE 1 TABLET BY MOUTH TWICE DAILY   DULoxetine 60 MG capsule Commonly known as: CYMBALTA Take 120 mg by mouth daily.   fluticasone 50 MCG/ACT nasal spray Commonly known as: FLONASE SHAKE LIQUID AND USE 1 SPRAY IN EACH NOSTRIL DAILY   gabapentin 600 MG tablet Commonly known as: NEURONTIN TAKE 1 TABLET(600 MG) BY MOUTH THREE TIMES DAILY   hydroxychloroquine 200 MG tablet Commonly known as: PLAQUENIL Take 200 mg by mouth 2 (two) times daily.   levothyroxine 50 MCG tablet Commonly known as: SYNTHROID Take 1 tablet (50 mcg total) by mouth daily before breakfast.   meloxicam 15 MG tablet Commonly known as: MOBIC Take 15 mg by mouth.   nitroGLYCERIN 0.4 MG SL tablet Commonly known as: NITROSTAT Place under the tongue.   nystatin powder Commonly known as: MYCOSTATIN/NYSTOP Apply topically 2 (two) times daily.   oxyCODONE-acetaminophen 10-325 MG tablet Commonly known as: PERCOCET Take 1 tablet by mouth 2 (two) times daily.    pilocarpine 5 MG tablet Commonly known as: SALAGEN Take 5 mg by mouth 3 (three) times daily.   Vitamin D3 75 MCG (3000 UT) Tabs Take 5,000 Int'l Units by mouth daily.   Wegovy 0.5 MG/0.5ML Soaj Generic drug: Semaglutide-Weight Management Inject 0.5 mg into the skin once a week.          REVIEW OF SYSTEMS: A comprehensive ROS was conducted with the patient and is negative except as per HPI     OBJECTIVE:  VS: BP 118/74 (BP Location: Left Arm, Patient Position: Sitting, Cuff Size: Large)   Pulse 62   Ht 5\' 5"  (1.651 m)   Wt 266 lb (120.7 kg)   SpO2 99%   BMI 44.26 kg/m    Wt Readings from Last 3 Encounters:  08/09/23 266 lb (120.7 kg)  08/03/23 264 lb (119.7 kg)  05/24/23 278 lb (  126.1 kg)     EXAM: General: Pt appears well and is in NAD  Neck: General: Supple without adenopathy. Thyroid: Thyroid size normal.  No goiter or nodules appreciated.   Lungs: Clear with good BS bilat with no rales, rhonchi, or wheezes  Heart: Auscultation: RRR.  Extremities:  BL LE: No pretibial edema normal ROM and strength.  Mental Status: Judgment, insight: Intact Orientation: Oriented to time, place, and person Mood and affect: No depression, anxiety, or agitation     DATA REVIEWED:   Latest Reference Range & Units 08/09/23 09:58  Sodium 135 - 145 mEq/L 142  Potassium 3.5 - 5.1 mEq/L 4.5  Chloride 96 - 112 mEq/L 103  CO2 19 - 32 mEq/L 30  Glucose 70 - 99 mg/dL 95  BUN 6 - 23 mg/dL 17  Creatinine 2.13 - 0.86 mg/dL 5.78  Calcium 8.4 - 46.9 mg/dL 62.9  GFR >52.84 mL/min 93.43    Latest Reference Range & Units 08/09/23 09:58  Prolactin ng/mL 8.0  Glucose 70 - 99 mg/dL 95  TSH 1.32 - 4.40 uIU/mL 3.11  T4,Free(Direct) 0.60 - 1.60 ng/dL 1.02      MRI 72/53/6644  FINDINGS: Brain: No acute infarct, hemorrhage, or parenchymal mass lesion is present. No significant white matter lesions are present. Deep brain nuclei are within normal limits.   The ventricles are of  normal size. No significant extraaxial fluid collection is present.   The internal auditory canals are within normal limits. The brainstem and cerebellum are within normal limits.   Dedicated imaging of the pituitary demonstrates that the gland is mildly enlarged measuring 8 mm in cephalo caudad dimension. The pituitary stalk is midline. A hypoenhancing lesion in the left lateral aspect of the gland measures 6.5 x 5.0 x 4.0 mm. There is no invasion of the cavernous sinus. The gland is asymmetrically taller on the left.   Vascular: Flow is present in the major intracranial arteries.   Skull and upper cervical spine: The craniocervical junction is normal. Upper cervical spine is within normal limits. Marrow signal is unremarkable.   Sinuses/Orbits: The paranasal sinuses and mastoid air cells are clear. The globes and orbits are within normal limits.   IMPRESSION: 1. 6.5 x 5.0 x 4.0 mm hypoenhancing lesion in the left lateral aspect of the gland compatible with a pituitary microadenoma. 2. Otherwise normal MRI appearance of the brain.       MRI 07/02/2021  FINDINGS: Brain:   Cerebral volume is normal.   Subtle ill-defined T2 FLAIR hyperintense signal abnormality anterior right frontal lobe white matter (series 6, image 17).   6 mm round hypoenhancing focus within the left aspect of the pituitary gland, highly suspicious for a pituitary microadenoma (series 14, image 24).   No cerebellopontine angle or internal auditory canal mass is demonstrated. Unremarkable appearance of the seventh and eighth cranial nerves bilaterally.   There is no acute infarct.   No chronic intracranial blood products.   No extra-axial fluid collection.   No midline shift.   No pathologic intracranial enhancement identified.   Vascular: Maintained flow voids within the proximal large arterial vessels.   Skull and upper cervical spine: No focal suspicious marrow lesion.    Sinuses/Orbits: Visualized orbits show no acute finding. Trace mucosal thickening within the bilateral ethmoid sinuses.   Other: Minimal fluid in the right mastoid air cells.   Impression #2 will be called to the ordering clinician or representative by the Radiologist Assistant, and communication documented in the PACS or  [facility] Dashboard.   IMPRESSION: No cerebellopontine angle or internal auditory canal mass.   6 mm round hypoenhancing focus within the left aspect of the pituitary gland, highly suspicious for a pituitary microadenoma. Correlate with relevant endocrinologic laboratory values. Additionally, a contrast-enhanced pituitary protocol brain MRI may be considered for further evaluation.   Subtle ill-defined T2 FLAIR hyperintense signal abnormality within the anterior right frontal lobe white matter, nonspecific but possibly reflecting minimal changes of chronic small vessel ischemia.   Trace fluid within the right mastoid air cells.    ASSESSMENT/PLAN/RECOMMENDATIONS:   Pituitary microadenoma:  -No local symptoms -Pituitary MRI 08/2022 shows stability of pituitary microadenoma -TFTs, electrolytes, prolactin are within normal range -IGF-1 pending -Unable to proceed with cortisol, ACTH testing as she received any glucocorticoid injection within the past 3 months     2. Hypothyroidism    -Patient is clinically euthyroid -TFTs are within normal range  Medication Continue levothyroxine 50 MCG daily     Follow-up in 1 yr    Signed electronically by: Lyndle Herrlich, MD  [facility]  [facility] [address] [place] [place], [place] [address] Phone: [phone] FAX: [phone]   CC: Melida Quitter, PA [address] [place] [address] Phone: [phone] Fax: [phone]   Return to Endocrinology clinic as below: Future Appointments  Date Time Provider Department Center  08/09/2023   1:15 PM Nadara Mustard, MD [facility] None  08/11/2023  8:30 AM [facility] MM 3 [facility]  08/18/2023  9:00 AM [facility] None  08/24/2023  2:50 PM Melida Quitter, PA [facility] None  09/03/2023 10:20 AM [facility] MR 3 [id number] [facility]

## 2023-08-10 MED ORDER — LEVOTHYROXINE SODIUM 50 MCG PO TABS: 50.0000 ug | ORAL_TABLET | Freq: Every day | ORAL | 3 refills | Status: DC

## 2023-08-11 ENCOUNTER — Ambulatory Visit
Admission: RE | Admit: 2023-08-11 | Discharge: 2023-08-11 | Disposition: A | Discharge status: Home / Self Care | Payer: Federal, State, Local not specified - PPO | Source: Ambulatory Visit | Attending: Family Medicine | Admitting: Family Medicine

## 2023-08-11 DIAGNOSIS — Z1231 Encounter for screening mammogram for malignant neoplasm of breast: Secondary | ICD-10-CM

## 2023-08-15 LAB — INSULIN-LIKE GROWTH FACTOR
IGF-I, LC/MS: 84 ng/mL (ref 50–317)
Z-Score (Female): -0.9 {STDV} (ref ?–2.0)

## 2023-08-15 LAB — PROLACTIN: Prolactin: 8 ng/mL

## 2023-08-18 ENCOUNTER — Other Ambulatory Visit: Payer: Federal, State, Local not specified - PPO

## 2023-08-18 DIAGNOSIS — E7800 Pure hypercholesterolemia, unspecified: Secondary | ICD-10-CM

## 2023-08-18 DIAGNOSIS — E039 Hypothyroidism, unspecified: Secondary | ICD-10-CM | POA: Diagnosis not present

## 2023-08-18 DIAGNOSIS — E66813 Obesity, class 3: Secondary | ICD-10-CM

## 2023-08-18 DIAGNOSIS — G3184 Mild cognitive impairment, so stated: Secondary | ICD-10-CM | POA: Diagnosis not present

## 2023-08-18 DIAGNOSIS — Z6841 Body Mass Index (BMI) 40.0 and over, adult: Secondary | ICD-10-CM | POA: Diagnosis not present

## 2023-08-18 DIAGNOSIS — M797 Fibromyalgia: Secondary | ICD-10-CM | POA: Diagnosis not present

## 2023-08-19 LAB — LIPID PANEL
Chol/HDL Ratio: 2.1 ratio (ref 0.0–4.4)
Cholesterol, Total: 157 mg/dL (ref 100–199)
HDL: 75 mg/dL (ref >39)
LDL Chol Calc (NIH): 70 mg/dL (ref 0–99)
Triglycerides: 62 mg/dL (ref 0–149)
VLDL Cholesterol Cal: 12 mg/dL (ref 5–40)

## 2023-08-19 LAB — COMPREHENSIVE METABOLIC PANEL
ALT: 26 [IU]/L (ref 0–32)
AST: 20 [IU]/L (ref 0–40)
Albumin: 4.3 g/dL (ref 3.8–4.9)
Alkaline Phosphatase: 113 [IU]/L (ref 44–121)
BUN/Creatinine Ratio: 23 (ref 9–23)
BUN: 17 mg/dL (ref 6–24)
Bilirubin Total: 0.6 mg/dL (ref 0.0–1.2)
CO2: 26 mmol/L (ref 20–29)
Calcium: 9.8 mg/dL (ref 8.7–10.2)
Chloride: 105 mmol/L (ref 96–106)
Creatinine, Ser: 0.74 mg/dL (ref 0.57–1.00)
Globulin, Total: 2.5 g/dL (ref 1.5–4.5)
Glucose: 91 mg/dL (ref 70–99)
Potassium: 4.9 mmol/L (ref 3.5–5.2)
Sodium: 145 mmol/L — ABNORMAL HIGH (ref 134–144)
Total Protein: 6.8 g/dL (ref 6.0–8.5)
eGFR: 94 mL/min/{1.73_m2} (ref >59)

## 2023-08-19 LAB — CBC WITH DIFFERENTIAL/PLATELET
Basophils Absolute: 0.1 10*3/uL (ref 0.0–0.2)
Basos: 1 %
EOS (ABSOLUTE): 0.2 10*3/uL (ref 0.0–0.4)
Eos: 2 %
Hematocrit: 43.8 % (ref 34.0–46.6)
Hemoglobin: 14.5 g/dL (ref 11.1–15.9)
Immature Grans (Abs): 0.1 10*3/uL (ref 0.0–0.1)
Immature Granulocytes: 1 %
Lymphocytes Absolute: 2.6 10*3/uL (ref 0.7–3.1)
Lymphs: 34 %
MCH: 31.9 pg (ref 26.6–33.0)
MCHC: 33.1 g/dL (ref 31.5–35.7)
MCV: 96 fL (ref 79–97)
Monocytes Absolute: 0.5 10*3/uL (ref 0.1–0.9)
Monocytes: 6 %
Neutrophils Absolute: 4.2 10*3/uL (ref 1.4–7.0)
Neutrophils: 56 %
Platelets: 270 10*3/uL (ref 150–450)
RBC: 4.55 x10E6/uL (ref 3.77–5.28)
RDW: 13.1 % (ref 11.7–15.4)
WBC: 7.6 10*3/uL (ref 3.4–10.8)

## 2023-08-19 LAB — VITAMIN D 25 HYDROXY (VIT D DEFICIENCY, FRACTURES): Vit D, 25-Hydroxy: 65.4 ng/mL (ref 30.0–100.0)

## 2023-08-19 LAB — TSH RFX ON ABNORMAL TO FREE T4: TSH: 2.6 u[IU]/mL (ref 0.450–4.500)

## 2023-08-19 LAB — HEMOGLOBIN A1C
Est. average glucose Bld gHb Est-mCnc: 111 mg/dL
Hgb A1c MFr Bld: 5.5 % (ref 4.8–5.6)

## 2023-08-24 ENCOUNTER — Encounter: Payer: Self-pay | Admitting: Family Medicine

## 2023-08-24 ENCOUNTER — Ambulatory Visit (INDEPENDENT_AMBULATORY_CARE_PROVIDER_SITE_OTHER): Payer: Federal, State, Local not specified - PPO | Admitting: Family Medicine

## 2023-08-24 VITALS — BP 120/84 | HR 94 | Resp 18 | Ht 65.0 in | Wt 262.0 lb

## 2023-08-24 DIAGNOSIS — E7800 Pure hypercholesterolemia, unspecified: Secondary | ICD-10-CM | POA: Diagnosis not present

## 2023-08-24 DIAGNOSIS — R1031 Right lower quadrant pain: Secondary | ICD-10-CM

## 2023-08-24 DIAGNOSIS — J019 Acute sinusitis, unspecified: Secondary | ICD-10-CM | POA: Diagnosis not present

## 2023-08-24 DIAGNOSIS — Z6841 Body Mass Index (BMI) 40.0 and over, adult: Secondary | ICD-10-CM | POA: Diagnosis not present

## 2023-08-24 DIAGNOSIS — G629 Polyneuropathy, unspecified: Secondary | ICD-10-CM

## 2023-08-24 DIAGNOSIS — E66813 Obesity, class 3: Secondary | ICD-10-CM

## 2023-08-24 DIAGNOSIS — I10 Essential (primary) hypertension: Secondary | ICD-10-CM

## 2023-08-24 DIAGNOSIS — E039 Hypothyroidism, unspecified: Secondary | ICD-10-CM

## 2023-08-24 DIAGNOSIS — Z0000 Encounter for general adult medical examination without abnormal findings: Secondary | ICD-10-CM | POA: Diagnosis not present

## 2023-08-24 DIAGNOSIS — L989 Disorder of the skin and subcutaneous tissue, unspecified: Secondary | ICD-10-CM

## 2023-08-24 DIAGNOSIS — B9689 Other specified bacterial agents as the cause of diseases classified elsewhere: Secondary | ICD-10-CM

## 2023-08-24 MED ORDER — ATORVASTATIN CALCIUM 10 MG PO TABS: ORAL_TABLET | ORAL | 1 refills | Status: DC

## 2023-08-24 MED ORDER — WEGOVY 0.5 MG/0.5ML ~~LOC~~ SOAJ: 0.5000 mg | SUBCUTANEOUS | 0 refills | Status: DC

## 2023-08-24 MED ORDER — NITROGLYCERIN 0.4 MG SL SUBL: 0.4000 mg | SUBLINGUAL_TABLET | SUBLINGUAL | 1 refills | Status: AC | PRN

## 2023-08-24 MED ORDER — GABAPENTIN 600 MG PO TABS: ORAL_TABLET | ORAL | 0 refills | Status: DC

## 2023-08-24 MED ORDER — AMOXICILLIN-POT CLAVULANATE 875-125 MG PO TABS
1.0000 | ORAL_TABLET | Freq: Two times a day (BID) | ORAL | 0 refills | Status: DC
Start: 2023-08-24 — End: 2023-11-09

## 2023-08-24 MED ORDER — ALPRAZOLAM 0.5 MG PO TABS: 0.2500 mg | ORAL_TABLET | Freq: Every day | ORAL | 0 refills | Status: AC | PRN

## 2023-08-24 NOTE — Assessment & Plan Note (Signed)
Starting weight: 289 lbs (BMI 48.23) with hypertension, hypothyroid, and chronic pain. Tolerating starting dose of Wegovy well, increase to Wegovy 0.5 mg weekly.  Continue low calorie diet prioritizing protein and incorporating exercise as much as tolerated.  Will continue to monitor.

## 2023-08-24 NOTE — Progress Notes (Signed)
Complete physical exam  Patient: Pamela Esparza   DOB: [birth year]   57 y.o. Female  MRN: [id number]  Subjective:    Chief Complaint  Patient presents with   Annual Exam    Pamela Esparza is a 57 y.o. female who presents today for a complete physical exam. She generally feels fairly well. She does have several additional problems to discuss today.  Her right trapezius and neck has been hurting and often causes her headaches.  She has been using heat on the area with minimal improvement.  She also complains of ongoing sinus tenderness and nasal congestion even after finishing the full course of amoxicillin.  She needs a referral to a new neurologist that is closer as she is driving an hour currently.  She has noticed small firm bumps developing on both legs and arms.  They are tender to the touch, occasionally the spots on her legs are painful even without being touched.  This has not been evaluated thus far.  Her right lower quadrant abdomen has also been painful, she notes that she has been struggling to have bowel movements.   Most recent fall risk assessment:    08/03/2023    2:26 PM  Fall Risk   Falls in the past year? 1  Number falls in past yr: 1  Injury with Fall? 1  Comment bumps and bruises  Risk for fall due to : History of fall(s)  Follow up Falls evaluation completed     Most recent depression and anxiety screenings:    08/24/2023    2:53 PM 08/03/2023    2:27 PM  PHQ 2/9 Scores  PHQ - 2 Score 0 2  PHQ- 9 Score 3 9      08/24/2023    2:53 PM 08/03/2023    2:27 PM 05/24/2023    1:41 PM 03/02/2023    2:35 PM  GAD 7 : Generalized Anxiety Score  Nervous, Anxious, on Edge 0 0 3 1  Control/stop worrying 0 1 1 1   Worry too much - different things 3 1 1 2   Trouble relaxing 0 0 1 0  Restless 0 0 0 0  Easily annoyed or irritable 0 0 1 1  Afraid - awful might happen 0 0 0 0  Total GAD 7 Score 3 2 7 5   Anxiety Difficulty Not difficult at all Not difficult at all  Somewhat difficult Somewhat difficult    Patient Active Problem List   Diagnosis Date Noted   Polyp of colon 01/27/2023   Hyperlipidemia 04/28/2022   Pituitary microadenoma (HCC) 09/14/2021   Primary osteoarthritis 09/03/2021   Sjogren's disease (HCC) 04/16/2021   Spondylosis of lumbar region without myelopathy or radiculopathy 02/04/2021   Essential (primary) hypertension 02/02/2021   Class 3 severe obesity due to excess calories with serious comorbidity and body mass index (BMI) of 45.0 to 49.9 in adult (HCC) 02/02/2021   Chronic pain syndrome 01/12/2021   Peroneal tendinitis, right leg    Multiple atypical nevi 08/14/2019   Haglund's deformity of right heel 06/25/2019   Hypothyroidism 03/21/2019   Chronic joint pain 10/23/2018   DDD (degenerative disc disease), lumbar 10/23/2018   Muscle pain, fibromyalgia 10/23/2018   Lumbosacral spondylosis without myelopathy 08/20/2015   Neuropathy 07/11/2013    Past Surgical History:  Procedure Laterality Date   ANKLE SURGERY Right    APPENDECTOMY     CHOLECYSTECTOMY     ESOPHAGOGASTRODUODENOSCOPY (EGD) WITH PROPOFOL N/A 12/05/2020   Procedure: ESOPHAGOGASTRODUODENOSCOPY (EGD) WITH  PROPOFOL;  Surgeon: Pasty Spillers, MD;  Location: [facility] ENDOSCOPY;  Service: Endoscopy;  Laterality: N/A;   finger sugery     NECK SURGERY     SPINE SURGERY     On my neck   TENDON TRANSFER Right 11/06/2019   Procedure: PERONEAL RECONSTRUCTION WITH PERONEAL LONGUS RIGHT LEG;  Surgeon: Nadara Mustard, MD;  Location: [facility];  Service: Orthopedics;  Laterality: Right;   TOTAL ABDOMINAL HYSTERECTOMY     TUBAL LIGATION N/A    Phreesia 10/13/2020   Social History   Tobacco Use   Smoking status: Never    Passive exposure: Never   Smokeless tobacco: Never   Tobacco comments:    Never smoked  Vaping Use   Vaping status: Never Used  Substance Use Topics   Alcohol use: Not Currently    Comment: 4 drinks a year-rare   Drug use:  Never   Family History  Problem Relation Age of Onset   Breast cancer Mother 68       second cancer at 68   Lung cancer Mother 66   Arthritis Mother    Cancer Mother    COPD Mother    Heart attack Father    Hypertension Father    Diabetes Father    Bladder Cancer Father 34   Arthritis Father    Asthma Father    Cancer Father    COPD Father    Heart disease Father    Stroke Father    Vision loss Father    Lung cancer Maternal Aunt    Cancer Maternal Aunt    Brain cancer Paternal Uncle    Thyroid disease Paternal Uncle    Birth defects Maternal Grandmother        ovarian   Depression Maternal Grandmother    Uterine cancer Maternal Grandmother        d. 57s   Cancer Maternal Grandmother    Brain cancer Paternal Grandmother    Cancer Paternal Grandmother    Obesity Paternal Grandmother    ADD / ADHD Son    Cancer Paternal Uncle    Early death Paternal Uncle    Obesity Paternal Uncle    Colon cancer Neg Hx    Colon polyps Neg Hx    Esophageal cancer Neg Hx    Stomach cancer Neg Hx    Rectal cancer Neg Hx    Allergies  Allergen Reactions   Aspirin Other (See Comments)    stomach upset stomach upset    Hydrocodone-Acetaminophen Other (See Comments)    made her sick made her sick    Azithromycin     Patient states it does not work for her   Codeine     Other reaction(s): Upset stomach (finding)   Lyrica [Pregabalin]    Solu-Medrol [Methylprednisolone] Itching   Topiramate      Patient Care Team: Melida Quitter, PA as PCP - General (Family Medicine) Meriam Sprague, MD (Inactive) as PCP - Cardiology (Cardiology)   Outpatient Medications Prior to Visit  Medication Sig   albuterol (VENTOLIN HFA) 108 (90 Base) MCG/ACT inhaler INHALE 1 TO 2 PUFFS INTO THE LUNGS EVERY 6 HOURS AS NEEDED   Cholecalciferol (VITAMIN D3) 75 MCG (3000 UT) TABS Take 5,000 Int'l Units by mouth daily.   cyclobenzaprine (FLEXERIL) 5 MG tablet TAKE 1 TABLET BY MOUTH AT NIGHT FOR  1 WEEK THEN TAKE 1 TABLET BY MOUTH TWICE DAILY   DULoxetine (CYMBALTA) 60 MG capsule Take 120  mg by mouth daily.   fluticasone (FLONASE) 50 MCG/ACT nasal spray SHAKE LIQUID AND USE 1 SPRAY IN EACH NOSTRIL DAILY   hydroxychloroquine (PLAQUENIL) 200 MG tablet Take 200 mg by mouth 2 (two) times daily.   levothyroxine (SYNTHROID) 50 MCG tablet Take 1 tablet (50 mcg total) by mouth daily before breakfast.   meloxicam (MOBIC) 15 MG tablet Take 15 mg by mouth.   Multiple Vitamins-Minerals (CENTRUM SILVER PO) Take by mouth.   nystatin (MYCOSTATIN/NYSTOP) powder Apply topically 2 (two) times daily.   oxyCODONE-acetaminophen (PERCOCET) 10-325 MG tablet Take 1 tablet by mouth 2 (two) times daily.   pilocarpine (SALAGEN) 5 MG tablet Take 5 mg by mouth 3 (three) times daily.   [DISCONTINUED] ALPRAZolam (XANAX) 0.5 MG tablet Take 0.5 mg by mouth daily as needed.   [DISCONTINUED] gabapentin (NEURONTIN) 600 MG tablet TAKE 1 TABLET(600 MG) BY MOUTH THREE TIMES DAILY   [DISCONTINUED] nitroGLYCERIN (NITROSTAT) 0.4 MG SL tablet Place under the tongue.   [DISCONTINUED] WEGOVY 0.5 MG/0.5ML SOAJ Inject 0.5 mg into the skin once a week.   [DISCONTINUED] atorvastatin (LIPITOR) 10 MG tablet TAKE 1 TABLET(10 MG) BY MOUTH DAILY (Patient not taking: Reported on 08/24/2023)   No facility-administered medications prior to visit.    Review of Systems  Constitutional:  Negative for chills, fever and malaise/fatigue.  HENT:  Positive for congestion and sinus pain. Negative for hearing loss.   Eyes:  Negative for blurred vision and double vision.  Respiratory:  Negative for cough and shortness of breath.   Cardiovascular:  Negative for chest pain, palpitations and leg swelling.  Gastrointestinal:  Positive for abdominal pain and constipation. Negative for diarrhea and heartburn.  Genitourinary:  Negative for dysuria, frequency and urgency.  Musculoskeletal:  Positive for neck pain. Negative for myalgias.  Neurological:   Negative for headaches.  Endo/Heme/Allergies:  Negative for polydipsia.  Psychiatric/Behavioral:  Negative for depression. The patient is not nervous/anxious.       Objective:    BP 120/84 (BP Location: Left Arm, Patient Position: Sitting, Cuff Size: Large)   Pulse 94   Resp 18   Ht 5\' 5"  (1.651 m)   Wt 262 lb (118.8 kg)   SpO2 92%   BMI 43.60 kg/m    Physical Exam Constitutional:      General: She is not in acute distress.    Appearance: Normal appearance.  HENT:     Head: Normocephalic and atraumatic.     Right Ear: Tympanic membrane, ear canal and external ear normal.     Left Ear: Tympanic membrane, ear canal and external ear normal.     Nose: No congestion or rhinorrhea.     Right Sinus: Maxillary sinus tenderness present. No frontal sinus tenderness.     Left Sinus: Maxillary sinus tenderness present. No frontal sinus tenderness.     Mouth/Throat:     Mouth: Mucous membranes are moist.     Pharynx: No oropharyngeal exudate or posterior oropharyngeal erythema.  Eyes:     Extraocular Movements: Extraocular movements intact.     Conjunctiva/sclera: Conjunctivae normal.     Pupils: Pupils are equal, round, and reactive to light.     Comments: Wearing glasses  Neck:     Thyroid: No thyroid mass, thyromegaly or thyroid tenderness.  Cardiovascular:     Rate and Rhythm: Normal rate and regular rhythm.     Heart sounds: Normal heart sounds. No murmur heard.    No friction rub. No gallop.  Pulmonary:  Effort: Pulmonary effort is normal. No respiratory distress.     Breath sounds: Normal breath sounds. No wheezing, rhonchi or rales.  Abdominal:     General: Abdomen is flat. Bowel sounds are normal. There is no distension.     Palpations: There is no mass.     Tenderness: There is no abdominal tenderness. There is no guarding.  Musculoskeletal:        General: Normal range of motion.     Cervical back: Normal range of motion and neck supple.  Lymphadenopathy:      Cervical: No cervical adenopathy.  Skin:    General: Skin is warm and dry.  Neurological:     Mental Status: She is alert and oriented to person, place, and time.     Cranial Nerves: No cranial nerve deficit.     Motor: No weakness.     Deep Tendon Reflexes: Reflexes normal.  Psychiatric:        Mood and Affect: Mood normal.        Assessment & Plan:    Routine Health Maintenance and Physical Exam  Immunization History  Administered Date(s) Administered   Hep A / Hep B 02/10/2021, 03/13/2021   Moderna Sars-Covid-2 Vaccination 03/05/2020, 04/02/2020   Tdap 03/21/2019   Zoster Recombinant(Shingrix) 10/14/2020, 04/16/2021    Health Maintenance  Topic Date Due   COVID-19 Vaccine (3 - Moderna risk series) 09/09/2023 (Originally 04/30/2020)   INFLUENZA VACCINE  12/26/2023 (Originally 04/28/2023)   Medicare Annual Wellness (AWV)  12/27/2023   MAMMOGRAM  08/10/2025   Colonoscopy  02/21/2026   DTaP/Tdap/Td (2 - Td or Tdap) 03/20/2029   Hepatitis C Screening  Completed   HIV Screening  Completed   Zoster Vaccines- Shingrix  Completed   HPV VACCINES  Aged Out    Reviewed most recent labs including CBC, CMP, lipid panel, A1C, TSH, and vitamin D. All within normal limits/stable from last check other than sodium barely elevated at 145.  Discussed health benefits of physical activity, and encouraged her to engage in regular exercise appropriate for her age and condition.  Wellness examination  Pure hypercholesterolemia Assessment & Plan: Patient has been unable to fill atorvastatin prescription for 2 months and cholesterol levels remain stable.  Trial of only nonpharmacologic management of hyperlipidemia.  Recheck lipid panel in 6 months.   Neuropathy Assessment & Plan: Stable.  Continue gabapentin 600 mg 3 times a day.  New referral sent into close or neurology office.  Orders: -     Gabapentin; TAKE 1 TABLET(600 MG) BY MOUTH THREE TIMES DAILY  Dispense: 270 tablet; Refill: 0 -      Ambulatory referral to Neurology  Class 3 severe obesity due to excess calories with serious comorbidity and body mass index (BMI) of 45.0 to 49.9 in adult [facility]) Assessment & Plan: Starting weight: 289 lbs (BMI 48.23) with hypertension, hypothyroid, and chronic pain. Tolerating starting dose of Wegovy well, increase to Wegovy 0.5 mg weekly.  Continue low calorie diet prioritizing protein and incorporating exercise as much as tolerated.  Will continue to monitor.  Orders: -     Wegovy; Inject 0.5 mg into the skin once a week.  Dispense: 6 mL; Refill: 0  Acute bacterial sinusitis -     Amoxicillin-Pot Clavulanate; Take 1 tablet by mouth 2 (two) times daily.  Dispense: 20 tablet; Refill: 0  Essential (primary) hypertension Assessment & Plan: BP goal <140/90.  Continue nonpharmacologic interventions, will continue to monitor.   Acquired hypothyroidism Assessment & Plan: Stable,  continue follow-up with endocrinology.  TSH within normal limits.  Continue levothyroxine 50 mcg daily unless endocrinology adjust dosing.   RLQ abdominal pain  Bumps on skin  Other orders -     Nitroglycerin; Place 1 tablet (0.4 mg total) under the tongue every 5 (five) minutes as needed for chest pain.  Dispense: 10 tablet; Refill: 1 -     ALPRAZolam; Take 0.5-1 tablets (0.25-0.5 mg total) by mouth daily as needed.  Dispense: 10 tablet; Refill: 0    Return in about 6 months (around 02/21/2024) for follow-up for HLD, weight management, fasting blood work 1 week before.     Melida Quitter, PA

## 2023-08-24 NOTE — Assessment & Plan Note (Signed)
Stable, continue follow-up with endocrinology.  TSH within normal limits.  Continue levothyroxine 50 mcg daily unless endocrinology adjust dosing.

## 2023-08-24 NOTE — Assessment & Plan Note (Signed)
Stable.  Continue gabapentin 600 mg 3 times a day.  New referral sent into close or neurology office.

## 2023-08-24 NOTE — Assessment & Plan Note (Signed)
BP goal <140/90.  Continue nonpharmacologic interventions, will continue to monitor.

## 2023-08-24 NOTE — Assessment & Plan Note (Signed)
Patient has been unable to fill atorvastatin prescription for 2 months and cholesterol levels remain stable.  Trial of only nonpharmacologic management of hyperlipidemia.  Recheck lipid panel in 6 months.

## 2023-08-30 ENCOUNTER — Encounter: Payer: Self-pay | Admitting: Family Medicine

## 2023-08-30 DIAGNOSIS — M797 Fibromyalgia: Secondary | ICD-10-CM

## 2023-09-03 ENCOUNTER — Ambulatory Visit
Admission: RE | Admit: 2023-09-03 | Discharge: 2023-09-03 | Disposition: A | Discharge status: Home / Self Care | Payer: Federal, State, Local not specified - PPO | Source: Ambulatory Visit | Attending: Neurosurgery

## 2023-09-03 DIAGNOSIS — E221 Hyperprolactinemia: Secondary | ICD-10-CM | POA: Diagnosis not present

## 2023-09-03 DIAGNOSIS — D352 Benign neoplasm of pituitary gland: Secondary | ICD-10-CM

## 2023-09-03 MED ORDER — GADOPICLENOL 0.5 MMOL/ML IV SOLN
10.0000 mL | Freq: Once | INTRAVENOUS | Status: AC | PRN
Start: 2023-09-03 — End: 2023-09-03
  Administered 2023-09-03: 10 mL via INTRAVENOUS

## 2023-09-05 ENCOUNTER — Other Ambulatory Visit (HOSPITAL_COMMUNITY): Payer: Self-pay

## 2023-09-05 ENCOUNTER — Other Ambulatory Visit: Payer: Self-pay | Admitting: Family Medicine

## 2023-09-05 DIAGNOSIS — E66813 Obesity, class 3: Secondary | ICD-10-CM

## 2023-09-05 MED ORDER — WEGOVY 0.5 MG/0.5ML ~~LOC~~ SOAJ
0.5000 mg | SUBCUTANEOUS | 0 refills | Status: DC
  Filled 2023-09-05: qty 2, 28d supply, fill #0

## 2023-09-05 NOTE — Addendum Note (Signed)
Addended by: Saralyn Pilar on: 09/05/2023 04:15 PM   Modules accepted: Orders

## 2023-09-06 ENCOUNTER — Ambulatory Visit (INDEPENDENT_AMBULATORY_CARE_PROVIDER_SITE_OTHER): Payer: Federal, State, Local not specified - PPO | Admitting: Orthopedic Surgery

## 2023-09-06 ENCOUNTER — Other Ambulatory Visit (HOSPITAL_COMMUNITY): Payer: Self-pay

## 2023-09-06 DIAGNOSIS — M25561 Pain in right knee: Secondary | ICD-10-CM

## 2023-09-06 MED ORDER — SAXENDA 18 MG/3ML ~~LOC~~ SOPN: 1.8000 mg | PEN_INJECTOR | Freq: Every day | SUBCUTANEOUS | 2 refills | Status: DC

## 2023-09-06 NOTE — Addendum Note (Signed)
Addended by: Saralyn Pilar on: 09/06/2023 11:57 AM   Modules accepted: Orders

## 2023-09-07 ENCOUNTER — Encounter: Payer: Self-pay | Admitting: Orthopedic Surgery

## 2023-09-07 NOTE — Progress Notes (Signed)
Office Visit Note   Patient: Pamela Esparza           Date of Birth: 23-Jul-1966           MRN: [id number] Visit Date: 09/06/2023              Requested by: Melida Quitter, PA [address] [place],  [place] [address] PCP: Melida Quitter, [place]  Chief Complaint  Patient presents with   Right Knee - Follow-up    08/09/2023 right knee injection       HPI: Patient is a 57 year old woman is seen in follow-up for osteoarthritis right knee.  She states the injection helped a lot and she is doing much better.  She states she does have increased swelling with increased activities.  Patient states she also follows up with a pain clinic.  Assessment & Plan: Visit Diagnoses:  1. Acute pain of right knee     Plan: Recommended strength training and exercise.  Follow-Up Instructions: Return if symptoms worsen or fail to improve.   Ortho Exam  Patient is alert, oriented, no adenopathy, well-dressed, normal affect, normal respiratory effort. Examination patient has no effusion there is no crepitation with range of motion collaterals and cruciates are stable to the right knee.  Imaging: No results found. No images are attached to the encounter.  Labs: Lab Results  Component Value Date   HGBA1C 5.5 08/18/2023   HGBA1C 5.6 12/20/2022   HGBA1C 5.6 04/21/2022     Lab Results  Component Value Date   ALBUMIN 4.3 08/18/2023   ALBUMIN 4.2 12/20/2022   ALBUMIN 4.6 04/21/2022    No results found for: "MG" Lab Results  Component Value Date   VD25OH 65.4 08/18/2023   VD25OH 50.25 09/14/2021   VD25OH 51.3 02/11/2021    No results found for: "PREALBUMIN"    Latest Ref Rng & Units 08/18/2023    8:48 AM 12/20/2022    8:56 AM 04/21/2022    9:03 AM  CBC EXTENDED  WBC 3.4 - 10.8 x10E3/uL 7.6  6.7  8.9   RBC 3.77 - 5.28 x10E6/uL 4.55  4.51  4.65   Hemoglobin 11.1 - 15.9 g/dL 25.3  66.4  40.3   HCT 34.0 - 46.6 % 43.8  42.6  43.2   Platelets 150 - 450 x10E3/uL 270  286   326   NEUT# 1.4 - 7.0 x10E3/uL 4.2   5.8   Lymph# 0.7 - 3.1 x10E3/uL 2.6   2.6      There is no height or weight on file to calculate BMI.  Orders:  No orders of the defined types were placed in this encounter.  No orders of the defined types were placed in this encounter.    Procedures: No procedures performed  Clinical Data: No additional findings.  ROS:  All other systems negative, except as noted in the HPI. Review of Systems  Objective: Vital Signs: There were no vitals taken for this visit.  Specialty Comments:  No specialty comments available.  PMFS History: Patient Active Problem List   Diagnosis Date Noted   Polyp of colon 01/27/2023   Hyperlipidemia 04/28/2022   Pituitary microadenoma (HCC) 09/14/2021   Primary osteoarthritis 09/03/2021   Sjogren's disease (HCC) 04/16/2021   Spondylosis of lumbar region without myelopathy or radiculopathy 02/04/2021   Essential (primary) hypertension 02/02/2021   Class 3 severe obesity due to excess calories with serious comorbidity and body mass index (BMI) of 45.0 to 49.9 in  adult (HCC) 02/02/2021   Chronic pain syndrome 01/12/2021   Peroneal tendinitis, right leg    Multiple atypical nevi 08/14/2019   Haglund's deformity of right heel 06/25/2019   Hypothyroidism 03/21/2019   Chronic joint pain 10/23/2018   DDD (degenerative disc disease), lumbar 10/23/2018   Muscle pain, fibromyalgia 10/23/2018   Lumbosacral spondylosis without myelopathy 08/20/2015   Neuropathy 07/11/2013   Past Medical History:  Diagnosis Date   Allergy    Topiramate, Lyrica, aspirin (sensitive to)   Anxiety    Arthritis    Asthma    Cataract    DDD (degenerative disc disease), lumbar    Depression    Depression    Phreesia 10/13/2020   Family history of bladder cancer    Family history of brain cancer 02/15/2023   Fibromyalgia    Hyperlipidemia    Joint pain    Neuromuscular disorder (HCC)    Phreesia 10/13/2020   Neuropathy     Osteoporosis    Sjogren's disease (HCC)    Thyroid disease     Family History  Problem Relation Age of Onset   Breast cancer Mother 81       second cancer at 56   Lung cancer Mother 58   Arthritis Mother    Cancer Mother    COPD Mother    Heart attack Father    Hypertension Father    Diabetes Father    Bladder Cancer Father 108   Arthritis Father    Asthma Father    Cancer Father    COPD Father    Heart disease Father    Stroke Father    Vision loss Father    Lung cancer Maternal Aunt    Cancer Maternal Aunt    Brain cancer Paternal Uncle    Thyroid disease Paternal Uncle    Birth defects Maternal Grandmother        ovarian   Depression Maternal Grandmother    Uterine cancer Maternal Grandmother        d. 8s   Cancer Maternal Grandmother    Brain cancer Paternal Grandmother    Cancer Paternal Grandmother    Obesity Paternal Grandmother    ADD / ADHD Son    Cancer Paternal Uncle    Early death Paternal Uncle    Obesity Paternal Uncle    Colon cancer Neg Hx    Colon polyps Neg Hx    Esophageal cancer Neg Hx    Stomach cancer Neg Hx    Rectal cancer Neg Hx     Past Surgical History:  Procedure Laterality Date   ANKLE SURGERY Right    APPENDECTOMY     CHOLECYSTECTOMY     ESOPHAGOGASTRODUODENOSCOPY (EGD) WITH PROPOFOL N/A 12/05/2020   Procedure: ESOPHAGOGASTRODUODENOSCOPY (EGD) WITH PROPOFOL;  Surgeon: Pasty Spillers, MD;  Location: [facility] ENDOSCOPY;  Service: Endoscopy;  Laterality: N/A;   finger sugery     NECK SURGERY     SPINE SURGERY     On my neck   TENDON TRANSFER Right 11/06/2019   Procedure: PERONEAL RECONSTRUCTION WITH PERONEAL LONGUS RIGHT LEG;  Surgeon: Nadara Mustard, MD;  Location: [facility];  Service: Orthopedics;  Laterality: Right;   TOTAL ABDOMINAL HYSTERECTOMY     TUBAL LIGATION N/A    Phreesia 10/13/2020   Social History   Occupational History   Not on file  Tobacco Use   Smoking status: Never    Passive exposure:  Never   Smokeless tobacco: Never  Tobacco comments:    Never smoked  Vaping Use   Vaping status: Never Used  Substance and Sexual Activity   Alcohol use: Not Currently    Comment: 4 drinks a year-rare   Drug use: Never   Sexual activity: Not Currently    Comment: Total hysterectomy

## 2023-09-12 MED ORDER — FLUCONAZOLE 150 MG PO TABS: 150.0000 mg | ORAL_TABLET | Freq: Once | ORAL | 0 refills | Status: AC

## 2023-09-12 NOTE — Addendum Note (Signed)
Addended by: Saralyn Pilar on: 09/12/2023 08:08 AM   Modules accepted: Orders

## 2023-09-14 ENCOUNTER — Other Ambulatory Visit (HOSPITAL_COMMUNITY): Payer: Self-pay

## 2023-09-14 MED ORDER — NOVOFINE PEN NEEDLE 32G X 6 MM MISC: 1.0000 | Freq: Every day | 1 refills | Status: DC

## 2023-09-14 MED ORDER — NOVOFINE PEN NEEDLE 32G X 6 MM MISC
1.0000 | Freq: Every day | 1 refills | Status: DC
  Filled 2023-09-14: qty 100, 100d supply, fill #0

## 2023-09-14 NOTE — Addendum Note (Signed)
Addended by: Tonny Bollman on: 09/14/2023 07:54 AM   Modules accepted: Orders

## 2023-10-13 ENCOUNTER — Encounter: Payer: Self-pay | Admitting: Physical Medicine and Rehabilitation

## 2023-10-13 DIAGNOSIS — D352 Benign neoplasm of pituitary gland: Secondary | ICD-10-CM | POA: Diagnosis not present

## 2023-10-31 ENCOUNTER — Ambulatory Visit: Payer: Self-pay | Admitting: Family Medicine

## 2023-10-31 ENCOUNTER — Encounter (HOSPITAL_COMMUNITY): Payer: Self-pay

## 2023-10-31 ENCOUNTER — Emergency Department (HOSPITAL_COMMUNITY): Payer: Federal, State, Local not specified - PPO

## 2023-10-31 ENCOUNTER — Emergency Department (HOSPITAL_COMMUNITY)
Admission: EM | Admit: 2023-10-31 | Discharge: 2023-10-31 | Disposition: A | Discharge status: Home / Self Care | Payer: Federal, State, Local not specified - PPO | Attending: Emergency Medicine | Admitting: Emergency Medicine

## 2023-10-31 ENCOUNTER — Other Ambulatory Visit: Payer: Self-pay

## 2023-10-31 DIAGNOSIS — Z5329 Procedure and treatment not carried out because of patient's decision for other reasons: Secondary | ICD-10-CM | POA: Diagnosis not present

## 2023-10-31 DIAGNOSIS — R0789 Other chest pain: Secondary | ICD-10-CM | POA: Insufficient documentation

## 2023-10-31 LAB — BASIC METABOLIC PANEL
Anion gap: 12 (ref 5–15)
BUN: 12 mg/dL (ref 6–20)
CO2: 23 mmol/L (ref 22–32)
Calcium: 9.6 mg/dL (ref 8.9–10.3)
Chloride: 108 mmol/L (ref 98–111)
Creatinine, Ser: 0.85 mg/dL (ref 0.44–1.00)
GFR, Estimated: >60 mL/min (ref >60)
Glucose, Bld: 98 mg/dL (ref 70–99)
Potassium: 4.7 mmol/L (ref 3.5–5.1)
Sodium: 143 mmol/L (ref 135–145)

## 2023-10-31 LAB — CBC
HCT: 42.2 % (ref 36.0–46.0)
Hemoglobin: 14.1 g/dL (ref 12.0–15.0)
MCH: 32 pg (ref 26.0–34.0)
MCHC: 33.4 g/dL (ref 30.0–36.0)
MCV: 95.9 fL (ref 80.0–100.0)
Platelets: 313 10*3/uL (ref 150–400)
RBC: 4.4 MIL/uL (ref 3.87–5.11)
RDW: 13.8 % (ref 11.5–15.5)
WBC: 6.1 10*3/uL (ref 4.0–10.5)
nRBC: 0 % (ref 0.0–0.2)

## 2023-10-31 LAB — TROPONIN I (HIGH SENSITIVITY)
Troponin I (High Sensitivity): 3 ng/L (ref <18)
Troponin I (High Sensitivity): 3 ng/L (ref <18)

## 2023-10-31 MED ORDER — SODIUM CHLORIDE 0.9 % IV BOLUS
500.0000 mL | Freq: Once | INTRAVENOUS | Status: AC
  Administered 2023-10-31: 500 mL via INTRAVENOUS

## 2023-10-31 NOTE — ED Notes (Signed)
MD made aware of difficulty obtaining IV access

## 2023-10-31 NOTE — ED Provider Triage Note (Signed)
Emergency Medicine Provider Triage Evaluation Note  PAMELIA BOTTO , a 58 y.o. female  was evaluated in triage.  Pt complains of chest heaviness for months. Woke up 4 days ago and was generally very swollen. 2 days ago, she felt increased pain in her chest and upper abdomen with swallowing. Threw up water and started having worsening pains in middle of chest radiating under R breast. Intermittent RUQ pains for over a month. Has had posterior R leg pain for 5 years, doesn't know if this could be related to a blood clot.   Hx fibromyalgia, sjogren's  Review of Systems  Positive: Chest pains, SOB, RUQ pain Negative: Nausea, fever, cough  Physical Exam  BP 127/88 (BP Location: Right Arm)   Pulse 72   Temp 98 F (36.7 C)   Resp 16   Ht 5\' 5"  (1.651 m)   Wt 118.8 kg   SpO2 100%   BMI 43.58 kg/m  Gen:   Awake, no distress   Resp:  Normal effort  MSK:   Moves extremities without difficulty  Other:    Medical Decision Making  Medically screening exam initiated at 12:33 PM.  Appropriate orders placed.  Rashan Patient Age was informed that the remainder of the evaluation will be completed by another provider, this initial triage assessment does not replace that evaluation, and the importance of remaining in the ED until their evaluation is complete.  Workup initiated   Su Monks, PA-C 10/31/23 1237

## 2023-10-31 NOTE — ED Notes (Signed)
MD made aware of BP differences in different extremities: L arm 136/73, R arm 95/69

## 2023-10-31 NOTE — ED Triage Notes (Signed)
Pt c/o midsternal chest pain that radiates to right chest, SOBx2d. Pt states on Saturday she was having trouble breathing and had N/V

## 2023-10-31 NOTE — ED Notes (Signed)
Pt requesting to leave AMA. MD Messick made aware. IV removed, AMA form signed.

## 2023-10-31 NOTE — Telephone Encounter (Signed)
Copied from CRM [id number]. Topic: Clinical - Red Word Triage >> Oct 31, 2023  9:48 AM Gaetano Hawthorne wrote: Red Word that prompted transfer to Nurse Triage: severe chest pain on Saturday, had hard time to swallowing and hard time breathing. Patient is not sure if she had a heart attack or a stroke.   Chief Complaint: Chest pain Symptoms: chest tightness, nausea Frequency: constant Pertinent Negatives: Patient denies shortness of breath Disposition: [x] ED /[] Urgent Care (no appt availability in office) / [] Appointment(In office/virtual)/ []  [facility]/ [] Home Care/ [] Refused Recommended Disposition /[] [facility]/ []  Follow-up with PCP Additional Notes: Patient reports chest tightness for "some time" and states that 2 days ago, she had an episode when eating dinner where she had central chest pain that radiated under her right breast. Pt states that she become nauseated and lightheaded and eventually vomited. Pts that the central pain is no longer present now, but she still has pain under her breast. RN advising ED. Pt is agreeable states that she will go to [facility] ED at [facility].  Reason for Disposition  [1] Chest pain lasts > 5 minutes AND [2] occurred in past 3 days (72 hours) (Exception: Feels exactly the same as previously diagnosed heartburn and has accompanying sour taste in mouth.)  Answer Assessment - Initial Assessment Questions 1. LOCATION: "Where does it hurt?"       Center of chest   2. RADIATION: "Does the pain go anywhere else?" (e.g., into neck, jaw, arms, back)     Went to right side of chest under breast  3. ONSET: "When did the chest pain begin?" (Minutes, hours or days)      Started 2 days ago  4. PATTERN: "Does the pain come and go, or has it been constant since it started?"  "Does it get worse with exertion?"      Comes and goes  5. DURATION: "How long does it last" (e.g., seconds, minutes, hours)     Lasted several minutes  6. SEVERITY: "How bad is the  pain?"  (e.g., Scale 1-10; mild, moderate, or severe)    - MILD (1-3): doesn't interfere with normal activities     - MODERATE (4-7): interferes with normal activities or awakens from sleep    - SEVERE (8-10): excruciating pain, unable to do any normal activities       Moderate  7. CARDIAC RISK FACTORS: "Do you have any history of heart problems or risk factors for heart disease?" (e.g., angina, prior heart attack; diabetes, high blood pressure, high cholesterol, smoker, or strong family history of heart disease)     Father with history of heart attacks and strokes  8. PULMONARY RISK FACTORS: "Do you have any history of lung disease?"  (e.g., blood clots in lung, asthma, emphysema, birth control pills)     No  9. CAUSE: "What do you think is causing the chest pain?"     Thinking it could be stress related  10. OTHER SYMPTOMS: "Do you have any other symptoms?" (e.g., dizziness, nausea, vomiting, sweating, fever, difficulty breathing, cough)       Nausea when episode  Protocols used: Chest Pain-A-AH

## 2023-10-31 NOTE — ED Provider Notes (Signed)
[facility]ERGENCY DEPARTMENT AT [facility] Provider Note   CSN: [id number] Arrival date & time: 10/31/23  1117     History {Add pertinent medical, surgical, social history, OB history to HPI:1} Chief Complaint  Patient presents with   Chest Pain    Pamela Esparza is a 58 y.o. female.   Chest Pain      Home Medications Prior to Admission medications   Medication Sig Start Date End Date Taking? Authorizing Provider  albuterol (VENTOLIN HFA) 108 (90 Base) MCG/ACT inhaler INHALE 1 TO 2 PUFFS INTO THE LUNGS EVERY 6 HOURS AS NEEDED Patient taking differently: Inhale 1 puff into the lungs every 6 (six) hours as needed for wheezing or shortness of breath. 04/28/22  Yes Abonza, Maritza, PA-C  ALPRAZolam (XANAX) 0.5 MG tablet Take 0.5-1 tablets (0.25-0.5 mg total) by mouth daily as needed. Patient taking differently: Take 0.25-0.5 mg by mouth daily as needed for anxiety. 08/24/23  Yes Saralyn Pilar A, PA  Cholecalciferol (VITAMIN D3) 75 MCG (3000 UT) TABS Take 5,000 Int'l Units by mouth daily.   Yes [provider]  co-enzyme Q-10 30 MG capsule Take 30 mg by mouth daily.   Yes [provider]  cyclobenzaprine (FLEXERIL) 5 MG tablet Take 5 mg by mouth 3 (three) times daily as needed for muscle spasms.   Yes [provider]  DULoxetine (CYMBALTA) 60 MG capsule Take 120 mg by mouth daily. 12/25/15  Yes [provider]  fluticasone (FLONASE) 50 MCG/ACT nasal spray SHAKE LIQUID AND USE 1 SPRAY IN EACH NOSTRIL DAILY Patient taking differently: Place 1 spray into both nostrils daily as needed for allergies. 04/28/22  Yes Abonza, Maritza, PA-C  gabapentin (NEURONTIN) 300 MG capsule Take 300 mg by mouth at bedtime.   Yes [provider]  gabapentin (NEURONTIN) 600 MG tablet TAKE 1 TABLET(600 MG) BY MOUTH THREE TIMES DAILY 08/24/23  Yes Saralyn Pilar A, PA  hydroxychloroquine (PLAQUENIL) 200 MG tablet Take 200 mg by mouth 2 (two) times  daily. 07/13/21  Yes [provider]  levothyroxine (SYNTHROID) 50 MCG tablet Take 1 tablet (50 mcg total) by mouth daily before breakfast. 08/10/23  Yes Shamleffer, Konrad Dolores, MD  Liraglutide -Weight Management (SAXENDA) 18 MG/3ML SOPN Inject 1.8 mg into the skin daily. 09/06/23  Yes Saralyn Pilar A, PA  meloxicam (MOBIC) 15 MG tablet Take 15 mg by mouth at bedtime. 11/11/15  Yes [provider]  Multiple Vitamins-Minerals (CENTRUM SILVER PO) Take by mouth.   Yes [provider]  nitroGLYCERIN (NITROSTAT) 0.4 MG SL tablet Place 1 tablet (0.4 mg total) under the tongue every 5 (five) minutes as needed for chest pain. 08/24/23  Yes Saralyn Pilar A, PA  nystatin (MYCOSTATIN/NYSTOP) powder Apply topically 2 (two) times daily. Patient taking differently: Apply 1 Application topically daily as needed (for dry skin). 08/03/23  Yes Saralyn Pilar A, PA  oxyCODONE-acetaminophen (PERCOCET) 10-325 MG tablet Take 1 tablet by mouth every 4 (four) hours. 09/18/18  Yes [provider]  pilocarpine (SALAGEN) 5 MG tablet Take 5 mg by mouth 3 (three) times daily. 12/11/15  Yes [provider]  Probiotic Product (PROBIOTIC PO) Take 1 tablet by mouth at bedtime.   Yes [provider]  amoxicillin-clavulanate (AUGMENTIN) 875-125 MG tablet Take 1 tablet by mouth 2 (two) times daily. Patient not taking: Reported on 10/31/2023 08/24/23   Saralyn Pilar A, PA  Insulin Pen Needle (NOVOFINE PEN NEEDLE) 32G X 6 MM MISC 1 each by Does not  apply route daily. 09/14/23   Melida Quitter, PA      Allergies    Aspirin, Hydrocodone-acetaminophen, Azithromycin, Codeine, Lyrica [pregabalin], Solu-medrol [methylprednisolone], and Topiramate    Review of Systems   Review of Systems  Cardiovascular:  Positive for chest pain.    Physical Exam Updated Vital Signs BP (!) 113/53   Pulse 60   Temp 98 F (36.7 C)   Resp 16   Ht 5\' 5"  (1.651 m)   Wt 118.8 kg   SpO2  100%   BMI 43.58 kg/m  Physical Exam  ED Results / Procedures / Treatments   Labs (all labs ordered are listed, but only abnormal results are displayed) Labs Reviewed  BASIC METABOLIC PANEL  CBC  TROPONIN I (HIGH SENSITIVITY)  TROPONIN I (HIGH SENSITIVITY)    EKG EKG Interpretation Date/Time:  Monday October 31 2023 11:52:09 EST Ventricular Rate:  76 PR Interval:  146 QRS Duration:  82 QT Interval:  384 QTC Calculation: 432 R Axis:   47  Text Interpretation: Normal sinus rhythm Cannot rule out Anterior infarct , age undetermined Abnormal ECG No previous ECGs available Confirmed by Kristine Royal [phone]) on 10/31/2023 3:48:28 PM  Radiology DG Chest 2 View Result Date: 10/31/2023 CLINICAL DATA:  Midsternal chest pain. Pain radiates to the right chest. Shortness of breath. 2 days. EXAM: CHEST - 2 VIEW COMPARISON:  Cardiac CT 11/28/2020 FINDINGS: Cardiac silhouette and mediastinal contours are within limits. Mild horizontal curvilinear scarring within the lingula as seen on prior CT. No acute airspace opacity. No pleural effusion pneumothorax. Minimal dextrocurvature of the midthoracic spine. ACDF hardware overlies the lower cervical spine. IMPRESSION: No active cardiopulmonary disease. Electronically Signed   By: Neita Garnet M.D.   On: 10/31/2023 14:50    Procedures Procedures  {Document cardiac monitor, telemetry assessment procedure when appropriate:1}  Medications Ordered in ED Medications  sodium chloride 0.9 % bolus 500 mL (has no administration in time range)    ED Course/ Medical Decision Making/ A&P   {   Click here for ABCD2, HEART and other calculatorsREFRESH Note before signing :1}                              Medical Decision Making Amount and/or Complexity of Data Reviewed Labs: ordered. Radiology: ordered.   ***  {Document critical care time when appropriate:1} {Document review of labs and clinical decision tools ie heart score, Chads2Vasc2 etc:1}   {Document your independent review of radiology images, and any outside records:1} {Document your discussion with family members, caretakers, and with consultants:1} {Document social determinants of health affecting pt's care:1} {Document your decision making why or why not admission, treatments were needed:1} Final Clinical Impression(s) / ED Diagnoses Final diagnoses:  None    Rx / DC Orders ED Discharge Orders     None

## 2023-11-03 ENCOUNTER — Encounter: Payer: Self-pay | Admitting: Family Medicine

## 2023-11-09 ENCOUNTER — Ambulatory Visit
Admission: RE | Admit: 2023-11-09 | Discharge: 2023-11-09 | Disposition: A | Discharge status: Home / Self Care | Payer: Federal, State, Local not specified - PPO | Source: Ambulatory Visit | Attending: Family Medicine | Admitting: Family Medicine

## 2023-11-09 ENCOUNTER — Ambulatory Visit (INDEPENDENT_AMBULATORY_CARE_PROVIDER_SITE_OTHER): Payer: Federal, State, Local not specified - PPO | Admitting: Family Medicine

## 2023-11-09 ENCOUNTER — Encounter: Payer: Self-pay | Admitting: Family Medicine

## 2023-11-09 VITALS — BP 138/84 | HR 66 | Ht 65.0 in | Wt 262.2 lb

## 2023-11-09 DIAGNOSIS — R0789 Other chest pain: Secondary | ICD-10-CM | POA: Diagnosis not present

## 2023-11-09 DIAGNOSIS — Z6841 Body Mass Index (BMI) 40.0 and over, adult: Secondary | ICD-10-CM | POA: Diagnosis not present

## 2023-11-09 DIAGNOSIS — R1310 Dysphagia, unspecified: Secondary | ICD-10-CM

## 2023-11-09 DIAGNOSIS — Z09 Encounter for follow-up examination after completed treatment for conditions other than malignant neoplasm: Secondary | ICD-10-CM

## 2023-11-09 DIAGNOSIS — E66813 Obesity, class 3: Secondary | ICD-10-CM | POA: Diagnosis not present

## 2023-11-09 MED ORDER — IOPAMIDOL (ISOVUE-370) INJECTION 76%
100.0000 mL | Freq: Once | INTRAVENOUS | Status: AC | PRN
  Administered 2023-11-09: 75 mL via INTRAVENOUS

## 2023-11-09 MED ORDER — WEGOVY 0.5 MG/0.5ML ~~LOC~~ SOAJ
0.5000 mg | SUBCUTANEOUS | 0 refills | Status: DC
Start: 2023-11-09 — End: 2023-11-10

## 2023-11-09 NOTE — Progress Notes (Addendum)
 Established Patient Office Visit  Subjective   Patient ID: Pamela Esparza, female    DOB: 1966-01-08  Age: 58 y.o. MRN: [id number]  Chief Complaint  Patient presents with   Hospitalization Follow-up    HPI Pamela Esparza is a 58 y.o. female presenting today for follow up of ED visit for chest pain.  Upon presentation, endorsed 3-4 days of worsening right sided chest discomfort.  EKG without evidence of acute ischemia and negative troponin x 2.  CXR without acute abnormality, patient left AMA without completing CTA imaging to rule out PE.  Chest pain has been significantly better since the episode that led her to go to the emergency room.  She described the episode in the office as a feeling of chest heaviness for a few days that made her believe that she was possibly getting sick.  She was then eating dinner of rice and beef and developed a significant pain after swallowing in the center of her chest radiating under her right breast.  Her next bite of food she made sure to chew really well, and she experienced the same sensation.  She tried drinking water to "force the food down", but instead this made her vomit the water and food that she had previously consumed.  Outpatient Medications Prior to Visit  Medication Sig   albuterol (VENTOLIN HFA) 108 (90 Base) MCG/ACT inhaler INHALE 1 TO 2 PUFFS INTO THE LUNGS EVERY 6 HOURS AS NEEDED (Patient taking differently: Inhale 1 puff into the lungs every 6 (six) hours as needed for wheezing or shortness of breath.)   ALPRAZolam (XANAX) 0.5 MG tablet Take 0.5-1 tablets (0.25-0.5 mg total) by mouth daily as needed. (Patient taking differently: Take 0.25-0.5 mg by mouth daily as needed for anxiety.)   Cholecalciferol (VITAMIN D3) 75 MCG (3000 UT) TABS Take 5,000 Int'l Units by mouth daily.   co-enzyme Q-10 30 MG capsule Take 30 mg by mouth daily.   cyclobenzaprine (FLEXERIL) 5 MG tablet Take 5 mg by mouth 3 (three) times daily as needed for muscle  spasms.   DULoxetine (CYMBALTA) 60 MG capsule Take 120 mg by mouth daily.   fluticasone (FLONASE) 50 MCG/ACT nasal spray SHAKE LIQUID AND USE 1 SPRAY IN EACH NOSTRIL DAILY (Patient taking differently: Place 1 spray into both nostrils daily as needed for allergies.)   gabapentin (NEURONTIN) 300 MG capsule Take 300 mg by mouth at bedtime.   gabapentin (NEURONTIN) 600 MG tablet TAKE 1 TABLET(600 MG) BY MOUTH THREE TIMES DAILY   hydroxychloroquine (PLAQUENIL) 200 MG tablet Take 200 mg by mouth 2 (two) times daily.   levothyroxine (SYNTHROID) 50 MCG tablet Take 1 tablet (50 mcg total) by mouth daily before breakfast.   meloxicam (MOBIC) 15 MG tablet Take 15 mg by mouth at bedtime.   Multiple Vitamins-Minerals (CENTRUM SILVER PO) Take by mouth.   nitroGLYCERIN (NITROSTAT) 0.4 MG SL tablet Place 1 tablet (0.4 mg total) under the tongue every 5 (five) minutes as needed for chest pain.   nystatin (MYCOSTATIN/NYSTOP) powder Apply topically 2 (two) times daily. (Patient taking differently: Apply 1 Application topically daily as needed (for dry skin).)   oxyCODONE-acetaminophen (PERCOCET) 10-325 MG tablet Take 1 tablet by mouth every 4 (four) hours.   pilocarpine (SALAGEN) 5 MG tablet Take 5 mg by mouth 3 (three) times daily.   Probiotic Product (PROBIOTIC PO) Take 1 tablet by mouth at bedtime.   [DISCONTINUED] amoxicillin-clavulanate (AUGMENTIN) 875-125 MG tablet Take 1 tablet by mouth 2 (two) times daily. (  Patient not taking: Reported on 10/31/2023)   [DISCONTINUED] Insulin Pen Needle (NOVOFINE PEN NEEDLE) 32G X 6 MM MISC 1 each by Does not apply route daily. (Patient not taking: Reported on 11/09/2023)   [DISCONTINUED] Liraglutide -Weight Management (SAXENDA) 18 MG/3ML SOPN Inject 1.8 mg into the skin daily. (Patient not taking: Reported on 11/09/2023)   No facility-administered medications prior to visit.    ROS Negative unless otherwise noted in HPI   Objective:     BP 138/84   Pulse 66   Ht 5\' 5"   (1.651 m)   Wt 262 lb 4 oz (119 kg)   SpO2 100%   BMI 43.64 kg/m   Physical Exam Constitutional:      General: She is not in acute distress.    Appearance: Normal appearance.  HENT:     Head: Normocephalic and atraumatic.  Cardiovascular:     Rate and Rhythm: Normal rate and regular rhythm.     Heart sounds: No murmur heard.    No friction rub. No gallop.  Pulmonary:     Effort: Pulmonary effort is normal. No respiratory distress.     Breath sounds: No wheezing, rhonchi or rales.  Skin:    General: Skin is warm and dry.  Neurological:     Mental Status: She is alert and oriented to person, place, and time.      Assessment & Plan:  Atypical chest pain -     CT Angio Chest Pulmonary Embolism (PE) W or WO Contrast; Future  Hospital discharge follow-up  Class 3 severe obesity due to excess calories with serious comorbidity and body mass index (BMI) of 45.0 to 49.9 in adult [facility]) Assessment & Plan: Starting weight: 289 lbs (BMI 48.23) with hypertension, hypothyroid, and chronic pain. She received clarification that her insurance will cover [phone], it is just in a different tier.  Given that Saxenda was ineffective for weight loss, restart Wegovy 0.5 mg weekly.  Continue low calorie diet prioritizing protein and incorporating exercise as much as tolerated.  She is working with [employer] loss program and meeting with a nutritionist for additional support. Will continue to monitor.   Dysphagia, unspecified type -     Ambulatory referral to Gastroenterology  Ordering outpatient CTA to rule out pulmonary embolism per ED provider recommendations which PCP agrees with.  Will also refer to gastroenterology for evaluation and barium swallow to evaluate dysphagia.  Return in about 3 months (around 02/21/2024) for follow-up for HLD, weight management, fasting labs 1 week before.   I spent 40 minutes on the day of the encounter to include pre-visit record review, face-to-face  time with the patient, and post visit ordering of test, medications, and referrals.  Melida Quitter, PA

## 2023-11-09 NOTE — Assessment & Plan Note (Addendum)
 Starting weight: 289 lbs (BMI 48.23) with hypertension, hypothyroid, and chronic pain. She received clarification that her insurance will cover [phone], it is just in a different tier.  Given that Saxenda was ineffective for weight loss, restart Wegovy 0.5 mg weekly.  Continue low calorie diet prioritizing protein and incorporating exercise as much as tolerated.  She is working with [employer] loss program and meeting with a nutritionist for additional support. Will continue to monitor.

## 2023-11-10 ENCOUNTER — Encounter: Payer: Self-pay | Admitting: Family Medicine

## 2023-11-10 ENCOUNTER — Telehealth: Payer: Self-pay

## 2023-11-10 DIAGNOSIS — E66813 Obesity, class 3: Secondary | ICD-10-CM

## 2023-11-10 MED ORDER — WEGOVY 0.5 MG/0.5ML ~~LOC~~ SOAJ
0.5000 mg | SUBCUTANEOUS | 5 refills | Status: DC
Start: 2023-11-10 — End: 2023-12-01

## 2023-11-10 NOTE — Telephone Encounter (Signed)
Copied from CRM [id number]. Topic: Clinical - Prescription Issue >> Nov 10, 2023  1:12 PM Ivette P wrote: Reason for CRM: Pt called in to request rewrite of prescription. Pt was given 1 month supply pt would like a 6 month supply sent to [facility].   Medication: WEGOVY 0.5 MG/0.5ML SOAJ  Pt call back [phone]

## 2023-11-10 NOTE — Telephone Encounter (Signed)
The prescription was sent as a multi month supply, if the pharmacy only dispensed a 1 month supply it is likely due to insurance rules that limit dispenses to 1 month at a time.

## 2023-11-11 ENCOUNTER — Telehealth: Payer: Self-pay

## 2023-11-11 NOTE — Telephone Encounter (Signed)
Called patient she is approved for the [facility] 09/09/2023 she has to call her insurance company because she was also approved for Saxenda 09/05/2023 she has to informed them that she wants [facility]

## 2023-11-11 NOTE — Telephone Encounter (Signed)
Copied from CRM [id number]. Topic: Clinical - Prescription Issue >> Nov 11, 2023 10:55 AM Ivette P wrote: Reason for CRM: Pt called in and stated that she went to go pick up prescription and still showing no pre approval. Doctors office needs to call insurance to get approval for the New prescription. Pt is requesting callback for further explanation [phone]

## 2023-11-11 NOTE — Telephone Encounter (Signed)
Patient is approved for the [facility] 09/05/2023

## 2023-11-11 NOTE — Telephone Encounter (Signed)
Called pt LVM to contact the office

## 2023-11-15 NOTE — Telephone Encounter (Signed)
April did the PA for this patient

## 2023-11-15 NOTE — Telephone Encounter (Signed)
It looks like we just received the request for the PA today, are you able to take care of that and cover my meds?

## 2023-11-24 ENCOUNTER — Encounter
Payer: Federal, State, Local not specified - PPO | Attending: Physical Medicine and Rehabilitation | Admitting: Physical Medicine and Rehabilitation

## 2023-11-24 ENCOUNTER — Encounter: Payer: Self-pay | Admitting: Physical Medicine and Rehabilitation

## 2023-11-24 VITALS — BP 113/67 | HR 78 | Ht 65.0 in | Wt 265.0 lb

## 2023-11-24 DIAGNOSIS — R0602 Shortness of breath: Secondary | ICD-10-CM | POA: Diagnosis present

## 2023-11-24 DIAGNOSIS — G894 Chronic pain syndrome: Secondary | ICD-10-CM | POA: Diagnosis present

## 2023-11-24 DIAGNOSIS — M79605 Pain in left leg: Secondary | ICD-10-CM

## 2023-11-24 DIAGNOSIS — M47819 Spondylosis without myelopathy or radiculopathy, site unspecified: Secondary | ICD-10-CM | POA: Diagnosis present

## 2023-11-24 DIAGNOSIS — M79604 Pain in right leg: Secondary | ICD-10-CM | POA: Diagnosis present

## 2023-11-24 DIAGNOSIS — G4700 Insomnia, unspecified: Secondary | ICD-10-CM | POA: Diagnosis present

## 2023-11-24 NOTE — Addendum Note (Signed)
 Addended by: Horton Chin on: 11/24/2023 10:26 AM   Modules accepted: Orders

## 2023-11-24 NOTE — Addendum Note (Signed)
 Addended by: Horton Chin on: 11/24/2023 10:36 AM   Modules accepted: Orders

## 2023-11-24 NOTE — Progress Notes (Addendum)
 Subjective:    Patient ID: Pamela Esparza, female    DOB: 07/26/1966, 58 y.o.   MRN: [id number]  HPI Pamela Esparza is a 58 year old woman who presents to establish care for leg pain  1) Leg pain: -radiates from the back -has leg swelling  -she has been diagnosed with sciatica and herniated disc -she gets steroid injections in [facility] and these help with the back pain -this has happened since 2012  2) Insomnia: -she takes flexeril and this helps her sleep  Pain Inventory Average Pain 5 Pain Right Now 6 My pain is constant, sharp, burning, dull, stabbing, tingling, and aching  In the last 24 hours, has pain interfered with the following? General activity 8 Relation with others 0 Enjoyment of life 6 What TIME of day is your pain at its worst? morning , daytime, evening, night, and varies Sleep (in general) Fair  Pain is worse with: walking, bending, sitting, inactivity, standing, and some activites Pain improves with: rest, heat/ice, therapy/exercise, pacing activities, medication, TENS, and injections Relief from Meds: 8  walk with assistance use a cane use a walker ability to climb steps?  yes do you drive?  yes Do you have any goals in this area?  yes  disabled: date disabled 2013? I need assistance with the following:  dressing, bathing, meal prep, household duties, and shopping Do you have any goals in this area?  yes  bladder control problems bowel control problems weakness numbness tremor tingling trouble walking spasms dizziness confusion depression anxiety  Any changes since last visit?  no  Any changes since last visit?  no    Family History  Problem Relation Age of Onset   Breast cancer Mother 73       second cancer at 57   Lung cancer Mother 59   Arthritis Mother    Cancer Mother    COPD Mother    Heart attack Father    Hypertension Father    Diabetes Father    Bladder Cancer Father 36   Arthritis Father    Asthma Father     Cancer Father    COPD Father    Heart disease Father    Stroke Father    Vision loss Father    Lung cancer Maternal Aunt    Cancer Maternal Aunt    Brain cancer Paternal Uncle    Thyroid disease Paternal Uncle    Birth defects Maternal Grandmother        ovarian   Depression Maternal Grandmother    Uterine cancer Maternal Grandmother        d. 73s   Cancer Maternal Grandmother    Brain cancer Paternal Grandmother    Cancer Paternal Grandmother    Obesity Paternal Grandmother    ADD / ADHD Son    Cancer Paternal Uncle    Early death Paternal Uncle    Obesity Paternal Uncle    Colon cancer Neg Hx    Colon polyps Neg Hx    Esophageal cancer Neg Hx    Stomach cancer Neg Hx    Rectal cancer Neg Hx    Social History   Socioeconomic History   Marital status: Married    Spouse name: Not on file   Number of children: Not on file   Years of education: Not on file   Highest education level: Associate degree: occupational, Scientist, product/process development, or vocational program  Occupational History   Not on file  Tobacco Use   Smoking status:  Never    Passive exposure: Never   Smokeless tobacco: Never   Tobacco comments:    Never smoked  Vaping Use   Vaping status: Never Used  Substance and Sexual Activity   Alcohol use: Not Currently    Comment: 4 drinks a year-rare   Drug use: Never   Sexual activity: Not Currently    Comment: Total hysterectomy  Other Topics Concern   Not on file  Social History Narrative   Not on file   Social Drivers of Health   Financial Resource Strain: Low Risk  (08/02/2023)   Overall Financial Resource Strain (CARDIA)    Difficulty of Paying Living Expenses: Not very hard  Food Insecurity: No Food Insecurity (08/02/2023)   Hunger Vital Sign    Worried About Running Out of Food in the Last Year: Never true    Ran Out of Food in the Last Year: Never true  Transportation Needs: No Transportation Needs (08/02/2023)   PRAPARE - Scientist, research (physical sciences) (Medical): No    Lack of Transportation (Non-Medical): No  Physical Activity: Inactive (08/02/2023)   Exercise Vital Sign    Days of Exercise per Week: 0 days    Minutes of Exercise per Session: 0 min  Stress: No Stress Concern Present (08/02/2023)   [employer] of Occupational Health - Occupational Stress Questionnaire    Feeling of Stress : Only a little  Social Connections: Moderately Integrated (08/02/2023)   Social Connection and Isolation Panel [NHANES]    Frequency of Communication with Friends and Family: More than three times a week    Frequency of Social Gatherings with Friends and Family: Never    Attends Religious Services: 1 to 4 times per year    Active Member of [employer] or Organizations: No    Attends Banker Meetings: Never    Marital Status: Married   Past Surgical History:  Procedure Laterality Date   ANKLE SURGERY Right    APPENDECTOMY     CHOLECYSTECTOMY     ESOPHAGOGASTRODUODENOSCOPY (EGD) WITH PROPOFOL N/A 12/05/2020   Procedure: ESOPHAGOGASTRODUODENOSCOPY (EGD) WITH PROPOFOL;  Surgeon: Pasty Spillers, MD;  Location: [facility];  Service: Endoscopy;  Laterality: N/A;   finger sugery     NECK SURGERY     SPINE SURGERY     On my neck   TENDON TRANSFER Right 11/06/2019   Procedure: PERONEAL RECONSTRUCTION WITH PERONEAL LONGUS RIGHT LEG;  Surgeon: Nadara Mustard, MD;  Location: [facility];  Service: Orthopedics;  Laterality: Right;   TOTAL ABDOMINAL HYSTERECTOMY     TUBAL LIGATION N/A    Phreesia 10/13/2020   Past Medical History:  Diagnosis Date   Allergy    Topiramate, Lyrica, aspirin (sensitive to)   Anxiety    Arthritis    Asthma    Cataract    DDD (degenerative disc disease), lumbar    Depression    Depression    Phreesia 10/13/2020   Family history of bladder cancer    Family history of brain cancer 02/15/2023   Fibromyalgia    Hyperlipidemia    Joint pain    Neuromuscular disorder (HCC)     Phreesia 10/13/2020   Neuropathy    Osteoporosis    Sjogren's disease (HCC)    Thyroid disease    BP 113/67   Pulse 78   Ht 5\' 5"  (1.651 m)   Wt 265 lb (120.2 kg)   SpO2 98%   BMI 44.10 kg/m  Opioid Risk Score:   Fall Risk Score:  `1  Depression screen [facility] 2/9     11/24/2023    9:54 AM 11/09/2023   10:35 AM 08/24/2023    2:53 PM 08/03/2023    2:27 PM 05/24/2023    1:41 PM 03/23/2023   10:36 AM 03/02/2023    2:35 PM  Depression screen PHQ 2/9  Decreased Interest 0 1 0 2 1 0 2  Down, Depressed, Hopeless 0 0 0 0 1 0 1  PHQ - 2 Score 0 1 0 2 2 0 3  Altered sleeping 0 1 0 2 2 1 2   Tired, decreased energy 0 2 0 3 3 1 3   Change in appetite 0 0 0 0 0 0 2  Feeling bad or failure about yourself  0 0 0 0 0 0 0  Trouble concentrating 0 2 3 2 3 3 3   Moving slowly or fidgety/restless 0 0 0 0 0 0 0  Suicidal thoughts 0 0 0 0 0 0 0  PHQ-9 Score 0 6 3 9 10 5 13   Difficult doing work/chores  Somewhat difficult Not difficult at all Very difficult Very difficult Very difficult Extremely dIfficult    Review of Systems  Gastrointestinal:  Positive for constipation.  Genitourinary:  Positive for frequency.  Musculoskeletal:  Positive for gait problem.       Spasms  Neurological:  Positive for weakness and numbness.  Psychiatric/Behavioral:  Positive for confusion.        Depression, anxiety  All other systems reviewed and are negative.     Objective:   Physical Exam Gen: no distress, normal appearing HEENT: oral mucosa pink and moist, NCAT Cardio: Reg rate Chest: normal effort, normal rate of breathing Abd: soft, non-distended Ext: no edema Psych: pleasant, normal affect Skin: intact Neuro: Alert and oriented x3     Assessment & Plan:   1) Chronic Pain Syndrome secondary to leg pain: -discussed that she had EMG/[place] testing and this was told to her to be normal -discussed that she used to be strong -discussed that she avoids processed foods -Discussed current symptoms of  pain and history of pain.  -Discussed benefits of exercise in reducing pain. -Discussed following foods that may reduce pain: 1) Ginger (especially studied for arthritis)- reduce leukotriene production to decrease inflammation 2) Blueberries- high in phytonutrients that decrease inflammation 3) Salmon- marine omega-3s reduce joint swelling and pain 4) Pumpkin seeds- reduce inflammation 5) dark chocolate- reduces inflammation 6) turmeric- reduces inflammation 7) tart cherries - reduce pain and stiffness 8) extra virgin olive oil - its compound olecanthal helps to block prostaglandins  9) chili peppers- can be eaten or applied topically via capsaicin 10) mint- helpful for headache, muscle aches, joint pain, and itching 11) garlic- reduces inflammation   2) Insomnia: -Try to go outside near sunrise -Get exercise during the day.  -Turn off all devices an hour before bedtime.  -Teas that can benefit: chamomile, valerian root, Brahmi (Bacopa) -Can consider over the counter melatonin, magnesium, and/or L-theanine. Melatonin is an anti-oxidant with multiple health benefits. Magnesium is involved in greater than 300 enzymatic reactions in the body and most of [place] are deficient as our soil is often depleted. There are 7 different types of magnesium- Bioptemizer's is a supplement with all 7 types, and each has unique benefits. Magnesium can also help with constipation and anxiety.  -Pistachios naturally increase the production of melatonin -Cozy Earth bamboo bed sheets are free from toxic chemicals.  -Tart  cherry juice or a tart cherry supplement can improve sleep and soreness post-workout    Link to further information on diet for chronic pain: [email or web address].[email or web address]    3) Facet arthropathy: -discussed that she has had medial branch blocks  4) Constipation:  -Provided list of following foods that help with constipation and  highlighted a few: 1) prunes- contain high amounts of fiber.  2) apples- has a form of dietary fiber called pectin that accelerates stool movement and increases beneficial gut bacteria 3) pears- in addition to fiber, also high in fructose and sorbitol which have laxative effect 4) figs- contain an enzyme ficin which helps to speed colonic transit 5) kiwis- contain an enzyme actinidin that improves gut motility and reduces constipation 6) oranges- rich in pectin (like apples) 7) grapefruits- contain a flavanol naringenin which has a laxative effect 8) vegetables- rich in fiber and also great sources of folate, vitamin C, and K 9) artichoke- high in inulin, prebiotic great for the microbiome 10) chicory- increases stool frequency and softness (can be added to coffee) 11) rhubarb- laxative effect 12) sweet potato- high fiber 13) beans, peas, and lentils- contain both soluble and insoluble fiber 14) chia seeds- improves intestinal health and gut flora 15) flaxseeds- laxative effect 16) whole grain rye bread- high in fiber 17) oat bran- high in soluble and insoluble fiber 18) kefir- softens stools -recommended to try at least one of these foods every day.  -drink 6-8 glasses of water per day -walk regularly, especially after meals.   5) Shortness of breath: -discussed that she had a thorough eval and her heart is working well

## 2023-11-24 NOTE — Patient Instructions (Addendum)
 Foods that may reduce pain: 1) Ginger (especially studied for arthritis)- reduce leukotriene production to decrease inflammation 2) Blueberries- high in phytonutrients that decrease inflammation 3) Salmon- marine omega-3s reduce joint swelling and pain 4) Pumpkin seeds- reduce inflammation 5) dark chocolate- reduces inflammation 6) turmeric- reduces inflammation 7) tart cherries - reduce pain and stiffness 8) extra virgin olive oil - its compound olecanthal helps to block prostaglandins  9) chili peppers- can be eaten or applied topically via capsaicin 10) mint- helpful for headache, muscle aches, joint pain, and itching 11) garlic- reduces inflammation  Link to further information on diet for chronic pain: [email or web address]   Turmeric to reduce inflammation--can be used in cooking or taken as a supplement.  Benefits of turmeric:  -Highly anti-inflammatory  -Increases antioxidants  -Improves memory, attention, brain disease  -Lowers risk of heart disease  -May help prevent cancer  -Decreases pain  -Alleviates depression  -Delays aging and decreases risk of chronic disease  -Consume with black pepper to increase absorption    Turmeric Milk Recipe:  1 cup milk  1 tsp turmeric  1 tsp cinnamon  1 tsp grated ginger (optional)  Black pepper (boosts the anti-inflammatory properties of turmeric).  1 tsp honey   Insomnia: -Try to go outside near sunrise -Get exercise during the day.  -Turn off all devices an hour before bedtime.  -Teas that can benefit: chamomile, valerian root, Brahmi (Bacopa) -Can consider over the counter melatonin, magnesium, and/or L-theanine. Melatonin is an anti-oxidant with multiple health benefits. Magnesium is involved in greater than 300 enzymatic reactions in the body and most of [place] are deficient as our soil is often depleted. There are 7 different types of  magnesium- Bioptemizer's is a supplement with all 7 types, and each has unique benefits. Magnesium can also help with constipation and anxiety.  -Pistachios naturally increase the production of melatonin -Cozy Earth bamboo bed sheets are free from toxic chemicals.  -Tart cherry juice or a tart cherry supplement can improve sleep and soreness post-workout    Most absorbable form is magnesium glycinate 250mg  HS  Constipation:  -Provided list of following foods that help with constipation and highlighted a few: 1) prunes- contain high amounts of fiber.  2) apples- has a form of dietary fiber called pectin that accelerates stool movement and increases beneficial gut bacteria 3) pears- in addition to fiber, also high in fructose and sorbitol which have laxative effect 4) figs- contain an enzyme ficin which helps to speed colonic transit 5) kiwis- contain an enzyme actinidin that improves gut motility and reduces constipation 6) oranges- rich in pectin (like apples) 7) grapefruits- contain a flavanol naringenin which has a laxative effect 8) vegetables- rich in fiber and also great sources of folate, vitamin C, and K 9) artichoke- high in inulin, prebiotic great for the microbiome 10) chicory- increases stool frequency and softness (can be added to coffee) 11) rhubarb- laxative effect 12) sweet potato- high fiber 13) beans, peas, and lentils- contain both soluble and insoluble fiber 14) chia seeds- improves intestinal health and gut flora 15) flaxseeds- laxative effect 16) whole grain rye bread- high in fiber 17) oat bran- high in soluble and insoluble fiber 18) kefir- softens stools -recommended to try at least one of these foods every day.  -drink 6-8 glasses of water per day -walk regularly, especially after meals.      Celery juice  Wahls protocol

## 2023-11-30 ENCOUNTER — Encounter: Payer: Self-pay | Admitting: Family Medicine

## 2023-11-30 DIAGNOSIS — E66813 Obesity, class 3: Secondary | ICD-10-CM

## 2023-12-01 MED ORDER — WEGOVY 0.25 MG/0.5ML ~~LOC~~ SOAJ: 0.2500 mg | SUBCUTANEOUS | 0 refills | Status: DC

## 2023-12-12 ENCOUNTER — Other Ambulatory Visit: Payer: Self-pay | Admitting: Family Medicine

## 2023-12-12 MED ORDER — WEGOVY 0.5 MG/0.5ML ~~LOC~~ SOAJ: 0.5000 mg | SUBCUTANEOUS | 3 refills | Status: DC

## 2023-12-16 ENCOUNTER — Encounter: Attending: Family Medicine | Admitting: Dietician

## 2023-12-16 ENCOUNTER — Encounter: Payer: Self-pay | Admitting: Dietician

## 2023-12-16 DIAGNOSIS — Z713 Dietary counseling and surveillance: Secondary | ICD-10-CM | POA: Insufficient documentation

## 2023-12-16 DIAGNOSIS — Z6841 Body Mass Index (BMI) 40.0 and over, adult: Secondary | ICD-10-CM | POA: Diagnosis not present

## 2023-12-16 DIAGNOSIS — E66813 Obesity, class 3: Secondary | ICD-10-CM | POA: Insufficient documentation

## 2023-12-16 NOTE — Patient Instructions (Addendum)
 Goal: do chair exercise 2-3 times a week.  Goal: eat non-starchy vegetables with lunch or dinner (or both!).   Recipe Websites  [email or web address]  [email or web address]  [email or web address]

## 2023-12-16 NOTE — Progress Notes (Signed)
 Medical Nutrition Therapy  Appointment Start time:  [phone]  Appointment End time:  0945  Primary concerns today: understanding food/nutrition, continuing weight loss   Referral diagnosis: E66.01 Preferred learning style: no preference indicated Learning readiness: ready   NUTRITION ASSESSMENT   Anthropometrics   Wt 12/16/23: 266 lb  Clinical Medical Hx: allergies, anxiety, arthritis, asthma, cataracts, depression, HLD, osteoporosis, thyroid disease, (history of prediabetes but most recent A1c 5.5%), fibromyalgia Medications: levothyroxine Labs: reviewed Notable Signs/Symptoms: chronic pain Food Allergies: none reported  Lifestyle & Dietary Hx  Pt reports she just recently started a health program that is included with her insurance which includes access to a health coach and being sent a digital scale that connects with your phone.   Pt states she was on [place] prior, but the cost changed and so she has not been on it recently. Pt reports she was 300 lb before starting Wegovy and lost to 259/260 lb over 4 months. Pt states since getting off Wegovy she has maintained weight but been unable to lose more.   Pt reports the last month has been high stress for her due to her husband being hospitalized for a month. Pt reports her husband and 35 year old son live in the home. Pt states she notices when she is more stressed she eats more sugary foods. Pt reports for self care she enjoys sitting outside, painting, and crochet.   Pt states due to fibromyalgia she is often in pain. Pt reports this makes daily tasks difficult sometimes such as cooking, thus she enjoys cooking crockpot style meals and recently bought containers to be able to freeze leftovers. Pt states her husband also cooks but states he tends to cook more unhealthy meals so she may just have cottage cheese and fruit instead of what he cooks.   Pt reports she used to enjoy hiking and being outdoors but due to her health issues is  unable to do as much exercise but states she just got a book about chair exercises and plans to start next week.   Estimated daily fluid intake: 128 oz Supplements: probiotic, CO-Q-10, MVI, vitamin D, magnesium at night Sleep: 5-8 hours Stress / self-care: moderate-to-high stress (more recent due to husbands Current average weekly physical activity: ADLs  24-Hr Dietary Recall First Meal: belvita Snack: none OR greek yogurt Second Meal: cottage cheese OR 'clean' protein bar Snack: greek yogurt (oikos triple zero) Third Meal: cottage cheese and pineapple Snack: none OR blueberries Beverages: water, unsweet tea,    NUTRITION DIAGNOSIS  NB-1.1 Food and nutrition-related knowledge deficit As related to lack of prior education by a registered dietitian.  As evidenced by pt report.   NUTRITION INTERVENTION  Nutrition education (E-1) on the following topics:   Plate Method Fruits & Vegetables: Aim to fill half your plate with a variety of fruits and vegetables. They are rich in vitamins, minerals, and fiber, and can help reduce the risk of chronic diseases. Choose a colorful assortment of fruits and vegetables to ensure you get a wide range of nutrients. Grains and Starches: Make at least half of your grain choices whole grains, such as brown rice, whole wheat bread, and oats. Whole grains provide fiber, which aids in digestion and healthy cholesterol levels. Aim for whole forms of starchy vegetables such as potatoes, sweet potatoes, beans, peas, and corn, which are fiber rich and provide many vitamins and minerals.  Protein: Incorporate lean sources of protein, such as poultry, fish, beans, nuts, and seeds, into your  meals. Protein is essential for building and repairing tissues, staying full, balancing blood sugar, as well as supporting immune function. Dairy: Include low-fat or fat-free dairy products like milk, yogurt, and cheese in your diet. Dairy foods are excellent sources of calcium and  vitamin D, which are crucial for bone health.   Exercise Finding an exercise you enjoy is crucial for maintaining long-term fitness and overall health. Enjoyable activities are more likely to become regular habits, making it easier to stay consistent with physical activity. When you look forward to your workouts, exercise becomes a positive experience rather than a chore, reducing the likelihood of burnout or quitting. Enjoyable exercise also enhances mental well-being, as engaging in activities you love can boost mood, reduce stress, and provide a sense of accomplishment.   Handouts Provided Include  Plate Method  Learning Style & Readiness for Change Teaching method utilized: Visual & Auditory  Demonstrated degree of understanding via: Teach Back  Barriers to learning/adherence to lifestyle change: none  Goals Established by Pt  Goal: do chair exercise 2-3 times a week.  Goal: eat non-starchy vegetables with lunch or dinner (or both!).   Recipe Websites  [email or web address]  [email or web address]  [email or web address]   MONITORING & EVALUATION Dietary intake, weekly physical activity, and follow up in 5 months.  Next Steps  Patient is to call for questions.

## 2023-12-23 ENCOUNTER — Telehealth (HOSPITAL_BASED_OUTPATIENT_CLINIC_OR_DEPARTMENT_OTHER): Payer: Self-pay | Admitting: Physical Therapy

## 2023-12-23 NOTE — Telephone Encounter (Signed)
 Called and spoke to patient to remind patient of upcoming physical therapy evaluation appointment. Pt confirmed appt and will be in attendance.

## 2023-12-26 ENCOUNTER — Other Ambulatory Visit: Payer: Self-pay

## 2023-12-26 ENCOUNTER — Ambulatory Visit (HOSPITAL_BASED_OUTPATIENT_CLINIC_OR_DEPARTMENT_OTHER)
Payer: Federal, State, Local not specified - PPO | Attending: Physical Medicine and Rehabilitation | Admitting: Physical Therapy

## 2023-12-26 ENCOUNTER — Encounter (HOSPITAL_BASED_OUTPATIENT_CLINIC_OR_DEPARTMENT_OTHER): Payer: Self-pay | Admitting: Physical Therapy

## 2023-12-26 DIAGNOSIS — G894 Chronic pain syndrome: Secondary | ICD-10-CM | POA: Insufficient documentation

## 2023-12-26 DIAGNOSIS — M5459 Other low back pain: Secondary | ICD-10-CM | POA: Diagnosis present

## 2023-12-26 DIAGNOSIS — M6281 Muscle weakness (generalized): Secondary | ICD-10-CM | POA: Diagnosis present

## 2023-12-26 DIAGNOSIS — M47819 Spondylosis without myelopathy or radiculopathy, site unspecified: Secondary | ICD-10-CM | POA: Insufficient documentation

## 2023-12-26 DIAGNOSIS — R2681 Unsteadiness on feet: Secondary | ICD-10-CM | POA: Insufficient documentation

## 2023-12-26 NOTE — Therapy (Signed)
 OUTPATIENT PHYSICAL THERAPY THORACOLUMBAR EVALUATION   Patient Name: Pamela Esparza MRN: [id number] DOB:01-29-1966, 58 y.o., female Today's Date: 12/26/2023  END OF SESSION:  PT End of Session - 12/26/23 1059     Visit Number 1    Number of Visits 16    Date for PT Re-Evaluation 02/24/24    Authorization Type [employer]    PT Start Time 0932    PT Stop Time 1015    PT Time Calculation (min) 43 min    Activity Tolerance Patient tolerated treatment well;Patient limited by pain    Behavior During Therapy [facility] for tasks assessed/performed             Past Medical History:  Diagnosis Date   Allergy    Topiramate, Lyrica, aspirin (sensitive to)   Anxiety    Arthritis    Asthma    Cataract    DDD (degenerative disc disease), lumbar    Depression    Depression    Phreesia 10/13/2020   Family history of bladder cancer    Family history of brain cancer 02/15/2023   Fibromyalgia    Hyperlipidemia    Joint pain    Neuromuscular disorder (HCC)    Phreesia 10/13/2020   Neuropathy    Osteoporosis    Sjogren's disease (HCC)    Thyroid disease    Past Surgical History:  Procedure Laterality Date   ANKLE SURGERY Right    APPENDECTOMY     CHOLECYSTECTOMY     ESOPHAGOGASTRODUODENOSCOPY (EGD) WITH PROPOFOL N/A 12/05/2020   Procedure: ESOPHAGOGASTRODUODENOSCOPY (EGD) WITH PROPOFOL;  Surgeon: Pasty Spillers, MD;  Location: [facility];  Service: Endoscopy;  Laterality: N/A;   finger sugery     NECK SURGERY     SPINE SURGERY     On my neck   TENDON TRANSFER Right 11/06/2019   Procedure: PERONEAL RECONSTRUCTION WITH PERONEAL LONGUS RIGHT LEG;  Surgeon: Nadara Mustard, MD;  Location: [facility];  Service: Orthopedics;  Laterality: Right;   TOTAL ABDOMINAL HYSTERECTOMY     TUBAL LIGATION N/A    Phreesia 10/13/2020   Patient Active Problem List   Diagnosis Date Noted   Polyp of colon 01/27/2023   Hyperlipidemia 04/28/2022   Pituitary microadenoma (HCC)  09/14/2021   Primary osteoarthritis 09/03/2021   Sjogren's disease (HCC) 04/16/2021   Spondylosis of lumbar region without myelopathy or radiculopathy 02/04/2021   Essential (primary) hypertension 02/02/2021   Class 3 severe obesity due to excess calories with serious comorbidity and body mass index (BMI) of 45.0 to 49.9 in adult (HCC) 02/02/2021   Chronic pain syndrome 01/12/2021   Peroneal tendinitis, right leg    Multiple atypical nevi 08/14/2019   Haglund's deformity of right heel 06/25/2019   Hypothyroidism 03/21/2019   Chronic joint pain 10/23/2018   DDD (degenerative disc disease), lumbar 10/23/2018   Muscle pain, fibromyalgia 10/23/2018   Lumbosacral spondylosis without myelopathy 08/20/2015   Neuropathy 07/11/2013    PCP: Saralyn Pilar PA  REFERRING PROVIDER: Horton Chin, MD   REFERRING DIAG:  G89.4 (ICD-10-CM) - Chronic pain syndrome  M47.819 (ICD-10-CM) - Facet arthropathy    Rationale for Evaluation and Treatment: Rehabilitation  THERAPY DIAG:  Other low back pain  Muscle weakness (generalized)  Unsteadiness on feet  ONSET DATE: 2013  SUBJECTIVE:  SUBJECTIVE STATEMENT: 2 back surgeries in neck and LB.  I am having problems with both of my legs if I walk to long my legs feel like they are going to give on me. Muscle will shake and feel weak.  I will have radiating pain down both legs to feet.  Grocery shopping makes it hurt.  I come home and lie down for about an hour and it gets better. I have permanent muscle atrophy in back from a prior injury. Waking x 2 night at least.  I am off balance constantly.  I don't trust my head I will get dizzy, my balance is bad  PERTINENT HISTORY:  Facet arthropathy; insomnia; chronic Pain Syndrome Pituitary Microadenoma  PAIN:  Are  you having pain? Yes: NPRS scale: current 7/10; least 6/10; worst 10 Pain location: Lumbar spine with radiation R>L into feet Pain description: ache, P&N, sharp constant Aggravating factors: movement, walk ~ 30 minutes; standing <10 minutes Relieving factors: trigger point shots, ablations at least 1 x year,  PRECAUTIONS: Fall  RED FLAGS: None   WEIGHT BEARING RESTRICTIONS: No  FALLS:  Has patient fallen in last 6 months? Yes. Number of falls 2 legs give out.  LIVING ENVIRONMENT: Lives with: lives with their family, lives with their spouse, and lives with their son Lives in: House/apartment Stairs: Yes: External: 9 steps; on right going up Has following equipment at home: Single point cane, Walker - 2 wheeled, and scooter; tub bench; grab bars  OCCUPATION: disability x 10 yrs  PLOF: Requires assistive device for independence  PATIENT GOALS: decrease pain, improve leg strength, improve balance  NEXT MD VISIT: every 3 months  OBJECTIVE:  Note: Objective measures were completed at Evaluation unless otherwise noted.  DIAGNOSTIC FINDINGS:  None relevant in chart  PATIENT SURVEYS:  Modified Oswestry 56%   COGNITION: Overall cognitive status: Within functional limits for tasks assessed     SENSATION: WFL  MUSCLE LENGTH: Hamstrings: tightness bilaterally tested in sitting  POSTURE: rounded shoulders and forward head  PALPATION: TTP lumbar paraspinals and into bilateral hips to trochanter snd sciatic notches.  LUMBAR ROM:   AROM eval  Flexion FT to below patella  Extension full  Right lateral flexion 50% limited  Left lateral flexion 50% limited  Right rotation   Left rotation    (Blank rows = not tested)  LOWER EXTREMITY ROM:     wfl  LOWER EXTREMITY MMT:    MMT Right eval Left eval  Hip flexion 29.4 33.3  Hip extension    Hip abduction 22.8 29.4  Hip adduction    Hip internal rotation    Hip external rotation    Knee flexion    Knee extension  22.9 16.9  Ankle dorsiflexion    Ankle plantarflexion    Ankle inversion    Ankle eversion     (Blank rows = not tested)  LUMBAR SPECIAL TESTS:  Slump test: Positive  FUNCTIONAL TESTS:  Timed up and go (TUG): 15.07 complete with cane BERG Balance Test          Date: 12/26/23  Sit to Stand 2  Standing unsupported 3  Sitting with back unsupported but feet supported 4  Stand to sit  2  Transfers  3  Standing unsupported with eyes closed 2  Standing unsupported feet together 2  From standing position, reach forward with outstretched arm 3  From standing position, pick up object from floor 1  From standing position, turn and look behind over each  shoulder 1  Turn 360 1  Standing unsupported, alternately place foot on step 0  Standing unsupported, one foot in front 0  Standing on one leg 0  Total:  24/56     GAIT: Distance walked: 400 ft Assistive device utilized: Single point cane Level of assistance:  requires ad safety Comments: slow cadence and decreased step length, guarded position  TREATMENT  Eval Self-care: Pt instruction on using cane vs rollator vs scooter in home and out; safety and proper equipment in bathroom/shower;                                                                                                                             PATIENT EDUCATION:  Education details: Discussed eval findings, rehab rationale, aquatic program progression/POC and pools in area. Patient is in agreement  Person educated: Patient Education method: Explanation Education comprehension: verbalized understanding  HOME EXERCISE PROGRAM: TBA  ASSESSMENT:  CLINICAL IMPRESSION: Patient is a 58 y.o. f who was seen today for physical therapy evaluation and treatment for chronic pain syndrome/LBP.  She presents today amb with cane.  She is limited with all functional mobility and ADL's.  Uses shower bench to bath, unable to tolerate standing and cooking/cleaning.  Difficulty  getting through grocery stores.  She has constant pain in LB with radicular issues bilaterally to feet. NCV le completed with normal results. Pain and nerve deficits likely multifactorial (see PmHx). She tests positive for bilateral le strength deficits, balance dysfunction/high fall risk and limited due to pain with all activities She will benefit from skilled PT intervention to improve management of chronic pain/conditions and improve safety with all ADL's and functional mobility.  Will bgin in aquatics where she will benefit from the properties of water to progress towards stated goals then will transition onto land based at able/tolerated.  OBJECTIVE IMPAIRMENTS: Abnormal gait, decreased activity tolerance, decreased balance, decreased endurance, decreased mobility, difficulty walking, decreased strength, improper body mechanics, postural dysfunction, obesity, and pain.   ACTIVITY LIMITATIONS: carrying, lifting, bending, sitting, standing, squatting, sleeping, stairs, transfers, bathing, and locomotion level  PARTICIPATION LIMITATIONS: meal prep, cleaning, laundry, driving, shopping, community activity, and yard work  PERSONAL FACTORS: Time since onset of injury/illness/exacerbation and 3+ comorbidities: see PmHx  are also affecting patient's functional outcome.   REHAB POTENTIAL: Good  CLINICAL DECISION MAKING: Unstable/unpredictable  EVALUATION COMPLEXITY: High   GOALS: Goals reviewed with patient? Yes  SHORT TERM GOALS: Target date: 01/15/24  Pt will tolerate full aquatic sessions consistently without increase in pain and with improving function to demonstrate good toleration and effectiveness of intervention.  Baseline: Goal status: INITIAL  2.  Pt will tolerate walking to and from setting (500 ft each way) and full aquatic session without excessive fatigue or pain Baseline: TBA Goal status: INITIAL  3.  Pt will report a decrease in pain while submerged to 5/10 or less with  residual relief for 2 hours after session Baseline: TBA Goal status: INITIAL  LONG TERM GOALS: Target date: 02/24/24  Pt to improve on ODI to 43% (MCID) to demonstrate statistically significant Improvement in function. Baseline: 28/50+56% Goal status: INITIAL  2.  .Pt will improve strength in all areas listed by up towards 10 LB or > to demonstrate improved overall physical function Baseline: see chart Goal status: INITIAL  3.  Pt will improve on Tug test to <or=  13s to demonstrate improvement in lower extremity function, mobility and decreased fall risk. Baseline:  Goal status: INITIAL  4.  Pt will be indep with final HEP's (land and aquatic as appropriate) for continued management of condition Baseline: none Goal status: INITIAL  5.  Pt will improve on Berg balance test to >/= 42/56 to demonstrate a decrease in fall risk. Baseline: 24/56 Goal status: INITIAL  6.  Pt will report decrease in pain by at least 50% for improved toleration to activity/quality of life and to demonstrate improved management of pain. Baseline: see chart Goal status: INITIAL  PLAN:  PT FREQUENCY: 2x/week  PT DURATION: 8 weeks  PLANNED INTERVENTIONS: 97164- PT Re-evaluation, 97110-Therapeutic exercises, 97530- Therapeutic activity, 97112- Neuromuscular re-education, 97535- Self Care, 40981- Manual therapy, [phone]- Gait training, [phone]- Orthotic Fit/training, [phone]- Aquatic Therapy, [phone]- Electrical stimulation (unattended), [phone]- Ionotophoresis 4mg /ml Dexamethasone, Patient/Family education, Balance training, Stair training, Taping, Dry Needling, Joint mobilization, DME instructions, Cryotherapy, and Moist heat.  PLAN FOR NEXT SESSION: aquatic- 6 weeks onlyt: ROM/strengthening, stretching, pain management, toleration to activity, instruction on benefits of aquatic exercise. Land HEP without using exercise machines for core and LE.   [address] [place]) Ronica Rosine MPT 12/26/23 11:01 AM [facility] [address] [place], [place], [address] Phone: [phone]   Fax:  [phone]

## 2023-12-27 ENCOUNTER — Ambulatory Visit (HOSPITAL_BASED_OUTPATIENT_CLINIC_OR_DEPARTMENT_OTHER): Attending: Physical Medicine and Rehabilitation | Admitting: Physical Therapy

## 2023-12-27 ENCOUNTER — Encounter (HOSPITAL_BASED_OUTPATIENT_CLINIC_OR_DEPARTMENT_OTHER): Payer: Self-pay | Admitting: Physical Therapy

## 2023-12-27 DIAGNOSIS — R2681 Unsteadiness on feet: Secondary | ICD-10-CM | POA: Diagnosis present

## 2023-12-27 DIAGNOSIS — M6281 Muscle weakness (generalized): Secondary | ICD-10-CM | POA: Insufficient documentation

## 2023-12-27 DIAGNOSIS — M5459 Other low back pain: Secondary | ICD-10-CM | POA: Insufficient documentation

## 2023-12-27 NOTE — Therapy (Signed)
 OUTPATIENT PHYSICAL THERAPY THORACOLUMBAR TREATMENT   Patient Name: Pamela Esparza MRN: [id number] DOB:Mar 20, 1966, 58 y.o., female Today's Date: 12/27/2023  END OF SESSION:  PT End of Session - 12/27/23 1545     Visit Number 2    Number of Visits 16    Date for PT Re-Evaluation 02/24/24    Authorization Type BC[employer]    PT Start Time 1533    PT Stop Time 1611    PT Time Calculation (min) 38 min    Activity Tolerance Patient tolerated treatment well    Behavior During Therapy WFL for tasks assessed/performed             Past Medical History:  Diagnosis Date   Allergy    Topiramate, Lyrica, aspirin (sensitive to)   Anxiety    Arthritis    Asthma    Cataract    DDD (degenerative disc disease), lumbar    Depression    Depression    Phreesia 10/13/2020   Family history of bladder cancer    Family history of brain cancer 02/15/2023   Fibromyalgia    Hyperlipidemia    Joint pain    Neuromuscular disorder (HCC)    [employer]reesia 10/13/2020   Neuropathy    Osteoporosis    Sjogren's disease (HCC)    Thyroid disease    Past Surgical History:  Procedure Laterality Date   ANKLE SURGERY Right    APPENDECTOMY     CHOLECYSTECTOMY     ESOPHAGOGASTRODUODENOSCOPY (EGD) WITH PROPOFOL N/A 12/05/2020   Procedure: ESOPHAGOGASTRODUODENOSCOPY (EGD) WITH PROPOFOL;  Surgeon: Pasty Spillers, MD;  Location: [facility];  Service: Endoscopy;  Laterality: N/A;   finger sugery     NECK SURGERY     SPINE SURGERY     On my neck   TENDON TRANSFER Right 11/06/2019   Procedure: PERONEAL RECONSTRUCTION WITH PERONEAL LONGUS RIGHT LEG;  Surgeon: Nadara Mustard, MD;  Location: [facility];  Service: Orthopedics;  Laterality: Right;   TOTAL ABDOMINAL HYSTERECTOMY     TUBAL LIGATION N/A    [employer]reesia 10/13/2020   Patient Active Problem List   Diagnosis Date Noted   Polyp of colon 01/27/2023   Hyperlipidemia 04/28/2022   Pituitary microadenoma (HCC) 09/14/2021   Primary  osteoarthritis 09/03/2021   Sjogren's disease (HCC) 04/16/2021   Spondylosis of lumbar region without myelopathy or radiculopathy 02/04/2021   Essential (primary) hypertension 02/02/2021   Class 3 severe obesity due to excess calories with serious comorbidity and body mass index (BMI) of 45.0 to 49.9 in adult (HCC) 02/02/2021   Chronic pain syndrome 01/12/2021   Peroneal tendinitis, right leg    Multiple atypical nevi 08/14/2019   Haglund's deformity of right heel 06/25/2019   Hypothyroidism 03/21/2019   Chronic joint pain 10/23/2018   DDD (degenerative disc disease), lumbar 10/23/2018   Muscle pain, fibromyalgia 10/23/2018   Lumbosacral spondylosis without myelopathy 08/20/2015   Neuropathy 07/11/2013    PCP: Saralyn Pilar PA  REFERRING PROVIDER: Horton Chin, MD   REFERRING DIAG:  G89.4 (ICD-10-CM) - Chronic pain syndrome  M47.819 (ICD-10-CM) - Facet arthropathy    Rationale for Evaluation and Treatment: Rehabilitation  THERAPY DIAG:  Other low back pain  Muscle weakness (generalized)  Unsteadiness on feet  ONSET DATE: 2013  SUBJECTIVE:  SUBJECTIVE STATEMENT: Pt reports it has been a rough day. Sore everywhere.  Didn't sleep well last night.     Initial Subjective: 2 back surgeries in neck and LB.  I am having problems with both of my legs if I walk to long my legs feel like they are going to give on me. Muscle will shake and feel weak.  I will have radiating pain down both legs to feet.  Grocery shopping makes it hurt.  I come home and lie down for about an hour and it gets better. I have permanent muscle atrophy in back from a prior injury. Waking x 2 night at least.  I am off balance constantly.  I don't trust my head I will get dizzy, my balance is bad  PERTINENT HISTORY:   Facet arthropathy; insomnia; chronic Pain Syndrome Pituitary Microadenoma  PAIN:  Are you having pain? Yes: NPRS scale: current 8/10; least 6/10; worst 10 Pain location: generalized  Pain description: ache, P&N, sharp constant Aggravating factors: movement, walk ~ 30 minutes; standing <10 minutes Relieving factors: trigger point shots, ablations at least 1 x year,  PRECAUTIONS: Fall  RED FLAGS: None   WEIGHT BEARING RESTRICTIONS: No  FALLS:  Has patient fallen in last 6 months? Yes. Number of falls 2 legs give out.  LIVING ENVIRONMENT: Lives with: lives with their family, lives with their spouse, and lives with their son Lives in: House/apartment Stairs: Yes: External: 9 steps; on right going up Has following equipment at home: Single point cane, Walker - 2 wheeled, and scooter; tub bench; grab bars  OCCUPATION: disability x 10 yrs  PLOF: Requires assistive device for independence  PATIENT GOALS: decrease pain, improve leg strength, improve balance  NEXT MD VISIT: every 3 months  OBJECTIVE:  Note: Objective measures were completed at Evaluation unless otherwise noted.  DIAGNOSTIC FINDINGS:  None relevant in chart  PATIENT SURVEYS:  Modified Oswestry 56%   COGNITION: Overall cognitive status: Within functional limits for tasks assessed     SENSATION: WFL  MUSCLE LENGTH: Hamstrings: tightness bilaterally tested in sitting  POSTURE: rounded shoulders and forward head  PALPATION: TTP lumbar paraspinals and into bilateral hips to trochanter snd sciatic notches.  LUMBAR ROM:   AROM eval  Flexion FT to below patella  Extension full  Right lateral flexion 50% limited  Left lateral flexion 50% limited  Right rotation   Left rotation    (Blank rows = not tested)  LOWER EXTREMITY ROM:     wfl  LOWER EXTREMITY MMT:    MMT Right eval Left eval  Hip flexion 29.4 33.3  Hip extension    Hip abduction 22.8 29.4  Hip adduction    Hip internal rotation     Hip external rotation    Knee flexion    Knee extension 22.9 16.9  Ankle dorsiflexion    Ankle plantarflexion    Ankle inversion    Ankle eversion     (Blank rows = not tested)  LUMBAR SPECIAL TESTS:  Slump test: Positive  FUNCTIONAL TESTS:  Timed up and go (TUG): 15.07 complete with cane BERG Balance Test          Date: 12/26/23  Sit to Stand 2  Standing unsupported 3  Sitting with back unsupported but feet supported 4  Stand to sit  2  Transfers  3  Standing unsupported with eyes closed 2  Standing unsupported feet together 2  From standing position, reach forward with outstretched arm 3  From standing  position, pick up object from floor 1  From standing position, turn and look behind over each shoulder 1  Turn 360 1  Standing unsupported, alternately place foot on step 0  Standing unsupported, one foot in front 0  Standing on one leg 0  Total:  24/56     GAIT: Distance walked: 400 ft Assistive device utilized: Single point cane Level of assistance:  requires ad safety Comments: slow cadence and decreased step length, guarded position  TREATMENT  [facility] Adult PT Treatment:                                                DATE: 12/27/23  Pt seen for aquatic therapy today.  Treatment took place in water 3.5-4.75 ft in depth at the [facility] pool. Temp of water was 91.  Pt entered/exited the pool via stairs independently with bilat rail.  - Intro to aquatic therapy principles - UE on barbell: walking forward/ backward x 3 laps - UE on barbell: side stepping x 3 laps; forward marching 1 width; backwards walking 1 width - side stepping with arm addc/ abdct next to pool wall -> across pool -> with rainbow hand floats - UE on wall:  toe/heel raises x 5 (limited due to pt's report of cramping); hip add/abd 2 x 5 ; hip flexion /extension 2 x 5; relaxed squats x 8 - walking forward/ backward without UE support -> with yellow hand floats on top of water  - wide  stance with bilat horiz abdct/ addct with rainbow hand floats x 8  Pt requires the buoyancy and hydrostatic pressure of water for support, and to offload joints by unweighting joint load by at least 50 % in navel deep water and by at least 75-80% in chest to neck deep water.  Viscosity of the water is needed for resistance of strengthening. Water current perturbations provides challenge to standing balance requiring increased core activation.       PATIENT EDUCATION:  Education details: intro to aquatic therapy  Person educated: Patient Education method: Explanation Education comprehension: verbalized understanding  HOME EXERCISE PROGRAM: TBA  ASSESSMENT:  CLINICAL IMPRESSION: Pt demonstrates safety and independence in aquatic setting with therapist instructing from deck. Pt demonstrates confidence in setting, moving throughout 4 ft easily however does better initially with UE support on floatation.  Pt is directed through various movement patterns in standing position. She reported some pain in R hip with hip abdct/ addct, but no pain with side stepping. No dizziness or overt loss of balance.  Goals are ongoing. Plan to create initial aquatic HEP that pt can take on cruise.   Initial impression: Patient is a 58 y.o. f who was seen today for physical therapy evaluation and treatment for chronic pain syndrome/LBP.  She presents today amb with cane.  She is limited with all functional mobility and ADL's.  Uses shower bench to bath, unable to tolerate standing and cooking/cleaning.  Difficulty getting through grocery stores.  She has constant pain in LB with radicular issues bilaterally to feet. NCV le completed with normal results. Pain and nerve deficits likely multifactorial (see PmHx). She tests positive for bilateral le strength deficits, balance dysfunction/high fall risk and limited due to pain with all activities She will benefit from skilled PT intervention to improve management of  chronic pain/conditions and improve safety with all ADL's  and functional mobility.  Will bgin in aquatics where she will benefit from the properties of water to progress towards stated goals then will transition onto land based at able/tolerated.  OBJECTIVE IMPAIRMENTS: Abnormal gait, decreased activity tolerance, decreased balance, decreased endurance, decreased mobility, difficulty walking, decreased strength, improper body mechanics, postural dysfunction, obesity, and pain.   ACTIVITY LIMITATIONS: carrying, lifting, bending, sitting, standing, squatting, sleeping, stairs, transfers, bathing, and locomotion level  PARTICIPATION LIMITATIONS: meal prep, cleaning, laundry, driving, shopping, community activity, and yard work  PERSONAL FACTORS: Time since onset of injury/illness/exacerbation and 3+ comorbidities: see PmHx  are also affecting patient's functional outcome.   REHAB POTENTIAL: Good  CLINICAL DECISION MAKING: Unstable/unpredictable  EVALUATION COMPLEXITY: High   GOALS: Goals reviewed with patient? Yes  SHORT TERM GOALS: Target date: 01/15/24  Pt will tolerate full aquatic sessions consistently without increase in pain and with improving function to demonstrate good toleration and effectiveness of intervention.  Baseline: Goal status: INITIAL  2.  Pt will tolerate walking to and from setting (500 ft each way) and full aquatic session without excessive fatigue or pain Baseline: TBA Goal status: INITIAL  3.  Pt will report a decrease in pain while submerged to 5/10 or less with residual relief for 2 hours after session Baseline: TBA Goal status: INITIAL    LONG TERM GOALS: Target date: 02/24/24  Pt to improve on ODI to 43% (MCID) to demonstrate statistically significant Improvement in function. Baseline: 28/50+56% Goal status: INITIAL  2.  .Pt will improve strength in all areas listed by up towards 10 LB or > to demonstrate improved overall physical function Baseline:  see chart Goal status: INITIAL  3.  Pt will improve on Tug test to <or=  13s to demonstrate improvement in lower extremity function, mobility and decreased fall risk. Baseline:  Goal status: INITIAL  4.  Pt will be indep with final HEP's (land and aquatic as appropriate) for continued management of condition Baseline: none Goal status: INITIAL  5.  Pt will improve on Berg balance test to >/= 42/56 to demonstrate a decrease in fall risk. Baseline: 24/56 Goal status: INITIAL  6.  Pt will report decrease in pain by at least 50% for improved toleration to activity/quality of life and to demonstrate improved management of pain. Baseline: see chart Goal status: INITIAL  PLAN:  PT FREQUENCY: 2x/week  PT DURATION: 8 weeks  PLANNED INTERVENTIONS: 97164- PT Re-evaluation, 97110-Therapeutic exercises, 97530- Therapeutic activity, 97112- Neuromuscular re-education, 97535- Self Care, 47829- Manual therapy, [phone]- Gait training, [phone]- Orthotic Fit/training, [phone]- Aquatic Therapy, [phone]- Electrical stimulation (unattended), [phone]- Ionotophoresis 4mg /ml Dexamethasone, Patient/Family education, Balance training, Stair training, Taping, Dry Needling, Joint mobilization, DME instructions, Cryotherapy, and Moist heat.  PLAN FOR NEXT SESSION: aquatic- 6 weeks onlyt: ROM/strengthening, stretching, pain management, toleration to activity, instruction on benefits of aquatic exercise. Land HEP without using exercise machines for core and LE.  Mayer Camel, PTA 12/27/23 6:01 PM [facility] [address] [place], [place], [address] Phone: [phone]   Fax:  [phone]

## 2024-01-06 ENCOUNTER — Encounter (HOSPITAL_BASED_OUTPATIENT_CLINIC_OR_DEPARTMENT_OTHER): Payer: Self-pay | Admitting: Physical Therapy

## 2024-01-06 ENCOUNTER — Ambulatory Visit (HOSPITAL_BASED_OUTPATIENT_CLINIC_OR_DEPARTMENT_OTHER): Payer: Self-pay | Admitting: Physical Therapy

## 2024-01-06 DIAGNOSIS — M6281 Muscle weakness (generalized): Secondary | ICD-10-CM

## 2024-01-06 DIAGNOSIS — M5459 Other low back pain: Secondary | ICD-10-CM

## 2024-01-06 DIAGNOSIS — R2681 Unsteadiness on feet: Secondary | ICD-10-CM

## 2024-01-06 NOTE — Therapy (Signed)
 OUTPATIENT PHYSICAL THERAPY THORACOLUMBAR TREATMENT   Patient Name: Pamela Esparza MRN: [id number] DOB:[birth year], 58 y.o., female Today's Date: 01/06/2024  END OF SESSION:  PT End of Session - 01/06/24 1350     Visit Number 3    Number of Visits 16    Date for PT Re-Evaluation 02/24/24    Authorization Type BC[employer]    PT Start Time 1343    PT Stop Time 1423    PT Time Calculation (min) 40 min    Behavior During Therapy [facility] for tasks assessed/performed             Past Medical History:  Diagnosis Date   Allergy    Topiramate, Lyrica, aspirin (sensitive to)   Anxiety    Arthritis    Asthma    Cataract    DDD (degenerative disc disease), lumbar    Depression    Depression    Phreesia 10/13/2020   Family history of bladder cancer    Family history of brain cancer 02/15/2023   Fibromyalgia    Hyperlipidemia    Joint pain    Neuromuscular disorder (HCC)    Phreesia 10/13/2020   Neuropathy    Osteoporosis    Sjogren's disease (HCC)    Thyroid disease    Past Surgical History:  Procedure Laterality Date   ANKLE SURGERY Right    APPENDECTOMY     CHOLECYSTECTOMY     ESOPHAGOGASTRODUODENOSCOPY (EGD) WITH PROPOFOL N/A 12/05/2020   Procedure: ESOPHAGOGASTRODUODENOSCOPY (EGD) WITH PROPOFOL;  Surgeon: Pasty Spillers, MD;  Location: [facility];  Service: Endoscopy;  Laterality: N/A;   finger sugery     NECK SURGERY     SPINE SURGERY     On my neck   TENDON TRANSFER Right 11/06/2019   Procedure: PERONEAL RECONSTRUCTION WITH PERONEAL LONGUS RIGHT LEG;  Surgeon: Nadara Mustard, MD;  Location: [facility];  Service: Orthopedics;  Laterality: Right;   TOTAL ABDOMINAL HYSTERECTOMY     TUBAL LIGATION N/A    Phreesia 10/13/2020   Patient Active Problem List   Diagnosis Date Noted   Polyp of colon 01/27/2023   Hyperlipidemia 04/28/2022   Pituitary microadenoma (HCC) 09/14/2021   Primary osteoarthritis 09/03/2021   Sjogren's disease (HCC)  04/16/2021   Spondylosis of lumbar region without myelopathy or radiculopathy 02/04/2021   Essential (primary) hypertension 02/02/2021   Class 3 severe obesity due to excess calories with serious comorbidity and body mass index (BMI) of 45.0 to 49.9 in adult (HCC) 02/02/2021   Chronic pain syndrome 01/12/2021   Peroneal tendinitis, right leg    Multiple atypical nevi 08/14/2019   Haglund's deformity of right heel 06/25/2019   Hypothyroidism 03/21/2019   Chronic joint pain 10/23/2018   DDD (degenerative disc disease), lumbar 10/23/2018   Muscle pain, fibromyalgia 10/23/2018   Lumbosacral spondylosis without myelopathy 08/20/2015   Neuropathy 07/11/2013    PCP: Saralyn Pilar PA  REFERRING PROVIDER: Horton Chin, MD   REFERRING DIAG:  G89.4 (ICD-10-CM) - Chronic pain syndrome  M47.819 (ICD-10-CM) - Facet arthropathy    Rationale for Evaluation and Treatment: Rehabilitation  THERAPY DIAG:  Other low back pain  Muscle weakness (generalized)  Unsteadiness on feet  ONSET DATE: 2013  SUBJECTIVE:  SUBJECTIVE STATEMENT: Pt reports the weather has affected her.  She had relief after aquatic session. She is leaving on cruise on Thursday.      Initial Subjective: 2 back surgeries in neck and LB.  I am having problems with both of my legs if I walk to long my legs feel like they are going to give on me. Muscle will shake and feel weak.  I will have radiating pain down both legs to feet.  Grocery shopping makes it hurt.  I come home and lie down for about an hour and it gets better. I have permanent muscle atrophy in back from a prior injury. Waking x 2 night at least.  I am off balance constantly.  I don't trust my head I will get dizzy, my balance is bad  PERTINENT HISTORY:  Facet arthropathy;  insomnia; chronic Pain Syndrome Pituitary Microadenoma  PAIN:  Are you having pain? Yes: NPRS scale: current 8/10 Pain location: generalized  Pain description: ache, P&N, sharp constant Aggravating factors: movement, walk ~ 30 minutes; standing <10 minutes Relieving factors: trigger point shots, ablations at least 1 x year,  PRECAUTIONS: Fall  RED FLAGS: None   WEIGHT BEARING RESTRICTIONS: No  FALLS:  Has patient fallen in last 6 months? Yes. Number of falls 2 legs give out.  LIVING ENVIRONMENT: Lives with: lives with their family, lives with their spouse, and lives with their son Lives in: House/apartment Stairs: Yes: External: 9 steps; on right going up Has following equipment at home: Single point cane, Walker - 2 wheeled, and scooter; tub bench; grab bars  OCCUPATION: disability x 10 yrs  PLOF: Requires assistive device for independence  PATIENT GOALS: decrease pain, improve leg strength, improve balance  NEXT MD VISIT: every 3 months  OBJECTIVE:  Note: Objective measures were completed at Evaluation unless otherwise noted.  DIAGNOSTIC FINDINGS:  None relevant in chart  PATIENT SURVEYS:  Modified Oswestry 56%   COGNITION: Overall cognitive status: Within functional limits for tasks assessed     SENSATION: WFL  MUSCLE LENGTH: Hamstrings: tightness bilaterally tested in sitting  POSTURE: rounded shoulders and forward head  PALPATION: TTP lumbar paraspinals and into bilateral hips to trochanter snd sciatic notches.  LUMBAR ROM:   AROM eval  Flexion FT to below patella  Extension full  Right lateral flexion 50% limited  Left lateral flexion 50% limited  Right rotation   Left rotation    (Blank rows = not tested)  LOWER EXTREMITY ROM:     wfl  LOWER EXTREMITY MMT:    MMT Right eval Left eval  Hip flexion 29.4 33.3  Hip extension    Hip abduction 22.8 29.4  Hip adduction    Hip internal rotation    Hip external rotation    Knee flexion     Knee extension 22.9 16.9  Ankle dorsiflexion    Ankle plantarflexion    Ankle inversion    Ankle eversion     (Blank rows = not tested)  LUMBAR SPECIAL TESTS:  Slump test: Positive  FUNCTIONAL TESTS:  Timed up and go (TUG): 15.07 complete with cane BERG Balance Test          Date: 12/26/23  Sit to Stand 2  Standing unsupported 3  Sitting with back unsupported but feet supported 4  Stand to sit  2  Transfers  3  Standing unsupported with eyes closed 2  Standing unsupported feet together 2  From standing position, reach forward with outstretched arm 3  From standing position, pick up object from floor 1  From standing position, turn and look behind over each shoulder 1  Turn 360 1  Standing unsupported, alternately place foot on step 0  Standing unsupported, one foot in front 0  Standing on one leg 0  Total:  24/56     GAIT: Distance walked: 400 ft Assistive device utilized: Single point cane Level of assistance:  requires ad safety Comments: slow cadence and decreased step length, guarded position  TREATMENT  [facility] Adult PT Treatment:                                                DATE: 01/06/24  Pt seen for aquatic therapy today.  Treatment took place in water 3.5-4.75 ft in depth at the [facility] pool. Temp of water was 91.  Pt entered/exited the pool via stairs independently with bilat rail.  - unsupported: walking forward/ backward x 4 laps - side stepping with arm addct/ abdct x 1 lap -> with rainbow hand floats x 1 lap (increased LE pain) - relaxed squat at wall - UE on wall: ; hip add/abd 2 x 5 ; hip flexion /extension 2 x 5; relaxed squats x 8 - walking forward/ backward without UE support  - side stepping with UE on rainbow hand floats on top of water - wide stance with bilat horiz abdct/ addct with rainbow hand floats x 8-> no floats x 8 - straddling noodle, UE on corner: cycling; hip abdct/ addct; cycling - at wall: calf stretch x 15s;  "L"  stretch x 15, gentle wag the tail   Pt requires the buoyancy and hydrostatic pressure of water for support, and to offload joints by unweighting joint load by at least 50 % in navel deep water and by at least 75-80% in chest to neck deep water.  Viscosity of the water is needed for resistance of strengthening. Water current perturbations provides challenge to standing balance requiring increased core activation.   PATIENT EDUCATION:  Education details: intro to aquatic therapy  Person educated: Patient Education method: Explanation Education comprehension: verbalized understanding  HOME EXERCISE PROGRAM: AQUATIC Access Code: [email or web address] URL: [email or web address] Date: 01/06/2024 Prepared by: [facility] [address] [address] This aquatic home exercise program from [employer] utilizes pictures from land based exercises, but has been adapted prior to lamination and issuance.     ASSESSMENT:  CLINICAL IMPRESSION: Pt able to move in water deeper than  4 ft easily however does better initially with UE support on floatation.  She complains of some LE pain during session (waxes and wanes depending on exercise). Pt was issued initial aquatic HEP to utilize on cruise next week.  Pt reported minimal change in pain during session.  Goals are ongoing.   Initial impression: Patient is a 58 y.o. f who was seen today for physical therapy evaluation and treatment for chronic pain syndrome/LBP.  She presents today amb with cane.  She is limited with all functional mobility and ADL's.  Uses shower bench to bath, unable to tolerate standing and cooking/cleaning.  Difficulty getting through grocery stores.  She has constant pain in LB with radicular issues bilaterally to feet. NCV le completed with normal results. Pain and nerve deficits likely multifactorial (see PmHx). She tests positive for bilateral le strength deficits, balance dysfunction/high fall risk and limited due to  pain with all  activities She will benefit from skilled PT intervention to improve management of chronic pain/conditions and improve safety with all ADL's and functional mobility.  Will bgin in aquatics where she will benefit from the properties of water to progress towards stated goals then will transition onto land based at able/tolerated.  OBJECTIVE IMPAIRMENTS: Abnormal gait, decreased activity tolerance, decreased balance, decreased endurance, decreased mobility, difficulty walking, decreased strength, improper body mechanics, postural dysfunction, obesity, and pain.   ACTIVITY LIMITATIONS: carrying, lifting, bending, sitting, standing, squatting, sleeping, stairs, transfers, bathing, and locomotion level  PARTICIPATION LIMITATIONS: meal prep, cleaning, laundry, driving, shopping, community activity, and yard work  PERSONAL FACTORS: Time since onset of injury/illness/exacerbation and 3+ comorbidities: see PmHx  are also affecting patient's functional outcome.   REHAB POTENTIAL: Good  CLINICAL DECISION MAKING: Unstable/unpredictable  EVALUATION COMPLEXITY: High   GOALS: Goals reviewed with patient? Yes  SHORT TERM GOALS: Target date: 01/15/24  Pt will tolerate full aquatic sessions consistently without increase in pain and with improving function to demonstrate good toleration and effectiveness of intervention.  Baseline: Goal status: INITIAL  2.  Pt will tolerate walking to and from setting (500 ft each way) and full aquatic session without excessive fatigue or pain Baseline: TBA Goal status: INITIAL  3.  Pt will report a decrease in pain while submerged to 5/10 or less with residual relief for 2 hours after session Baseline: TBA Goal status: INITIAL    LONG TERM GOALS: Target date: 02/24/24  Pt to improve on ODI to 43% (MCID) to demonstrate statistically significant Improvement in function. Baseline: 28/50+56% Goal status: INITIAL  2.  .Pt will improve strength in all areas listed by  up towards 10 LB or > to demonstrate improved overall physical function Baseline: see chart Goal status: INITIAL  3.  Pt will improve on Tug test to <or=  13s to demonstrate improvement in lower extremity function, mobility and decreased fall risk. Baseline:  Goal status: INITIAL  4.  Pt will be indep with final HEP's (land and aquatic as appropriate) for continued management of condition Baseline: none Goal status: INITIAL  5.  Pt will improve on Berg balance test to >/= 42/56 to demonstrate a decrease in fall risk. Baseline: 24/56 Goal status: INITIAL  6.  Pt will report decrease in pain by at least 50% for improved toleration to activity/quality of life and to demonstrate improved management of pain. Baseline: see chart Goal status: INITIAL  PLAN:  PT FREQUENCY: 2x/week  PT DURATION: 8 weeks  PLANNED INTERVENTIONS: 97164- PT Re-evaluation, 97110-Therapeutic exercises, 97530- Therapeutic activity, 97112- Neuromuscular re-education, 97535- Self Care, 21308- Manual therapy, [phone]- Gait training, [phone]- Orthotic Fit/training, [phone]- Aquatic Therapy, [phone]- Electrical stimulation (unattended), [phone]- Ionotophoresis 4mg /ml Dexamethasone, Patient/Family education, Balance training, Stair training, Taping, Dry Needling, Joint mobilization, DME instructions, Cryotherapy, and Moist heat.  PLAN FOR NEXT SESSION: aquatic- 6 weeks onlyt: ROM/strengthening, stretching, pain management, toleration to activity, instruction on benefits of aquatic exercise. Land HEP without using exercise machines for core and LE.  Mayer Camel, PTA 01/06/24 2:28 PM [facility] [address] [place], [place], [address] Phone: [phone]   Fax:  [phone]

## 2024-01-25 ENCOUNTER — Other Ambulatory Visit: Payer: Self-pay

## 2024-01-25 DIAGNOSIS — I70213 Atherosclerosis of native arteries of extremities with intermittent claudication, bilateral legs: Secondary | ICD-10-CM

## 2024-01-27 ENCOUNTER — Encounter: Payer: Self-pay | Admitting: Physical Medicine and Rehabilitation

## 2024-01-27 ENCOUNTER — Encounter
Payer: Federal, State, Local not specified - PPO | Attending: Physical Medicine and Rehabilitation | Admitting: Physical Medicine and Rehabilitation

## 2024-01-27 VITALS — BP 137/83 | HR 67 | Ht 65.0 in | Wt 268.0 lb

## 2024-01-27 DIAGNOSIS — G6289 Other specified polyneuropathies: Secondary | ICD-10-CM | POA: Insufficient documentation

## 2024-01-27 DIAGNOSIS — R110 Nausea: Secondary | ICD-10-CM | POA: Diagnosis present

## 2024-01-27 DIAGNOSIS — M47816 Spondylosis without myelopathy or radiculopathy, lumbar region: Secondary | ICD-10-CM | POA: Insufficient documentation

## 2024-01-27 DIAGNOSIS — M5010 Cervical disc disorder with radiculopathy, unspecified cervical region: Secondary | ICD-10-CM | POA: Diagnosis present

## 2024-01-27 DIAGNOSIS — M542 Cervicalgia: Secondary | ICD-10-CM | POA: Insufficient documentation

## 2024-01-27 NOTE — Progress Notes (Signed)
 Subjective:    Patient ID: Pamela Esparza, female    DOB: 08-10-1966, 58 y.o.   MRN: [id number]  HPI Pamela Esparza is a 58 year old woman who presents to establish care for leg pain  1) Leg pain: -radiates from the back -has leg swelling  -she has been diagnosed with sciatica and herniated disc -she gets steroid injections in [facility] and these help with the back pain -this has happened since 2012 -doing PT  2) Insomnia: -she takes flexeril and this helps her sleep  3) Nausea: -started with LDN and stopped after she stopped the medicine  4) Cervical spine pain: -radiates into her arms -she has had prior cervical spine surgery -when she had surgery she was dropping objects -she has tried postural corrections but has muscle spasm  Pain Inventory Average Pain 8 Pain Right Now 8 My pain is constant, sharp, burning, dull, stabbing, tingling, and aching  In the last 24 hours, has pain interfered with the following? General activity 8 Relation with others 7 Enjoyment of life 10 What TIME of day is your pain at its worst? morning , daytime, evening, night, and varies Sleep (in general) Fair  Pain is worse with: walking, bending, sitting, inactivity, standing, and some activites Pain improves with: rest, therapy/exercise, pacing activities, medication, TENS, injections, and heat Relief from Meds: 9  walk with assistance use a cane use a walker ability to climb steps?  yes do you drive?  yes Do you have any goals in this area?  yes  disabled: date disabled 2013? I need assistance with the following:  dressing, bathing, meal prep, household duties, and shopping Do you have any goals in this area?  yes  bladder control problems bowel control problems weakness numbness tremor tingling trouble walking spasms dizziness confusion depression anxiety  Any changes since last visit?  no  Any changes since last visit?  no    Family History  Problem  Relation Age of Onset   Breast cancer Mother 56       second cancer at 4   Lung cancer Mother 80   Arthritis Mother    Cancer Mother    COPD Mother    Heart attack Father    Hypertension Father    Diabetes Father    Bladder Cancer Father 54   Arthritis Father    Asthma Father    Cancer Father    COPD Father    Heart disease Father    Stroke Father    Vision loss Father    Lung cancer Maternal Aunt    Cancer Maternal Aunt    Brain cancer Paternal Uncle    Thyroid  disease Paternal Uncle    Birth defects Maternal Grandmother        ovarian   Depression Maternal Grandmother    Uterine cancer Maternal Grandmother        d. 72s   Cancer Maternal Grandmother    Brain cancer Paternal Grandmother    Cancer Paternal Grandmother    Obesity Paternal Grandmother    ADD / ADHD Son    Cancer Paternal Uncle    Early death Paternal Uncle    Obesity Paternal Uncle    Colon cancer Neg Hx    Colon polyps Neg Hx    Esophageal cancer Neg Hx    Stomach cancer Neg Hx    Rectal cancer Neg Hx    Social History   Socioeconomic History   Marital status: Married  Spouse name: Not on file   Number of children: Not on file   Years of education: Not on file   Highest education level: Associate degree: occupational, Scientist, product/process development, or vocational program  Occupational History   Not on file  Tobacco Use   Smoking status: Never    Passive exposure: Never   Smokeless tobacco: Never   Tobacco comments:    Never smoked  Vaping Use   Vaping status: Never Used  Substance and Sexual Activity   Alcohol use: Not Currently    Comment: 4 drinks a year-rare   Drug use: Never   Sexual activity: Not Currently    Comment: Total hysterectomy  Other Topics Concern   Not on file  Social History Narrative   Not on file   Social Drivers of Health   Financial Resource Strain: Low Risk  (08/02/2023)   Overall Financial Resource Strain (CARDIA)    Difficulty of Paying Living Expenses: Not very hard   Food Insecurity: No Food Insecurity (12/16/2023)   Hunger Vital Sign    Worried About Running Out of Food in the Last Year: Never true    Ran Out of Food in the Last Year: Never true  Transportation Needs: No Transportation Needs (08/02/2023)   PRAPARE - Administrator, Civil Service (Medical): No    Lack of Transportation (Non-Medical): No  Physical Activity: Unknown (08/02/2023)   Exercise Vital Sign    Days of Exercise per Week: 0 days    Minutes of Exercise per Session: Not on file  Recent Concern: Physical Activity - Inactive (08/02/2023)   Exercise Vital Sign    Days of Exercise per Week: 0 days    Minutes of Exercise per Session: 0 min  Stress: No Stress Concern Present (08/02/2023)   [employer] of Occupational Health - Occupational Stress Questionnaire    Feeling of Stress : Only a little  Social Connections: Moderately Integrated (08/02/2023)   Social Connection and Isolation Panel [NHANES]    Frequency of Communication with Friends and Family: More than three times a week    Frequency of Social Gatherings with Friends and Family: Never    Attends Religious Services: 1 to 4 times per year    Active Member of [employer] or Organizations: No    Attends Engineer, structural: Not on file    Marital Status: Married   Past Surgical History:  Procedure Laterality Date   ANKLE SURGERY Right    APPENDECTOMY     CHOLECYSTECTOMY     ESOPHAGOGASTRODUODENOSCOPY (EGD) WITH PROPOFOL  N/A 12/05/2020   Procedure: ESOPHAGOGASTRODUODENOSCOPY (EGD) WITH PROPOFOL ;  Surgeon: Irby Mannan, MD;  Location: [facility];  Service: Endoscopy;  Laterality: N/A;   finger sugery     NECK SURGERY     SPINE SURGERY     On my neck   TENDON TRANSFER Right 11/06/2019   Procedure: PERONEAL RECONSTRUCTION WITH PERONEAL LONGUS RIGHT LEG;  Surgeon: Timothy Ford, MD;  Location: [facility];  Service: Orthopedics;  Laterality: Right;   TOTAL ABDOMINAL HYSTERECTOMY      TUBAL LIGATION N/A    Phreesia 10/13/2020   Past Medical History:  Diagnosis Date   Allergy    Topiramate, Lyrica , aspirin (sensitive to)   Anxiety    Arthritis    Asthma    Cataract    DDD (degenerative disc disease), lumbar    Depression    Depression    Phreesia 10/13/2020   Family history of bladder cancer  Family history of brain cancer 02/15/2023   Fibromyalgia    Hyperlipidemia    Joint pain    Neuromuscular disorder (HCC)    Phreesia 10/13/2020   Neuropathy    Osteoporosis    Sjogren's disease (HCC)    Thyroid  disease    Ht 5\' 5"  (1.651 m)   Wt 268 lb (121.6 kg)   BMI 44.60 kg/m   Opioid Risk Score:   Fall Risk Score:  `1  Depression screen [facility] 2/9     01/27/2024   10:06 AM 12/16/2023    8:53 AM 11/24/2023    9:54 AM 11/09/2023   10:35 AM 08/24/2023    2:53 PM 08/03/2023    2:27 PM 05/24/2023    1:41 PM  Depression screen PHQ 2/9  Decreased Interest 0 0 0 1 0 2 1  Down, Depressed, Hopeless 0 0 0 0 0 0 1  PHQ - 2 Score 0 0 0 1 0 2 2  Altered sleeping   0 1 0 2 2  Tired, decreased energy   0 2 0 3 3  Change in appetite   0 0 0 0 0  Feeling bad or failure about yourself    0 0 0 0 0  Trouble concentrating   0 2 3 2 3   Moving slowly or fidgety/restless   0 0 0 0 0  Suicidal thoughts   0 0 0 0 0  PHQ-9 Score   0 6 3 9 10   Difficult doing work/chores    Somewhat difficult Not difficult at all Very difficult Very difficult    Review of Systems  Gastrointestinal:  Positive for constipation.  Genitourinary:  Positive for frequency.  Musculoskeletal:  Positive for gait problem.       Pain all over the body  Neurological:  Positive for weakness and numbness.  Psychiatric/Behavioral:  Positive for confusion.        Depression, anxiety  All other systems reviewed and are negative.      Objective:   Physical Exam Gen: no distress, normal appearing HEENT: oral mucosa pink and moist, NCAT Cardio: Reg rate Chest: normal effort, normal rate of  breathing Abd: soft, non-distended Ext: no edema Psych: pleasant, normal affect Skin: intact Neuro: Alert and oriented x3, 4/5 strength upper extremities     Assessment & Plan:   1) Chronic Pain Syndrome secondary to leg pain: -discussed that she had EMG/[place] testing and this was told to her to be normal -discussed that she used to be strong -discussed that she avoids processed foods -Discussed current symptoms of pain and history of pain.  -Discussed benefits of exercise in reducing pain. -Discussed following foods that may reduce pain: 1) Ginger (especially studied for arthritis)- reduce leukotriene production to decrease inflammation 2) Blueberries- high in phytonutrients that decrease inflammation 3) Salmon- marine omega-3s reduce joint swelling and pain 4) Pumpkin seeds- reduce inflammation 5) dark chocolate- reduces inflammation 6) turmeric- reduces inflammation 7) tart cherries - reduce pain and stiffness 8) extra virgin olive oil - its compound olecanthal helps to block prostaglandins  9) chili peppers- can be eaten or applied topically via capsaicin 10) mint- helpful for headache, muscle aches, joint pain, and itching 11) garlic- reduces inflammation   2) Insomnia: -Try to go outside near sunrise -Get exercise during the day.  -Turn off all devices an hour before bedtime.  -Teas that can benefit: chamomile, valerian root, Brahmi (Bacopa) -Can consider over the counter melatonin, magnesium, and/or L-theanine. Melatonin is an anti-oxidant  with multiple health benefits. Magnesium is involved in greater than 300 enzymatic reactions in the body and most of us  are deficient as our soil is often depleted. There are 7 different types of magnesium- Bioptemizer's is a supplement with all 7 types, and each has unique benefits. Magnesium can also help with constipation and anxiety.  -Pistachios naturally increase the production of melatonin -Cozy Earth bamboo bed sheets are free from  toxic chemicals.  -Tart cherry juice or a tart cherry supplement can improve sleep and soreness post-workout    Link to further information on diet for chronic pain: http://www.bray.com/    3) Facet arthropathy: -discussed that she has had medial branch blocks  4) Constipation:  -Provided list of following foods that help with constipation and highlighted a few: 1) prunes- contain high amounts of fiber.  2) apples- has a form of dietary fiber called pectin that accelerates stool movement and increases beneficial gut bacteria 3) pears- in addition to fiber, also high in fructose and sorbitol which have laxative effect 4) figs- contain an enzyme ficin which helps to speed colonic transit 5) kiwis- contain an enzyme actinidin that improves gut motility and reduces constipation 6) oranges- rich in pectin (like apples) 7) grapefruits- contain a flavanol naringenin which has a laxative effect 8) vegetables- rich in fiber and also great sources of folate, vitamin C, and K 9) artichoke- high in inulin, prebiotic great for the microbiome 10) chicory- increases stool frequency and softness (can be added to coffee) 11) rhubarb- laxative effect 12) sweet potato- high fiber 13) beans, peas, and lentils- contain both soluble and insoluble fiber 14) chia seeds- improves intestinal health and gut flora 15) flaxseeds- laxative effect 16) whole grain rye bread- high in fiber 17) oat bran- high in soluble and insoluble fiber 18) kefir- softens stools -recommended to try at least one of these foods every day.  -drink 6-8 glasses of water per day -walk regularly, especially after meals.   5) Shortness of breath: -discussed that she had a thorough eval and her heart is working well  6) Cervical spine pain:  -discussed getting XR -Discussed Qutenza as an option for neuropathic pain control. Discussed that this is a capsaicin  patch, stronger than capsaicin cream. Discussed that it is currently approved for diabetic peripheral neuropathy and post-herpetic neuralgia, but that it has also shown benefit in treating other forms of neuropathy. Provided patient with link to site to learn more about the patch: https://www.clark.biz/. Discussed that the patch would be placed in office and benefits usually last 3 months. Discussed that unintended exposure to capsaicin can cause severe irritation of eyes, mucous membranes, respiratory tract, and skin, but that Qutenza is a local treatment and does not have the systemic side effects of other nerve medications. Discussed that there may be pain, itching, erythema, and decreased sensory function associated with the application of Qutenza. Side effects usually subside within 1 week. A cold pack of analgesic medications can help with these side effects. Blood pressure can also be increased due to pain associated with administration of the patch.   7) Neuropathy: -continue voltaren  -Discussed Qutenza as an option for neuropathic pain control. Discussed that this is a capsaicin patch, stronger than capsaicin cream. Discussed that it is currently approved for diabetic peripheral neuropathy and post-herpetic neuralgia, but that it has also shown benefit in treating other forms of neuropathy. Provided patient with link to site to learn more about the patch: https://www.clark.biz/. Discussed that the patch would be placed  in office and benefits usually last 3 months. Discussed that unintended exposure to capsaicin can cause severe irritation of eyes, mucous membranes, respiratory tract, and skin, but that Qutenza is a local treatment and does not have the systemic side effects of other nerve medications. Discussed that there may be pain, itching, erythema, and decreased sensory function associated with the application of Qutenza. Side effects usually subside within 1 week. A cold pack of analgesic  medications can help with these side effects. Blood pressure can also be increased due to pain associated with administration of the patch.   -frankincense and myrrh  8) Lumbar radiculopathy:  -discussed avoiding processed foods  -Discussed Qutenza as an option for neuropathic pain control. Discussed that this is a capsaicin patch, stronger than capsaicin cream. Discussed that it is currently approved for diabetic peripheral neuropathy and post-herpetic neuralgia, but that it has also shown benefit in treating other forms of neuropathy. Provided patient with link to site to learn more about the patch: https://www.clark.biz/. Discussed that the patch would be placed in office and benefits usually last 3 months. Discussed that unintended exposure to capsaicin can cause severe irritation of eyes, mucous membranes, respiratory tract, and skin, but that Qutenza is a local treatment and does not have the systemic side effects of other nerve medications. Discussed that there may be pain, itching, erythema, and decreased sensory function associated with the application of Qutenza. Side effects usually subside within 1 week. A cold pack of analgesic medications can help with these side effects. Blood pressure can also be increased due to pain associated with administration of the patch.

## 2024-01-27 NOTE — Patient Instructions (Addendum)
 Frankincense and myrrh  Methylated B vitamin  CBD oil  Red light therapy

## 2024-01-30 ENCOUNTER — Encounter: Payer: Self-pay | Admitting: Surgery

## 2024-01-30 ENCOUNTER — Ambulatory Visit
Admission: RE | Admit: 2024-01-30 | Discharge: 2024-01-30 | Disposition: A | Discharge status: Home / Self Care | Source: Ambulatory Visit | Attending: Physical Medicine and Rehabilitation

## 2024-01-30 ENCOUNTER — Ambulatory Visit (HOSPITAL_COMMUNITY)
Admission: RE | Admit: 2024-01-30 | Discharge: 2024-01-30 | Disposition: A | Discharge status: Home / Self Care | Source: Ambulatory Visit | Attending: Vascular Surgery | Admitting: Vascular Surgery

## 2024-01-30 ENCOUNTER — Ambulatory Visit: Attending: Surgery | Admitting: Surgery

## 2024-01-30 VITALS — BP 141/84 | HR 60 | Temp 98.0°F | Ht 65.0 in | Wt 270.0 lb

## 2024-01-30 DIAGNOSIS — M25562 Pain in left knee: Secondary | ICD-10-CM | POA: Diagnosis not present

## 2024-01-30 DIAGNOSIS — I70213 Atherosclerosis of native arteries of extremities with intermittent claudication, bilateral legs: Secondary | ICD-10-CM | POA: Diagnosis present

## 2024-01-30 DIAGNOSIS — M25561 Pain in right knee: Secondary | ICD-10-CM | POA: Diagnosis not present

## 2024-01-30 DIAGNOSIS — M542 Cervicalgia: Secondary | ICD-10-CM

## 2024-01-30 LAB — VAS US ABI WITH/WO TBI
Left ABI: 1.07
Right ABI: 1.02

## 2024-01-30 NOTE — Progress Notes (Signed)
 [facility] [place]  Patient name: Pamela Esparza MRN: [id number] DOB: 06-08-1966 Sex: female   REQUESTING PROVIDER:    Laverle Postin   REASON FOR CONSULT:    Leg pain  HISTORY OF PRESENT ILLNESS:   Pamela Esparza is a 58 y.o. female, who is referred for evaluation of leg pain.  She states that this has been going on for several years.  She says her legs will give out.  She has fallen several times.  She does have a history of sciatica and back issues.  She also complains of leg swelling.  She suffers from fibromyalgia and Sjogren's disease.  PAST MEDICAL HISTORY    Past Medical History:  Diagnosis Date   Allergy    Topiramate, Lyrica , aspirin (sensitive to)   Anxiety    Arthritis    Asthma    Cataract    DDD (degenerative disc disease), lumbar    Depression    Depression    Phreesia 10/13/2020   Family history of bladder cancer    Family history of brain cancer 02/15/2023   Fibromyalgia    Hyperlipidemia    Joint pain    Neuromuscular disorder (HCC)    Phreesia 10/13/2020   Neuropathy    Osteoporosis    Sjogren's disease (HCC)    Thyroid  disease      FAMILY HISTORY   Family History  Problem Relation Age of Onset   Breast cancer Mother 55       second cancer at 93   Lung cancer Mother 77   Arthritis Mother    Cancer Mother    COPD Mother    Heart attack Father    Hypertension Father    Diabetes Father    Bladder Cancer Father 62   Arthritis Father    Asthma Father    Cancer Father    COPD Father    Heart disease Father    Stroke Father    Vision loss Father    Lung cancer Maternal Aunt    Cancer Maternal Aunt    Brain cancer Paternal Uncle    Thyroid  disease Paternal Uncle    Birth defects Maternal Grandmother        ovarian   Depression Maternal Grandmother    Uterine cancer Maternal Grandmother        d. 66s   Cancer Maternal Grandmother    Brain cancer Paternal Grandmother     Cancer Paternal Grandmother    Obesity Paternal Grandmother    ADD / ADHD Son    Cancer Paternal Uncle    Early death Paternal Uncle    Obesity Paternal Uncle    Colon cancer Neg Hx    Colon polyps Neg Hx    Esophageal cancer Neg Hx    Stomach cancer Neg Hx    Rectal cancer Neg Hx     SOCIAL HISTORY:   Social History   Socioeconomic History   Marital status: Married    Spouse name: Not on file   Number of children: Not on file   Years of education: Not on file   Highest education level: Associate degree: occupational, Scientist, product/process development, or vocational program  Occupational History   Not on file  Tobacco Use   Smoking status: Never    Passive exposure: Never   Smokeless tobacco: Never   Tobacco comments:    Never smoked  Vaping Use   Vaping status: Never Used  Substance and Sexual Activity   Alcohol use:  Not Currently    Comment: 4 drinks a year-rare   Drug use: Never   Sexual activity: Not Currently    Comment: Total hysterectomy  Other Topics Concern   Not on file  Social History Narrative   Not on file   Social Drivers of Health   Financial Resource Strain: Low Risk  (08/02/2023)   Overall Financial Resource Strain (CARDIA)    Difficulty of Paying Living Expenses: Not very hard  Food Insecurity: No Food Insecurity (12/16/2023)   Hunger Vital Sign    Worried About Running Out of Food in the Last Year: Never true    Ran Out of Food in the Last Year: Never true  Transportation Needs: No Transportation Needs (08/02/2023)   PRAPARE - Administrator, Civil Service (Medical): No    Lack of Transportation (Non-Medical): No  Physical Activity: Unknown (08/02/2023)   Exercise Vital Sign    Days of Exercise per Week: 0 days    Minutes of Exercise per Session: Not on file  Recent Concern: Physical Activity - Inactive (08/02/2023)   Exercise Vital Sign    Days of Exercise per Week: 0 days    Minutes of Exercise per Session: 0 min  Stress: No Stress Concern  Present (08/02/2023)   [employer] of Occupational Health - Occupational Stress Questionnaire    Feeling of Stress : Only a little  Social Connections: Moderately Integrated (08/02/2023)   Social Connection and Isolation Panel [NHANES]    Frequency of Communication with Friends and Family: More than three times a week    Frequency of Social Gatherings with Friends and Family: Never    Attends Religious Services: 1 to 4 times per year    Active Member of [employer] or Organizations: No    Attends Engineer, structural: Not on file    Marital Status: Married  Catering manager Violence: Not At Risk (12/27/2022)   Humiliation, Afraid, Rape, and Kick questionnaire    Fear of Current or Ex-Partner: No    Emotionally Abused: No    Physically Abused: No    Sexually Abused: No    ALLERGIES:    Allergies  Allergen Reactions   Aspirin Other (See Comments)    stomach upset stomach upset    Hydrocodone-Acetaminophen  Other (See Comments)    made her sick made her sick    Azithromycin      Patient states it does not work for her   Chlorhexidine      Pt reports possible chlorarpep allergy   Codeine     Other reaction(s): Upset stomach (finding)   Lyrica  [Pregabalin ]    Solu-Medrol  [Methylprednisolone ] Itching   Topiramate     CURRENT MEDICATIONS:    Current Outpatient Medications  Medication Sig Dispense Refill   albuterol  (VENTOLIN  HFA) 108 (90 Base) MCG/ACT inhaler INHALE 1 TO 2 PUFFS INTO THE LUNGS EVERY 6 HOURS AS NEEDED (Patient taking differently: Inhale 1 puff into the lungs every 6 (six) hours as needed for wheezing or shortness of breath.) 54 g 1   ALPRAZolam  (XANAX ) 0.5 MG tablet Take 0.5-1 tablets (0.25-0.5 mg total) by mouth daily as needed. (Patient taking differently: Take 0.25-0.5 mg by mouth daily as needed for anxiety.) 10 tablet 0   Cholecalciferol (VITAMIN D3) 75 MCG (3000 UT) TABS Take 5,000 Int'l Units by mouth daily.     co-enzyme Q-10 30 MG capsule Take 30  mg by mouth daily.     cyclobenzaprine (FLEXERIL) 5 MG tablet Take 5 mg by  mouth 3 (three) times daily as needed for muscle spasms.     DULoxetine (CYMBALTA) 60 MG capsule Take 120 mg by mouth daily.     fluticasone  (FLONASE ) 50 MCG/ACT nasal spray SHAKE LIQUID AND USE 1 SPRAY IN EACH NOSTRIL DAILY (Patient taking differently: Place 1 spray into both nostrils daily as needed for allergies.) 16 g 3   gabapentin  (NEURONTIN ) 300 MG capsule Take 300 mg by mouth at bedtime.     gabapentin  (NEURONTIN ) 600 MG tablet TAKE 1 TABLET(600 MG) BY MOUTH THREE TIMES DAILY 270 tablet 0   hydroxychloroquine (PLAQUENIL) 200 MG tablet Take 200 mg by mouth 2 (two) times daily.     levothyroxine  (SYNTHROID ) 50 MCG tablet Take 1 tablet (50 mcg total) by mouth daily before breakfast. 90 tablet 3   meloxicam (MOBIC) 15 MG tablet Take 15 mg by mouth at bedtime.     Multiple Vitamins-Minerals (CENTRUM SILVER PO) Take by mouth.     nitroGLYCERIN  (NITROSTAT ) 0.4 MG SL tablet Place 1 tablet (0.4 mg total) under the tongue every 5 (five) minutes as needed for chest pain. 10 tablet 1   nystatin  (MYCOSTATIN /NYSTOP ) powder Apply topically 2 (two) times daily. (Patient taking differently: Apply 1 Application topically daily as needed (for dry skin).) 15 g 2   oxyCODONE -acetaminophen  (PERCOCET) 10-325 MG tablet Take 1 tablet by mouth every 4 (four) hours.     pilocarpine (SALAGEN) 5 MG tablet Take 5 mg by mouth 3 (three) times daily.     Probiotic Product (PROBIOTIC PO) Take 1 tablet by mouth at bedtime.     Semaglutide -Weight Management (WEGOVY ) 0.5 MG/0.5ML SOAJ Inject 0.5 mg into the skin once a week. 2 mL 3   No current facility-administered medications for this visit.    REVIEW OF SYSTEMS:   [X]  denotes positive finding, [ ]  denotes negative finding Cardiac  Comments:  Chest pain or chest pressure:    Shortness of breath upon exertion:    Short of breath when lying flat:    Irregular heart rhythm:        Vascular     Pain in calf, thigh, or hip brought on by ambulation: x   Pain in feet at night that wakes you up from your sleep:  x   Blood clot in your veins:    Leg swelling:  x       Pulmonary    Oxygen at home:    Productive cough:     Wheezing:         Neurologic    Sudden weakness in arms or legs:     Sudden numbness in arms or legs:     Sudden onset of difficulty speaking or slurred speech:    Temporary loss of vision in one eye:     Problems with dizziness:         Gastrointestinal    Blood in stool:      Vomited blood:         Genitourinary    Burning when urinating:     Blood in urine:        Psychiatric    Major depression:         Hematologic    Bleeding problems:    Problems with blood clotting too easily:        Skin    Rashes or ulcers:        Constitutional    Fever or chills:     PHYSICAL EXAM:   Vitals:  01/30/24 1133  BP: (!) 141/84  Pulse: 60  Temp: 98 F (36.7 C)  SpO2: 98%  Weight: 270 lb (122.5 kg)  Height: 5\' 5"  (1.651 m)    GENERAL: The patient is a well-nourished female, in no acute distress. The vital signs are documented above. CARDIAC: There is a regular rate and rhythm.  VASCULAR: Palpable pedal pulses.  Trace edema bilaterally PULMONARY: Nonlabored respirations ABDOMEN: Soft and non-tender with normal pitched bowel sounds.  MUSCULOSKELETAL: There are no major deformities or cyanosis. NEUROLOGIC: No focal weakness or paresthesias are detected. SKIN: There are no ulcers or rashes noted. PSYCHIATRIC: The patient has a normal affect.  STUDIES:   I have reviewed the following: +-------+-----------+-----------+------------+------------+  ABI/TBIToday's ABIToday's TBIPrevious ABIPrevious TBI  +-------+-----------+-----------+------------+------------+  Right 1.02       0.88       1.02        0.8           +-------+-----------+-----------+------------+------------+  Left  1.07       0.86       1.01        0.72           +-------+-----------+-----------+------------+------------+  Right toe pressure: 147 Left toe pressure: 143 All waveforms are triphasic  ASSESSMENT and PLAN   Leg pain.  I discussed with the patient that she has normal ABIs and toe pressures with triphasic waveforms as well as palpable pedal pulses.  I do not feel that she has any evidence of arterial insufficiency contributing to her symptoms.  She does have trace edema and I encouraged her to wear compression stockings which she says she has tried in the past and they help with pain but do not help with her legs giving out.  My suspicion is that this is a neuropathic issue or part of a autoimmune process.   Marti Slates, MD, FACS [facility] [phone] Pager [phone]

## 2024-01-31 ENCOUNTER — Encounter (HOSPITAL_BASED_OUTPATIENT_CLINIC_OR_DEPARTMENT_OTHER): Payer: Self-pay | Admitting: Physical Therapy

## 2024-01-31 ENCOUNTER — Ambulatory Visit (HOSPITAL_BASED_OUTPATIENT_CLINIC_OR_DEPARTMENT_OTHER): Attending: Physical Medicine and Rehabilitation | Admitting: Physical Therapy

## 2024-01-31 DIAGNOSIS — M5459 Other low back pain: Secondary | ICD-10-CM | POA: Insufficient documentation

## 2024-01-31 DIAGNOSIS — M6281 Muscle weakness (generalized): Secondary | ICD-10-CM | POA: Diagnosis present

## 2024-01-31 DIAGNOSIS — R2681 Unsteadiness on feet: Secondary | ICD-10-CM | POA: Insufficient documentation

## 2024-01-31 NOTE — Therapy (Signed)
 OUTPATIENT PHYSICAL THERAPY THORACOLUMBAR TREATMENT   Patient Name: Pamela Esparza MRN: [id number] DOB:1966/05/11, 58 y.o., female Today's Date: 01/31/2024  END OF SESSION:  PT End of Session - 01/31/24 1020     Visit Number 4    Number of Visits 16    Date for PT Re-Evaluation 02/24/24    Authorization Type BCBS    PT Start Time 1017    PT Stop Time 1055    PT Time Calculation (min) 38 min    Activity Tolerance Patient tolerated treatment well    Behavior During Therapy WFL for tasks assessed/performed             Past Medical History:  Diagnosis Date   Allergy    Topiramate, Lyrica , aspirin (sensitive to)   Anxiety    Arthritis    Asthma    Cataract    DDD (degenerative disc disease), lumbar    Depression    Depression    Phreesia 10/13/2020   Family history of bladder cancer    Family history of brain cancer 02/15/2023   Fibromyalgia    Hyperlipidemia    Joint pain    Neuromuscular disorder (HCC)    Phreesia 10/13/2020   Neuropathy    Osteoporosis    Sjogren's disease (HCC)    Thyroid  disease    Past Surgical History:  Procedure Laterality Date   ANKLE SURGERY Right    APPENDECTOMY     CHOLECYSTECTOMY     ESOPHAGOGASTRODUODENOSCOPY (EGD) WITH PROPOFOL  N/A 12/05/2020   Procedure: ESOPHAGOGASTRODUODENOSCOPY (EGD) WITH PROPOFOL ;  Surgeon: Irby Mannan, MD;  Location: [facility];  Service: Endoscopy;  Laterality: N/A;   finger sugery     NECK SURGERY     SPINE SURGERY     On my neck   TENDON TRANSFER Right 11/06/2019   Procedure: PERONEAL RECONSTRUCTION WITH PERONEAL LONGUS RIGHT LEG;  Surgeon: Timothy Ford, MD;  Location: [facility];  Service: Orthopedics;  Laterality: Right;   TOTAL ABDOMINAL HYSTERECTOMY     TUBAL LIGATION N/A    Phreesia 10/13/2020   Patient Active Problem List   Diagnosis Date Noted   Polyp of colon 01/27/2023   Hyperlipidemia 04/28/2022   Pituitary microadenoma (HCC) 09/14/2021   Primary  osteoarthritis 09/03/2021   Sjogren's disease (HCC) 04/16/2021   Spondylosis of lumbar region without myelopathy or radiculopathy 02/04/2021   Essential (primary) hypertension 02/02/2021   Class 3 severe obesity due to excess calories with serious comorbidity and body mass index (BMI) of 45.0 to 49.9 in adult 02/02/2021   Chronic pain syndrome 01/12/2021   Peroneal tendinitis, right leg    Multiple atypical nevi 08/14/2019   Haglund's deformity of right heel 06/25/2019   Hypothyroidism 03/21/2019   Chronic joint pain 10/23/2018   DDD (degenerative disc disease), lumbar 10/23/2018   Muscle pain, fibromyalgia 10/23/2018   Lumbosacral spondylosis without myelopathy 08/20/2015   Neuropathy 07/11/2013    PCP: Maryclare Smoke PA  REFERRING PROVIDER: Liam Redhead, MD   REFERRING DIAG:  G89.4 (ICD-10-CM) - Chronic pain syndrome  M47.819 (ICD-10-CM) - Facet arthropathy    Rationale for Evaluation and Treatment: Rehabilitation  THERAPY DIAG:  Other low back pain  Muscle weakness (generalized)  Unsteadiness on feet  ONSET DATE: 2013  SUBJECTIVE:  SUBJECTIVE STATEMENT: Pt reports the weather has affected her. Pt reports she did her exercises on cruise ship 2x.  She had 10+ hr drive home.  Sciatica has been bothering her ever since her return.       Initial Subjective: 2 back surgeries in neck and LB.  I am having problems with both of my legs if I walk to long my legs feel like they are going to give on me. Muscle will shake and feel weak.  I will have radiating pain down both legs to feet.  Grocery shopping makes it hurt.  I come home and lie down for about an hour and it gets better. I have permanent muscle atrophy in back from a prior injury. Waking x 2 night at least.  I am off balance  constantly.  I don't trust my head I will get dizzy, my balance is bad  PERTINENT HISTORY:  Facet arthropathy; insomnia; chronic Pain Syndrome Pituitary Microadenoma  PAIN:  Are you having pain? Yes: NPRS scale: current 8/10 Pain location: generalized, R LB down RLE to calf Pain description: ache, P&N, sharp constant Aggravating factors: movement, walk ~ 30 minutes; standing <10 minutes Relieving factors: trigger point shots, ablations at least 1 x year,  PRECAUTIONS: Fall  RED FLAGS: None   WEIGHT BEARING RESTRICTIONS: No  FALLS:  Has patient fallen in last 6 months? Yes. Number of falls 2 legs give out.  LIVING ENVIRONMENT: Lives with: lives with their family, lives with their spouse, and lives with their son Lives in: House/apartment Stairs: Yes: External: 9 steps; on right going up Has following equipment at home: Single point cane, Walker - 2 wheeled, and scooter; tub bench; grab bars  OCCUPATION: disability x 10 yrs  PLOF: Requires assistive device for independence  PATIENT GOALS: decrease pain, improve leg strength, improve balance  NEXT MD VISIT: every 3 months  OBJECTIVE:  Note: Objective measures were completed at Evaluation unless otherwise noted.  DIAGNOSTIC FINDINGS:  None relevant in chart  PATIENT SURVEYS:  Modified Oswestry 56%   COGNITION: Overall cognitive status: Within functional limits for tasks assessed     SENSATION: WFL  MUSCLE LENGTH: Hamstrings: tightness bilaterally tested in sitting  POSTURE: rounded shoulders and forward head  PALPATION: TTP lumbar paraspinals and into bilateral hips to trochanter snd sciatic notches.  LUMBAR ROM:   AROM eval  Flexion FT to below patella  Extension full  Right lateral flexion 50% limited  Left lateral flexion 50% limited  Right rotation   Left rotation    (Blank rows = not tested)  LOWER EXTREMITY ROM:     wfl  LOWER EXTREMITY MMT:    MMT Right eval Left eval  Hip flexion  29.4 33.3  Hip extension    Hip abduction 22.8 29.4  Hip adduction    Hip internal rotation    Hip external rotation    Knee flexion    Knee extension 22.9 16.9  Ankle dorsiflexion    Ankle plantarflexion    Ankle inversion    Ankle eversion     (Blank rows = not tested)  LUMBAR SPECIAL TESTS:  Slump test: Positive  FUNCTIONAL TESTS:  Timed up and go (TUG): 15.07 complete with cane BERG Balance Test          Date: 12/26/23  Sit to Stand 2  Standing unsupported 3  Sitting with back unsupported but feet supported 4  Stand to sit  2  Transfers  3  Standing unsupported with eyes  closed 2  Standing unsupported feet together 2  From standing position, reach forward with outstretched arm 3  From standing position, pick up object from floor 1  From standing position, turn and look behind over each shoulder 1  Turn 360 1  Standing unsupported, alternately place foot on step 0  Standing unsupported, one foot in front 0  Standing on one leg 0  Total:  24/56     GAIT: Distance walked: 400 ft Assistive device utilized: Single point cane Level of assistance:  requires ad safety Comments: slow cadence and decreased step length, guarded position  TREATMENT  [facility] Adult PT Treatment:                                                DATE: 01/31/24  Pt seen for aquatic therapy today.  Treatment took place in water 3.5-4.75 ft in depth at the [facility] pool. Temp of water was 91.  Pt entered/exited the pool via stairs independently with bilat rail.  - unsupported: walking forward/ backward x 5 laps - UE on wall: ; hip add/abd 2 x 5 (some burning in R adductors with R hip abdct) ; straight leg hip circles, x5 CW/CCW each LE;   alternating single leg clams x 5 each LE - side stepping with UE on yellow hand floats x 1 lap; with arm addct/ abdct x 2 laps, cues to relax knees - wide stance with bilat horiz abdct/ addct with no floats x 10 - UE on wall:  relaxed squats x 10; "L"  stretch x 15;  bilat calf stretch x 20s;  wall push up/off x 10 for reactive balance; hip bumps x 10 - tandem gait forward/ backward with rainbow hand floats -- straddling noodle, UE on corner-> no UE support cycling.   Pt requires the buoyancy and hydrostatic pressure of water for support, and to offload joints by unweighting joint load by at least 50 % in navel deep water and by at least 75-80% in chest to neck deep water.  Viscosity of the water is needed for resistance of strengthening. Water current perturbations provides challenge to standing balance requiring increased core activation.   PATIENT EDUCATION:  Education details: intro to aquatic therapy  Person educated: Patient Education method: Explanation Education comprehension: verbalized understanding  HOME EXERCISE PROGRAM: AQUATIC Access Code: [email or web address] URL: [email or web address] Date: 01/06/2024 Prepared by: [facility] [address] This aquatic home exercise program from MedBridge utilizes pictures from land based exercises, but has been adapted prior to lamination and issuance.     ASSESSMENT:  CLINICAL IMPRESSION: Pt reported reduction of radicular symptoms in RLE during session. Pt able to cycle across width of pool with good balance and no UE support. Pt issued list of area pools; will begin searching for pool (besides outdoor community pool) that she may gain access that is heated.   Goals are ongoing. Assess short term goals next session.   Initial impression: Patient is a 58 y.o. f who was seen today for physical therapy evaluation and treatment for chronic pain syndrome/LBP.  She presents today amb with cane.  She is limited with all functional mobility and ADL's.  Uses shower bench to bath, unable to tolerate standing and cooking/cleaning.  Difficulty getting through grocery stores.  She has constant pain in LB with radicular issues bilaterally to feet. NCV  le completed with normal  results. Pain and nerve deficits likely multifactorial (see PmHx). She tests positive for bilateral le strength deficits, balance dysfunction/high fall risk and limited due to pain with all activities She will benefit from skilled PT intervention to improve management of chronic pain/conditions and improve safety with all ADL's and functional mobility.  Will bgin in aquatics where she will benefit from the properties of water to progress towards stated goals then will transition onto land based at able/tolerated.  OBJECTIVE IMPAIRMENTS: Abnormal gait, decreased activity tolerance, decreased balance, decreased endurance, decreased mobility, difficulty walking, decreased strength, improper body mechanics, postural dysfunction, obesity, and pain.   ACTIVITY LIMITATIONS: carrying, lifting, bending, sitting, standing, squatting, sleeping, stairs, transfers, bathing, and locomotion level  PARTICIPATION LIMITATIONS: meal prep, cleaning, laundry, driving, shopping, community activity, and yard work  PERSONAL FACTORS: Time since onset of injury/illness/exacerbation and 3+ comorbidities: see PmHx  are also affecting patient's functional outcome.   REHAB POTENTIAL: Good  CLINICAL DECISION MAKING: Unstable/unpredictable  EVALUATION COMPLEXITY: High   GOALS: Goals reviewed with patient? Yes  SHORT TERM GOALS: Target date: 01/15/24  Pt will tolerate full aquatic sessions consistently without increase in pain and with improving function to demonstrate good toleration and effectiveness of intervention.  Baseline: Goal status: INITIAL  2.  Pt will tolerate walking to and from setting (500 ft each way) and full aquatic session without excessive fatigue or pain Baseline: TBA Goal status: INITIAL  3.  Pt will report a decrease in pain while submerged to 5/10 or less with residual relief for 2 hours after session Baseline: TBA Goal status: INITIAL    LONG TERM GOALS: Target date: 02/24/24  Pt to  improve on ODI to 43% (MCID) to demonstrate statistically significant Improvement in function. Baseline: 28/50+56% Goal status: INITIAL  2.  .Pt will improve strength in all areas listed by up towards 10 LB or > to demonstrate improved overall physical function Baseline: see chart Goal status: INITIAL  3.  Pt will improve on Tug test to <or=  13s to demonstrate improvement in lower extremity function, mobility and decreased fall risk. Baseline:  Goal status: INITIAL  4.  Pt will be indep with final HEP's (land and aquatic as appropriate) for continued management of condition Baseline: none Goal status: INITIAL  5.  Pt will improve on Berg balance test to >/= 42/56 to demonstrate a decrease in fall risk. Baseline: 24/56 Goal status: INITIAL  6.  Pt will report decrease in pain by at least 50% for improved toleration to activity/quality of life and to demonstrate improved management of pain. Baseline: see chart Goal status: INITIAL  PLAN:  PT FREQUENCY: 2x/week  PT DURATION: 8 weeks  PLANNED INTERVENTIONS: 97164- PT Re-evaluation, 97110-Therapeutic exercises, 97530- Therapeutic activity, 97112- Neuromuscular re-education, 97535- Self Care, 16109- Manual therapy, [phone]- Gait training, [phone]- Orthotic Fit/training, [phone]- Aquatic Therapy, [phone]- Electrical stimulation (unattended), [phone]- Ionotophoresis 4mg /ml Dexamethasone , Patient/Family education, Balance training, Stair training, Taping, Dry Needling, Joint mobilization, DME instructions, Cryotherapy, and Moist heat.  PLAN FOR NEXT SESSION: aquatic- 6 weeks onlyt: ROM/strengthening, stretching, pain management, toleration to activity, instruction on benefits of aquatic exercise. Land HEP without using exercise machines for core and LE.  Almedia Jacobsen, PTA 01/31/24 11:56 AM [facility] [address] [place], [place], [address] Phone: [phone]   Fax:  [phone]

## 2024-02-03 ENCOUNTER — Encounter: Admitting: Physical Medicine and Rehabilitation

## 2024-02-03 ENCOUNTER — Encounter (HOSPITAL_BASED_OUTPATIENT_CLINIC_OR_DEPARTMENT_OTHER): Payer: Self-pay | Admitting: Physical Therapy

## 2024-02-03 ENCOUNTER — Ambulatory Visit (HOSPITAL_BASED_OUTPATIENT_CLINIC_OR_DEPARTMENT_OTHER): Admitting: Physical Therapy

## 2024-02-03 DIAGNOSIS — M5459 Other low back pain: Secondary | ICD-10-CM | POA: Diagnosis not present

## 2024-02-03 DIAGNOSIS — R2681 Unsteadiness on feet: Secondary | ICD-10-CM

## 2024-02-03 DIAGNOSIS — M6281 Muscle weakness (generalized): Secondary | ICD-10-CM

## 2024-02-03 NOTE — Therapy (Signed)
 OUTPATIENT PHYSICAL THERAPY THORACOLUMBAR TREATMENT   Patient Name: Pamela Esparza MRN: [id number] DOB:1966/03/06, 58 y.o., female Today's Date: 02/03/2024  END OF SESSION:  PT End of Session - 02/03/24 1052     Visit Number 5    Number of Visits 16    Date for PT Re-Evaluation 02/24/24    Authorization Type [employer]    PT Start Time 1015    PT Stop Time 1053    PT Time Calculation (min) 38 min    Activity Tolerance Patient tolerated treatment well    Behavior During Therapy WFL for tasks assessed/performed              Past Medical History:  Diagnosis Date   Allergy    Topiramate, Lyrica , aspirin (sensitive to)   Anxiety    Arthritis    Asthma    Cataract    DDD (degenerative disc disease), lumbar    Depression    Depression    Phreesia 10/13/2020   Family history of bladder cancer    Family history of brain cancer 02/15/2023   Fibromyalgia    Hyperlipidemia    Joint pain    Neuromuscular disorder (HCC)    Phreesia 10/13/2020   Neuropathy    Osteoporosis    Sjogren's disease (HCC)    Thyroid  disease    Past Surgical History:  Procedure Laterality Date   ANKLE SURGERY Right    APPENDECTOMY     CHOLECYSTECTOMY     ESOPHAGOGASTRODUODENOSCOPY (EGD) WITH PROPOFOL  N/A 12/05/2020   Procedure: ESOPHAGOGASTRODUODENOSCOPY (EGD) WITH PROPOFOL ;  Surgeon: Irby Mannan, MD;  Location: [facility];  Service: Endoscopy;  Laterality: N/A;   finger sugery     NECK SURGERY     SPINE SURGERY     On my neck   TENDON TRANSFER Right 11/06/2019   Procedure: PERONEAL RECONSTRUCTION WITH PERONEAL LONGUS RIGHT LEG;  Surgeon: Timothy Ford, MD;  Location: [facility];  Service: Orthopedics;  Laterality: Right;   TOTAL ABDOMINAL HYSTERECTOMY     TUBAL LIGATION N/A    Phreesia 10/13/2020   Patient Active Problem List   Diagnosis Date Noted   Polyp of colon 01/27/2023   Hyperlipidemia 04/28/2022   Pituitary microadenoma (HCC) 09/14/2021   Primary  osteoarthritis 09/03/2021   Sjogren's disease (HCC) 04/16/2021   Spondylosis of lumbar region without myelopathy or radiculopathy 02/04/2021   Essential (primary) hypertension 02/02/2021   Class 3 severe obesity due to excess calories with serious comorbidity and body mass index (BMI) of 45.0 to 49.9 in adult 02/02/2021   Chronic pain syndrome 01/12/2021   Peroneal tendinitis, right leg    Multiple atypical nevi 08/14/2019   Haglund's deformity of right heel 06/25/2019   Hypothyroidism 03/21/2019   Chronic joint pain 10/23/2018   DDD (degenerative disc disease), lumbar 10/23/2018   Muscle pain, fibromyalgia 10/23/2018   Lumbosacral spondylosis without myelopathy 08/20/2015   Neuropathy 07/11/2013    PCP: Maryclare Smoke PA  REFERRING PROVIDER: Liam Redhead, MD   REFERRING DIAG:  G89.4 (ICD-10-CM) - Chronic pain syndrome  M47.819 (ICD-10-CM) - Facet arthropathy    Rationale for Evaluation and Treatment: Rehabilitation  THERAPY DIAG:  Other low back pain  Muscle weakness (generalized)  Unsteadiness on feet  ONSET DATE: 2013  SUBJECTIVE:  SUBJECTIVE STATEMENT: Pt reports she has been spring cleaning.  Has a headache.  "I'm having a good day today".     Initial Subjective: 2 back surgeries in neck and LB.  I am having problems with both of my legs if I walk to long my legs feel like they are going to give on me. Muscle will shake and feel weak.  I will have radiating pain down both legs to feet.  Grocery shopping makes it hurt.  I come home and lie down for about an hour and it gets better. I have permanent muscle atrophy in back from a prior injury. Waking x 2 night at least.  I am off balance constantly.  I don't trust my head I will get dizzy, my balance is bad  PERTINENT HISTORY:   Facet arthropathy; insomnia; chronic Pain Syndrome Pituitary Microadenoma  PAIN:  Are you having pain? Yes: NPRS scale: current 4/10 Pain location: generalized, R LB down RLE to calf Pain description: ache, P&N, sharp constant Aggravating factors: movement, walk ~ 30 minutes; standing <10 minutes Relieving factors: trigger point shots, ablations at least 1 x year,  PRECAUTIONS: Fall  RED FLAGS: None   WEIGHT BEARING RESTRICTIONS: No  FALLS:  Has patient fallen in last 6 months? Yes. Number of falls 2 legs give out.  LIVING ENVIRONMENT: Lives with: lives with their family, lives with their spouse, and lives with their son Lives in: House/apartment Stairs: Yes: External: 9 steps; on right going up Has following equipment at home: Single point cane, Walker - 2 wheeled, and scooter; tub bench; grab bars  OCCUPATION: disability x 10 yrs  PLOF: Requires assistive device for independence  PATIENT GOALS: decrease pain, improve leg strength, improve balance  NEXT MD VISIT: every 3 months  OBJECTIVE:  Note: Objective measures were completed at Evaluation unless otherwise noted.  DIAGNOSTIC FINDINGS:  None relevant in chart  PATIENT SURVEYS:  Modified Oswestry 56%   COGNITION: Overall cognitive status: Within functional limits for tasks assessed     SENSATION: WFL  MUSCLE LENGTH: Hamstrings: tightness bilaterally tested in sitting  POSTURE: rounded shoulders and forward head  PALPATION: TTP lumbar paraspinals and into bilateral hips to trochanter snd sciatic notches.  LUMBAR ROM:   AROM eval  Flexion FT to below patella  Extension full  Right lateral flexion 50% limited  Left lateral flexion 50% limited  Right rotation   Left rotation    (Blank rows = not tested)  LOWER EXTREMITY ROM:     wfl  LOWER EXTREMITY MMT:    MMT Right eval Left eval  Hip flexion 29.4 33.3  Hip extension    Hip abduction 22.8 29.4  Hip adduction    Hip internal rotation     Hip external rotation    Knee flexion    Knee extension 22.9 16.9  Ankle dorsiflexion    Ankle plantarflexion    Ankle inversion    Ankle eversion     (Blank rows = not tested)  LUMBAR SPECIAL TESTS:  Slump test: Positive  FUNCTIONAL TESTS:  Timed up and go (TUG): 15.07 complete with cane BERG Balance Test          Date: 12/26/23  Sit to Stand 2  Standing unsupported 3  Sitting with back unsupported but feet supported 4  Stand to sit  2  Transfers  3  Standing unsupported with eyes closed 2  Standing unsupported feet together 2  From standing position, reach forward with outstretched arm 3  From standing position, pick up object from floor 1  From standing position, turn and look behind over each shoulder 1  Turn 360 1  Standing unsupported, alternately place foot on step 0  Standing unsupported, one foot in front 0  Standing on one leg 0  Total:  24/56     GAIT: Distance walked: 400 ft Assistive device utilized: Single point cane Level of assistance: requires ad safety Comments: slow cadence and decreased step length, guarded position  TREATMENT  [facility] Adult PT Treatment:                                                DATE: 02/03/24  Pt seen for aquatic therapy today.  Treatment took place in water 3.5-4.75 ft in depth at the [facility] pool. Temp of water was 91.  Pt entered/exited the pool via stairs independently with bilat rail.  - unsupported: walking forward/ backward multiple laps  - side stepping with arm addct/ abdct with yellow hand floats, multiple laps - UE on wall: ; hip add/abd 2 x 5 (no burning in R hip with abdct) - tandem gait forward/ backward with blue hand floats (improved) - UE on wall:  "L" stretch x 15;  bilat calf stretch x 20s;  wall push up/off x 10 for reactive balance - SLS in 51ft6" hands in water:  LLE 20s, RLE 5s - alternating toe taps to 1st step, then 1st-2nd -- without UE support (intermittent when tapping 2nd step)   -  alternating forward step ups/ retro step downs with BUE on rails x 5 each LE - TUG like exercise from bench in water x 4 (reported increased pain in LEs) -- straddling noodle, no UE support: cycling.    PATIENT EDUCATION:  Education details:aquatic therapy progressions/ modifications Person educated: Patient Education method: Explanation Education comprehension: verbalized understanding  HOME EXERCISE PROGRAM: AQUATIC Access Code: [email or web address] URL: [email or web address] Date: 01/06/2024 Prepared by: [facility] [address] [address] This aquatic home exercise program from [employer] utilizes pictures from land based exercises, but has been adapted prior to lamination and issuance.     ASSESSMENT:  CLINICAL IMPRESSION: Pt reporting good tolerance to walking to/from pool area.  She reports she sometimes has excessive fatigue or pain after session, depending on exercises and weather.  Trialed additional balance exercises today; continued decreased balance on RLE.  She reported increased RLE and hip pain at end of session after completing stairs.  Will plan to trial STS on bench with step under feet or from stairs next session. Pt has partially met her STGs.    Initial impression: Patient is a 58 y.o. f who was seen today for physical therapy evaluation and treatment for chronic pain syndrome/LBP.  She presents today amb with cane.  She is limited with all functional mobility and ADL's.  Uses shower bench to bath, unable to tolerate standing and cooking/cleaning.  Difficulty getting through grocery stores.  She has constant pain in LB with radicular issues bilaterally to feet. NCV le completed with normal results. Pain and nerve deficits likely multifactorial (see PmHx). She tests positive for bilateral le strength deficits, balance dysfunction/high fall risk and limited due to pain with all activities She will benefit from skilled PT intervention to improve management of  chronic pain/conditions and improve safety with all ADL's and functional mobility.  Will bgin  in aquatics where she will benefit from the properties of water to progress towards stated goals then will transition onto land based at able/tolerated.  OBJECTIVE IMPAIRMENTS: Abnormal gait, decreased activity tolerance, decreased balance, decreased endurance, decreased mobility, difficulty walking, decreased strength, improper body mechanics, postural dysfunction, obesity, and pain.   ACTIVITY LIMITATIONS: carrying, lifting, bending, sitting, standing, squatting, sleeping, stairs, transfers, bathing, and locomotion level  PARTICIPATION LIMITATIONS: meal prep, cleaning, laundry, driving, shopping, community activity, and yard work  PERSONAL FACTORS: Time since onset of injury/illness/exacerbation and 3+ comorbidities: see PmHx are also affecting patient's functional outcome.   REHAB POTENTIAL: Good  CLINICAL DECISION MAKING: Unstable/unpredictable  EVALUATION COMPLEXITY: High   GOALS: Goals reviewed with patient? Yes  SHORT TERM GOALS: Target date: 01/15/24  Pt will tolerate full aquatic sessions consistently without increase in pain and with improving function to demonstrate good toleration and effectiveness of intervention.  Baseline: Goal status: Met - 02/03/24  2.  Pt will tolerate walking to and from setting (500 ft each way) and full aquatic session without excessive fatigue or pain Baseline: can walk to/from; excessive fatigue and pain if bad weather Goal status: Partially met - 02/03/24  3.  Pt will report a decrease in pain while submerged to 5/10 or less with residual relief for 2 hours after session Baseline: TBA Goal status: INITIAL    LONG TERM GOALS: Target date: 02/24/24  Pt to improve on ODI to 43% (MCID) to demonstrate statistically significant Improvement in function. Baseline: 28/50+56% Goal status: INITIAL  2.  .Pt will improve strength in all areas listed by up  towards 10 LB or > to demonstrate improved overall physical function Baseline: see chart Goal status: INITIAL  3.  Pt will improve on Tug test to <or=  13s to demonstrate improvement in lower extremity function, mobility and decreased fall risk. Baseline:  Goal status: INITIAL  4.  Pt will be indep with final HEP's (land and aquatic as appropriate) for continued management of condition Baseline: none Goal status: INITIAL  5.  Pt will improve on Berg balance test to >/= 42/56 to demonstrate a decrease in fall risk. Baseline: 24/56 Goal status: INITIAL  6.  Pt will report decrease in pain by at least 50% for improved toleration to activity/quality of life and to demonstrate improved management of pain. Baseline: see chart Goal status: INITIAL  PLAN:  PT FREQUENCY: 2x/week  PT DURATION: 8 weeks  PLANNED INTERVENTIONS: 97164- PT Re-evaluation, 97110-Therapeutic exercises, 97530- Therapeutic activity, 97112- Neuromuscular re-education, 97535- Self Care, 36644- Manual therapy, [phone]- Gait training, [phone]- Orthotic Fit/training, [phone]- Aquatic Therapy, [phone]- Electrical stimulation (unattended), [phone]- Ionotophoresis 4mg /ml Dexamethasone , Patient/Family education, Balance training, Stair training, Taping, Dry Needling, Joint mobilization, DME instructions, Cryotherapy, and Moist heat.  PLAN FOR NEXT SESSION: aquatic- 6 weeks onlyt: ROM/strengthening, stretching, pain management, toleration to activity, instruction on benefits of aquatic exercise. Land HEP without using exercise machines for core and LE.  Almedia Jacobsen, PTA 02/03/24 10:53 AM [facility] [address] [place], [place], [address] Phone: [phone]   Fax:  [phone]

## 2024-02-03 NOTE — Progress Notes (Signed)
 Left VM to discuss XR results

## 2024-02-07 ENCOUNTER — Ambulatory Visit (HOSPITAL_BASED_OUTPATIENT_CLINIC_OR_DEPARTMENT_OTHER): Admitting: Physical Therapy

## 2024-02-07 ENCOUNTER — Encounter (HOSPITAL_BASED_OUTPATIENT_CLINIC_OR_DEPARTMENT_OTHER): Payer: Self-pay | Admitting: Physical Therapy

## 2024-02-07 DIAGNOSIS — R2681 Unsteadiness on feet: Secondary | ICD-10-CM

## 2024-02-07 DIAGNOSIS — M5459 Other low back pain: Secondary | ICD-10-CM

## 2024-02-07 DIAGNOSIS — M6281 Muscle weakness (generalized): Secondary | ICD-10-CM

## 2024-02-07 NOTE — Therapy (Signed)
 OUTPATIENT PHYSICAL THERAPY THORACOLUMBAR TREATMENT   Patient Name: Pamela Esparza MRN: [id number] DOB:16-Mar-1966, 58 y.o., female Today's Date: 02/07/2024  END OF SESSION:  PT End of Session - 02/07/24 1015     Visit Number 6    Number of Visits 16    Date for PT Re-Evaluation 02/24/24    Authorization Type BC[employer]    PT Start Time 1018    PT Stop Time 1057    PT Time Calculation (min) 39 min    Activity Tolerance Patient tolerated treatment well    Behavior During Therapy WFL for tasks assessed/performed              Past Medical History:  Diagnosis Date   Allergy    Topiramate, Lyrica , aspirin (sensitive to)   Anxiety    Arthritis    Asthma    Cataract    DDD (degenerative disc disease), lumbar    Depression    Depression    Phreesia 10/13/2020   Family history of bladder cancer    Family history of brain cancer 02/15/2023   Fibromyalgia    Hyperlipidemia    Joint pain    Neuromuscular disorder (HCC)    Phreesia 10/13/2020   Neuropathy    Osteoporosis    Sjogren's disease (HCC)    Thyroid  disease    Past Surgical History:  Procedure Laterality Date   ANKLE SURGERY Right    APPENDECTOMY     CHOLECYSTECTOMY     ESOPHAGOGASTRODUODENOSCOPY (EGD) WITH PROPOFOL  N/A 12/05/2020   Procedure: ESOPHAGOGASTRODUODENOSCOPY (EGD) WITH PROPOFOL ;  Surgeon: Irby Mannan, MD;  Location: [facility];  Service: Endoscopy;  Laterality: N/A;   finger sugery     NECK SURGERY     SPINE SURGERY     On my neck   TENDON TRANSFER Right 11/06/2019   Procedure: PERONEAL RECONSTRUCTION WITH PERONEAL LONGUS RIGHT LEG;  Surgeon: Timothy Ford, MD;  Location: [facility];  Service: Orthopedics;  Laterality: Right;   TOTAL ABDOMINAL HYSTERECTOMY     TUBAL LIGATION N/A    Phreesia 10/13/2020   Patient Active Problem List   Diagnosis Date Noted   Polyp of colon 01/27/2023   Hyperlipidemia 04/28/2022   Pituitary microadenoma (HCC) 09/14/2021   Primary  osteoarthritis 09/03/2021   Sjogren's disease (HCC) 04/16/2021   Spondylosis of lumbar region without myelopathy or radiculopathy 02/04/2021   Essential (primary) hypertension 02/02/2021   Class 3 severe obesity due to excess calories with serious comorbidity and body mass index (BMI) of 45.0 to 49.9 in adult 02/02/2021   Chronic pain syndrome 01/12/2021   Peroneal tendinitis, right leg    Multiple atypical nevi 08/14/2019   Haglund's deformity of right heel 06/25/2019   Hypothyroidism 03/21/2019   Chronic joint pain 10/23/2018   DDD (degenerative disc disease), lumbar 10/23/2018   Muscle pain, fibromyalgia 10/23/2018   Lumbosacral spondylosis without myelopathy 08/20/2015   Neuropathy 07/11/2013    PCP: Maryclare Smoke PA  REFERRING PROVIDER: Liam Redhead, MD   REFERRING DIAG:  G89.4 (ICD-10-CM) - Chronic pain syndrome  M47.819 (ICD-10-CM) - Facet arthropathy    Rationale for Evaluation and Treatment: Rehabilitation  THERAPY DIAG:  Other low back pain  Muscle weakness (generalized)  Unsteadiness on feet  ONSET DATE: 2013  SUBJECTIVE:  SUBJECTIVE STATEMENT: Pt reports she is in a flare due to weather which usually effects her.  Has been in bed last 3 day".     Initial Subjective: 2 back surgeries in neck and LB.  I am having problems with both of my legs if I walk to long my legs feel like they are going to give on me. Muscle will shake and feel weak.  I will have radiating pain down both legs to feet.  Grocery shopping makes it hurt.  I come home and lie down for about an hour and it gets better. I have permanent muscle atrophy in back from a prior injury. Waking x 2 night at least.  I am off balance constantly.  I don't trust my head I will get dizzy, my balance is bad  PERTINENT  HISTORY:  Facet arthropathy; insomnia; chronic Pain Syndrome Pituitary Microadenoma  PAIN:  Are you having pain? Yes: NPRS scale: current 9/10 Pain location: generalized, R LB down RLE to calf Pain description: ache, P&N, sharp constant Aggravating factors: movement, walk ~ 30 minutes; standing <10 minutes Relieving factors: trigger point shots, ablations at least 1 x year,  PRECAUTIONS: Fall  RED FLAGS: None   WEIGHT BEARING RESTRICTIONS: No  FALLS:  Has patient fallen in last 6 months? Yes. Number of falls 2 legs give out.  LIVING ENVIRONMENT: Lives with: lives with their family, lives with their spouse, and lives with their son Lives in: House/apartment Stairs: Yes: External: 9 steps; on right going up Has following equipment at home: Single point cane, Walker - 2 wheeled, and scooter; tub bench; grab bars  OCCUPATION: disability x 10 yrs  PLOF: Requires assistive device for independence  PATIENT GOALS: decrease pain, improve leg strength, improve balance  NEXT MD VISIT: every 3 months  OBJECTIVE:  Note: Objective measures were completed at Evaluation unless otherwise noted.  DIAGNOSTIC FINDINGS:  None relevant in chart  PATIENT SURVEYS:  Modified Oswestry 56%   COGNITION: Overall cognitive status: Within functional limits for tasks assessed     SENSATION: WFL  MUSCLE LENGTH: Hamstrings: tightness bilaterally tested in sitting  POSTURE: rounded shoulders and forward head  PALPATION: TTP lumbar paraspinals and into bilateral hips to trochanter snd sciatic notches.  LUMBAR ROM:   AROM eval  Flexion FT to below patella  Extension full  Right lateral flexion 50% limited  Left lateral flexion 50% limited  Right rotation   Left rotation    (Blank rows = not tested)  LOWER EXTREMITY ROM:     wfl  LOWER EXTREMITY MMT:    MMT Right eval Left eval  Hip flexion 29.4 33.3  Hip extension    Hip abduction 22.8 29.4  Hip adduction    Hip internal  rotation    Hip external rotation    Knee flexion    Knee extension 22.9 16.9  Ankle dorsiflexion    Ankle plantarflexion    Ankle inversion    Ankle eversion     (Blank rows = not tested)  LUMBAR SPECIAL TESTS:  Slump test: Positive  FUNCTIONAL TESTS:  Timed up and go (TUG): 15.07 complete with cane BERG Balance Test          Date: 12/26/23  Sit to Stand 2  Standing unsupported 3  Sitting with back unsupported but feet supported 4  Stand to sit  2  Transfers  3  Standing unsupported with eyes closed 2  Standing unsupported feet together 2  From standing position, reach forward  with outstretched arm 3  From standing position, pick up object from floor 1  From standing position, turn and look behind over each shoulder 1  Turn 360 1  Standing unsupported, alternately place foot on step 0  Standing unsupported, one foot in front 0  Standing on one leg 0  Total:  24/56     GAIT: Distance walked: 400 ft Assistive device utilized: Single point cane Level of assistance: requires ad safety Comments: slow cadence and decreased step length, guarded position  TREATMENT  [facility] Adult PT Treatment:                                                DATE: 02/07/24  Pt seen for aquatic therapy today.  Treatment took place in water 3.5-4.75 ft in depth at the [facility] pool. Temp of water was 91.  Pt entered/exited the pool via stairs independently with bilat rail.  - unsupported: walking forward/ backward/side stepping multiple laps  - side stepping with arm addct/ abdct with yellow hand floats, multiple laps - UE on wall: ; hip add/abd; hip ext; toe raises; heel raises; relaxed squats x 12 - tandem gait forward/ backward with blue hand floats (improved)4.0.ft x4 - UE on wall:  "L" stretch x 15;  bilat calf stretch 3x 20s on bottom step;  -STS from water bench onto water step 2 x 5. Cues for execution and immediate standing balance. Completes 8/10 without LOB - SLS in 61ft6"  hands in water:  LLE 20s, RLE 5s - alternating toe taps to 1st step -- without UE support (intermittent )  Pt requires the buoyancy and hydrostatic pressure of water for support, and to offload joints by unweighting joint load by at least 50 % in navel deep water and by at least 75-80% in chest to neck deep water.  Viscosity of the water is needed for resistance of strengthening. Water current perturbations provides challenge to standing balance requiring increased core activation.    PATIENT EDUCATION:  Education details:aquatic therapy progressions/ modifications Person educated: Patient Education method: Explanation Education comprehension: verbalized understanding  HOME EXERCISE PROGRAM: AQUATIC Access Code: [email or web address] URL: [email or web address] Date: 01/06/2024 Prepared by: [facility] [address] This aquatic home exercise program from [employer] utilizes pictures from land based exercises, but has been adapted prior to lamination and issuance.     ASSESSMENT:  CLINICAL IMPRESSION: Pt reporting increase in overall pain since rainy weather began over w/e.  Pain sensitivity high today upon initiation of session 9/10.  Dialed session back to accommodate with fair toleration.  She gives good effort but requires multiple squatted rest periods. Plan to progress exercises as tolerated.  She will begin land based session in 2 weeks for instruction on HEP.  She will reach out to the [facility] and check if she has medicare benefit for covering membership for future pool access. No reduction in pain today.   Initial impression: Patient is a 58 y.o. f who was seen today for physical therapy evaluation and treatment for chronic pain syndrome/LBP.  She presents today amb with cane.  She is limited with all functional mobility and ADL's.  Uses shower bench to bath, unable to tolerate standing and cooking/cleaning.  Difficulty getting through grocery stores.  She has  constant pain in LB with radicular issues bilaterally to feet. NCV le completed with  normal results. Pain and nerve deficits likely multifactorial (see PmHx). She tests positive for bilateral le strength deficits, balance dysfunction/high fall risk and limited due to pain with all activities She will benefit from skilled PT intervention to improve management of chronic pain/conditions and improve safety with all ADL's and functional mobility.  Will bgin in aquatics where she will benefit from the properties of water to progress towards stated goals then will transition onto land based at able/tolerated.  OBJECTIVE IMPAIRMENTS: Abnormal gait, decreased activity tolerance, decreased balance, decreased endurance, decreased mobility, difficulty walking, decreased strength, improper body mechanics, postural dysfunction, obesity, and pain.   ACTIVITY LIMITATIONS: carrying, lifting, bending, sitting, standing, squatting, sleeping, stairs, transfers, bathing, and locomotion level  PARTICIPATION LIMITATIONS: meal prep, cleaning, laundry, driving, shopping, community activity, and yard work  PERSONAL FACTORS: Time since onset of injury/illness/exacerbation and 3+ comorbidities: see PmHx are also affecting patient's functional outcome.   REHAB POTENTIAL: Good  CLINICAL DECISION MAKING: Unstable/unpredictable  EVALUATION COMPLEXITY: High   GOALS: Goals reviewed with patient? Yes  SHORT TERM GOALS: Target date: 01/15/24  Pt will tolerate full aquatic sessions consistently without increase in pain and with improving function to demonstrate good toleration and effectiveness of intervention.  Baseline: Goal status: Met - 02/03/24  2.  Pt will tolerate walking to and from setting (500 ft each way) and full aquatic session without excessive fatigue or pain Baseline: can walk to/from; excessive fatigue and pain if bad weather Goal status: Partially met - 02/03/24  3.  Pt will report a decrease in pain while  submerged to 5/10 or less with residual relief for 2 hours after session Baseline: TBA Goal status: INITIAL    LONG TERM GOALS: Target date: 02/24/24  Pt to improve on ODI to 43% (MCID) to demonstrate statistically significant Improvement in function. Baseline: 28/50+56% Goal status: INITIAL  2.  .Pt will improve strength in all areas listed by up towards 10 LB or > to demonstrate improved overall physical function Baseline: see chart Goal status: INITIAL  3.  Pt will improve on Tug test to <or=  13s to demonstrate improvement in lower extremity function, mobility and decreased fall risk. Baseline:  Goal status: INITIAL  4.  Pt will be indep with final HEP's (land and aquatic as appropriate) for continued management of condition Baseline: none Goal status: INITIAL  5.  Pt will improve on Berg balance test to >/= 42/56 to demonstrate a decrease in fall risk. Baseline: 24/56 Goal status: INITIAL  6.  Pt will report decrease in pain by at least 50% for improved toleration to activity/quality of life and to demonstrate improved management of pain. Baseline: see chart Goal status: INITIAL  PLAN:  PT FREQUENCY: 2x/week  PT DURATION: 8 weeks  PLANNED INTERVENTIONS: 97164- PT Re-evaluation, 97110-Therapeutic exercises, 97530- Therapeutic activity, 97112- Neuromuscular re-education, 97535- Self Care, 45409- Manual therapy, [phone]- Gait training, [phone]- Orthotic Fit/training, [phone]- Aquatic Therapy, [phone]- Electrical stimulation (unattended), [phone]- Ionotophoresis 4mg /ml Dexamethasone , Patient/Family education, Balance training, Stair training, Taping, Dry Needling, Joint mobilization, DME instructions, Cryotherapy, and Moist heat.  PLAN FOR NEXT SESSION: aquatic- 6 weeks onlyt: ROM/strengthening, stretching, pain management, toleration to activity, instruction on benefits of aquatic exercise. Land HEP without using exercise machines for core and LE.  Adriana Hopping [place]) Kallin Henk MPT 02/07/24  10:27 AM [facility] [address] [place], [place], [address] Phone: [phone]   Fax:  [phone]

## 2024-02-10 ENCOUNTER — Ambulatory Visit (HOSPITAL_BASED_OUTPATIENT_CLINIC_OR_DEPARTMENT_OTHER): Admitting: Physical Therapy

## 2024-02-10 ENCOUNTER — Encounter (HOSPITAL_BASED_OUTPATIENT_CLINIC_OR_DEPARTMENT_OTHER): Payer: Self-pay | Admitting: Physical Therapy

## 2024-02-10 DIAGNOSIS — M5459 Other low back pain: Secondary | ICD-10-CM

## 2024-02-10 DIAGNOSIS — M6281 Muscle weakness (generalized): Secondary | ICD-10-CM

## 2024-02-10 DIAGNOSIS — R2681 Unsteadiness on feet: Secondary | ICD-10-CM

## 2024-02-10 NOTE — Therapy (Signed)
 OUTPATIENT PHYSICAL THERAPY THORACOLUMBAR TREATMENT   Patient Name: Pamela Esparza MRN: [id number] DOB:1966/07/19, 58 y.o., female Today's Date: 02/10/2024  END OF SESSION:  PT End of Session - 02/10/24 1019     Visit Number 7    Number of Visits 16    Date for PT Re-Evaluation 02/24/24    Authorization Type BC[employer]    PT Start Time 1015    PT Stop Time 1053    PT Time Calculation (min) 38 min    Behavior During Therapy WFL for tasks assessed/performed              Past Medical History:  Diagnosis Date   Allergy    Topiramate, Lyrica , aspirin (sensitive to)   Anxiety    Arthritis    Asthma    Cataract    DDD (degenerative disc disease), lumbar    Depression    Depression    Phreesia 10/13/2020   Family history of bladder cancer    Family history of brain cancer 02/15/2023   Fibromyalgia    Hyperlipidemia    Joint pain    Neuromuscular disorder (HCC)    Phreesia 10/13/2020   Neuropathy    Osteoporosis    Sjogren's disease (HCC)    Thyroid  disease    Past Surgical History:  Procedure Laterality Date   ANKLE SURGERY Right    APPENDECTOMY     CHOLECYSTECTOMY     ESOPHAGOGASTRODUODENOSCOPY (EGD) WITH PROPOFOL  N/A 12/05/2020   Procedure: ESOPHAGOGASTRODUODENOSCOPY (EGD) WITH PROPOFOL ;  Surgeon: Irby Mannan, MD;  Location: [facility];  Service: Endoscopy;  Laterality: N/A;   finger sugery     NECK SURGERY     SPINE SURGERY     On my neck   TENDON TRANSFER Right 11/06/2019   Procedure: PERONEAL RECONSTRUCTION WITH PERONEAL LONGUS RIGHT LEG;  Surgeon: Timothy Ford, MD;  Location: [facility];  Service: Orthopedics;  Laterality: Right;   TOTAL ABDOMINAL HYSTERECTOMY     TUBAL LIGATION N/A    Phreesia 10/13/2020   Patient Active Problem List   Diagnosis Date Noted   Polyp of colon 01/27/2023   Hyperlipidemia 04/28/2022   Pituitary microadenoma (HCC) 09/14/2021   Primary osteoarthritis 09/03/2021   Sjogren's disease (HCC)  04/16/2021   Spondylosis of lumbar region without myelopathy or radiculopathy 02/04/2021   Essential (primary) hypertension 02/02/2021   Class 3 severe obesity due to excess calories with serious comorbidity and body mass index (BMI) of 45.0 to 49.9 in adult 02/02/2021   Chronic pain syndrome 01/12/2021   Peroneal tendinitis, right leg    Multiple atypical nevi 08/14/2019   Haglund's deformity of right heel 06/25/2019   Hypothyroidism 03/21/2019   Chronic joint pain 10/23/2018   DDD (degenerative disc disease), lumbar 10/23/2018   Muscle pain, fibromyalgia 10/23/2018   Lumbosacral spondylosis without myelopathy 08/20/2015   Neuropathy 07/11/2013    PCP: Maryclare Smoke PA  REFERRING PROVIDER: Liam Redhead, MD   REFERRING DIAG:  G89.4 (ICD-10-CM) - Chronic pain syndrome  M47.819 (ICD-10-CM) - Facet arthropathy    Rationale for Evaluation and Treatment: Rehabilitation  THERAPY DIAG:  Other low back pain  Muscle weakness (generalized)  Unsteadiness on feet  ONSET DATE: 2013  SUBJECTIVE:  SUBJECTIVE STATEMENT: Pt reports she experiences relief for a couple hours after session.  "I'm feeling less off kilter" re: balance.     Initial Subjective: 2 back surgeries in neck and LB.  I am having problems with both of my legs if I walk to long my legs feel like they are going to give on me. Muscle will shake and feel weak.  I will have radiating pain down both legs to feet.  Grocery shopping makes it hurt.  I come home and lie down for about an hour and it gets better. I have permanent muscle atrophy in back from a prior injury. Waking x 2 night at least.  I am off balance constantly.  I don't trust my head I will get dizzy, my balance is bad  PERTINENT HISTORY:  Facet arthropathy; insomnia;  chronic Pain Syndrome Pituitary Microadenoma  PAIN:  Are you having pain? Yes: NPRS scale: current 5/10 Pain location: generalized, R LB down RLE to calf Pain description: ache, P&N, sharp constant Aggravating factors: movement, walk ~ 30 minutes; standing <10 minutes Relieving factors: trigger point shots, ablations at least 1 x year,  PRECAUTIONS: Fall  RED FLAGS: None   WEIGHT BEARING RESTRICTIONS: No  FALLS:  Has patient fallen in last 6 months? Yes. Number of falls 2 legs give out.  LIVING ENVIRONMENT: Lives with: lives with their family, lives with their spouse, and lives with their son Lives in: House/apartment Stairs: Yes: External: 9 steps; on right going up Has following equipment at home: Single point cane, Walker - 2 wheeled, and scooter; tub bench; grab bars  OCCUPATION: disability x 10 yrs  PLOF: Requires assistive device for independence  PATIENT GOALS: decrease pain, improve leg strength, improve balance  NEXT MD VISIT: every 3 months  OBJECTIVE:  Note: Objective measures were completed at Evaluation unless otherwise noted.  DIAGNOSTIC FINDINGS:  None relevant in chart  PATIENT SURVEYS:  Modified Oswestry 56%   COGNITION: Overall cognitive status: Within functional limits for tasks assessed     SENSATION: WFL  MUSCLE LENGTH: Hamstrings: tightness bilaterally tested in sitting  POSTURE: rounded shoulders and forward head  PALPATION: TTP lumbar paraspinals and into bilateral hips to trochanter snd sciatic notches.  LUMBAR ROM:   AROM eval  Flexion FT to below patella  Extension full  Right lateral flexion 50% limited  Left lateral flexion 50% limited  Right rotation   Left rotation    (Blank rows = not tested)  LOWER EXTREMITY ROM:     wfl  LOWER EXTREMITY MMT:    MMT Right eval Left eval  Hip flexion 29.4 33.3  Hip extension    Hip abduction 22.8 29.4  Hip adduction    Hip internal rotation    Hip external rotation     Knee flexion    Knee extension 22.9 16.9  Ankle dorsiflexion    Ankle plantarflexion    Ankle inversion    Ankle eversion     (Blank rows = not tested)  LUMBAR SPECIAL TESTS:  Slump test: Positive  FUNCTIONAL TESTS:  Timed up and go (TUG): 15.07 complete with cane BERG Balance Test          Date: 12/26/23  Sit to Stand 2  Standing unsupported 3  Sitting with back unsupported but feet supported 4  Stand to sit  2  Transfers  3  Standing unsupported with eyes closed 2  Standing unsupported feet together 2  From standing position, reach forward with outstretched arm  3  From standing position, pick up object from floor 1  From standing position, turn and look behind over each shoulder 1  Turn 360 1  Standing unsupported, alternately place foot on step 0  Standing unsupported, one foot in front 0  Standing on one leg 0  Total:  24/56     GAIT: Distance walked: 400 ft Assistive device utilized: Single point cane Level of assistance: requires ad safety Comments: slow cadence and decreased step length, guarded position  TREATMENT  [facility] Adult PT Treatment:                                                DATE: 02/10/24  Pt seen for aquatic therapy today.  Treatment took place in water 3.5-4.75 ft in depth at the [facility] pool. Temp of water was 91.  Pt entered/exited the pool via stairs independently with bilat rail.  - unsupported: walking forward/ backward multiple laps  - tandem gait forward/ backward with yellow hand floats -> hands out of water  - tandem stance, hands out of water, x 20s each  - side stepping with arm addct/ abdct with yellow hand floats 3 laps->rainbow x 1 lap,  - SLS in 4 ft - R/L  - UE on wall:  "L" stretch 2x 15;  - wall push up/off x 10 -STS from water bench onto water step x10. Cues for execution, core engagement, neutral head. Completes 9/10 without LOB - L stretch at wall - suspended cycling without UE support, (straddling  noodle) - supported back float with nekdoodle, and noodle under arms/legs - unsupported walking backwards -  bilat calf stretch 2x 20s on bottom step;    Pt requires the buoyancy and hydrostatic pressure of water for support, and to offload joints by unweighting joint load by at least 50 % in navel deep water and by at least 75-80% in chest to neck deep water.  Viscosity of the water is needed for resistance of strengthening. Water current perturbations provides challenge to standing balance requiring increased core activation.  PATIENT EDUCATION:  Education details:aquatic therapy progressions/ modifications Person educated: Patient Education method: Explanation Education comprehension: verbalized understanding  HOME EXERCISE PROGRAM: AQUATIC Access Code: [email or web address] URL: [email or web address] Date: 01/06/2024 Prepared by: [facility] [address] This aquatic home exercise program from [employer] utilizes pictures from land based exercises, but has been adapted prior to lamination and issuance.     ASSESSMENT:  CLINICAL IMPRESSION: Pt reported increased pain in back with STS today.  No relief with suspended cycling, but some relief with supported back float.  Balance improved in water today.  Plan to progress exercises as tolerated.  Limit forward step ups as that increased her pain when last performed.   She will reach out to the [facility] [employer] and check if she has medicare benefit for covering membership for future pool access.    Initial impression: Patient is a 58 y.o. f who was seen today for physical therapy evaluation and treatment for chronic pain syndrome/LBP.  She presents today amb with cane.  She is limited with all functional mobility and ADL's.  Uses shower bench to bath, unable to tolerate standing and cooking/cleaning.  Difficulty getting through grocery stores.  She has constant pain in LB with radicular issues bilaterally to feet. NCV le  completed with normal results. Pain and  nerve deficits likely multifactorial (see PmHx). She tests positive for bilateral le strength deficits, balance dysfunction/high fall risk and limited due to pain with all activities She will benefit from skilled PT intervention to improve management of chronic pain/conditions and improve safety with all ADL's and functional mobility.  Will bgin in aquatics where she will benefit from the properties of water to progress towards stated goals then will transition onto land based at able/tolerated.  OBJECTIVE IMPAIRMENTS: Abnormal gait, decreased activity tolerance, decreased balance, decreased endurance, decreased mobility, difficulty walking, decreased strength, improper body mechanics, postural dysfunction, obesity, and pain.   ACTIVITY LIMITATIONS: carrying, lifting, bending, sitting, standing, squatting, sleeping, stairs, transfers, bathing, and locomotion level  PARTICIPATION LIMITATIONS: meal prep, cleaning, laundry, driving, shopping, community activity, and yard work  PERSONAL FACTORS: Time since onset of injury/illness/exacerbation and 3+ comorbidities: see PmHx are also affecting patient's functional outcome.   REHAB POTENTIAL: Good  CLINICAL DECISION MAKING: Unstable/unpredictable  EVALUATION COMPLEXITY: High   GOALS: Goals reviewed with patient? Yes  SHORT TERM GOALS: Target date: 01/15/24  Pt will tolerate full aquatic sessions consistently without increase in pain and with improving function to demonstrate good toleration and effectiveness of intervention.  Baseline: Goal status: Met - 02/03/24  2.  Pt will tolerate walking to and from setting (500 ft each way) and full aquatic session without excessive fatigue or pain Baseline: can walk to/from; excessive fatigue and pain if bad weather Goal status: Partially met - 02/03/24  3.  Pt will report a decrease in pain while submerged to 5/10 or less with residual relief for 2 hours after  session Baseline: TBA Goal status: Partially met (depends on weather) - 02/10/24    LONG TERM GOALS: Target date: 02/24/24  Pt to improve on ODI to 43% (MCID) to demonstrate statistically significant Improvement in function. Baseline: 28/50+56% Goal status: INITIAL  2.  .Pt will improve strength in all areas listed by up towards 10 LB or > to demonstrate improved overall physical function Baseline: see chart Goal status: INITIAL  3.  Pt will improve on Tug test to <or=  13s to demonstrate improvement in lower extremity function, mobility and decreased fall risk. Baseline:  Goal status: INITIAL  4.  Pt will be indep with final HEP's (land and aquatic as appropriate) for continued management of condition Baseline: none Goal status: INITIAL  5.  Pt will improve on Berg balance test to >/= 42/56 to demonstrate a decrease in fall risk. Baseline: 24/56 Goal status: INITIAL  6.  Pt will report decrease in pain by at least 50% for improved toleration to activity/quality of life and to demonstrate improved management of pain. Baseline: see chart Goal status: INITIAL  PLAN:  PT FREQUENCY: 2x/week  PT DURATION: 8 weeks  PLANNED INTERVENTIONS: 97164- PT Re-evaluation, 97110-Therapeutic exercises, 97530- Therapeutic activity, 97112- Neuromuscular re-education, 97535- Self Care, 16109- Manual therapy, [phone]- Gait training, [id number]- Orthotic Fit/training, [phone]- Aquatic Therapy, [phone]- Electrical stimulation (unattended), [phone]- Ionotophoresis 4mg /ml Dexamethasone , Patient/Family education, Balance training, Stair training, Taping, Dry Needling, Joint mobilization, DME instructions, Cryotherapy, and Moist heat.  PLAN FOR NEXT SESSION: aquatic- 6 weeks onlyt: ROM/strengthening, stretching, pain management, toleration to activity, instruction on benefits of aquatic exercise. Land HEP without using exercise machines for core and LE.  Almedia Jacobsen, PTA 02/10/24 10:58 AM [facility] [address] [place], [place], [address] Phone: [phone]   Fax:  [phone]

## 2024-02-14 ENCOUNTER — Encounter (HOSPITAL_BASED_OUTPATIENT_CLINIC_OR_DEPARTMENT_OTHER): Payer: Self-pay

## 2024-02-14 ENCOUNTER — Ambulatory Visit (HOSPITAL_BASED_OUTPATIENT_CLINIC_OR_DEPARTMENT_OTHER): Admitting: Physical Therapy

## 2024-02-16 ENCOUNTER — Telehealth: Payer: Self-pay | Admitting: Physical Medicine and Rehabilitation

## 2024-02-16 ENCOUNTER — Telehealth: Payer: Self-pay

## 2024-02-16 NOTE — Telephone Encounter (Signed)
 P was calling to reschedule her missed phone visit with you where can she be placed?

## 2024-02-16 NOTE — Telephone Encounter (Signed)
 Copied from CRM [id number]. Topic: Appointments - Scheduling Inquiry for Clinic >> Feb 16, 2024  2:57 PM Sasha H wrote: Reason for CRM: Pt is due for a physical and need to be scheduled. Pt still has Britta Candy listed as PCP

## 2024-02-17 ENCOUNTER — Ambulatory Visit (HOSPITAL_BASED_OUTPATIENT_CLINIC_OR_DEPARTMENT_OTHER): Admitting: Physical Therapy

## 2024-02-17 ENCOUNTER — Encounter (HOSPITAL_BASED_OUTPATIENT_CLINIC_OR_DEPARTMENT_OTHER): Payer: Self-pay | Admitting: Physical Therapy

## 2024-02-17 DIAGNOSIS — M6281 Muscle weakness (generalized): Secondary | ICD-10-CM

## 2024-02-17 DIAGNOSIS — M5459 Other low back pain: Secondary | ICD-10-CM

## 2024-02-17 DIAGNOSIS — R2681 Unsteadiness on feet: Secondary | ICD-10-CM

## 2024-02-17 NOTE — Therapy (Signed)
 OUTPATIENT PHYSICAL THERAPY THORACOLUMBAR TREATMENT   Patient Name: Pamela Esparza MRN: [id number] DOB:27-May-1966, 58 y.o., female Today's Date: 02/17/2024  END OF SESSION:  PT End of Session - 02/17/24 1159     Visit Number 8    Number of Visits 16    Date for PT Re-Evaluation 02/24/24    Authorization Type [employer]    PT Start Time 1108    PT Stop Time 1144    PT Time Calculation (min) 36 min    Activity Tolerance Patient tolerated treatment well    Behavior During Therapy WFL for tasks assessed/performed               Past Medical History:  Diagnosis Date   Allergy    Topiramate, Lyrica , aspirin (sensitive to)   Anxiety    Arthritis    Asthma    Cataract    DDD (degenerative disc disease), lumbar    Depression    Depression    Phreesia 10/13/2020   Family history of bladder cancer    Family history of brain cancer 02/15/2023   Fibromyalgia    Hyperlipidemia    Joint pain    Neuromuscular disorder (HCC)    [employer]reesia 10/13/2020   Neuropathy    Osteoporosis    Sjogren's disease (HCC)    Thyroid  disease    Past Surgical History:  Procedure Laterality Date   ANKLE SURGERY Right    APPENDECTOMY     CHOLECYSTECTOMY     ESOPHAGOGASTRODUODENOSCOPY (EGD) WITH PROPOFOL  N/A 12/05/2020   Procedure: ESOPHAGOGASTRODUODENOSCOPY (EGD) WITH PROPOFOL ;  Surgeon: Irby Mannan, MD;  Location: [facility];  Service: Endoscopy;  Laterality: N/A;   finger sugery     NECK SURGERY     SPINE SURGERY     On my neck   TENDON TRANSFER Right 11/06/2019   Procedure: PERONEAL RECONSTRUCTION WITH PERONEAL LONGUS RIGHT LEG;  Surgeon: Timothy Ford, MD;  Location: [facility];  Service: Orthopedics;  Laterality: Right;   TOTAL ABDOMINAL HYSTERECTOMY     TUBAL LIGATION N/A    Phreesia 10/13/2020   Patient Active Problem List   Diagnosis Date Noted   Polyp of colon 01/27/2023   Hyperlipidemia 04/28/2022   Pituitary microadenoma (HCC) 09/14/2021   Primary  osteoarthritis 09/03/2021   Sjogren's disease (HCC) 04/16/2021   Spondylosis of lumbar region without myelopathy or radiculopathy 02/04/2021   Essential (primary) hypertension 02/02/2021   Class 3 severe obesity due to excess calories with serious comorbidity and body mass index (BMI) of 45.0 to 49.9 in adult 02/02/2021   Chronic pain syndrome 01/12/2021   Peroneal tendinitis, right leg    Multiple atypical nevi 08/14/2019   Haglund's deformity of right heel 06/25/2019   Hypothyroidism 03/21/2019   Chronic joint pain 10/23/2018   DDD (degenerative disc disease), lumbar 10/23/2018   Muscle pain, fibromyalgia 10/23/2018   Lumbosacral spondylosis without myelopathy 08/20/2015   Neuropathy 07/11/2013    PCP: Maryclare Smoke PA  REFERRING PROVIDER: Liam Redhead, MD   REFERRING DIAG:  G89.4 (ICD-10-CM) - Chronic pain syndrome  M47.819 (ICD-10-CM) - Facet arthropathy    Rationale for Evaluation and Treatment: Rehabilitation  THERAPY DIAG:  Other low back pain  Muscle weakness (generalized)  Unsteadiness on feet  ONSET DATE: 2013  SUBJECTIVE:  SUBJECTIVE STATEMENT: Pt states she was very sore after prior Rx.  She began have a "fibro flare" that evening or the following day.  Pt states she has been in the bed this week.  She couldn't make it to her PT appt on Tuesday due to how she was feeling.  Pt states the weather affects her bad.   Pt reports improved balance though no improvement in pain in her leg.     PERTINENT HISTORY:  Facet arthropathy; insomnia; chronic Pain Syndrome, fibromyalgia, Sjogren's syndrome Pituitary Microadenoma  PAIN:  Are you having pain? Yes: NPRS scale: current 7-8/10 Pain location: lumbar, pain down entire R LE to toes Pain description: ache, P&N, sharp  constant Aggravating factors: movement, walk ~ 30 minutes; standing <10 minutes Relieving factors: trigger point shots, ablations at least 1 x year,  PRECAUTIONS: Fall  RED FLAGS: None   WEIGHT BEARING RESTRICTIONS: No  FALLS:  Has patient fallen in last 6 months? Yes. Number of falls 2 legs give out.  LIVING ENVIRONMENT: Lives with: lives with their family, lives with their spouse, and lives with their son Lives in: House/apartment Stairs: Yes: External: 9 steps; on right going up Has following equipment at home: Single point cane, Walker - 2 wheeled, and scooter; tub bench; grab bars  OCCUPATION: disability x 10 yrs  PLOF: Requires assistive device for independence  PATIENT GOALS: decrease pain, improve leg strength, improve balance  NEXT MD VISIT: every 3 months  OBJECTIVE:  Note: Objective measures were completed at Evaluation unless otherwise noted.  DIAGNOSTIC FINDINGS:  None relevant in chart  PATIENT SURVEYS:  Modified Oswestry 56%   COGNITION: Overall cognitive status: Within functional limits for tasks assessed     SENSATION: WFL  MUSCLE LENGTH: Hamstrings: tightness bilaterally tested in sitting  POSTURE: rounded shoulders and forward head  PALPATION: TTP lumbar paraspinals and into bilateral hips to trochanter snd sciatic notches.  LUMBAR ROM:   AROM eval  Flexion FT to below patella  Extension full  Right lateral flexion 50% limited  Left lateral flexion 50% limited  Right rotation   Left rotation    (Blank rows = not tested)  LOWER EXTREMITY ROM:     wfl  LOWER EXTREMITY MMT:    MMT Right eval Left eval  Hip flexion 29.4 33.3  Hip extension    Hip abduction 22.8 29.4  Hip adduction    Hip internal rotation    Hip external rotation    Knee flexion    Knee extension 22.9 16.9  Ankle dorsiflexion    Ankle plantarflexion    Ankle inversion    Ankle eversion     (Blank rows = not tested)  LUMBAR SPECIAL TESTS:  Slump test:  Positive  FUNCTIONAL TESTS:  Timed up and go (TUG): 15.07 complete with cane BERG Balance Test          Date: 12/26/23  Sit to Stand 2  Standing unsupported 3  Sitting with back unsupported but feet supported 4  Stand to sit  2  Transfers  3  Standing unsupported with eyes closed 2  Standing unsupported feet together 2  From standing position, reach forward with outstretched arm 3  From standing position, pick up object from floor 1  From standing position, turn and look behind over each shoulder 1  Turn 360 1  Standing unsupported, alternately place foot on step 0  Standing unsupported, one foot in front 0  Standing on one leg 0  Total:  24/56  GAIT: Distance walked: 400 ft Assistive device utilized: Single point cane Level of assistance: requires ad safety Comments: slow cadence and decreased step length, guarded position  TREATMENT   Reviewed current function, pain levels, response to prior Rx.  TUG: 19.32 sec with rollator  PT educated in correct form and palpation of TrA contraction.  Pt performed TrA contractions with cuing. Supine marching with TrA 2x5  Bridging x 10 and x 5 Supine manual HS stretch 2x30 sec Seated clams with RTB 2x5, GTB x 10  Pt received a HEP handout and was educated in correct form and appropriate frequency.     PATIENT EDUCATION:  Education details: exercise form, rationale of interventions, and HEP. Person educated: Patient Education method: Explanation, demonstration, verbal and tactile cues, handout Education comprehension: verbalized understanding, returned demonstration, verbal and tactile cues required  HOME EXERCISE PROGRAM: AQUATIC Access Code: [email or web address] URL: [email or web address] Date: 01/06/2024 Prepared by: [facility] This aquatic home exercise program from [employer] utilizes pictures from land based exercises, but has been adapted prior to lamination and issuance.    Updated HEP: - Supine Transversus Abdominis Bracing - Hands on Stomach  - 2 x daily - 7 x weekly - 2 sets - 10 reps - Supine March  - 1 x daily - 7 x weekly - 2 sets - 5 reps - Seated Hip Abduction with Resistance  - 1 x daily - 4 x weekly - 2 sets - 10 reps    ASSESSMENT:  CLINICAL IMPRESSION: Pt presents to Rx stating she has had a bad week.  She reports having a "fibro flare" and being in her bed a lot this week.  PT assessed TUG today and her time was worse compared to the initial eval.  She performed the TUG test with her rollator.  PT worked on core strengthening including instructing pt in performing TrA contractions.  Pt performed exercises on table and did not perform any standing exercises today.  PT updated land based HEP with core stabilization and hip strengthening.  She demonstrates good understanding of HEP.  Pt reports increased pain after Rx.  PT to continue to progress exercises per pt tolerance and response.     OBJECTIVE IMPAIRMENTS: Abnormal gait, decreased activity tolerance, decreased balance, decreased endurance, decreased mobility, difficulty walking, decreased strength, improper body mechanics, postural dysfunction, obesity, and pain.   ACTIVITY LIMITATIONS: carrying, lifting, bending, sitting, standing, squatting, sleeping, stairs, transfers, bathing, and locomotion level  PARTICIPATION LIMITATIONS: meal prep, cleaning, laundry, driving, shopping, community activity, and yard work  PERSONAL FACTORS: Time since onset of injury/illness/exacerbation and 3+ comorbidities: see PmHx are also affecting patient's functional outcome.   REHAB POTENTIAL: Good  CLINICAL DECISION MAKING: Unstable/unpredictable  EVALUATION COMPLEXITY: High   GOALS: Goals reviewed with patient? Yes  SHORT TERM GOALS: Target date: 01/15/24  Pt will tolerate full aquatic sessions consistently without increase in pain and with improving function to demonstrate good toleration and  effectiveness of intervention.  Baseline: Goal status: Met - 02/03/24  2.  Pt will tolerate walking to and from setting (500 ft each way) and full aquatic session without excessive fatigue or pain Baseline: can walk to/from; excessive fatigue and pain if bad weather Goal status: Partially met - 02/03/24  3.  Pt will report a decrease in pain while submerged to 5/10 or less with residual relief for 2 hours after session Baseline: TBA Goal status: Partially met (depends on weather) - 02/10/24    LONG TERM GOALS: Target  date: 02/24/24  Pt to improve on ODI to 43% (MCID) to demonstrate statistically significant Improvement in function. Baseline: 28/50+56% Goal status: INITIAL  2.  .Pt will improve strength in all areas listed by up towards 10 LB or > to demonstrate improved overall physical function Baseline: see chart Goal status: INITIAL  3.  Pt will improve on Tug test to <or=  13s to demonstrate improvement in lower extremity function, mobility and decreased fall risk. Baseline:  Goal status: INITIAL  4.  Pt will be indep with final HEP's (land and aquatic as appropriate) for continued management of condition Baseline: none Goal status: INITIAL  5.  Pt will improve on Berg balance test to >/= 42/56 to demonstrate a decrease in fall risk. Baseline: 24/56 Goal status: INITIAL  6.  Pt will report decrease in pain by at least 50% for improved toleration to activity/quality of life and to demonstrate improved management of pain. Baseline: see chart Goal status: INITIAL  PLAN:  PT FREQUENCY: 2x/week  PT DURATION: 8 weeks  PLANNED INTERVENTIONS: 97164- PT Re-evaluation, 97110-Therapeutic exercises, 97530- Therapeutic activity, 97112- Neuromuscular re-education, 97535- Self Care, 21308- Manual therapy, [phone]- Gait training, [phone]- Orthotic Fit/training, [phone]- Aquatic Therapy, [phone]- Electrical stimulation (unattended), [phone]- Ionotophoresis 4mg /ml Dexamethasone , Patient/Family  education, Balance training, Stair training, Taping, Dry Needling, Joint mobilization, DME instructions, Cryotherapy, and Moist heat.  PLAN FOR NEXT SESSION: aquatic- 6 weeks onlyt: ROM/strengthening, stretching, pain management, toleration to activity, instruction on benefits of aquatic exercise. Land HEP without using exercise machines for core and LE.  Trina Fujita III PT, DPT 02/17/24 11:33 PM  [facility] [address] [place], [place], [address] Phone: [phone]   Fax:  [phone]

## 2024-02-21 ENCOUNTER — Ambulatory Visit (HOSPITAL_BASED_OUTPATIENT_CLINIC_OR_DEPARTMENT_OTHER): Admitting: Physical Therapy

## 2024-02-21 ENCOUNTER — Encounter (HOSPITAL_BASED_OUTPATIENT_CLINIC_OR_DEPARTMENT_OTHER): Payer: Self-pay | Admitting: Physical Therapy

## 2024-02-21 DIAGNOSIS — R2681 Unsteadiness on feet: Secondary | ICD-10-CM

## 2024-02-21 DIAGNOSIS — M6281 Muscle weakness (generalized): Secondary | ICD-10-CM

## 2024-02-21 DIAGNOSIS — M5459 Other low back pain: Secondary | ICD-10-CM

## 2024-02-21 NOTE — Therapy (Signed)
 OUTPATIENT PHYSICAL THERAPY THORACOLUMBAR TREATMENT Progress Note Reporting Period 12/26/23 to 02/21/24  See note below for Objective Data and Assessment of Progress/Goals.      Patient Name: Pamela Esparza MRN: [id number] DOB:September 01, 1966, 58 y.o., female Today's Date: 02/21/2024  END OF SESSION:  PT End of Session - 02/21/24 1022     Visit Number 9    Number of Visits 16    Date for PT Re-Evaluation 03/23/24    Authorization Type [employer]    Progress Note Due on Visit 19    PT Start Time 1019    PT Stop Time 1100    PT Time Calculation (min) 41 min    Activity Tolerance Patient tolerated treatment well    Behavior During Therapy WFL for tasks assessed/performed                Past Medical History:  Diagnosis Date   Allergy    Topiramate, Lyrica , aspirin (sensitive to)   Anxiety    Arthritis    Asthma    Cataract    DDD (degenerative disc disease), lumbar    Depression    Depression    Phreesia 10/13/2020   Family history of bladder cancer    Family history of brain cancer 02/15/2023   Fibromyalgia    Hyperlipidemia    Joint pain    Neuromuscular disorder (HCC)    Phreesia 10/13/2020   Neuropathy    Osteoporosis    Sjogren's disease (HCC)    Thyroid  disease    Past Surgical History:  Procedure Laterality Date   ANKLE SURGERY Right    APPENDECTOMY     CHOLECYSTECTOMY     ESOPHAGOGASTRODUODENOSCOPY (EGD) WITH PROPOFOL  N/A 12/05/2020   Procedure: ESOPHAGOGASTRODUODENOSCOPY (EGD) WITH PROPOFOL ;  Surgeon: Irby Mannan, MD;  Location: [facility];  Service: Endoscopy;  Laterality: N/A;   finger sugery     NECK SURGERY     SPINE SURGERY     On my neck   TENDON TRANSFER Right 11/06/2019   Procedure: PERONEAL RECONSTRUCTION WITH PERONEAL LONGUS RIGHT LEG;  Surgeon: Timothy Ford, MD;  Location: [facility];  Service: Orthopedics;  Laterality: Right;   TOTAL ABDOMINAL HYSTERECTOMY     TUBAL LIGATION N/A    Phreesia 10/13/2020    Patient Active Problem List   Diagnosis Date Noted   Polyp of colon 01/27/2023   Hyperlipidemia 04/28/2022   Pituitary microadenoma (HCC) 09/14/2021   Primary osteoarthritis 09/03/2021   Sjogren's disease (HCC) 04/16/2021   Spondylosis of lumbar region without myelopathy or radiculopathy 02/04/2021   Essential (primary) hypertension 02/02/2021   Class 3 severe obesity due to excess calories with serious comorbidity and body mass index (BMI) of 45.0 to 49.9 in adult 02/02/2021   Chronic pain syndrome 01/12/2021   Peroneal tendinitis, right leg    Multiple atypical nevi 08/14/2019   Haglund's deformity of right heel 06/25/2019   Hypothyroidism 03/21/2019   Chronic joint pain 10/23/2018   DDD (degenerative disc disease), lumbar 10/23/2018   Muscle pain, fibromyalgia 10/23/2018   Lumbosacral spondylosis without myelopathy 08/20/2015   Neuropathy 07/11/2013    PCP: Maryclare Smoke PA  REFERRING PROVIDER: Liam Redhead, MD   REFERRING DIAG:  G89.4 (ICD-10-CM) - Chronic pain syndrome  M47.819 (ICD-10-CM) - Facet arthropathy    Rationale for Evaluation and Treatment: Rehabilitation  THERAPY DIAG:  Other low back pain  Muscle weakness (generalized)  Unsteadiness on feet  ONSET DATE: 2013  SUBJECTIVE:  SUBJECTIVE STATEMENT: Pt state took 2 days to reduce pain form land based session. Pain reduces while submerged but returns once out of the water    PERTINENT HISTORY:  Facet arthropathy; insomnia; chronic Pain Syndrome, fibromyalgia, Sjogren's syndrome Pituitary Microadenoma  PAIN:  Are you having pain? Yes: NPRS scale: current 8/10 Pain location: lumbar, pain down entire R LE to toes Pain description: ache, P&N, sharp constant Aggravating factors: movement, walk ~ 30 minutes;  standing <10 minutes Relieving factors: trigger point shots, ablations at least 1 x year,  PRECAUTIONS: Fall  RED FLAGS: None   WEIGHT BEARING RESTRICTIONS: No  FALLS:  Has patient fallen in last 6 months? Yes. Number of falls 2 legs give out.  LIVING ENVIRONMENT: Lives with: lives with their family, lives with their spouse, and lives with their son Lives in: House/apartment Stairs: Yes: External: 9 steps; on right going up Has following equipment at home: Single point cane, Walker - 2 wheeled, and scooter; tub bench; grab bars  OCCUPATION: disability x 10 yrs  PLOF: Requires assistive device for independence  PATIENT GOALS: decrease pain, improve leg strength, improve balance  NEXT MD VISIT: every 3 months  OBJECTIVE:  Note: Objective measures were completed at Evaluation unless otherwise noted.  DIAGNOSTIC FINDINGS:  None relevant in chart  PATIENT SURVEYS:  Modified Oswestry 56%   COGNITION: Overall cognitive status: Within functional limits for tasks assessed     SENSATION: WFL  MUSCLE LENGTH: Hamstrings: tightness bilaterally tested in sitting  POSTURE: rounded shoulders and forward head  PALPATION: TTP lumbar paraspinals and into bilateral hips to trochanter snd sciatic notches.  LUMBAR ROM:   AROM eval  Flexion FT to below patella  Extension full  Right lateral flexion 50% limited  Left lateral flexion 50% limited  Right rotation   Left rotation    (Blank rows = not tested)  LOWER EXTREMITY ROM:     wfl  LOWER EXTREMITY MMT:    MMT Right eval Left eval  Hip flexion 29.4 33.3  Hip extension    Hip abduction 22.8 29.4  Hip adduction    Hip internal rotation    Hip external rotation    Knee flexion    Knee extension 22.9 16.9  Ankle dorsiflexion    Ankle plantarflexion    Ankle inversion    Ankle eversion     (Blank rows = not tested)  LUMBAR SPECIAL TESTS:  Slump test: Positive  FUNCTIONAL TESTS:  Timed up and go (TUG):  15.07 complete with cane BERG Balance Test          Date: 12/26/23            02/21/24 Sit to Stand 2 3  Standing unsupported 3 3  Sitting with back unsupported but feet supported 4 4  Stand to sit  2 3  Transfers  3 3  Standing unsupported with eyes closed 2 2  Standing unsupported feet together 2 2  From standing position, reach forward with outstretched arm 3 3  From standing position, pick up object from floor 1 1  From standing position, turn and look behind over each shoulder 1 2  Turn 360 1 1  Standing unsupported, alternately place foot on step 0 0  Standing unsupported, one foot in front 0 0  Standing on one leg 0 0  Total:  24/56 26/56     GAIT: Distance walked: 400 ft Assistive device utilized: Single point cane Level of assistance: requires ad safety Comments: slow cadence  and decreased step length, guarded position  TREATMENT  [facility] Adult PT Treatment:                                                DATE: 02/21/24   Pt seen for aquatic therapy today.  Treatment took place in water 3.5-4.75 ft in depth at the [facility] pool. Temp of water was 91.  Pt entered/exited the pool via stairs independently with bilat rail.   - unsupported: walking forward/ backward multiple laps  - tandem gait forward/ backward with yellow hand floats  - tandem stance, hands out of water, x 20s each  - side stepping with arm addct/ abdct with RB hand floats  -STS from water bench onto water step 2x10.  - UE on wall:  "L" stretch (pt reports reduces LBP) - wall push up/off x 10 -  bilat calf stretch  - suspended cycling noodle wrapped posteriorly across chest  Pt requires the buoyancy and hydrostatic pressure of water for support, and to offload joints by unweighting joint load by at least 50 % in navel deep water and by at least 75-80% in chest to neck deep water.  Viscosity of the water is needed for resistance of strengthening. Water current perturbations provides challenge to  standing balance requiring increased core activation.   Prior land  Reviewed current function, pain levels, response to prior Rx.  TUG: 19.32 sec with rollator  PT educated in correct form and palpation of TrA contraction.  Pt performed TrA contractions with cuing. Supine marching with TrA 2x5  Bridging x 10 and x 5 Supine manual HS stretch 2x30 sec Seated clams with RTB 2x5, GTB x 10  Pt received a HEP handout and was educated in correct form and appropriate frequency.     PATIENT EDUCATION:  Education details: exercise form, rationale of interventions, and HEP. Person educated: Patient Education method: Explanation, demonstration, verbal and tactile cues, handout Education comprehension: verbalized understanding, returned demonstration, verbal and tactile cues required  HOME EXERCISE PROGRAM: AQUATIC Access Code: [email or web address] URL: [email or web address] Date: 01/06/2024 Prepared by: [facility] [address] This aquatic home exercise program from [employer] utilizes pictures from land based exercises, but has been adapted prior to lamination and issuance.   Updated HEP: - Supine Transversus Abdominis Bracing - Hands on Stomach  - 2 x daily - 7 x weekly - 2 sets - 10 reps - Supine March  - 1 x daily - 7 x weekly - 2 sets - 5 reps - Seated Hip Abduction with Resistance  - 1 x daily - 4 x weekly - 2 sets - 10 reps    ASSESSMENT:  CLINICAL IMPRESSION: PNPt reports she continues in fibro flare.  Rainy weather exacerbates.  Land based session increased pain although pt understands importance of having land based HEP when she is unable to access pool. She has made progress albeit slow.  Berg improved 2 points.  She is tolerating walking to and from setting with some fatigue and pain but not limiting. She has initiated instruction on land based HEP and has gained pool access through [facility].  She is leaving town at end of June for a few weeks.   Plan for her  to attend next/last land based session to review HEP then will return to aquatics re-certed for continued aquatic intervention as she has  not yet met majority of goals.       OBJECTIVE IMPAIRMENTS: Abnormal gait, decreased activity tolerance, decreased balance, decreased endurance, decreased mobility, difficulty walking, decreased strength, improper body mechanics, postural dysfunction, obesity, and pain.   ACTIVITY LIMITATIONS: carrying, lifting, bending, sitting, standing, squatting, sleeping, stairs, transfers, bathing, and locomotion level  PARTICIPATION LIMITATIONS: meal prep, cleaning, laundry, driving, shopping, community activity, and yard work  PERSONAL FACTORS: Time since onset of injury/illness/exacerbation and 3+ comorbidities: see PmHx are also affecting patient's functional outcome.   REHAB POTENTIAL: Good  CLINICAL DECISION MAKING: Unstable/unpredictable  EVALUATION COMPLEXITY: High   GOALS: Goals reviewed with patient? Yes  SHORT TERM GOALS: Target date: 01/15/24  Pt will tolerate full aquatic sessions consistently without increase in pain and with improving function to demonstrate good toleration and effectiveness of intervention.  Baseline: Goal status: Met - 02/03/24  2.  Pt will tolerate walking to and from setting (500 ft each way) and full aquatic session without excessive fatigue or pain Baseline: can walk to/from; excessive fatigue and pain if bad weather Goal status: Partially met - 02/03/24  3.  Pt will report a decrease in pain while submerged to 5/10 or less with residual relief for 2 hours after session Baseline: TBA Goal status: Partially met (depends on weather) - 02/10/24    LONG TERM GOALS: Target date: 03/23/24/25  Pt to improve on ODI to 43% (MCID) to demonstrate statistically significant Improvement in function. Baseline: 28/50+56% Goal status: INITIAL  2.  .Pt will improve strength in all areas listed by up towards 10 LB or > to demonstrate  improved overall physical function Baseline: see chart Goal status: INITIAL  3.  Pt will improve on Tug test to <or=  13s to demonstrate improvement in lower extremity function, mobility and decreased fall risk. Baseline:  Goal status: In Progress 02/21/24  4.  Pt will be indep with final HEP's (land and aquatic as appropriate) for continued management of condition Baseline: none Goal status: INITIAL  5.  Pt will improve on Berg balance test to >/= 42/56 to demonstrate a decrease in fall risk. Baseline: 24/56 Goal status: In progress 02/21/24  6.  Pt will report decrease in pain by at least 50% for improved toleration to activity/quality of life and to demonstrate improved management of pain. Baseline: see chart Goal status: In progress 02/21/24  PLAN:  PT FREQUENCY: 2x/week  PT DURATION: 4 weeks  PLANNED INTERVENTIONS: 97164- PT Re-evaluation, 97110-Therapeutic exercises, 97530- Therapeutic activity, 97112- Neuromuscular re-education, 97535- Self Care, 29562- Manual therapy, [phone]- Gait training, [phone]- Orthotic Fit/training, [phone]- Aquatic Therapy, [phone]- Electrical stimulation (unattended), [phone]- Ionotophoresis 4mg /ml Dexamethasone , Patient/Family education, Balance training, Stair training, Taping, Dry Needling, Joint mobilization, DME instructions, Cryotherapy, and Moist heat.  PLAN FOR NEXT SESSION: aquatic- 6 weeks onlyt: ROM/strengthening, stretching, pain management, toleration to activity, instruction on benefits of aquatic exercise. Land HEP without using exercise machines for core and LE.  Adriana Hopping [place]) Son Barkan MPT 02/21/24 10:47 AM [facility] [address] [place], [place], [address] Phone: [phone]   Fax:  [phone]

## 2024-02-24 ENCOUNTER — Encounter (HOSPITAL_BASED_OUTPATIENT_CLINIC_OR_DEPARTMENT_OTHER): Payer: Self-pay

## 2024-02-24 ENCOUNTER — Ambulatory Visit (HOSPITAL_BASED_OUTPATIENT_CLINIC_OR_DEPARTMENT_OTHER): Admitting: Physical Therapy

## 2024-02-28 ENCOUNTER — Encounter: Attending: Physical Medicine and Rehabilitation | Admitting: Physical Medicine and Rehabilitation

## 2024-02-28 DIAGNOSIS — M5010 Cervical disc disorder with radiculopathy, unspecified cervical region: Secondary | ICD-10-CM | POA: Diagnosis not present

## 2024-02-28 DIAGNOSIS — M5416 Radiculopathy, lumbar region: Secondary | ICD-10-CM | POA: Insufficient documentation

## 2024-02-28 DIAGNOSIS — Z79891 Long term (current) use of opiate analgesic: Secondary | ICD-10-CM | POA: Insufficient documentation

## 2024-02-28 DIAGNOSIS — M542 Cervicalgia: Secondary | ICD-10-CM | POA: Insufficient documentation

## 2024-02-28 DIAGNOSIS — M7061 Trochanteric bursitis, right hip: Secondary | ICD-10-CM | POA: Insufficient documentation

## 2024-02-28 DIAGNOSIS — Z5181 Encounter for therapeutic drug level monitoring: Secondary | ICD-10-CM | POA: Insufficient documentation

## 2024-02-28 DIAGNOSIS — G894 Chronic pain syndrome: Secondary | ICD-10-CM | POA: Insufficient documentation

## 2024-02-28 DIAGNOSIS — G8929 Other chronic pain: Secondary | ICD-10-CM | POA: Insufficient documentation

## 2024-02-28 DIAGNOSIS — M546 Pain in thoracic spine: Secondary | ICD-10-CM | POA: Insufficient documentation

## 2024-02-28 MED ORDER — MELOXICAM 15 MG PO TABS: 15.0000 mg | ORAL_TABLET | Freq: Every day | ORAL | 3 refills | Status: AC | PRN

## 2024-02-28 NOTE — Progress Notes (Signed)
 Subjective:    Patient ID: Pamela Esparza, female    DOB: 1966/09/17, 58 y.o.   MRN: [id number]  HPI Pamela Esparza is a 58 year old woman who presents to establish care for leg pain  1) Leg pain: -radiates from the back -has leg swelling  -she has been diagnosed with sciatica and herniated disc -she gets steroid injections in [facility] and these help with the back pain -this has happened since 2012 -doing PT  2) Insomnia: -she takes flexeril and this helps her sleep  3) Nausea: -started with LDN and stopped after she stopped the medicine  4) Cervical spine pain: -she has pain in both shoulders -she is interested in cervical spine injections -radiates into her arms -she has had prior cervical spine surgery -when she had surgery she was dropping objects -she has tried postural corrections but has muscle spasm  Pain Inventory Average Pain 8 Pain Right Now 8 My pain is constant, sharp, burning, dull, stabbing, tingling, and aching  In the last 24 hours, has pain interfered with the following? General activity 8 Relation with others 7 Enjoyment of life 10 What TIME of day is your pain at its worst? morning , daytime, evening, night, and varies Sleep (in general) Fair  Pain is worse with: walking, bending, sitting, inactivity, standing, and some activites Pain improves with: rest, therapy/exercise, pacing activities, medication, TENS, injections, and heat Relief from Meds: 9  walk with assistance use a cane use a walker ability to climb steps?  yes do you drive?  yes Do you have any goals in this area?  yes  disabled: date disabled 2013? I need assistance with the following:  dressing, bathing, meal prep, household duties, and shopping Do you have any goals in this area?  yes  bladder control problems bowel control problems weakness numbness tremor tingling trouble walking spasms dizziness confusion depression anxiety  Any changes since last  visit?  no  Any changes since last visit?  no    Family History  Problem Relation Age of Onset   Breast cancer Mother 30       second cancer at 54   Lung cancer Mother 53   Arthritis Mother    Cancer Mother    COPD Mother    Heart attack Father    Hypertension Father    Diabetes Father    Bladder Cancer Father 34   Arthritis Father    Asthma Father    Cancer Father    COPD Father    Heart disease Father    Stroke Father    Vision loss Father    Lung cancer Maternal Aunt    Cancer Maternal Aunt    Brain cancer Paternal Uncle    Thyroid  disease Paternal Uncle    Birth defects Maternal Grandmother        ovarian   Depression Maternal Grandmother    Uterine cancer Maternal Grandmother        d. 46s   Cancer Maternal Grandmother    Brain cancer Paternal Grandmother    Cancer Paternal Grandmother    Obesity Paternal Grandmother    ADD / ADHD Son    Cancer Paternal Uncle    Early death Paternal Uncle    Obesity Paternal Uncle    Colon cancer Neg Hx    Colon polyps Neg Hx    Esophageal cancer Neg Hx    Stomach cancer Neg Hx    Rectal cancer Neg Hx    Social  History   Socioeconomic History   Marital status: Married    Spouse name: Not on file   Number of children: Not on file   Years of education: Not on file   Highest education level: Associate degree: occupational, Scientist, product/process development, or vocational program  Occupational History   Not on file  Tobacco Use   Smoking status: Never    Passive exposure: Never   Smokeless tobacco: Never   Tobacco comments:    Never smoked  Vaping Use   Vaping status: Never Used  Substance and Sexual Activity   Alcohol use: Not Currently    Comment: 4 drinks a year-rare   Drug use: Never   Sexual activity: Not Currently    Comment: Total hysterectomy  Other Topics Concern   Not on file  Social History Narrative   Not on file   Social Drivers of Health   Financial Resource Strain: Low Risk  (08/02/2023)   Overall Financial  Resource Strain (CARDIA)    Difficulty of Paying Living Expenses: Not very hard  Food Insecurity: No Food Insecurity (12/16/2023)   Hunger Vital Sign    Worried About Running Out of Food in the Last Year: Never true    Ran Out of Food in the Last Year: Never true  Transportation Needs: No Transportation Needs (08/02/2023)   PRAPARE - Administrator, Civil Service (Medical): No    Lack of Transportation (Non-Medical): No  Physical Activity: Unknown (08/02/2023)   Exercise Vital Sign    Days of Exercise per Week: 0 days    Minutes of Exercise per Session: Not on file  Recent Concern: Physical Activity - Inactive (08/02/2023)   Exercise Vital Sign    Days of Exercise per Week: 0 days    Minutes of Exercise per Session: 0 min  Stress: No Stress Concern Present (08/02/2023)   [employer] of Occupational Health - Occupational Stress Questionnaire    Feeling of Stress : Only a little  Social Connections: Moderately Integrated (08/02/2023)   Social Connection and Isolation Panel [NHANES]    Frequency of Communication with Friends and Family: More than three times a week    Frequency of Social Gatherings with Friends and Family: Never    Attends Religious Services: 1 to 4 times per year    Active Member of [employer] or Organizations: No    Attends Engineer, structural: Not on file    Marital Status: Married   Past Surgical History:  Procedure Laterality Date   ANKLE SURGERY Right    APPENDECTOMY     CHOLECYSTECTOMY     ESOPHAGOGASTRODUODENOSCOPY (EGD) WITH PROPOFOL  N/A 12/05/2020   Procedure: ESOPHAGOGASTRODUODENOSCOPY (EGD) WITH PROPOFOL ;  Surgeon: Irby Mannan, MD;  Location: [facility];  Service: Endoscopy;  Laterality: N/A;   finger sugery     NECK SURGERY     SPINE SURGERY     On my neck   TENDON TRANSFER Right 11/06/2019   Procedure: PERONEAL RECONSTRUCTION WITH PERONEAL LONGUS RIGHT LEG;  Surgeon: Timothy Ford, MD;  Location: [facility];  Service: Orthopedics;  Laterality: Right;   TOTAL ABDOMINAL HYSTERECTOMY     TUBAL LIGATION N/A    [employer]reesia 10/13/2020   Past Medical History:  Diagnosis Date   Allergy    Topiramate, Lyrica , aspirin (sensitive to)   Anxiety    Arthritis    Asthma    Cataract    DDD (degenerative disc disease), lumbar    Depression  Depression    [employer]reesia 10/13/2020   Family history of bladder cancer    Family history of brain cancer 02/15/2023   Fibromyalgia    Hyperlipidemia    Joint pain    Neuromuscular disorder (HCC)    [employer]reesia 10/13/2020   Neuropathy    Osteoporosis    Sjogren's disease (HCC)    Thyroid  disease    There were no vitals taken for this visit.  Opioid Risk Score:   Fall Risk Score:  `1  Depression screen [facility] 2/9     01/27/2024   10:06 AM 12/16/2023    8:53 AM 11/24/2023    9:54 AM 11/09/2023   10:35 AM 08/24/2023    2:53 PM 08/03/2023    2:27 PM 05/24/2023    1:41 PM  Depression screen PHQ 2/9  Decreased Interest 0 0 0 1 0 2 1  Down, Depressed, Hopeless 0 0 0 0 0 0 1  PHQ - 2 Score 0 0 0 1 0 2 2  Altered sleeping   0 1 0 2 2  Tired, decreased energy   0 2 0 3 3  Change in appetite   0 0 0 0 0  Feeling bad or failure about yourself    0 0 0 0 0  Trouble concentrating   0 2 3 2 3   Moving slowly or fidgety/restless   0 0 0 0 0  Suicidal thoughts   0 0 0 0 0  PHQ-9 Score   0 6 3 9 10   Difficult doing work/chores    Somewhat difficult Not difficult at all Very difficult Very difficult    Review of Systems  Gastrointestinal:  Positive for constipation.  Genitourinary:  Positive for frequency.  Musculoskeletal:  Positive for gait problem.       Pain all over the body  Neurological:  Positive for weakness and numbness.  Psychiatric/Behavioral:  Positive for confusion.        Depression, anxiety  All other systems reviewed and are negative.      Objective:   Physical Exam PRIOR EXAM: Gen: no distress, normal appearing HEENT: oral mucosa pink  and moist, NCAT Cardio: Reg rate Chest: normal effort, normal rate of breathing Abd: soft, non-distended Ext: no edema Psych: pleasant, normal affect Skin: intact Neuro: Alert and oriented x3, 4/5 strength upper extremities     Assessment & Plan:   1) Chronic Pain Syndrome secondary to leg pain: -discussed that she had EMG/[place] testing and this was told to her to be normal -discussed that she used to be strong -discussed that she avoids processed foods -Discussed current symptoms of pain and history of pain.  -Discussed benefits of exercise in reducing pain. -Discussed following foods that may reduce pain: 1) Ginger (especially studied for arthritis)- reduce leukotriene production to decrease inflammation 2) Blueberries- high in phytonutrients that decrease inflammation 3) Salmon- marine omega-3s reduce joint swelling and pain 4) Pumpkin seeds- reduce inflammation 5) dark chocolate- reduces inflammation 6) turmeric- reduces inflammation 7) tart cherries - reduce pain and stiffness 8) extra virgin olive oil - its compound olecanthal helps to block prostaglandins  9) chili peppers- can be eaten or applied topically via capsaicin 10) mint- helpful for headache, muscle aches, joint pain, and itching 11) garlic- reduces inflammation   2) Insomnia: -Try to go outside near sunrise -Get exercise during the day.  -Turn off all devices an hour before bedtime.  -Teas that can benefit: chamomile, valerian root, Brahmi (Bacopa) -Can consider over the counter  melatonin, magnesium, and/or L-theanine. Melatonin is an anti-oxidant with multiple health benefits. Magnesium is involved in greater than 300 enzymatic reactions in the body and most of us  are deficient as our soil is often depleted. There are 7 different types of magnesium- Bioptemizer's is a supplement with all 7 types, and each has unique benefits. Magnesium can also help with constipation and anxiety.  -Pistachios naturally increase  the production of melatonin -Cozy Earth bamboo bed sheets are free from toxic chemicals.  -Tart cherry juice or a tart cherry supplement can improve sleep and soreness post-workout    Link to further information on diet for chronic pain: http://www.bray.com/    3) Facet arthropathy: -discussed that she has had medial branch blocks  4) Constipation:  -Provided list of following foods that help with constipation and highlighted a few: 1) prunes- contain high amounts of fiber.  2) apples- has a form of dietary fiber called pectin that accelerates stool movement and increases beneficial gut bacteria 3) pears- in addition to fiber, also high in fructose and sorbitol which have laxative effect 4) figs- contain an enzyme ficin which helps to speed colonic transit 5) kiwis- contain an enzyme actinidin that improves gut motility and reduces constipation 6) oranges- rich in pectin (like apples) 7) grapefruits- contain a flavanol naringenin which has a laxative effect 8) vegetables- rich in fiber and also great sources of folate, vitamin C, and K 9) artichoke- high in inulin, prebiotic great for the microbiome 10) chicory- increases stool frequency and softness (can be added to coffee) 11) rhubarb- laxative effect 12) sweet potato- high fiber 13) beans, peas, and lentils- contain both soluble and insoluble fiber 14) chia seeds- improves intestinal health and gut flora 15) flaxseeds- laxative effect 16) whole grain rye bread- high in fiber 17) oat bran- high in soluble and insoluble fiber 18) kefir- softens stools -recommended to try at least one of these foods every day.  -drink 6-8 glasses of water per day -walk regularly, especially after meals.   5) Shortness of breath: -discussed that she had a thorough eval and her heart is working well  6) Cervical spine pain:  -discussed her cervical radiculopathy, discussed  getting an MRI cervical spine, discussed that she has been receiving 10mg  Percocet from another provider but that they have moved far away and so she would like to schedule with us , discussed that we can probably get her in to see our nurse practitioner Ford Ide next week and that we would have to do pain contract and urine sample at this appointment  -Discussed Qutenza as an option for neuropathic pain control. Discussed that this is a capsaicin patch, stronger than capsaicin cream. Discussed that it is currently approved for diabetic peripheral neuropathy and post-herpetic neuralgia, but that it has also shown benefit in treating other forms of neuropathy. Provided patient with link to site to learn more about the patch: https://www.clark.biz/. Discussed that the patch would be placed in office and benefits usually last 3 months. Discussed that unintended exposure to capsaicin can cause severe irritation of eyes, mucous membranes, respiratory tract, and skin, but that Qutenza is a local treatment and does not have the systemic side effects of other nerve medications. Discussed that there may be pain, itching, erythema, and decreased sensory function associated with the application of Qutenza. Side effects usually subside within 1 week. A cold pack of analgesic medications can help with these side effects. Blood pressure can also be increased due to pain associated with administration of  the patch.   7) Neuropathy: -continue voltaren  -Discussed Qutenza as an option for neuropathic pain control. Discussed that this is a capsaicin patch, stronger than capsaicin cream. Discussed that it is currently approved for diabetic peripheral neuropathy and post-herpetic neuralgia, but that it has also shown benefit in treating other forms of neuropathy. Provided patient with link to site to learn more about the patch: https://www.clark.biz/. Discussed that the patch would be placed in office and benefits  usually last 3 months. Discussed that unintended exposure to capsaicin can cause severe irritation of eyes, mucous membranes, respiratory tract, and skin, but that Qutenza is a local treatment and does not have the systemic side effects of other nerve medications. Discussed that there may be pain, itching, erythema, and decreased sensory function associated with the application of Qutenza. Side effects usually subside within 1 week. A cold pack of analgesic medications can help with these side effects. Blood pressure can also be increased due to pain associated with administration of the patch.   -frankincense and myrrh  8) Lumbar radiculopathy:  -discussed avoiding processed foods  -Discussed Qutenza as an option for neuropathic pain control. Discussed that this is a capsaicin patch, stronger than capsaicin cream. Discussed that it is currently approved for diabetic peripheral neuropathy and post-herpetic neuralgia, but that it has also shown benefit in treating other forms of neuropathy. Provided patient with link to site to learn more about the patch: https://www.clark.biz/. Discussed that the patch would be placed in office and benefits usually last 3 months. Discussed that unintended exposure to capsaicin can cause severe irritation of eyes, mucous membranes, respiratory tract, and skin, but that Qutenza is a local treatment and does not have the systemic side effects of other nerve medications. Discussed that there may be pain, itching, erythema, and decreased sensory function associated with the application of Qutenza. Side effects usually subside within 1 week. A cold pack of analgesic medications can help with these side effects. Blood pressure can also be increased due to pain associated with administration of the patch.   10 minutes spent in discussion of her cervical radiculopathy, discussed getting an MRI cervical spine, discussed that she has been receiving 10mg  Percocet from another  provider but that they have moved far away and so she would like to schedule with us , discussed that we can probably get her in to see our nurse practitioner Ford Ide next week and that we would have to do pain contract and urine sample at this appointment

## 2024-02-29 ENCOUNTER — Ambulatory Visit: Payer: Self-pay | Admitting: Family Medicine

## 2024-02-29 LAB — TSH RFX ON ABNORMAL TO FREE T4: TSH: 2.87 u[IU]/mL (ref 0.450–4.500)

## 2024-03-06 ENCOUNTER — Encounter: Payer: Self-pay | Admitting: Registered Nurse

## 2024-03-06 ENCOUNTER — Encounter: Admitting: Registered Nurse

## 2024-03-06 VITALS — BP 146/95 | HR 63 | Ht 65.0 in | Wt 270.8 lb

## 2024-03-06 DIAGNOSIS — Z5181 Encounter for therapeutic drug level monitoring: Secondary | ICD-10-CM

## 2024-03-06 DIAGNOSIS — M5416 Radiculopathy, lumbar region: Secondary | ICD-10-CM | POA: Diagnosis not present

## 2024-03-06 DIAGNOSIS — M546 Pain in thoracic spine: Secondary | ICD-10-CM | POA: Diagnosis present

## 2024-03-06 DIAGNOSIS — M542 Cervicalgia: Secondary | ICD-10-CM

## 2024-03-06 DIAGNOSIS — G894 Chronic pain syndrome: Secondary | ICD-10-CM | POA: Diagnosis present

## 2024-03-06 DIAGNOSIS — Z79891 Long term (current) use of opiate analgesic: Secondary | ICD-10-CM

## 2024-03-06 DIAGNOSIS — M7061 Trochanteric bursitis, right hip: Secondary | ICD-10-CM

## 2024-03-06 DIAGNOSIS — G8929 Other chronic pain: Secondary | ICD-10-CM

## 2024-03-06 DIAGNOSIS — M5010 Cervical disc disorder with radiculopathy, unspecified cervical region: Secondary | ICD-10-CM | POA: Diagnosis present

## 2024-03-06 NOTE — Progress Notes (Signed)
 Subjective:    Patient ID: Pamela Esparza, female    DOB: 1965/10/07, 57 y.o.   MRN: [id number]  HPI: Pamela Esparza is a 58 y.o. female who returns for follow up appointment for chronic pain and medication refill. She states her pain is located in her  neck,  mid- lower back radiating into her right buttock and right lower extremity. Also reports right hip pain. She rates her pain 6. Her current exercise regime is walking and performing stretching exercises.  Pamela Esparza Morphine equivalent is 30.00 MME.   UDS ordered today.   Pain Inventory Average Pain 4 Pain Right Now 6 My pain is constant, sharp, burning, dull, stabbing, tingling, aching, and numbness and weakness  In the last 24 hours, has pain interfered with the following? General activity 7 Relation with others 8 Enjoyment of life 7 What TIME of day is your pain at its worst? morning , daytime, evening, and night Sleep (in general) Poor  Pain is worse with: walking, bending, sitting, inactivity, standing, and some activites Pain improves with: rest, heat/ice, therapy/exercise, pacing activities, medication, TENS, and injections Relief from Meds: 8  Family History  Problem Relation Age of Onset   Breast cancer Mother 29       second cancer at 55   Lung cancer Mother 98   Arthritis Mother    Cancer Mother    COPD Mother    Heart attack Father    Hypertension Father    Diabetes Father    Bladder Cancer Father 47   Arthritis Father    Asthma Father    Cancer Father    COPD Father    Heart disease Father    Stroke Father    Vision loss Father    Lung cancer Maternal Aunt    Cancer Maternal Aunt    Brain cancer Paternal Uncle    Thyroid  disease Paternal Uncle    Birth defects Maternal Grandmother        ovarian   Depression Maternal Grandmother    Uterine cancer Maternal Grandmother        d. 36s   Cancer Maternal Grandmother    Brain cancer Paternal Grandmother    Cancer Paternal Grandmother    Obesity  Paternal Grandmother    ADD / ADHD Son    Cancer Paternal Uncle    Early death Paternal Uncle    Obesity Paternal Uncle    Colon cancer Neg Hx    Colon polyps Neg Hx    Esophageal cancer Neg Hx    Stomach cancer Neg Hx    Rectal cancer Neg Hx    Social History   Socioeconomic History   Marital status: Married    Spouse name: Not on file   Number of children: Not on file   Years of education: Not on file   Highest education level: Associate degree: occupational, Scientist, product/process development, or vocational program  Occupational History   Not on file  Tobacco Use   Smoking status: Never    Passive exposure: Never   Smokeless tobacco: Never   Tobacco comments:    Never smoked  Vaping Use   Vaping status: Never Used  Substance and Sexual Activity   Alcohol use: Not Currently    Comment: 4 drinks a year-rare   Drug use: Never   Sexual activity: Not Currently    Comment: Total hysterectomy  Other Topics Concern   Not on file  Social History Narrative   Not on file  Social Drivers of Corporate investment banker Strain: Low Risk  (08/02/2023)   Overall Financial Resource Strain (CARDIA)    Difficulty of Paying Living Expenses: Not very hard  Food Insecurity: No Food Insecurity (12/16/2023)   Hunger Vital Sign    Worried About Running Out of Food in the Last Year: Never true    Ran Out of Food in the Last Year: Never true  Transportation Needs: No Transportation Needs (08/02/2023)   PRAPARE - Administrator, Civil Service (Medical): No    Lack of Transportation (Non-Medical): No  Physical Activity: Unknown (08/02/2023)   Exercise Vital Sign    Days of Exercise per Week: 0 days    Minutes of Exercise per Session: Not on file  Recent Concern: Physical Activity - Inactive (08/02/2023)   Exercise Vital Sign    Days of Exercise per Week: 0 days    Minutes of Exercise per Session: 0 min  Stress: No Stress Concern Present (08/02/2023)   [employer] of Occupational Health -  Occupational Stress Questionnaire    Feeling of Stress : Only a little  Social Connections: Moderately Integrated (08/02/2023)   Social Connection and Isolation Panel [NHANES]    Frequency of Communication with Friends and Family: More than three times a week    Frequency of Social Gatherings with Friends and Family: Never    Attends Religious Services: 1 to 4 times per year    Active Member of [employer] or Organizations: No    Attends Engineer, structural: Not on file    Marital Status: Married   Past Surgical History:  Procedure Laterality Date   ANKLE SURGERY Right    APPENDECTOMY     CHOLECYSTECTOMY     ESOPHAGOGASTRODUODENOSCOPY (EGD) WITH PROPOFOL  N/A 12/05/2020   Procedure: ESOPHAGOGASTRODUODENOSCOPY (EGD) WITH PROPOFOL ;  Surgeon: Janalyn Keene NOVAK, MD;  Location: [facility];  Service: Endoscopy;  Laterality: N/A;   finger sugery     NECK SURGERY     SPINE SURGERY     On my neck   TENDON TRANSFER Right 11/06/2019   Procedure: PERONEAL RECONSTRUCTION WITH PERONEAL LONGUS RIGHT LEG;  Surgeon: Harden Jerona GAILS, MD;  Location: [facility];  Service: Orthopedics;  Laterality: Right;   TOTAL ABDOMINAL HYSTERECTOMY     TUBAL LIGATION N/A    [employer]reesia 10/13/2020   Past Surgical History:  Procedure Laterality Date   ANKLE SURGERY Right    APPENDECTOMY     CHOLECYSTECTOMY     ESOPHAGOGASTRODUODENOSCOPY (EGD) WITH PROPOFOL  N/A 12/05/2020   Procedure: ESOPHAGOGASTRODUODENOSCOPY (EGD) WITH PROPOFOL ;  Surgeon: Janalyn Keene NOVAK, MD;  Location: [facility];  Service: Endoscopy;  Laterality: N/A;   finger sugery     NECK SURGERY     SPINE SURGERY     On my neck   TENDON TRANSFER Right 11/06/2019   Procedure: PERONEAL RECONSTRUCTION WITH PERONEAL LONGUS RIGHT LEG;  Surgeon: Harden Jerona GAILS, MD;  Location: [facility];  Service: Orthopedics;  Laterality: Right;   TOTAL ABDOMINAL HYSTERECTOMY     TUBAL LIGATION N/A    [employer]reesia 10/13/2020    Past Medical History:  Diagnosis Date   Allergy    Topiramate, Lyrica , aspirin (sensitive to)   Anxiety    Arthritis    Asthma    Cataract    DDD (degenerative disc disease), lumbar    Depression    Depression    [employer]reesia 10/13/2020   Family history of bladder cancer    Family  history of brain cancer 02/15/2023   Fibromyalgia    Hyperlipidemia    Joint pain    Neuromuscular disorder (HCC)    Phreesia 10/13/2020   Neuropathy    Osteoporosis    Sjogren's disease (HCC)    Thyroid  disease    BP (!) 168/87 (BP Location: Left Arm, Patient Position: Sitting)   Pulse 63   Ht 5' 5 (1.651 m)   Wt 270 lb 12.8 oz (122.8 kg)   SpO2 98%   BMI 45.06 kg/m   Opioid Risk Score:   Fall Risk Score:  `1  Depression screen PHQ 2/9     03/06/2024    1:12 PM 01/27/2024   10:06 AM 12/16/2023    8:53 AM 11/24/2023    9:54 AM 11/09/2023   10:35 AM 08/24/2023    2:53 PM 08/03/2023    2:27 PM  Depression screen PHQ 2/9  Decreased Interest 0 0 0 0 1 0 2  Down, Depressed, Hopeless 0 0 0 0 0 0 0  PHQ - 2 Score 0 0 0 0 1 0 2  Altered sleeping 0   0 1 0 2  Tired, decreased energy 0   0 2 0 3  Change in appetite 0   0 0 0 0  Feeling bad or failure about yourself  0   0 0 0 0  Trouble concentrating 0   0 2 3 2   Moving slowly or fidgety/restless 0   0 0 0 0  Suicidal thoughts 0   0 0 0 0  PHQ-9 Score 0   0 6 3 9   Difficult doing work/chores     Somewhat difficult Not difficult at all Very difficult    Review of Systems  All other systems reviewed and are negative.      Objective:   Physical Exam Vitals and nursing note reviewed.  Constitutional:      Appearance: Normal appearance.   Cardiovascular:     Rate and Rhythm: Normal rate and regular rhythm.     Pulses: Normal pulses.     Heart sounds: Normal heart sounds.  Pulmonary:     Effort: Pulmonary effort is normal.     Breath sounds: Normal breath sounds.   Musculoskeletal:     Comments: Normal Muscle Bulk and Muscle Testing  Reveals:  Upper Extremities: Full ROM and Muscle Strength  5/5 Right AC Joint Tenderness Thoracic Paraspinal Tenderness: T-7-T-10  Lumbar Paraspinal Tenderness: L-4-L-5 Bilateral Greater Trochanter Tenderness Lower Extremities: Full ROM and Muscle Strength 5/5 Arises from Table slowly Narrow Based  Gait      Skin:    General: Skin is warm and dry.   Neurological:     Mental Status: She is alert and oriented to person, place, and time.   Psychiatric:        Mood and Affect: Mood normal.        Behavior: Behavior normal.         Assessment & Plan:  Cervicalgia/ Cervical Radiculitis: Continue HEP as Tolerated. Continue Gabapentin . Continue to Monitor.  Chronic Bilateral Thoracic Back Pain: Continue HEP as Tolerated. Continue to Monitor.  Right Lumbar Radiculitis: Continue Gabapentin . Continue to Monitor.  Right Greater Trochanter Bursitis: Continue HEP as Tolerated. Continue to Monitor.  Chronic Pain Syndrome: RX: UDS ordered today, if consistent. Will Resume Oxycodone . Continue to Monitor.   F/U 1 month

## 2024-03-06 NOTE — Patient Instructions (Addendum)
 Dr Rexanne Catalina: [facility]: Referral was sent on 03/01/2024   [address], [place], [place] [address] Phone: [phone]

## 2024-03-07 ENCOUNTER — Ambulatory Visit (HOSPITAL_COMMUNITY)
Admission: RE | Admit: 2024-03-07 | Discharge: 2024-03-07 | Disposition: A | Discharge status: Home / Self Care | Source: Ambulatory Visit | Attending: Physical Medicine and Rehabilitation | Admitting: Physical Medicine and Rehabilitation

## 2024-03-07 DIAGNOSIS — M5010 Cervical disc disorder with radiculopathy, unspecified cervical region: Secondary | ICD-10-CM | POA: Diagnosis not present

## 2024-03-08 ENCOUNTER — Other Ambulatory Visit (HOSPITAL_COMMUNITY): Payer: Self-pay | Admitting: Neurology

## 2024-03-08 DIAGNOSIS — R1310 Dysphagia, unspecified: Secondary | ICD-10-CM

## 2024-03-08 DIAGNOSIS — R1313 Dysphagia, pharyngeal phase: Secondary | ICD-10-CM

## 2024-03-16 ENCOUNTER — Telehealth: Payer: Self-pay

## 2024-03-16 MED ORDER — OXYCODONE-ACETAMINOPHEN 10-325 MG PO TABS: 1.0000 | ORAL_TABLET | Freq: Two times a day (BID) | ORAL | 0 refills | Status: DC | PRN

## 2024-03-16 NOTE — Telephone Encounter (Signed)
 PMP was Reviewed  Labs reviewed.  Oxycodone  e-scribed  Call placed to Ms. Jasso regarding the above, she verbalizes understanding.

## 2024-03-16 NOTE — Telephone Encounter (Signed)
 Pamela Esparza is calling for her lab results and script for pain medication. Call back phone [phone]. Please send  Rx to [facility] on [address] in [place].

## 2024-03-19 ENCOUNTER — Ambulatory Visit (HOSPITAL_COMMUNITY)
Admission: RE | Admit: 2024-03-19 | Discharge: 2024-03-19 | Disposition: A | Discharge status: Home / Self Care | Source: Ambulatory Visit | Attending: Neurology | Admitting: Neurology

## 2024-03-19 DIAGNOSIS — R1313 Dysphagia, pharyngeal phase: Secondary | ICD-10-CM | POA: Insufficient documentation

## 2024-03-19 DIAGNOSIS — R1310 Dysphagia, unspecified: Secondary | ICD-10-CM | POA: Insufficient documentation

## 2024-03-29 ENCOUNTER — Encounter: Attending: Physical Medicine and Rehabilitation | Admitting: Physical Medicine and Rehabilitation

## 2024-03-29 ENCOUNTER — Telehealth: Payer: Self-pay | Admitting: Physical Medicine and Rehabilitation

## 2024-03-29 DIAGNOSIS — M5010 Cervical disc disorder with radiculopathy, unspecified cervical region: Secondary | ICD-10-CM | POA: Insufficient documentation

## 2024-03-29 DIAGNOSIS — G4700 Insomnia, unspecified: Secondary | ICD-10-CM | POA: Insufficient documentation

## 2024-03-29 DIAGNOSIS — M5416 Radiculopathy, lumbar region: Secondary | ICD-10-CM | POA: Insufficient documentation

## 2024-03-29 NOTE — Progress Notes (Signed)
 Subjective:    Patient ID: Pamela Esparza, female    DOB: [birth year], 58 y.o.   MRN: [id number]  HPI An audio/video tele-health visit is felt to be the most appropriate encounter for this patient at this time. This is a follow up tele-visit via phone. The patient is at home. MD is at office. Prior to scheduling this appointment, our staff discussed the limitations of evaluation and management by telemedicine and the availability of in-person appointments. The patient expressed understanding and agreed to proceed.   Pamela Esparza is a 58 year old woman who presents to establish care for leg pain  1) Leg pain: -radiates from the back -has leg swelling  -she has been diagnosed with sciatica and herniated disc -she gets steroid injections in [facility] and these help with the back pain -this has happened since 2012 -doing PT  2) Insomnia: -she takes flexeril and this helps her sleep  3) Nausea: -started with LDN and stopped after she stopped the medicine  4) Cervical spine pain: -she has pain in both shoulders -she is interested in cervical spine injections -radiates into her arms -she has had prior cervical spine surgery -when she had surgery she was dropping objects -she has tried postural corrections but has muscle spasm She is interested in cervical ESI  5) Low back pain: -she used to get ablations that were helpful for her  Pain Inventory Average Pain 8 Pain Right Now 8 My pain is constant, sharp, burning, dull, stabbing, tingling, and aching  In the last 24 hours, has pain interfered with the following? General activity 8 Relation with others 7 Enjoyment of life 10 What TIME of day is your pain at its worst? morning , daytime, evening, night, and varies Sleep (in general) Fair  Pain is worse with: walking, bending, sitting, inactivity, standing, and some activites Pain improves with: rest, therapy/exercise, pacing activities, medication, TENS, injections, and  heat Relief from Meds: 9  walk with assistance use a cane use a walker ability to climb steps?  yes do you drive?  yes Do you have any goals in this area?  yes  disabled: date disabled 2013? I need assistance with the following:  dressing, bathing, meal prep, household duties, and shopping Do you have any goals in this area?  yes  bladder control problems bowel control problems weakness numbness tremor tingling trouble walking spasms dizziness confusion depression anxiety  Any changes since last visit?  no  Any changes since last visit?  no    Family History  Problem Relation Age of Onset   Breast cancer Mother 29       second cancer at 36   Lung cancer Mother 7   Arthritis Mother    Cancer Mother    COPD Mother    Heart attack Father    Hypertension Father    Diabetes Father    Bladder Cancer Father 32   Arthritis Father    Asthma Father    Cancer Father    COPD Father    Heart disease Father    Stroke Father    Vision loss Father    Lung cancer Maternal Aunt    Cancer Maternal Aunt    Brain cancer Paternal Uncle    Thyroid  disease Paternal Uncle    Birth defects Maternal Grandmother        ovarian   Depression Maternal Grandmother    Uterine cancer Maternal Grandmother        d. 3s  Cancer Maternal Grandmother    Brain cancer Paternal Grandmother    Cancer Paternal Grandmother    Obesity Paternal Grandmother    ADD / ADHD Son    Cancer Paternal Uncle    Early death Paternal Uncle    Obesity Paternal Uncle    Colon cancer Neg Hx    Colon polyps Neg Hx    Esophageal cancer Neg Hx    Stomach cancer Neg Hx    Rectal cancer Neg Hx    Social History   Socioeconomic History   Marital status: Married    Spouse name: Not on file   Number of children: Not on file   Years of education: Not on file   Highest education level: Associate degree: occupational, Scientist, product/process development, or vocational program  Occupational History   Not on file  Tobacco Use    Smoking status: Never    Passive exposure: Never   Smokeless tobacco: Never   Tobacco comments:    Never smoked  Vaping Use   Vaping status: Never Used  Substance and Sexual Activity   Alcohol use: Not Currently    Comment: 4 drinks a year-rare   Drug use: Never   Sexual activity: Not Currently    Comment: Total hysterectomy  Other Topics Concern   Not on file  Social History Narrative   Not on file   Social Drivers of Health   Financial Resource Strain: Low Risk  (08/02/2023)   Overall Financial Resource Strain (CARDIA)    Difficulty of Paying Living Expenses: Not very hard  Food Insecurity: No Food Insecurity (12/16/2023)   Hunger Vital Sign    Worried About Running Out of Food in the Last Year: Never true    Ran Out of Food in the Last Year: Never true  Transportation Needs: No Transportation Needs (08/02/2023)   PRAPARE - Administrator, Civil Service (Medical): No    Lack of Transportation (Non-Medical): No  Physical Activity: Unknown (08/02/2023)   Exercise Vital Sign    Days of Exercise per Week: 0 days    Minutes of Exercise per Session: Not on file  Recent Concern: Physical Activity - Inactive (08/02/2023)   Exercise Vital Sign    Days of Exercise per Week: 0 days    Minutes of Exercise per Session: 0 min  Stress: No Stress Concern Present (08/02/2023)   [employer] of Occupational Health - Occupational Stress Questionnaire    Feeling of Stress : Only a little  Social Connections: Moderately Integrated (08/02/2023)   Social Connection and Isolation Panel    Frequency of Communication with Friends and Family: More than three times a week    Frequency of Social Gatherings with Friends and Family: Never    Attends Religious Services: 1 to 4 times per year    Active Member of [employer] or Organizations: No    Attends Engineer, structural: Not on file    Marital Status: Married   Past Surgical History:  Procedure Laterality Date   ANKLE SURGERY  Right    APPENDECTOMY     CHOLECYSTECTOMY     ESOPHAGOGASTRODUODENOSCOPY (EGD) WITH PROPOFOL  N/A 12/05/2020   Procedure: ESOPHAGOGASTRODUODENOSCOPY (EGD) WITH PROPOFOL ;  Surgeon: Janalyn Keene NOVAK, MD;  Location: [facility];  Service: Endoscopy;  Laterality: N/A;   finger sugery     NECK SURGERY     SPINE SURGERY     On my neck   TENDON TRANSFER Right 11/06/2019   Procedure: PERONEAL RECONSTRUCTION WITH PERONEAL LONGUS  RIGHT LEG;  Surgeon: Harden Jerona GAILS, MD;  Location: [facility];  Service: Orthopedics;  Laterality: Right;   TOTAL ABDOMINAL HYSTERECTOMY     TUBAL LIGATION N/A    [employer]reesia 10/13/2020   Past Medical History:  Diagnosis Date   Allergy    Topiramate, Lyrica , aspirin (sensitive to)   Anxiety    Arthritis    Asthma    Cataract    DDD (degenerative disc disease), lumbar    Depression    Depression    [employer]reesia 10/13/2020   Family history of bladder cancer    Family history of brain cancer 02/15/2023   Fibromyalgia    Hyperlipidemia    Joint pain    Neuromuscular disorder (HCC)    Phreesia 10/13/2020   Neuropathy    Osteoporosis    Sjogren's disease (HCC)    Thyroid  disease    There were no vitals taken for this visit.  Opioid Risk Score:   Fall Risk Score:  `1  Depression screen PHQ 2/9     03/06/2024    1:12 PM 01/27/2024   10:06 AM 12/16/2023    8:53 AM 11/24/2023    9:54 AM 11/09/2023   10:35 AM 08/24/2023    2:53 PM 08/03/2023    2:27 PM  Depression screen PHQ 2/9  Decreased Interest 0 0 0 0 1 0 2  Down, Depressed, Hopeless 0 0 0 0 0 0 0  PHQ - 2 Score 0 0 0 0 1 0 2  Altered sleeping 0   0 1 0 2  Tired, decreased energy 0   0 2 0 3  Change in appetite 0   0 0 0 0  Feeling bad or failure about yourself  0   0 0 0 0  Trouble concentrating 0   0 2 3 2   Moving slowly or fidgety/restless 0   0 0 0 0  Suicidal thoughts 0   0 0 0 0  PHQ-9 Score 0   0 6 3 9   Difficult doing work/chores     Somewhat difficult Not difficult at all  Very difficult    Review of Systems  Gastrointestinal:  Positive for constipation.  Genitourinary:  Positive for frequency.  Musculoskeletal:  Positive for gait problem.       Pain all over the body  Neurological:  Positive for weakness and numbness.  Psychiatric/Behavioral:  Positive for confusion.        Depression, anxiety  All other systems reviewed and are negative.      Objective:   Physical Exam PRIOR EXAM: Gen: no distress, normal appearing HEENT: oral mucosa pink and moist, NCAT Cardio: Reg rate Chest: normal effort, normal rate of breathing Abd: soft, non-distended Ext: no edema Psych: pleasant, normal affect Skin: intact Neuro: Alert and oriented x3, 4/5 strength upper extremities     Assessment & Plan:   1) Chronic Pain Syndrome secondary to leg pain: -discussed that she had EMG/[place] testing and this was told to her to be normal -discussed that she used to be strong -discussed that she avoids processed foods -Discussed current symptoms of pain and history of pain.  -Discussed benefits of exercise in reducing pain. -Discussed following foods that may reduce pain: 1) Ginger (especially studied for arthritis)- reduce leukotriene production to decrease inflammation 2) Blueberries- high in phytonutrients that decrease inflammation 3) Salmon- marine omega-3s reduce joint swelling and pain 4) Pumpkin seeds- reduce inflammation 5) dark chocolate- reduces inflammation 6) turmeric- reduces inflammation 7) tart  cherries - reduce pain and stiffness 8) extra virgin olive oil - its compound olecanthal helps to block prostaglandins  9) chili peppers- can be eaten or applied topically via capsaicin 10) mint- helpful for headache, muscle aches, joint pain, and itching 11) garlic- reduces inflammation   2) Insomnia: -Try to go outside near sunrise -Get exercise during the day.  -Turn off all devices an hour before bedtime.  -Teas that can benefit: chamomile, valerian  root, Brahmi (Bacopa) -Can consider over the counter melatonin, magnesium, and/or L-theanine. Melatonin is an anti-oxidant with multiple health benefits. Magnesium is involved in greater than 300 enzymatic reactions in the body and most of us  are deficient as our soil is often depleted. There are 7 different types of magnesium- Bioptemizer's is a supplement with all 7 types, and each has unique benefits. Magnesium can also help with constipation and anxiety.  -Pistachios naturally increase the production of melatonin -Cozy Earth bamboo bed sheets are free from toxic chemicals.  -Tart cherry juice or a tart cherry supplement can improve sleep and soreness post-workout    Link to further information on diet for chronic pain: http://www.bray.com/    3) Facet arthropathy: -discussed that she has had medial branch blocks -referred to Dr. Carilyn for medial branch blocks  4) Constipation:  -Provided list of following foods that help with constipation and highlighted a few: 1) prunes- contain high amounts of fiber.  2) apples- has a form of dietary fiber called pectin that accelerates stool movement and increases beneficial gut bacteria 3) pears- in addition to fiber, also high in fructose and sorbitol which have laxative effect 4) figs- contain an enzyme ficin which helps to speed colonic transit 5) kiwis- contain an enzyme actinidin that improves gut motility and reduces constipation 6) oranges- rich in pectin (like apples) 7) grapefruits- contain a flavanol naringenin which has a laxative effect 8) vegetables- rich in fiber and also great sources of folate, vitamin C, and K 9) artichoke- high in inulin, prebiotic great for the microbiome 10) chicory- increases stool frequency and softness (can be added to coffee) 11) rhubarb- laxative effect 12) sweet potato- high fiber 13) beans, peas, and lentils- contain both soluble  and insoluble fiber 14) chia seeds- improves intestinal health and gut flora 15) flaxseeds- laxative effect 16) whole grain rye bread- high in fiber 17) oat bran- high in soluble and insoluble fiber 18) kefir- softens stools -recommended to try at least one of these foods every day.  -drink 6-8 glasses of water per day -walk regularly, especially after meals.   5) Shortness of breath: -discussed that she had a thorough eval and her heart is working well  6) Cervical spine pain:  -discussed her cervical radiculopathy, discussed getting an MRI cervical spine, discussed that she has been receiving 10mg  Percocet from another provider but that they have moved far away and so she would like to schedule with us , discussed that we can probably get her in to see our nurse practitioner Fidela Ned next week and that we would have to do pain contract and urine sample at this appointment  -referred to Dr. Charlie Dolores for cervical ESI  -Discussed Qutenza as an option for neuropathic pain control. Discussed that this is a capsaicin patch, stronger than capsaicin cream. Discussed that it is currently approved for diabetic peripheral neuropathy and post-herpetic neuralgia, but that it has also shown benefit in treating other forms of neuropathy. Provided patient with link to site to learn more about the patch: https://www.clark.biz/.  Discussed that the patch would be placed in office and benefits usually last 3 months. Discussed that unintended exposure to capsaicin can cause severe irritation of eyes, mucous membranes, respiratory tract, and skin, but that Qutenza is a local treatment and does not have the systemic side effects of other nerve medications. Discussed that there may be pain, itching, erythema, and decreased sensory function associated with the application of Qutenza. Side effects usually subside within 1 week. A cold pack of analgesic medications can help with these side effects. Blood  pressure can also be increased due to pain associated with administration of the patch.   7) Neuropathy: -continue voltaren  -Discussed Qutenza as an option for neuropathic pain control. Discussed that this is a capsaicin patch, stronger than capsaicin cream. Discussed that it is currently approved for diabetic peripheral neuropathy and post-herpetic neuralgia, but that it has also shown benefit in treating other forms of neuropathy. Provided patient with link to site to learn more about the patch: https://www.clark.biz/. Discussed that the patch would be placed in office and benefits usually last 3 months. Discussed that unintended exposure to capsaicin can cause severe irritation of eyes, mucous membranes, respiratory tract, and skin, but that Qutenza is a local treatment and does not have the systemic side effects of other nerve medications. Discussed that there may be pain, itching, erythema, and decreased sensory function associated with the application of Qutenza. Side effects usually subside within 1 week. A cold pack of analgesic medications can help with these side effects. Blood pressure can also be increased due to pain associated with administration of the patch.   -frankincense and myrrh  8) Lumbar radiculopathy:  -discussed avoiding processed foods  -Discussed Qutenza as an option for neuropathic pain control. Discussed that this is a capsaicin patch, stronger than capsaicin cream. Discussed that it is currently approved for diabetic peripheral neuropathy and post-herpetic neuralgia, but that it has also shown benefit in treating other forms of neuropathy. Provided patient with link to site to learn more about the patch: https://www.clark.biz/. Discussed that the patch would be placed in office and benefits usually last 3 months. Discussed that unintended exposure to capsaicin can cause severe irritation of eyes, mucous membranes, respiratory tract, and skin, but that Qutenza is a  local treatment and does not have the systemic side effects of other nerve medications. Discussed that there may be pain, itching, erythema, and decreased sensory function associated with the application of Qutenza. Side effects usually subside within 1 week. A cold pack of analgesic medications can help with these side effects. Blood pressure can also be increased due to pain associated with administration of the patch.    6 minutes spent in discussion of her cervical MRI results, discussed potential benefits of ESI and she would like to try this, referred to Dr. Charlie Dolores

## 2024-03-29 NOTE — Telephone Encounter (Signed)
 Pt scheduled with Dr. Carilyn for  medial brach block facet arthropathy. - it will be patient 1st time getting one here and will need a pre procedure

## 2024-04-16 ENCOUNTER — Other Ambulatory Visit: Payer: Self-pay

## 2024-04-16 ENCOUNTER — Other Ambulatory Visit

## 2024-04-16 DIAGNOSIS — Z1321 Encounter for screening for nutritional disorder: Secondary | ICD-10-CM

## 2024-04-16 DIAGNOSIS — Z6841 Body Mass Index (BMI) 40.0 and over, adult: Secondary | ICD-10-CM

## 2024-04-16 DIAGNOSIS — E7800 Pure hypercholesterolemia, unspecified: Secondary | ICD-10-CM

## 2024-04-16 DIAGNOSIS — I10 Essential (primary) hypertension: Secondary | ICD-10-CM

## 2024-04-17 ENCOUNTER — Ambulatory Visit: Payer: Self-pay

## 2024-04-17 ENCOUNTER — Encounter (HOSPITAL_BASED_OUTPATIENT_CLINIC_OR_DEPARTMENT_OTHER): Admitting: Physical Medicine and Rehabilitation

## 2024-04-17 VITALS — BP 140/79 | HR 68 | Ht 65.0 in | Wt 271.0 lb

## 2024-04-17 DIAGNOSIS — G4700 Insomnia, unspecified: Secondary | ICD-10-CM

## 2024-04-17 DIAGNOSIS — M5010 Cervical disc disorder with radiculopathy, unspecified cervical region: Secondary | ICD-10-CM | POA: Diagnosis present

## 2024-04-17 DIAGNOSIS — M5416 Radiculopathy, lumbar region: Secondary | ICD-10-CM | POA: Diagnosis present

## 2024-04-17 LAB — CBC WITH DIFFERENTIAL/PLATELET
Basophils Absolute: 0.1 x10E3/uL (ref 0.0–0.2)
Basos: 1 %
EOS (ABSOLUTE): 0 x10E3/uL (ref 0.0–0.4)
Eos: 1 %
Hematocrit: 40.2 % (ref 34.0–46.6)
Hemoglobin: 12.8 g/dL (ref 11.1–15.9)
Immature Grans (Abs): 0 x10E3/uL (ref 0.0–0.1)
Immature Granulocytes: 0 %
Lymphocytes Absolute: 2.1 x10E3/uL (ref 0.7–3.1)
Lymphs: 40 %
MCH: 30.2 pg (ref 26.6–33.0)
MCHC: 31.8 g/dL (ref 31.5–35.7)
MCV: 95 fL (ref 79–97)
Monocytes Absolute: 0.4 x10E3/uL (ref 0.1–0.9)
Monocytes: 8 %
Neutrophils Absolute: 2.6 x10E3/uL (ref 1.4–7.0)
Neutrophils: 49 %
Platelets: 247 x10E3/uL (ref 150–450)
RBC: 4.24 x10E6/uL (ref 3.77–5.28)
RDW: 13 % (ref 11.7–15.4)
WBC: 5.2 x10E3/uL (ref 3.4–10.8)

## 2024-04-17 LAB — COMPREHENSIVE METABOLIC PANEL WITH GFR
ALT: 20 IU/L (ref 0–32)
AST: 25 IU/L (ref 0–40)
Albumin: 4 g/dL (ref 3.8–4.9)
Alkaline Phosphatase: 81 IU/L (ref 44–121)
BUN/Creatinine Ratio: 18 (ref 9–23)
BUN: 13 mg/dL (ref 6–24)
Bilirubin Total: 0.4 mg/dL (ref 0.0–1.2)
CO2: 25 mmol/L (ref 20–29)
Calcium: 9.4 mg/dL (ref 8.7–10.2)
Chloride: 105 mmol/L (ref 96–106)
Creatinine, Ser: 0.74 mg/dL (ref 0.57–1.00)
Globulin, Total: 2.4 g/dL (ref 1.5–4.5)
Glucose: 98 mg/dL (ref 70–99)
Potassium: 4.8 mmol/L (ref 3.5–5.2)
Sodium: 144 mmol/L (ref 134–144)
Total Protein: 6.4 g/dL (ref 6.0–8.5)
eGFR: 93 mL/min/1.73 (ref >59)

## 2024-04-17 LAB — HEMOGLOBIN A1C
Est. average glucose Bld gHb Est-mCnc: 108 mg/dL
Hgb A1c MFr Bld: 5.4 % (ref 4.8–5.6)

## 2024-04-17 LAB — LIPID PANEL
Chol/HDL Ratio: 3.2 ratio (ref 0.0–4.4)
Cholesterol, Total: 241 mg/dL — ABNORMAL HIGH (ref 100–199)
HDL: 75 mg/dL (ref >39)
LDL Chol Calc (NIH): 147 mg/dL — ABNORMAL HIGH (ref 0–99)
Triglycerides: 108 mg/dL (ref 0–149)
VLDL Cholesterol Cal: 19 mg/dL (ref 5–40)

## 2024-04-17 LAB — TSH: TSH: 2.95 u[IU]/mL (ref 0.450–4.500)

## 2024-04-17 LAB — VITAMIN D 25 HYDROXY (VIT D DEFICIENCY, FRACTURES): Vit D, 25-Hydroxy: 53.6 ng/mL (ref 30.0–100.0)

## 2024-04-17 NOTE — Progress Notes (Signed)
 Subjective:    Patient ID: Pamela Esparza, female    DOB: 08-Jan-1966, 58 y.o.   MRN: [id number]  HPI Pamela Esparza is a 58 year old woman who presents to establish care for leg pain  1) Leg pain: -radiates from the back -has leg swelling  -she has been diagnosed with sciatica and herniated disc -she gets steroid injections in [facility] and these help with the back pain -this has happened since 2012 -doing PT -she has a tens unit at home  2) Insomnia: -she takes flexeril and this helps her sleep  3) Nausea: -started with LDN and stopped after she stopped the medicine  4) Cervical spine pain: -not sure if Dr. Bonner has called her -she has pain in both shoulders -she is interested in cervical spine injections -radiates into her arms -she has had prior cervical spine surgery -when she had surgery she was dropping objects -she has tried postural corrections but has muscle spasm She is interested in cervical ESI  5) Low back pain: -she used to get ablations that were helpful for her  Pain Inventory Average Pain 8 Pain Right Now 7 My pain is constant, sharp, burning, dull, stabbing, tingling, and aching  In the last 24 hours, has pain interfered with the following? General activity 8 Relation with others 7 Enjoyment of life 10 What TIME of day is your pain at its worst? morning , daytime, evening, night, and varies Sleep (in general) Fair  Pain is worse with: walking, bending, sitting, inactivity, standing, and some activites Pain improves with: rest, therapy/exercise, pacing activities, medication, TENS, injections, and heat Relief from Meds: 9      Family History  Problem Relation Age of Onset   Breast cancer Mother 57       second cancer at 61   Lung cancer Mother 24   Arthritis Mother    Cancer Mother    COPD Mother    Heart attack Father    Hypertension Father    Diabetes Father    Bladder Cancer Father 48   Arthritis Father    Asthma Father     Cancer Father    COPD Father    Heart disease Father    Stroke Father    Vision loss Father    Lung cancer Maternal Aunt    Cancer Maternal Aunt    Brain cancer Paternal Uncle    Thyroid  disease Paternal Uncle    Birth defects Maternal Grandmother        ovarian   Depression Maternal Grandmother    Uterine cancer Maternal Grandmother        d. 69s   Cancer Maternal Grandmother    Brain cancer Paternal Grandmother    Cancer Paternal Grandmother    Obesity Paternal Grandmother    ADD / ADHD Son    Cancer Paternal Uncle    Early death Paternal Uncle    Obesity Paternal Uncle    Colon cancer Neg Hx    Colon polyps Neg Hx    Esophageal cancer Neg Hx    Stomach cancer Neg Hx    Rectal cancer Neg Hx    Social History   Socioeconomic History   Marital status: Married    Spouse name: Not on file   Number of children: Not on file   Years of education: Not on file   Highest education level: Associate degree: occupational, Scientist, product/process development, or vocational program  Occupational History   Not on file  Tobacco Use  Smoking status: Never    Passive exposure: Never   Smokeless tobacco: Never   Tobacco comments:    Never smoked  Vaping Use   Vaping status: Never Used  Substance and Sexual Activity   Alcohol use: Not Currently    Comment: 4 drinks a year-rare   Drug use: Never   Sexual activity: Not Currently    Comment: Total hysterectomy  Other Topics Concern   Not on file  Social History Narrative   Not on file   Social Drivers of Health   Financial Resource Strain: Low Risk  (08/02/2023)   Overall Financial Resource Strain (CARDIA)    Difficulty of Paying Living Expenses: Not very hard  Food Insecurity: No Food Insecurity (12/16/2023)   Hunger Vital Sign    Worried About Running Out of Food in the Last Year: Never true    Ran Out of Food in the Last Year: Never true  Transportation Needs: No Transportation Needs (08/02/2023)   PRAPARE - Scientist, research (physical sciences) (Medical): No    Lack of Transportation (Non-Medical): No  Physical Activity: Unknown (08/02/2023)   Exercise Vital Sign    Days of Exercise per Week: 0 days    Minutes of Exercise per Session: Not on file  Recent Concern: Physical Activity - Inactive (08/02/2023)   Exercise Vital Sign    Days of Exercise per Week: 0 days    Minutes of Exercise per Session: 0 min  Stress: No Stress Concern Present (08/02/2023)   [employer] of Occupational Health - Occupational Stress Questionnaire    Feeling of Stress : Only a little  Social Connections: Moderately Integrated (08/02/2023)   Social Connection and Isolation Panel    Frequency of Communication with Friends and Family: More than three times a week    Frequency of Social Gatherings with Friends and Family: Never    Attends Religious Services: 1 to 4 times per year    Active Member of [employer] or Organizations: No    Attends Engineer, structural: Not on file    Marital Status: Married   Past Surgical History:  Procedure Laterality Date   ANKLE SURGERY Right    APPENDECTOMY     CHOLECYSTECTOMY     ESOPHAGOGASTRODUODENOSCOPY (EGD) WITH PROPOFOL  N/A 12/05/2020   Procedure: ESOPHAGOGASTRODUODENOSCOPY (EGD) WITH PROPOFOL ;  Surgeon: Janalyn Keene NOVAK, MD;  Location: [facility];  Service: Endoscopy;  Laterality: N/A;   finger sugery     NECK SURGERY     SPINE SURGERY     On my neck   TENDON TRANSFER Right 11/06/2019   Procedure: PERONEAL RECONSTRUCTION WITH PERONEAL LONGUS RIGHT LEG;  Surgeon: Harden Jerona GAILS, MD;  Location: [facility];  Service: Orthopedics;  Laterality: Right;   TOTAL ABDOMINAL HYSTERECTOMY     TUBAL LIGATION N/A    Phreesia 10/13/2020   Past Medical History:  Diagnosis Date   Allergy    Topiramate, Lyrica , aspirin (sensitive to)   Anxiety    Arthritis    Asthma    Cataract    DDD (degenerative disc disease), lumbar    Depression    Depression    Phreesia 10/13/2020    Family history of bladder cancer    Family history of brain cancer 02/15/2023   Fibromyalgia    Hyperlipidemia    Joint pain    Neuromuscular disorder (HCC)    Phreesia 10/13/2020   Neuropathy    Osteoporosis    Sjogren's disease (HCC)  Thyroid  disease    BP (!) 140/79 (BP Location: Left Arm, Patient Position: Sitting, Cuff Size: Large)   Pulse 68   Ht 5' 5 (1.651 m)   Wt 271 lb (122.9 kg)   SpO2 98%   BMI 45.10 kg/m   Opioid Risk Score:   Fall Risk Score:  `1  Depression screen PHQ 2/9     03/06/2024    1:12 PM 01/27/2024   10:06 AM 12/16/2023    8:53 AM 11/24/2023    9:54 AM 11/09/2023   10:35 AM 08/24/2023    2:53 PM 08/03/2023    2:27 PM  Depression screen PHQ 2/9  Decreased Interest 0 0 0 0 1 0 2  Down, Depressed, Hopeless 0 0 0 0 0 0 0  PHQ - 2 Score 0 0 0 0 1 0 2  Altered sleeping 0   0 1 0 2  Tired, decreased energy 0   0 2 0 3  Change in appetite 0   0 0 0 0  Feeling bad or failure about yourself  0   0 0 0 0  Trouble concentrating 0   0 2 3 2   Moving slowly or fidgety/restless 0   0 0 0 0  Suicidal thoughts 0   0 0 0 0  PHQ-9 Score 0   0 6 3 9   Difficult doing work/chores     Somewhat difficult Not difficult at all Very difficult    Review of Systems  Gastrointestinal:  Positive for constipation.  Genitourinary:  Positive for frequency.  Musculoskeletal:  Positive for gait problem.       Pain all over the body  Neurological:  Positive for weakness and numbness.  Psychiatric/Behavioral:  Positive for confusion.        Depression, anxiety  All other systems reviewed and are negative.      Objective:   Physical Exam Gen: no distress, normal appearing HEENT: oral mucosa pink and moist, NCAT Cardio: Reg rate Chest: normal effort, normal rate of breathing Abd: soft, non-distended Ext: no edema Psych: pleasant, normal affect Skin: intact Neuro: Alert and oriented x3, 4/5 strength upper extremities, stable     Assessment & Plan:   1) Chronic  Pain Syndrome secondary to leg pain: -discussed that she had EMG/ testing and this was told to her to be normal -discussed that she used to be strong -discussed plan for steroid injection with Dr. Carilyn -discussed that she avoids processed foods -Discussed current symptoms of pain and history of pain.  -Discussed benefits of exercise in reducing pain.   -discussed that Journavx is a highly selective inhibitor for Nav 1.8, which is specific for pain in the peripheral nervous system, discussed that lidocaine  in contrast affects all Nav receptors, discussed that patient can try using this medication as prn for pain, though its studies have focused on its use for acute pain. Discussed that it has been studied against opioids for acute pain with comparable efficacy. Discussed that I have not seen any patient side effects thus far. Discussed that we have samples available and we have copay cards available. Discussed that outpatient if the medication requires a prior auth the copay should be $30 for at least a 60 day supply. The medication may be more likely to be in stock in [facility] and [facility]. We do have samples available.   -Discussed current symptoms of pain and history of pain.  -Discussed benefits of exercise in reducing pain. -Discussed following foods that may reduce  pain: 1) Ginger (especially studied for arthritis)- reduce leukotriene production to decrease inflammation 2) Blueberries- high in phytonutrients that decrease inflammation 3) Salmon- marine omega-3s reduce joint swelling and pain 4) Pumpkin seeds- reduce inflammation 5) dark chocolate- reduces inflammation 6) turmeric- reduces inflammation 7) tart cherries - reduce pain and stiffness 8) extra virgin olive oil - its compound olecanthal helps to block prostaglandins  9) chili peppers- can be eaten or applied topically via capsaicin 10) mint- helpful for headache, muscle aches, joint pain, and itching 11) garlic- reduces  inflammation 12) Green tea- reduces inflammation and oxidative stress, helps with weight loss, may reduce the risk of cancer, recommend Double Green Matcha [place] of Tea daily  Link to further information on diet for chronic pain: http://www.bray.com/      2) Insomnia: -Try to go outside near sunrise -Get exercise during the day.  -Turn off all devices an hour before bedtime.  -Teas that can benefit: chamomile, valerian root, Brahmi (Bacopa) -Can consider over the counter melatonin, magnesium, and/or L-theanine. Melatonin is an anti-oxidant with multiple health benefits. Magnesium is involved in greater than 300 enzymatic reactions in the body and most of us  are deficient as our soil is often depleted. There are 7 different types of magnesium- Bioptemizer's is a supplement with all 7 types, and each has unique benefits. Magnesium can also help with constipation and anxiety.  -Pistachios naturally increase the production of melatonin -Cozy Earth bamboo bed sheets are free from toxic chemicals.  -Tart cherry juice or a tart cherry supplement can improve sleep and soreness post-workout    Link to further information on diet for chronic pain: http://www.bray.com/    3) Facet arthropathy: -discussed that she has had medial branch blocks -referred to Dr. Carilyn for medial branch blocks  4) Constipation:  -Provided list of following foods that help with constipation and highlighted a few: 1) prunes- contain high amounts of fiber.  2) apples- has a form of dietary fiber called pectin that accelerates stool movement and increases beneficial gut bacteria 3) pears- in addition to fiber, also high in fructose and sorbitol which have laxative effect 4) figs- contain an enzyme ficin which helps to speed colonic transit 5) kiwis- contain an enzyme  actinidin that improves gut motility and reduces constipation 6) oranges- rich in pectin (like apples) 7) grapefruits- contain a flavanol naringenin which has a laxative effect 8) vegetables- rich in fiber and also great sources of folate, vitamin C, and K 9) artichoke- high in inulin, prebiotic great for the microbiome 10) chicory- increases stool frequency and softness (can be added to coffee) 11) rhubarb- laxative effect 12) sweet potato- high fiber 13) beans, peas, and lentils- contain both soluble and insoluble fiber 14) chia seeds- improves intestinal health and gut flora 15) flaxseeds- laxative effect 16) whole grain rye bread- high in fiber 17) oat bran- high in soluble and insoluble fiber 18) kefir- softens stools -recommended to try at least one of these foods every day.  -drink 6-8 glasses of water per day -walk regularly, especially after meals.   5) Shortness of breath: -discussed that she had a thorough eval and her heart is working well  6) Cervical spine pain:  -discussed her cervical radiculopathy, discussed getting an MRI cervical spine, discussed that she has been receiving 10mg  Percocet from another provider but that they have moved far away and so she would like to schedule with us , discussed that we can probably get her in to see our nurse practitioner Fidela Ned  next week and that we would have to do pain contract and urine sample at this appointment  -referred to Dr. Charlie Dolores for cervical ESI, placed new referral since she has not heard from their office  -discussed that Journavx is a highly selective inhibitor for Nav 1.8, which is specific for pain in the peripheral nervous system, discussed that lidocaine  in contrast affects all Nav receptors, discussed that patient can try using this medication as prn for pain, though its studies have focused on its use for acute pain. Discussed that it has been studied against opioids for acute pain with comparable  efficacy. Discussed that I have not seen any patient side effects thus far. Discussed that we have samples available and we have copay cards available. Discussed that outpatient if the medication requires a prior auth the copay should be $30 for at least a 60 day supply. The medication may be more likely to be in stock in [facility] and [facility]. We do have samples available.   -Discussed Qutenza as an option for neuropathic pain control. Discussed that this is a capsaicin patch, stronger than capsaicin cream. Discussed that it is currently approved for diabetic peripheral neuropathy and post-herpetic neuralgia, but that it has also shown benefit in treating other forms of neuropathy. Provided patient with link to site to learn more about the patch: https://www.clark.biz/. Discussed that the patch would be placed in office and benefits usually last 3 months. Discussed that unintended exposure to capsaicin can cause severe irritation of eyes, mucous membranes, respiratory tract, and skin, but that Qutenza is a local treatment and does not have the systemic side effects of other nerve medications. Discussed that there may be pain, itching, erythema, and decreased sensory function associated with the application of Qutenza. Side effects usually subside within 1 week. A cold pack of analgesic medications can help with these side effects. Blood pressure can also be increased due to pain associated with administration of the patch.   7) Neuropathy: -continue voltaren  -Discussed Qutenza as an option for neuropathic pain control. Discussed that this is a capsaicin patch, stronger than capsaicin cream. Discussed that it is currently approved for diabetic peripheral neuropathy and post-herpetic neuralgia, but that it has also shown benefit in treating other forms of neuropathy. Provided patient with link to site to learn more about the patch: https://www.clark.biz/. Discussed that the patch would be placed in office and  benefits usually last 3 months. Discussed that unintended exposure to capsaicin can cause severe irritation of eyes, mucous membranes, respiratory tract, and skin, but that Qutenza is a local treatment and does not have the systemic side effects of other nerve medications. Discussed that there may be pain, itching, erythema, and decreased sensory function associated with the application of Qutenza. Side effects usually subside within 1 week. A cold pack of analgesic medications can help with these side effects. Blood pressure can also be increased due to pain associated with administration of the patch.   -frankincense and myrrh  8) Lumbar radiculopathy:  -discussed avoiding processed foods  -Discussed Qutenza as an option for neuropathic pain control. Discussed that this is a capsaicin patch, stronger than capsaicin cream. Discussed that it is currently approved for diabetic peripheral neuropathy and post-herpetic neuralgia, but that it has also shown benefit in treating other forms of neuropathy. Provided patient with link to site to learn more about the patch: https://www.clark.biz/. Discussed that the patch would be placed in office and benefits usually last 3 months. Discussed that unintended exposure  to capsaicin can cause severe irritation of eyes, mucous membranes, respiratory tract, and skin, but that Qutenza is a local treatment and does not have the systemic side effects of other nerve medications. Discussed that there may be pain, itching, erythema, and decreased sensory function associated with the application of Qutenza. Side effects usually subside within 1 week. A cold pack of analgesic medications can help with these side effects. Blood pressure can also be increased due to pain associated with administration of the patch.   -referred to Dr. Carilyn for steroid injection

## 2024-04-17 NOTE — Patient Instructions (Signed)
 Foods that may reduce pain: 1) Ginger (especially studied for arthritis)- reduce leukotriene production to decrease inflammation 2) Blueberries- high in phytonutrients that decrease inflammation 3) Salmon- marine omega-3s reduce joint swelling and pain 4) Pumpkin seeds- reduce inflammation 5) dark chocolate- reduces inflammation 6) turmeric- reduces inflammation 7) tart cherries - reduce pain and stiffness 8) extra virgin olive oil - its compound olecanthal helps to block prostaglandins  9) chili peppers- can be eaten or applied topically via capsaicin 10) mint- helpful for headache, muscle aches, joint pain, and itching 11) garlic- reduces inflammation 12) Green tea- reduces inflammation and oxidative stress, helps with weight loss, may reduce the risk of cancer, recommend Double Green Matcha [place] of Tea daily  Link to further information on diet for chronic pain: http://www.bray.com/   Insomnia: -Try to go outside near sunrise -Get exercise during the day.  -Turn off all devices an hour before bedtime.  -Teas that can benefit: chamomile, valerian root, Brahmi (Bacopa) -Can consider over the counter melatonin, magnesium, and/or L-theanine. Melatonin is an anti-oxidant with multiple health benefits. Magnesium is involved in greater than 300 enzymatic reactions in the body and most of [place] are deficient as our soil is often depleted. There are 7 different types of magnesium- Bioptemizer's is a supplement with all 7 types, and each has unique benefits. Magnesium can also help with constipation and anxiety.  -Pistachios naturally increase the production of melatonin -Cozy Earth bamboo bed sheets are free from toxic chemicals.  -Tart cherry juice or a tart cherry supplement can improve sleep and soreness post-workout

## 2024-04-19 ENCOUNTER — Telehealth: Payer: Self-pay | Admitting: Registered Nurse

## 2024-04-19 NOTE — Telephone Encounter (Signed)
 Requesting refill for percocet [facility] on [address]

## 2024-04-20 ENCOUNTER — Other Ambulatory Visit: Payer: Self-pay | Admitting: Physical Medicine and Rehabilitation

## 2024-04-20 MED ORDER — OXYCODONE-ACETAMINOPHEN 10-325 MG PO TABS
1.0000 | ORAL_TABLET | Freq: Two times a day (BID) | ORAL | 0 refills | Status: DC | PRN
Start: 2024-04-20 — End: 2024-05-30

## 2024-04-20 NOTE — Telephone Encounter (Signed)
 PMP was Reviewed.  Oxycodone  was filled on 03/27/2024. Dr Lorilee seen patient on 04/17/2024. Sent a message to Dr lorilee to ask if there was a reason why she didn't fill the Oxycodone . She stated she will send in the prescription.

## 2024-04-23 ENCOUNTER — Other Ambulatory Visit

## 2024-04-26 NOTE — Progress Notes (Deleted)
  PROCEDURE RECORD [facility]   Name: Pamela Esparza DOB:29-Sep-1965 MRN: [id number]  Date:04/26/2024  Physician: Dr. Prentice Compton    Nurse/CMA: Tye Kitty MA  Allergies:  Allergies  Allergen Reactions   Aspirin Other (See Comments)    stomach upset stomach upset    Hydrocodone-Acetaminophen  Other (See Comments)    made her sick made her sick    Azithromycin      Patient states it does not work for her   Chlorhexidine      Pt reports possible chlorarpep allergy   Codeine     Other reaction(s): Upset stomach (finding)   Lyrica  [Pregabalin ]    Solu-Medrol  [Methylprednisolone ] Itching   Topiramate     Consent Signed: {yes no:314532}  Is patient diabetic? {yes no:314532}  CBG today? ***  Pregnant: {yes no:314532} LMP: No LMP recorded. Patient has had a hysterectomy. (age [age])  Anticoagulants: {Yes/No:19989} Anti-inflammatory: {Yes/No:19989} Antibiotics: {Yes/No:19989}  Procedure: Medial Branch Block  Position: Prone Start Time: ***  End Time: ***  Fluoro Time: ***  RN/CMA      Time      BP      Pulse      Respirations      O2 Sat      S/S      Pain Level       D/C home with ***, patient A & O X 3, D/C instructions reviewed, and sits independently.         Subjective:    Patient ID: Pamela Esparza, female    DOB: April 19, 1966, 58 y.o.   MRN: [id number]  HPI    Review of Systems     Objective:   Physical Exam        Assessment & Plan:

## 2024-04-27 ENCOUNTER — Encounter: Payer: Self-pay | Admitting: Physical Medicine & Rehabilitation

## 2024-04-27 ENCOUNTER — Ambulatory Visit: Payer: Self-pay

## 2024-04-27 ENCOUNTER — Encounter: Attending: Physical Medicine and Rehabilitation | Admitting: Physical Medicine & Rehabilitation

## 2024-04-27 ENCOUNTER — Ambulatory Visit (INDEPENDENT_AMBULATORY_CARE_PROVIDER_SITE_OTHER)

## 2024-04-27 ENCOUNTER — Ambulatory Visit
Admission: RE | Admit: 2024-04-27 | Discharge: 2024-04-27 | Disposition: A | Discharge status: Home / Self Care | Source: Ambulatory Visit

## 2024-04-27 ENCOUNTER — Telehealth: Payer: Self-pay

## 2024-04-27 VITALS — BP 127/78 | HR 63 | Temp 97.8°F | Ht 65.0 in | Wt 269.0 lb

## 2024-04-27 VITALS — BP 136/70 | HR 62 | Ht 65.0 in | Wt 271.2 lb

## 2024-04-27 DIAGNOSIS — E66813 Obesity, class 3: Secondary | ICD-10-CM

## 2024-04-27 DIAGNOSIS — M47816 Spondylosis without myelopathy or radiculopathy, lumbar region: Secondary | ICD-10-CM | POA: Insufficient documentation

## 2024-04-27 DIAGNOSIS — Z23 Encounter for immunization: Secondary | ICD-10-CM

## 2024-04-27 DIAGNOSIS — Z6841 Body Mass Index (BMI) 40.0 and over, adult: Secondary | ICD-10-CM

## 2024-04-27 DIAGNOSIS — R350 Frequency of micturition: Secondary | ICD-10-CM

## 2024-04-27 DIAGNOSIS — E7800 Pure hypercholesterolemia, unspecified: Secondary | ICD-10-CM

## 2024-04-27 DIAGNOSIS — M25561 Pain in right knee: Secondary | ICD-10-CM | POA: Insufficient documentation

## 2024-04-27 DIAGNOSIS — Z780 Asymptomatic menopausal state: Secondary | ICD-10-CM | POA: Diagnosis not present

## 2024-04-27 DIAGNOSIS — G629 Polyneuropathy, unspecified: Secondary | ICD-10-CM

## 2024-04-27 DIAGNOSIS — M47819 Spondylosis without myelopathy or radiculopathy, site unspecified: Secondary | ICD-10-CM | POA: Insufficient documentation

## 2024-04-27 LAB — POCT URINALYSIS DIP (CLINITEK)
Bilirubin, UA: NEGATIVE
Blood, UA: NEGATIVE
Glucose, UA: NEGATIVE mg/dL
Ketones, POC UA: NEGATIVE mg/dL
Leukocytes, UA: NEGATIVE
Nitrite, UA: NEGATIVE
POC PROTEIN,UA: 100 — AB
Spec Grav, UA: >=1.03 — AB (ref 1.010–1.025)
Urobilinogen, UA: 0.2 U/dL
pH, UA: 5.5 (ref 5.0–8.0)

## 2024-04-27 MED ORDER — FLUTICASONE PROPIONATE 50 MCG/ACT NA SUSP: 1.0000 | Freq: Every day | NASAL | 2 refills | Status: AC | PRN

## 2024-04-27 MED ORDER — GABAPENTIN 300 MG PO CAPS: 300.0000 mg | ORAL_CAPSULE | Freq: Every day | ORAL | 2 refills | Status: AC

## 2024-04-27 MED ORDER — GABAPENTIN 600 MG PO TABS: ORAL_TABLET | ORAL | 0 refills | Status: DC

## 2024-04-27 MED ORDER — ALBUTEROL SULFATE HFA 108 (90 BASE) MCG/ACT IN AERS: INHALATION_SPRAY | RESPIRATORY_TRACT | 1 refills | Status: DC

## 2024-04-27 NOTE — Telephone Encounter (Signed)
 Called patient to see if an earlier appointment time would work due to cancellations on schedule for today.  Patient to arrive between 12:45-1:00 and advised that we will get patient back as soon as room opens.  Patient verbalized understanding.

## 2024-04-27 NOTE — Assessment & Plan Note (Signed)
 Chronic urinary urgency and incontinence with sensation of incomplete voiding. Recent urinalysis showed no infection. Pelvic floor weakness due to childbirth and age may contribute to symptoms. - Send urine culture to rule out infection. - Provide information on pelvic floor exercises for at-home practice.

## 2024-04-27 NOTE — Progress Notes (Signed)
 Established Patient Office Visit  Subjective   Patient ID: Pamela Esparza, female    DOB: [birth year]  Age: 58 y.o. MRN: [id number]  Chief Complaint  Patient presents with   Medical Management of Chronic Issues    Possible UTI    HPI History of Present Illness Pamela Esparza is a 58 year old female with osteoarthritis and Sjogren's syndrome who presents with weight management and knee pain.  Weight management difficulties - Seeking assistance with weight management - Previously used Wegovy  with good effect, discontinued due to high cost - Tried Saxenda  without success - Participated in a weight loss program with a nutritionist, but not recently due to a 1.5 month stay in [place]  - Gained weight during stay in [place] , but has since returned to previous weight - Avoids sugar in diet - Primarily performs water exercises due to pain  Knee pain and functional limitation - Persistent knee pain, most severe in the right knee - History of osteoarthritis in the knees - Received prior steroid injections for symptom relief - Pain severe enough to cause periods of being bedridden - Difficulty walking due to pain - Swelling present in the right knee  Lower urinary tract symptoms - Pressure in the lower abdomen - Sensation of incomplete bladder emptying - No dysuria, hematuria, or urinary pain  Hyperlipidemia and medication history - History of elevated cholesterol - Previously on atorvastatin , discontinued for several months - Lipid levels stable off medication prior to recent dietary changes - Recent dietary changes while in [place]  may have affected cholesterol levels      ROS Per HPI.    Objective:     BP 127/78   Pulse 63   Temp 97.8 F (36.6 C) (Oral)   Ht 5' 5 (1.651 m)   Wt 269 lb 0.6 oz (122 kg)   SpO2 99%   BMI 44.77 kg/m    Physical Exam Constitutional:      General: She is not in acute distress.    Appearance: Normal appearance.  HENT:     Right Ear:  Tympanic membrane normal.     Left Ear: Tympanic membrane normal.     Mouth/Throat:     Mouth: Mucous membranes are moist.     Pharynx: Oropharynx is clear.  Eyes:     Pupils: Pupils are equal, round, and reactive to light.  Cardiovascular:     Rate and Rhythm: Normal rate and regular rhythm.     Heart sounds: Normal heart sounds. No murmur heard.    No friction rub. No gallop.  Pulmonary:     Effort: Pulmonary effort is normal. No respiratory distress.     Breath sounds: Normal breath sounds.  Abdominal:     General: Abdomen is flat. Bowel sounds are normal.     Palpations: Abdomen is soft.  Musculoskeletal:        General: No swelling.     Cervical back: Normal range of motion.  Lymphadenopathy:     Cervical: No cervical adenopathy.  Skin:    General: Skin is warm and dry.  Neurological:     General: No focal deficit present.     Mental Status: She is alert.  Psychiatric:        Mood and Affect: Mood normal.        Behavior: Behavior normal.        Thought Content: Thought content normal.      Results for orders placed or performed in visit on 04/27/24  POCT URINALYSIS DIP (CLINITEK)  Result Value Ref Range   Color, UA yellow yellow   Clarity, UA cloudy (A) clear   Glucose, UA negative negative mg/dL   Bilirubin, UA negative negative   Ketones, POC UA negative negative mg/dL   Spec Grav, UA >=8.969 (A) 1.010 - 1.025   Blood, UA negative negative   pH, UA 5.5 5.0 - 8.0   POC PROTEIN,UA =100 (A) negative, trace   Urobilinogen, UA 0.2 0.2 or 1.0 E.U./dL   Nitrite, UA Negative Negative   Leukocytes, UA Negative Negative    Last CBC Lab Results  Component Value Date   WBC 5.2 04/16/2024   HGB 12.8 04/16/2024   HCT 40.2 04/16/2024   MCV 95 04/16/2024   MCH 30.2 04/16/2024   RDW 13.0 04/16/2024   PLT 247 04/16/2024   Last metabolic panel Lab Results  Component Value Date   GLUCOSE 98 04/16/2024   NA 144 04/16/2024   K 4.8 04/16/2024   CL 105 04/16/2024    CO2 25 04/16/2024   BUN 13 04/16/2024   CREATININE 0.74 04/16/2024   EGFR 93 04/16/2024   CALCIUM  9.4 04/16/2024   PROT 6.4 04/16/2024   ALBUMIN 4.0 04/16/2024   LABGLOB 2.4 04/16/2024   AGRATIO 1.4 12/20/2022   BILITOT 0.4 04/16/2024   ALKPHOS 81 04/16/2024   AST 25 04/16/2024   ALT 20 04/16/2024   ANIONGAP 12 10/31/2023   Last lipids Lab Results  Component Value Date   CHOL 241 (H) 04/16/2024   HDL 75 04/16/2024   LDLCALC 147 (H) 04/16/2024   LDLDIRECT 146 (H) 08/14/2019   TRIG 108 04/16/2024   CHOLHDL 3.2 04/16/2024   Last hemoglobin A1c Lab Results  Component Value Date   HGBA1C 5.4 04/16/2024   Last thyroid  functions Lab Results  Component Value Date   TSH 2.950 04/16/2024   T3TOTAL 115 10/14/2020      The 10-year ASCVD risk score (Arnett DK, et al., 2019) is: 3.7%    Assessment & Plan:   Postmenopausal estrogen deficiency -     DG Bone Density; Future  Neuropathy -     Gabapentin ; TAKE 1 TABLET(600 MG) BY MOUTH THREE TIMES DAILY  Dispense: 270 tablet; Refill: 0  Encounter for Prevnar pneumococcal vaccination -     Pneumococcal conjugate vaccine 20-valent  Class 3 severe obesity due to excess calories with serious comorbidity and body mass index (BMI) of 45.0 to 49.9 in adult Assessment & Plan: Starting weight: 289. Current weight: 269. Obesity with previous weight loss using Wegovy , discontinued due to cost. Saxenda  was ineffective. Family history of obesity and inability to perform floor exercises due to pain. Despite dietary efforts, including sugar avoidance, weight loss has plateaued. - Refer to [facility] for weight management support, dietary adjustments, and potential medication options, including insurance coverage for medications like Wegovy .  Orders: -     Amb Ref to Medical Weight Management  Acute pain of right knee Assessment & Plan: Chronic osteoarthritis with recent exacerbation, severe pain, and  popping in the right knee. Previous x-rays confirmed osteoarthritis, and steroid injections have been effective. - Order x-ray of right knee at [facility]. - Consult with pain specialist for potential steroid injections.  Orders: -     DG Knee Complete 4 Views Right; Future  Frequency of urination Assessment & Plan: Chronic urinary urgency and incontinence with sensation of incomplete voiding. Recent urinalysis showed no infection. Pelvic floor weakness due to  childbirth and age may contribute to symptoms. - Send urine culture to rule out infection. - Provide information on pelvic floor exercises for at-home practice.   Orders: -     POCT URINALYSIS DIP (CLINITEK) -     Urine Culture  Pure hypercholesterolemia Assessment & Plan: Last lipid panel: LDL 147, HDL 75, Trig 108. Hyperlipidemia previously well-controlled off atorvastatin . Recent dietary changes may have affected lipid levels. High HDL provides cardiovascular protection. Recheck lipid levels in six months. - Consider restarting atorvastatin  if lipid levels remain elevated.   Other orders -     Albuterol  Sulfate HFA; INHALE 1 TO 2 PUFFS INTO THE LUNGS EVERY 6 HOURS AS NEEDED  Dispense: 54 g; Refill: 1 -     Fluticasone  Propionate; Place 1 spray into both nostrils daily as needed for allergies.  Dispense: 9.9 mL; Refill: 2 -     Gabapentin ; Take 1 capsule (300 mg total) by mouth at bedtime.  Dispense: 90 capsule; Refill: 2    Assessment and Plan Assessment & Plan Obesity Obesity with previous weight loss using Wegovy , discontinued due to cost. Saxenda  was ineffective. Family history of obesity and inability to perform floor exercises due to pain. Despite dietary efforts, including sugar avoidance, weight loss has plateaued. - Refer to [facility] for weight management support, dietary adjustments, and potential medication options, including insurance coverage for medications like  Wegovy .  Osteoarthritis of right knees Chronic osteoarthritis with recent exacerbation, severe pain, and popping in the right knee. Previous x-rays confirmed osteoarthritis, and steroid injections have been effective. - Order x-ray of right knees at [facility]. - Consult with pain specialist for potential steroid injections.  Hyperlipidemia Hyperlipidemia previously well-controlled off atorvastatin . Recent dietary changes may have affected lipid levels. High HDL provides cardiovascular protection. Last lipid panel: LDL 147, HDL 75, Trig 108.  - Recheck lipid levels in six months. - Consider restarting atorvastatin  if lipid levels remain elevated.  Urinary urgency and incontinence Chronic urinary urgency and incontinence with sensation of incomplete voiding. Recent urinalysis showed no infection. Pelvic floor weakness due to childbirth and age may contribute to symptoms. - Send urine culture to rule out infection. - Provide information on pelvic floor exercises for at-home practice.   Return in about 6 months (around 10/28/2024) for HLD, thyroid , weight.    Saddie JULIANNA Sacks, PA-C

## 2024-04-27 NOTE — Assessment & Plan Note (Signed)
 Last lipid panel: LDL 147, HDL 75, Trig 108. Hyperlipidemia previously well-controlled off atorvastatin . Recent dietary changes may have affected lipid levels. High HDL provides cardiovascular protection. Recheck lipid levels in six months. - Consider restarting atorvastatin  if lipid levels remain elevated.

## 2024-04-27 NOTE — Progress Notes (Signed)
 Referred for steroid injection.  Prior Auth was obtained for lumbar medial branch blocks however after reviewing history with the patient she has had a lumbar radiofrequency procedure to denervate the L4-5 and L5-S1 facet joint complexes.  This was performed on 12/15/2023.  We discussed that it is too early to repeat at this time and that doing medial branch blocks at this time would really not be helpful.  Spoke to referring physician who states that she was thinking the patient may benefit from L5-S1 epidurals.  The prior Auth was not obtained for that procedure today.  The patient feels like while she does have some sciatic pain remaining issues the low back.  Historically the patient has had approximately 15-month relief of 50% with lumbar radiofrequency of L4-L5 medial branches and L5 dorsal ramus.  She feels like it may be starting to wear off . We will get her on the schedule for that next month and obtain prior authorization.  I reviewed MRI images from October 2023.  Independent review demonstrating moderate facet arthropathy bilateral L3-4 L4-5 L5-S1 which would correlate with her symptoms. MRI LUMBAR SPINE WITHOUT CONTRAST   TECHNIQUE: Multiplanar, multisequence MR imaging of the lumbar spine was performed. No intravenous contrast was administered.   COMPARISON:  MRI of lumbar spine 02/17/2021   FINDINGS: Segmentation: 5 non rib-bearing lumbar type vertebral bodies are present. The lowest fully formed vertebral body is L5.   Alignment: Slight degenerative anterolisthesis at L3-4 is stable mild straightening of the normal lumbar lordosis is similar to the prior exam.   Vertebrae:  Marrow signal and vertebral body heights are normal.   Conus medullaris and cauda equina: Conus extends to the L1-2 level. Conus and cauda equina appear normal.   Paraspinal and other soft tissues: Limited imaging the abdomen is unremarkable. There is no significant adenopathy. No solid organ lesions are  present.   Disc levels:   T12-L1: Normal disc signal and height is present. No focal protrusion or stenosis is present.   L1-2: Normal disc signal and height is present. No focal protrusion or stenosis is present.   L2-3: A leftward disc protrusion is again noted. Mild facet hypertrophy is present bilaterally. Mild foraminal narrowing is again noted, left greater than right.   L3-4: A broad-based disc protrusion is present. Moderate facet hypertrophy is noted bilaterally. Mild foraminal narrowing is present on the left. The central canal is patent.   L4-5: A broad-based disc protrusion is asymmetric to the left. A far left annular tear is again noted. Mild foraminal narrowing is worse on the left. Central canal is patent.   L5-S1: A left paramedian disc protrusion is again seen. This is similar the prior study with displacement of the traversing S1 nerve roots and probable contact of the right S1 nerve roots. Mild left foraminal narrowing is stable.   IMPRESSION: 1. Stable appearance of the lumbar spine. 2. Left paramedian disc protrusion at L5-S1 with displacement of the traversing S1 nerve roots and probable contact of the right S1 nerve roots. 3. Mild left foraminal narrowing at L5-S1 is stable. 4. Mild foraminal narrowing bilaterally at L2-3 and L3-4 is worse on the left at both levels. 5. Mild bilateral foraminal narrowing at L4-5 is also worse on the left with a far left annular tear.     Electronically Signed   By: Lonni Necessary M.D.   On: 07/06/2022 06:44

## 2024-04-27 NOTE — Assessment & Plan Note (Signed)
 Starting weight: 289. Current weight: 269. Obesity with previous weight loss using Wegovy , discontinued due to cost. Saxenda  was ineffective. Family history of obesity and inability to perform floor exercises due to pain. Despite dietary efforts, including sugar avoidance, weight loss has plateaued. - Refer to [facility] for weight management support, dietary adjustments, and potential medication options, including insurance coverage for medications like Wegovy .

## 2024-04-27 NOTE — Patient Instructions (Signed)
 VISIT SUMMARY: Today, we discussed your weight management challenges, knee pain, and other health concerns including Sjogren's syndrome, hyperlipidemia, and urinary symptoms. We have outlined a plan to address each of these issues.  YOUR PLAN: OBESITY: You have had difficulty managing your weight, especially after stopping Wegovy  due to cost and finding Saxenda  ineffective. -We will refer you to the [facility] for support with weight management, dietary adjustments, and exploring medication options that may be covered by your insurance.  OSTEOARTHRITIS OF BILATERAL KNEES: You have chronic osteoarthritis in your knees, with severe pain and functional limitations, especially in your right knee. -We will order an x-ray of both knees at [facility]. -We will consult with a pain specialist to discuss the possibility of steroid injections for pain relief.  FIBROMYALGIA: You have chronic fibromyalgia that causes widespread pain, which is sometimes worsened by weather changes. -Continue your current medications. -Use supportive measures like ice and Voltaren gel to help manage the pain.  SJOGREN'S SYNDROME: You experience chronic dehydration and thirst despite drinking plenty of water, which may be related to your Sjogren's syndrome. -Consider electrolyte supplementation if your symptoms continue.  HYPERLIPIDEMIA: Your cholesterol levels have been stable off medication, but recent dietary changes may have affected them. -We will recheck your lipid levels in six months. -Consider restarting atorvastatin  if your cholesterol levels remain high.  URINARY URGENCY AND INCONTINENCE: You have chronic urinary urgency and incontinence, with a sensation of incomplete bladder emptying. -We will send a urine culture to rule out any infection. -We will provide information on pelvic floor exercises that you can practice at home to help manage your symptoms.  If you have  any problems before your next visit feel free to message me via MyChart (minor issues or questions) or call the office, otherwise you may reach out to schedule an office visit.  Thank you! Saddie Sacks, PA-C

## 2024-04-27 NOTE — Assessment & Plan Note (Addendum)
 Chronic osteoarthritis with recent exacerbation, severe pain, and popping in the right knee. Previous x-rays confirmed osteoarthritis, and steroid injections have been effective. - Order x-ray of right knee at [facility]. - Consult with pain specialist for potential steroid injections.

## 2024-04-29 LAB — URINE CULTURE

## 2024-05-01 ENCOUNTER — Ambulatory Visit (INDEPENDENT_AMBULATORY_CARE_PROVIDER_SITE_OTHER): Admitting: Orthopedic Surgery

## 2024-05-01 ENCOUNTER — Encounter (INDEPENDENT_AMBULATORY_CARE_PROVIDER_SITE_OTHER): Payer: Self-pay

## 2024-05-01 ENCOUNTER — Encounter: Admitting: Physical Medicine & Rehabilitation

## 2024-05-01 ENCOUNTER — Encounter: Payer: Self-pay | Admitting: Orthopedic Surgery

## 2024-05-01 DIAGNOSIS — M25562 Pain in left knee: Secondary | ICD-10-CM | POA: Diagnosis not present

## 2024-05-01 DIAGNOSIS — M25561 Pain in right knee: Secondary | ICD-10-CM

## 2024-05-01 DIAGNOSIS — G8929 Other chronic pain: Secondary | ICD-10-CM

## 2024-05-01 MED ORDER — METHYLPREDNISOLONE ACETATE 40 MG/ML IJ SUSP
40.0000 mg | INTRAMUSCULAR | Status: AC | PRN
  Administered 2024-05-01: 40 mg via INTRA_ARTICULAR

## 2024-05-01 MED ORDER — LIDOCAINE HCL 1 % IJ SOLN
5.0000 mL | INTRAMUSCULAR | Status: AC | PRN
  Administered 2024-05-01: 5 mL

## 2024-05-01 NOTE — Progress Notes (Signed)
 Office Visit Note   Patient: Pamela Esparza           Date of Birth: 1966-02-18           MRN: [id number] Visit Date: 05/01/2024              Requested by: Gayle Saddie FALCON, PA-C [address] [place],  [place] [address] PCP: Gayle Saddie FALCON, [place]  Chief Complaint  Patient presents with   Right Knee - Follow-up   Left Knee - Follow-up     Patient is a 58 year old woman who presents with bilateral knee pain.  Patient HPI: States she has crepitation and popping in the patellofemoral joint with range of motion.  Assessment & Plan: Visit Diagnoses:  1. Chronic pain of both knees     Plan: Both knees were injected she tolerated this well.  With the lateral tilt and lateral tracking of the patella recommended VMO strengthening.  Follow-Up Instructions: Return if symptoms worsen or fail to improve.   Ortho Exam  Patient is alert, oriented, no adenopathy, well-dressed, normal affect, normal respiratory effort. Examination patient has a lateral tilt and lateral tracking of both patella.  There is crepitation of patellofemoral joint with range of motion.  There is no effusion.  Collaterals and cruciates are stable bilaterally.  Medial lateral joint line are nontender to palpation.  Pain primarily in the patellofemoral joint.  Review of the radiographs shows spurring of the patellofemoral joint but no abnormality of the medial lateral joint line.    Imaging: No results found. No images are attached to the encounter.  Labs: Lab Results  Component Value Date   HGBA1C 5.4 04/16/2024   HGBA1C 5.5 08/18/2023   HGBA1C 5.6 12/20/2022     Lab Results  Component Value Date   ALBUMIN 4.0 04/16/2024   ALBUMIN 4.3 08/18/2023   ALBUMIN 4.2 12/20/2022    No results found for: MG Lab Results  Component Value Date   VD25OH 53.6 04/16/2024   VD25OH 65.4 08/18/2023   VD25OH 50.25 09/14/2021    No results found for: PREALBUMIN    Latest Ref Rng & Units 04/16/2024     8:46 AM 10/31/2023   12:04 PM 08/18/2023    8:48 AM  CBC EXTENDED  WBC 3.4 - 10.8 x10E3/uL 5.2  6.1  7.6   RBC 3.77 - 5.28 x10E6/uL 4.24  4.40  4.55   Hemoglobin 11.1 - 15.9 g/dL 87.1  85.8  85.4   HCT 34.0 - 46.6 % 40.2  42.2  43.8   Platelets 150 - 450 x10E3/uL 247  313  270   NEUT# 1.4 - 7.0 x10E3/uL 2.6   4.2   Lymph# 0.7 - 3.1 x10E3/uL 2.1   2.6      There is no height or weight on file to calculate BMI.  Orders:  No orders of the defined types were placed in this encounter.  No orders of the defined types were placed in this encounter.    Procedures: Large Joint Inj: bilateral knee on 05/01/2024 5:29 PM Indications: pain and diagnostic evaluation Details: 22 G 1.5 in needle, anteromedial approach  Arthrogram: No  Medications (Right): 5 mL lidocaine  1 %; 40 mg methylPREDNISolone  acetate 40 MG/ML Medications (Left): 5 mL lidocaine  1 %; 40 mg methylPREDNISolone  acetate 40 MG/ML Outcome: tolerated well, no immediate complications Procedure, treatment alternatives, risks and benefits explained, specific risks discussed. Consent was given by the patient. Immediately prior to procedure a time out was  called to verify the correct patient, procedure, equipment, support staff and site/side marked as required. Patient was prepped and draped in the usual sterile fashion.      Clinical Data: No additional findings.  ROS:  All other systems negative, except as noted in the HPI. Review of Systems  Objective: Vital Signs: There were no vitals taken for this visit.  Specialty Comments:  No specialty comments available.  PMFS History: Patient Active Problem List   Diagnosis Date Noted   Acute pain of right knee 04/27/2024   Facet arthropathy 04/27/2024   Polyp of colon 01/27/2023   Frequency of urination 09/18/2022   Hyperlipidemia 04/28/2022   Pituitary microadenoma (HCC) 09/14/2021   Primary osteoarthritis 09/03/2021   Sjogren's disease (HCC) 04/16/2021   Spondylosis  of lumbar region without myelopathy or radiculopathy 02/04/2021   Essential (primary) hypertension 02/02/2021   Class 3 severe obesity due to excess calories with serious comorbidity and body mass index (BMI) of 45.0 to 49.9 in adult 02/02/2021   Chronic pain syndrome 01/12/2021   Peroneal tendinitis, right leg    Multiple atypical nevi 08/14/2019   Haglund's deformity of right heel 06/25/2019   Hypothyroidism 03/21/2019   Chronic joint pain 10/23/2018   DDD (degenerative disc disease), lumbar 10/23/2018   Muscle pain, fibromyalgia 10/23/2018   Lumbosacral spondylosis without myelopathy 08/20/2015   Neuropathy 07/11/2013   Past Medical History:  Diagnosis Date   Allergy    Topiramate, Lyrica , aspirin (sensitive to)   Anxiety    Arthritis    Asthma    Cataract    DDD (degenerative disc disease), lumbar    Depression    Depression    [employer]reesia 10/13/2020   Family history of bladder cancer    Family history of brain cancer 02/15/2023   Fibromyalgia    Hyperlipidemia    Joint pain    Neuromuscular disorder (HCC)    Phreesia 10/13/2020   Neuropathy    Osteoporosis    Sjogren's disease (HCC)    Thyroid  disease     Family History  Problem Relation Age of Onset   Breast cancer Mother 65       second cancer at 28   Lung cancer Mother 13   Arthritis Mother    Cancer Mother    COPD Mother    Heart attack Father    Hypertension Father    Diabetes Father    Bladder Cancer Father 21   Arthritis Father    Asthma Father    Cancer Father    COPD Father    Heart disease Father    Stroke Father    Vision loss Father    Lung cancer Maternal Aunt    Cancer Maternal Aunt    Brain cancer Paternal Uncle    Thyroid  disease Paternal Uncle    Birth defects Maternal Grandmother        ovarian   Depression Maternal Grandmother    Uterine cancer Maternal Grandmother        d. 81s   Cancer Maternal Grandmother    Brain cancer Paternal Grandmother    Cancer Paternal Grandmother     Obesity Paternal Grandmother    ADD / ADHD Son    Cancer Paternal Uncle    Early death Paternal Uncle    Obesity Paternal Uncle    Colon cancer Neg Hx    Colon polyps Neg Hx    Esophageal cancer Neg Hx    Stomach cancer Neg Hx    Rectal cancer  Neg Hx     Past Surgical History:  Procedure Laterality Date   ANKLE SURGERY Right    APPENDECTOMY     CHOLECYSTECTOMY     ESOPHAGOGASTRODUODENOSCOPY (EGD) WITH PROPOFOL  N/A 12/05/2020   Procedure: ESOPHAGOGASTRODUODENOSCOPY (EGD) WITH PROPOFOL ;  Surgeon: Janalyn Keene NOVAK, MD;  Location: [facility];  Service: Endoscopy;  Laterality: N/A;   finger sugery     NECK SURGERY     SPINE SURGERY     On my neck   TENDON TRANSFER Right 11/06/2019   Procedure: PERONEAL RECONSTRUCTION WITH PERONEAL LONGUS RIGHT LEG;  Surgeon: Harden Jerona GAILS, MD;  Location: [facility];  Service: Orthopedics;  Laterality: Right;   TOTAL ABDOMINAL HYSTERECTOMY     TUBAL LIGATION N/A    [employer]reesia 10/13/2020   Social History   Occupational History   Not on file  Tobacco Use   Smoking status: Never    Passive exposure: Never   Smokeless tobacco: Never   Tobacco comments:    Never smoked  Vaping Use   Vaping status: Never Used  Substance and Sexual Activity   Alcohol use: Not Currently    Comment: 4 drinks a year-rare   Drug use: Never   Sexual activity: Not Currently    Comment: Total hysterectomy

## 2024-05-17 ENCOUNTER — Ambulatory Visit: Admitting: Dietician

## 2024-05-29 ENCOUNTER — Telehealth: Payer: Self-pay

## 2024-05-29 NOTE — Progress Notes (Unsigned)
 [address] [place], [place], [place] [address] Office: [phone]  /  Fax: [phone]   Initial Consultation    Pamela Esparza was seen in clinic today to evaluate for obesity. She is interested in losing weight to improve overall health and reduce the risk of weight related complications. She presents today to review program treatment options, initial physical assessment, and evaluation.    Anthropometrics and Bioimpedance Analysis   There is no height or weight on file to calculate BMI. Body Fat Mass : *** % Visceral Fat Mass Rating : *** Waist to Height Ratio: ***  Obesity Related Diseases and Complications  Obesity Quality of Life and Psychosocial Complications: {emqolpsychosoc:33006::Reduced health-related quality of life}  Cardiometabolic: {emcardiometabolic complications:33007}  Biomechanical: {embiomechanical:33008}   Weight Related History  She was referred by: {emreferby:28303}  When asked what they would like to accomplish? She states: {EMHopetoaccomplish:28304::Adopt a healthier eating pattern and lifestyle,Improve energy levels and physical activity,Improve existing medical conditions,Improve quality of life}  Weight history: ***  Highest weight: ***  Contributing factors: {EMcontributingfactors:28307}  Prior weight loss attempts: {emweightlossprograms:31590::None}  Current or previous pharmacotherapy: {EM previousRx:28311}  Response to medication: {EMResponsetomedication:28312}  Current nutrition plan: {EMNutritionplan:28309::None}  Greatest challenge with dieting: {emgreatestchallengediet:31593}.  Current level of physical activity: {EMcurrentPA:28310::None}  Barriers to Exercise: {embarrierstoexercise:32606::no barriers}  Readiness and Motivation  On a scale from 0 to 10 How ready are you to make changes to your eating and physical activity to lose weight? {NUMBER 1-10:22536} How important is it for you to lose weight right  now ? {NUMBER 1-10:22536} How confident are you that you can lose weight if you try? {NUMBER [id number]  Past Medical History   Past Medical History:  Diagnosis Date   Allergy    Topiramate, Lyrica , aspirin (sensitive to)   Anxiety    Arthritis    Asthma    Cataract    DDD (degenerative disc disease), lumbar    Depression    Depression    [employer] 10/13/2020   Family history of bladder cancer    Family history of brain cancer 02/15/2023   Fibromyalgia    Hyperlipidemia    Joint pain    Neuromuscular disorder (HCC)    [employer] 10/13/2020   Neuropathy    Osteoporosis    Sjogren's disease (HCC)    Thyroid  disease      Objective    There were no vitals taken for this visit. She was weighed on the bioimpedance scale: There is no height or weight on file to calculate BMI.    General:  Alert, oriented and cooperative. Patient is in no acute distress.  Respiratory: Normal respiratory effort, no problems with respiration noted   Gait: able to ambulate independently  Mental Status: Normal mood and affect. Normal behavior. Normal judgment and thought content.   Diagnostic Data Reviewed  BMET    Component Value Date/Time   NA 144 04/16/2024 0846   K 4.8 04/16/2024 0846   CL 105 04/16/2024 0846   CO2 25 04/16/2024 0846   GLUCOSE 98 04/16/2024 0846   GLUCOSE 98 10/31/2023 1204   BUN 13 04/16/2024 0846   CREATININE 0.74 04/16/2024 0846   CALCIUM  9.4 04/16/2024 0846   GFRNONAA >60 10/31/2023 1204   GFRAA 110 10/14/2020 1109   Lab Results  Component Value Date   HGBA1C 5.4 04/16/2024   HGBA1C 5.4 03/21/2019   No results found for: INSULIN  CBC    Component Value Date/Time   WBC 5.2 04/16/2024 0846   WBC 6.1 10/31/2023 1204  RBC 4.24 04/16/2024 0846   RBC 4.40 10/31/2023 1204   HGB 12.8 04/16/2024 0846   HCT 40.2 04/16/2024 0846   PLT 247 04/16/2024 0846   MCV 95 04/16/2024 0846   MCH 30.2 04/16/2024 0846   MCH 32.0 10/31/2023 1204   MCHC 31.8 04/16/2024  0846   MCHC 33.4 10/31/2023 1204   RDW 13.0 04/16/2024 0846   Iron/TIBC/Ferritin/ %Sat    Component Value Date/Time   FERRITIN 119 12/24/2020 0912   Lipid Panel     Component Value Date/Time   CHOL 241 (H) 04/16/2024 0846   TRIG 108 04/16/2024 0846   HDL 75 04/16/2024 0846   CHOLHDL 3.2 04/16/2024 0846   LDLCALC 147 (H) 04/16/2024 0846   LDLDIRECT 146 (H) 08/14/2019 1155   Hepatic Function Panel     Component Value Date/Time   PROT 6.4 04/16/2024 0846   ALBUMIN 4.0 04/16/2024 0846   AST 25 04/16/2024 0846   ALT 20 04/16/2024 0846   ALKPHOS 81 04/16/2024 0846   BILITOT 0.4 04/16/2024 0846   BILIDIR 0.12 11/25/2020 1501      Component Value Date/Time   TSH 2.950 04/16/2024 0846    Medications  Outpatient Encounter Medications as of 05/30/2024  Medication Sig   albuterol  (VENTOLIN  HFA) 108 (90 Base) MCG/ACT inhaler INHALE 1 TO 2 PUFFS INTO THE LUNGS EVERY 6 HOURS AS NEEDED   ALPRAZolam  (XANAX ) 0.5 MG tablet Take 0.5-1 tablets (0.25-0.5 mg total) by mouth daily as needed. (Patient taking differently: Take 0.25-0.5 mg by mouth daily as needed for anxiety.)   Cholecalciferol (VITAMIN D3) 75 MCG (3000 UT) TABS Take 5,000 Int'l Units by mouth daily.   co-enzyme Q-10 30 MG capsule Take 30 mg by mouth daily.   cyclobenzaprine (FLEXERIL) 5 MG tablet Take 5 mg by mouth 3 (three) times daily as needed for muscle spasms.   DULoxetine (CYMBALTA) 60 MG capsule Take 120 mg by mouth daily.   fluticasone  (FLONASE ) 50 MCG/ACT nasal spray Place 1 spray into both nostrils daily as needed for allergies.   gabapentin  (NEURONTIN ) 300 MG capsule Take 1 capsule (300 mg total) by mouth at bedtime.   gabapentin  (NEURONTIN ) 600 MG tablet TAKE 1 TABLET(600 MG) BY MOUTH THREE TIMES DAILY   hydroxychloroquine (PLAQUENIL) 200 MG tablet Take 200 mg by mouth 2 (two) times daily.   levothyroxine  (SYNTHROID ) 50 MCG tablet Take 1 tablet (50 mcg total) by mouth daily before breakfast.   meloxicam  (MOBIC ) 15  MG tablet Take 1 tablet (15 mg total) by mouth daily as needed for pain.   Multiple Vitamins-Minerals (CENTRUM SILVER PO) Take by mouth.   nitroGLYCERIN  (NITROSTAT ) 0.4 MG SL tablet Place 1 tablet (0.4 mg total) under the tongue every 5 (five) minutes as needed for chest pain.   nystatin  (MYCOSTATIN /NYSTOP ) powder Apply topically 2 (two) times daily. (Patient taking differently: Apply 1 Application topically daily as needed (for dry skin).)   oxyCODONE -acetaminophen  (PERCOCET) 10-325 MG tablet Take 1 tablet by mouth 2 (two) times daily as needed for pain.   pilocarpine (SALAGEN) 5 MG tablet Take 5 mg by mouth 3 (three) times daily.   Probiotic Product (PROBIOTIC PO) Take 1 tablet by mouth at bedtime.   Semaglutide -Weight Management (WEGOVY ) 0.5 MG/0.5ML SOAJ Inject 0.5 mg into the skin once a week.   verapamil (CALAN-SR) 120 MG CR tablet Take 120 mg by mouth daily.   No facility-administered encounter medications on file as of 05/30/2024.     Assessment and Plan   There are  no diagnoses linked to this encounter.     Obesity Treatment and Action Plan:  {EMobesityactionplanscribe:28314::Patient will work on garnering support from family and friends to begin weight loss journey.,Will work on eliminating or reducing the presence of highly palatable, calorie dense foods in the home.,Will complete provided nutritional and psychosocial assessment questionnaire before the next appointment.,Will be scheduled for indirect calorimetry to determine resting energy expenditure in a fasting state.  This will allow us  to create a reduced calorie, high-protein meal plan to promote loss of fat mass while preserving muscle mass.,Counseled on the health benefits of losing 5%-15% of total body weight.,Was counseled on nutritional approaches to weight loss and benefits of reducing processed foods and consuming plant-based foods and high quality protein as part of nutritional weight management.,Was counseled  on pharmacotherapy and role as an adjunct in weight management. }  Education and Additional resources  She was weighed on the bioimpedance scale and results were discussed and documented in the synopsis.  We discussed obesity as a progressive, chronic disease and the importance of a more detailed evaluation of all the factors contributing to the disease.  We reviewed the basic principles in obesity management.   We discussed the importance of long term lifestyle changes which include nutrition, exercise and behavioral modification as well as the importance of customizing this to her specific health and social needs.  We reviewed the role of medical interventions including pharmacotherapy and surgical interventions.   We discussed the benefits of reaching a healthier weight to alleviate the symptoms of existing conditions and reduce the risks of the biomechanical, cardiometabolic and psychological effects of obesity.  We reviewed our program approach and philosophy, which are guided by the four pillars of obesity medicine.  We discussed how to prepare for intake appointment and the importance of fasting and avoidance of stimulants for at least 8 hours prior to indirect calorimetry.  Connor M Kernodle appears to be in the action stage of change and reports being ready to initiate intensive lifestyle and behavioral modifications as part of their weight loss journey.  Attestation  Reviewed by clinician on day of visit: allergies, medications, problem list, medical history, surgical history, family history, social history, and previous encounter notes pertinent to obesity diagnosis.  I have spent *** minutes in the care of the patient today including: {NUMBER 1-10:22536} minutes before the visit reviewing and preparing the chart. *** minutes face-to-face {emfacetoface:32598::assessing and reviewing listed medical problems as outlined in obesity care plan,providing nutritional and behavioral  counseling on topics outlined in the obesity care plan,independently interpreting test results and goals of care, as described in assessment and plan,reviewing and discussing biometric information and progress} {NUMBER 1-10:22536} minutes after the visit updating chart and documentation of encounter.  Seith Aikey ANP-C

## 2024-05-29 NOTE — Telephone Encounter (Signed)
 Patient calling into the office requesting a refill on Oxycodone .  Please advise if refill is deemed appropriate

## 2024-05-30 ENCOUNTER — Encounter (INDEPENDENT_AMBULATORY_CARE_PROVIDER_SITE_OTHER): Payer: Self-pay | Admitting: Nurse Practitioner

## 2024-05-30 ENCOUNTER — Ambulatory Visit (INDEPENDENT_AMBULATORY_CARE_PROVIDER_SITE_OTHER): Admitting: Nurse Practitioner

## 2024-05-30 ENCOUNTER — Telehealth: Payer: Self-pay | Admitting: Registered Nurse

## 2024-05-30 VITALS — BP 135/83 | HR 68 | Temp 97.9°F | Ht 65.0 in | Wt 267.0 lb

## 2024-05-30 DIAGNOSIS — M25562 Pain in left knee: Secondary | ICD-10-CM

## 2024-05-30 DIAGNOSIS — M47819 Spondylosis without myelopathy or radiculopathy, site unspecified: Secondary | ICD-10-CM

## 2024-05-30 DIAGNOSIS — G894 Chronic pain syndrome: Secondary | ICD-10-CM

## 2024-05-30 DIAGNOSIS — I10 Essential (primary) hypertension: Secondary | ICD-10-CM

## 2024-05-30 DIAGNOSIS — Z6841 Body Mass Index (BMI) 40.0 and over, adult: Secondary | ICD-10-CM

## 2024-05-30 DIAGNOSIS — R7303 Prediabetes: Secondary | ICD-10-CM | POA: Diagnosis not present

## 2024-05-30 DIAGNOSIS — E039 Hypothyroidism, unspecified: Secondary | ICD-10-CM

## 2024-05-30 DIAGNOSIS — E7800 Pure hypercholesterolemia, unspecified: Secondary | ICD-10-CM | POA: Diagnosis not present

## 2024-05-30 DIAGNOSIS — E66813 Obesity, class 3: Secondary | ICD-10-CM

## 2024-05-30 DIAGNOSIS — M25561 Pain in right knee: Secondary | ICD-10-CM

## 2024-05-30 DIAGNOSIS — M47816 Spondylosis without myelopathy or radiculopathy, lumbar region: Secondary | ICD-10-CM

## 2024-05-30 DIAGNOSIS — Z0289 Encounter for other administrative examinations: Secondary | ICD-10-CM

## 2024-05-30 DIAGNOSIS — G8929 Other chronic pain: Secondary | ICD-10-CM

## 2024-05-30 MED ORDER — OXYCODONE-ACETAMINOPHEN 10-325 MG PO TABS: 1.0000 | ORAL_TABLET | Freq: Two times a day (BID) | ORAL | 0 refills | Status: DC | PRN

## 2024-05-30 NOTE — Telephone Encounter (Signed)
 PMP was Reviewed UDS reviewed  Oxycodone  e-scribed to pharmacy.  Call placed to Ms. Proto regarding the above, she verbalizes understanding.

## 2024-06-28 ENCOUNTER — Encounter: Admitting: Physical Medicine & Rehabilitation

## 2024-07-02 ENCOUNTER — Encounter: Payer: Self-pay | Admitting: Family Medicine

## 2024-07-02 ENCOUNTER — Ambulatory Visit: Admitting: Family Medicine

## 2024-07-02 ENCOUNTER — Ambulatory Visit: Payer: Self-pay

## 2024-07-02 VITALS — BP 120/72 | HR 62 | Temp 97.9°F | Resp 18 | Ht 65.0 in | Wt 271.8 lb

## 2024-07-02 DIAGNOSIS — N898 Other specified noninflammatory disorders of vagina: Secondary | ICD-10-CM

## 2024-07-02 DIAGNOSIS — J0111 Acute recurrent frontal sinusitis: Secondary | ICD-10-CM

## 2024-07-02 MED ORDER — AMOXICILLIN-POT CLAVULANATE 875-125 MG PO TABS: 1.0000 | ORAL_TABLET | Freq: Two times a day (BID) | ORAL | 0 refills | Status: AC

## 2024-07-02 MED ORDER — FLUCONAZOLE 150 MG PO TABS: 150.0000 mg | ORAL_TABLET | Freq: Once | ORAL | 0 refills | Status: AC

## 2024-07-02 NOTE — Progress Notes (Signed)
 Acute Office Visit  Subjective:    Patient ID: Pamela Esparza, female    DOB: [birth year], 58 y.o.   MRN: [id number]  Chief Complaint  Patient presents with   Cough    Discussed the use of AI scribe software for clinical note transcription with the patient, who gave verbal consent to proceed.  History of Present Illness   Pamela Esparza is a 58 year old female with a history of bronchitis and sinus infections who presents with a cough and sinus pressure.  Cough and upper respiratory symptoms - Cough present for a little over one week - Associated symptoms include fever, chills, and sore throat (currently scratchy) - Intermittent ear pressure and pain - Fever began on the second day of illness, approximately one week ago - No fever for the past two days without use of antipyretics - Believes illness was contracted from her husband, who had similar symptoms  Sinus pressure and pain - History of annual bronchitis and sinus infections - Sinus pressure and pain localized to frontal and maxillary regions - Pain and pressure worsen when bending over, causing a 'stagger in my forehead' sensation  Symptom management and medication tolerance - Symptom management includes DayQuil, NyQuil, inhaler, Flonase , essential oils with diffuser, and Claritin D (most effective) - Uses honey and tea to soothe throat, which was particularly painful three days into illness - History of poor response to erythromycin - History of yeast infections with Augmentin , typically requiring Diflucan  for management of side effects      Past Medical History:  Diagnosis Date   Allergy    Topiramate, Lyrica , aspirin (sensitive to)   Anxiety    Arthritis    At around 37yrs   Asthma    Cataract    DDD (degenerative disc disease), lumbar    Depression    Depression    Phreesia 10/13/2020   Family history of bladder cancer    Family history of brain cancer 02/15/2023   Fibromyalgia    Hyperlipidemia     Joint pain    Neuromuscular disorder (HCC) 2013   Phreesia 10/13/2020   Neuropathy    Osteoporosis    Sjogren's disease    Thyroid  disease     Past Surgical History:  Procedure Laterality Date   ANKLE SURGERY Right    APPENDECTOMY     CHOLECYSTECTOMY     ESOPHAGOGASTRODUODENOSCOPY (EGD) WITH PROPOFOL  N/A 12/05/2020   Procedure: ESOPHAGOGASTRODUODENOSCOPY (EGD) WITH PROPOFOL ;  Surgeon: Janalyn Keene NOVAK, MD;  Location: [facility];  Service: Endoscopy;  Laterality: N/A;   finger sugery     NECK SURGERY     SPINE SURGERY     On my neck   TENDON TRANSFER Right 11/06/2019   Procedure: PERONEAL RECONSTRUCTION WITH PERONEAL LONGUS RIGHT LEG;  Surgeon: Harden Jerona GAILS, MD;  Location: [facility];  Service: Orthopedics;  Laterality: Right;   TOTAL ABDOMINAL HYSTERECTOMY     TUBAL LIGATION N/A    Phreesia 10/13/2020    Family History  Problem Relation Age of Onset   Breast cancer Mother 70       second cancer at 1   Lung cancer Mother 81   Arthritis Mother    Cancer Mother    COPD Mother    Heart attack Father    Hypertension Father    Diabetes Father    Bladder Cancer Father 62   Arthritis Father    Asthma Father    Cancer Father    COPD  Father    Heart disease Father    Stroke Father    Vision loss Father    Lung cancer Maternal Aunt    Cancer Maternal Aunt    Brain cancer Paternal Uncle    Thyroid  disease Paternal Uncle    Birth defects Maternal Grandmother        ovarian   Depression Maternal Grandmother    Uterine cancer Maternal Grandmother        d. 46s   Cancer Maternal Grandmother    Obesity Maternal Grandmother    Brain cancer Paternal Grandmother    Cancer Paternal Grandmother    Obesity Paternal Grandmother    ADD / ADHD Son    Cancer Paternal Uncle    Early death Paternal Uncle    Obesity Paternal Uncle    Asthma Daughter    Miscarriages / [place] Daughter    Colon cancer Neg Hx    Colon polyps Neg Hx    Esophageal cancer  Neg Hx    Stomach cancer Neg Hx    Rectal cancer Neg Hx     Social History   Socioeconomic History   Marital status: Married    Spouse name: Not on file   Number of children: Not on file   Years of education: Not on file   Highest education level: Associate degree: occupational, Scientist, product/process development, or vocational program  Occupational History   Not on file  Tobacco Use   Smoking status: Never    Passive exposure: Never   Smokeless tobacco: Never   Tobacco comments:    Never smoked  Vaping Use   Vaping status: Never Used  Substance and Sexual Activity   Alcohol use: Not Currently    Comment: 4 drinks a year-rare   Drug use: Never   Sexual activity: Not Currently    Comment: Total hysterectomy  Other Topics Concern   Not on file  Social History Narrative   Not on file   Social Drivers of Health   Financial Resource Strain: Low Risk  (04/23/2024)   Overall Financial Resource Strain (CARDIA)    Difficulty of Paying Living Expenses: Not very hard  Food Insecurity: No Food Insecurity (04/23/2024)   Hunger Vital Sign    Worried About Running Out of Food in the Last Year: Never true    Ran Out of Food in the Last Year: Never true  Transportation Needs: No Transportation Needs (04/23/2024)   PRAPARE - Administrator, Civil Service (Medical): No    Lack of Transportation (Non-Medical): No  Physical Activity: Inactive (04/23/2024)   Exercise Vital Sign    Days of Exercise per Week: 0 days    Minutes of Exercise per Session: Not on file  Stress: No Stress Concern Present (04/23/2024)   [employer] of Occupational Health - Occupational Stress Questionnaire    Feeling of Stress: Only a little  Social Connections: Moderately Isolated (04/23/2024)   Social Connection and Isolation Panel    Frequency of Communication with Friends and Family: More than three times a week    Frequency of Social Gatherings with Friends and Family: Patient declined    Attends Religious Services:  Patient declined    Database administrator or Organizations: No    Attends Banker Meetings: Not on file    Marital Status: Married  Intimate Partner Violence: Not At Risk (12/27/2022)   Humiliation, Afraid, Rape, and Kick questionnaire    Fear of Current or Ex-Partner: No  Emotionally Abused: No    Physically Abused: No    Sexually Abused: No    Outpatient Medications Prior to Visit  Medication Sig Dispense Refill   albuterol  (VENTOLIN  HFA) 108 (90 Base) MCG/ACT inhaler INHALE 1 TO 2 PUFFS INTO THE LUNGS EVERY 6 HOURS AS NEEDED 54 g 1   ALPRAZolam  (XANAX ) 0.5 MG tablet Take 0.5-1 tablets (0.25-0.5 mg total) by mouth daily as needed. 10 tablet 0   Cholecalciferol (VITAMIN D3) 75 MCG (3000 UT) TABS Take 5,000 Int'l Units by mouth daily.     co-enzyme Q-10 30 MG capsule Take 30 mg by mouth daily.     cyclobenzaprine (FLEXERIL) 5 MG tablet Take 5 mg by mouth 3 (three) times daily as needed for muscle spasms.     DULoxetine (CYMBALTA) 60 MG capsule Take 120 mg by mouth daily.     fluticasone  (FLONASE ) 50 MCG/ACT nasal spray Place 1 spray into both nostrils daily as needed for allergies. 9.9 mL 2   gabapentin  (NEURONTIN ) 300 MG capsule Take 1 capsule (300 mg total) by mouth at bedtime. 90 capsule 2   gabapentin  (NEURONTIN ) 600 MG tablet TAKE 1 TABLET(600 MG) BY MOUTH THREE TIMES DAILY 270 tablet 0   hydroxychloroquine (PLAQUENIL) 200 MG tablet Take 200 mg by mouth 2 (two) times daily.     levothyroxine  (SYNTHROID ) 50 MCG tablet Take 1 tablet (50 mcg total) by mouth daily before breakfast. 90 tablet 3   meloxicam  (MOBIC ) 15 MG tablet Take 1 tablet (15 mg total) by mouth daily as needed for pain. 90 tablet 3   Multiple Vitamins-Minerals (CENTRUM SILVER PO) Take by mouth.     nitroGLYCERIN  (NITROSTAT ) 0.4 MG SL tablet Place 1 tablet (0.4 mg total) under the tongue every 5 (five) minutes as needed for chest pain. 10 tablet 1   nystatin  (MYCOSTATIN /NYSTOP ) powder Apply topically 2  (two) times daily. 15 g 2   oxyCODONE -acetaminophen  (PERCOCET) 10-325 MG tablet Take 1 tablet by mouth 2 (two) times daily as needed for pain. 60 tablet 0   pilocarpine (SALAGEN) 5 MG tablet Take 5 mg by mouth 3 (three) times daily.     Probiotic Product (PROBIOTIC PO) Take 1 tablet by mouth at bedtime.     verapamil (CALAN-SR) 120 MG CR tablet Take 120 mg by mouth daily.     Semaglutide -Weight Management (WEGOVY ) 0.5 MG/0.5ML SOAJ Inject 0.5 mg into the skin once a week. (Patient not taking: Reported on 07/02/2024) 2 mL 3   No facility-administered medications prior to visit.    Allergies  Allergen Reactions   Aspirin Other (See Comments)    stomach upset stomach upset    Hydrocodone-Acetaminophen  Other (See Comments)    made her sick made her sick    Azithromycin      Patient states it does not work for her   Chlorhexidine      Pt reports possible chlorarpep allergy   Codeine     Other reaction(s): Upset stomach (finding)   Lyrica  [Pregabalin ]    Solu-Medrol  [Methylprednisolone ] Itching   Topiramate     Review of Systems  Constitutional:  Positive for fever (about a week ago). Negative for chills, diaphoresis and fatigue.  HENT:  Positive for congestion, sinus pressure and sore throat. Negative for ear pain and sinus pain.   Eyes: Negative.   Respiratory:  Positive for cough, chest tightness, shortness of breath and wheezing.   Cardiovascular:  Negative for chest pain.  Gastrointestinal:  Negative for abdominal pain, constipation, diarrhea, nausea and vomiting.  Endocrine: Negative.   Genitourinary:  Negative for dysuria, frequency and urgency.  Musculoskeletal:  Negative for arthralgias.  Skin: Negative.   Allergic/Immunologic: Negative.   Neurological:  Negative for dizziness, weakness, light-headedness and headaches.  Hematological: Negative.   Psychiatric/Behavioral:  Negative for dysphoric mood. The patient is not nervous/anxious.        Objective:         07/02/2024    1:25 PM 05/30/2024    8:00 AM 04/27/2024    1:14 PM  Vitals with BMI  Height 5' 5 5' 5 5' 5  Weight 271 lbs 13 oz 267 lbs 271 lbs 3 oz  BMI 45.23 44.43 45.13  Systolic 120 135 863  Diastolic 72 83 70  Pulse 62 68 62    No data found.   Physical Exam Vitals reviewed.  Constitutional:      General: She is not in acute distress.    Appearance: Normal appearance. She is obese. She is ill-appearing.  HENT:     Right Ear: Tenderness present. Tympanic membrane is erythematous.     Left Ear: Tympanic membrane is erythematous.     Ears:     Comments: R>L    Nose: Congestion present.     Mouth/Throat:     Pharynx: Posterior oropharyngeal erythema present.  Cardiovascular:     Rate and Rhythm: Normal rate and regular rhythm.     Heart sounds: Normal heart sounds. No murmur heard. Pulmonary:     Effort: Pulmonary effort is normal. No respiratory distress.     Breath sounds: Examination of the right-lower field reveals decreased breath sounds. Examination of the left-lower field reveals decreased breath sounds. Decreased breath sounds present. No wheezing or rhonchi.  Abdominal:     General: Bowel sounds are normal.     Palpations: Abdomen is soft.     Tenderness: There is no abdominal tenderness.  Musculoskeletal:        General: Normal range of motion.  Skin:    General: Skin is warm.  Neurological:     Mental Status: She is alert. Mental status is at baseline.  Psychiatric:        Mood and Affect: Mood normal.        Behavior: Behavior normal.     Health Maintenance Due  Topic Date Due   COVID-19 Vaccine (3 - Moderna risk series) 04/30/2020   Hepatitis B Vaccines 19-59 Average Risk (3 of 3 - 19+ 3-dose series) 08/13/2021   Medicare Annual Wellness (AWV)  12/27/2023   Influenza Vaccine  Never done       Topic Date Due   Hepatitis B Vaccines 19-59 Average Risk (3 of 3 - 19+ 3-dose series) 08/13/2021     Lab Results  Component Value Date   TSH 2.950  04/16/2024   Lab Results  Component Value Date   WBC 5.2 04/16/2024   HGB 12.8 04/16/2024   HCT 40.2 04/16/2024   MCV 95 04/16/2024   PLT 247 04/16/2024   Lab Results  Component Value Date   NA 144 04/16/2024   K 4.8 04/16/2024   CO2 25 04/16/2024   GLUCOSE 98 04/16/2024   BUN 13 04/16/2024   CREATININE 0.74 04/16/2024   BILITOT 0.4 04/16/2024   ALKPHOS 81 04/16/2024   AST 25 04/16/2024   ALT 20 04/16/2024   PROT 6.4 04/16/2024   ALBUMIN 4.0 04/16/2024   CALCIUM  9.4 04/16/2024   ANIONGAP 12 10/31/2023   EGFR 93 04/16/2024   GFR 93.43 08/09/2023  Lab Results  Component Value Date   CHOL 241 (H) 04/16/2024   Lab Results  Component Value Date   HDL 75 04/16/2024   Lab Results  Component Value Date   LDLCALC 147 (H) 04/16/2024   Lab Results  Component Value Date   TRIG 108 04/16/2024   Lab Results  Component Value Date   CHOLHDL 3.2 04/16/2024   Lab Results  Component Value Date   HGBA1C 5.4 04/16/2024        Results for orders placed or performed in visit on 04/27/24  POCT URINALYSIS DIP (CLINITEK)   Collection Time: 04/27/24 11:14 AM  Result Value Ref Range   Color, UA yellow yellow   Clarity, UA cloudy (A) clear   Glucose, UA negative negative mg/dL   Bilirubin, UA negative negative   Ketones, POC UA negative negative mg/dL   Spec Grav, UA >=8.969 (A) 1.010 - 1.025   Blood, UA negative negative   pH, UA 5.5 5.0 - 8.0   POC PROTEIN,UA =100 (A) negative, trace   Urobilinogen, UA 0.2 0.2 or 1.0 E.U./dL   Nitrite, UA Negative Negative   Leukocytes, UA Negative Negative  Urine Culture   Collection Time: 04/27/24 11:17 AM   Specimen: Urine   Urine  Result Value Ref Range   Urine Culture, Routine Final report    Organism ID, Bacteria Comment      Assessment & Plan:   Assessment & Plan Acute recurrent frontal sinusitis Acute sinusitis with associated cough and ear pain Acute sinusitis likely viral, persisting over a week with ear  involvement. Afebrile for two days, likely not contagious. No pneumonia signs. - Prescribed Augmentin  for 7 days, twice daily. - Discussed Augmentin  side effects: gastrointestinal symptoms, yeast infections. - Provided Diflucan  150 mg, two doses, for yeast infection prophylaxis. - Advised emergency care if shortness of breath develops. - Encouraged hand hygiene, mask use if coughing persists. - Advised hydration and rest.  Orders:   amoxicillin -clavulanate (AUGMENTIN ) 875-125 MG tablet; Take 1 tablet by mouth 2 (two) times daily for 7 days.  Vaginal itching  Orders:   fluconazole  (DIFLUCAN ) 150 MG tablet; Take 1 tablet (150 mg total) by mouth once for 1 dose.     Follow-up: Return if symptoms worsen or fail to improve.  An After Visit Summary was printed and given to the patient.  Harrie Cedar, FNP [facility] [phone]

## 2024-07-02 NOTE — Telephone Encounter (Signed)
 FYI Only or Action Required?: FYI only for provider.  Patient was last seen in primary care on 05/30/2024 by Jude Lonell BRAVO, NP.  Called Nurse Triage reporting Cough.  Symptoms began a week ago.  Interventions attempted: tea with honey, vit c, dayquil, nyquil.  Symptoms are: gradually worsening.  Triage Disposition: See Physician Within 24 Hours  Patient/caregiver understands and will follow disposition?: Yes  Reason for Disposition  Earache is present  Answer Assessment - Initial Assessment Questions Non-productive cough and ear pressure x 1 week. No improvement with vit c, dayquil, nyquil, inhaler, tea with honey. States feels similar to previous hx of bronchitis.  ONSET: When did the cough begin?      One week  DIFFICULTY BREATHING: Are you having difficulty breathing? If Yes, ask: How bad is it? (e.g., mild, moderate, severe)      Mild, believes she has bronchitis  6. FEVER: Do you have a fever? If Yes, ask: What is your temperature, how was it measured, and when did it start?     Patient believes she had a fever at onset, has resolved  7. CARDIAC HISTORY: Do you have any history of heart disease? (e.g., heart attack, congestive heart failure)      Denies  8. LUNG HISTORY: Do you have any history of lung disease?  (e.g., pulmonary embolus, asthma, emphysema)     Asthma  9. PE RISK FACTORS: Do you have a history of blood clots? (or: recent major surgery, recent prolonged travel, bedridden)     Denies  Protocols used: Cough - Acute Non-Productive-A-AH 1  Message from Watts G sent at 07/02/2024  8:05 AM EDT  pt think she has a sinus infection and bronchitis also pressure in her ears. Please follow up with pt

## 2024-07-03 ENCOUNTER — Encounter (INDEPENDENT_AMBULATORY_CARE_PROVIDER_SITE_OTHER): Payer: Self-pay

## 2024-07-04 ENCOUNTER — Ambulatory Visit (INDEPENDENT_AMBULATORY_CARE_PROVIDER_SITE_OTHER): Admitting: Nurse Practitioner

## 2024-07-04 ENCOUNTER — Other Ambulatory Visit: Payer: Self-pay

## 2024-07-10 ENCOUNTER — Telehealth: Payer: Self-pay

## 2024-07-10 NOTE — Telephone Encounter (Signed)
 Copied from CRM [id number]. Topic: Clinical - Medication Question >> Jul 10, 2024  1:58 PM Fonda T wrote: Reason for CRM: Patient calling, states per provider advised to call office if needed another pill for yeast infection.  Patient is calling to request another pill for yeast infection.   Patient can be reached if need to discuss further, [phone].  [id number] [address], [place] - [phone] AT [facility] & [address] [place] [address] Phone: [phone] Fax: [phone]

## 2024-07-11 ENCOUNTER — Other Ambulatory Visit: Payer: Self-pay

## 2024-07-11 MED ORDER — FLUCONAZOLE 150 MG PO TABS: 150.0000 mg | ORAL_TABLET | Freq: Once | ORAL | 0 refills | Status: AC

## 2024-07-11 NOTE — Telephone Encounter (Signed)
 Rx sent to [facility]

## 2024-07-12 ENCOUNTER — Other Ambulatory Visit (INDEPENDENT_AMBULATORY_CARE_PROVIDER_SITE_OTHER): Payer: Self-pay | Admitting: Family Medicine

## 2024-07-12 ENCOUNTER — Encounter (INDEPENDENT_AMBULATORY_CARE_PROVIDER_SITE_OTHER): Payer: Self-pay | Admitting: Family Medicine

## 2024-07-12 ENCOUNTER — Ambulatory Visit (INDEPENDENT_AMBULATORY_CARE_PROVIDER_SITE_OTHER): Admitting: Family Medicine

## 2024-07-12 VITALS — BP 121/82 | HR 66 | Temp 97.8°F | Ht 65.0 in | Wt 269.0 lb

## 2024-07-12 DIAGNOSIS — E7800 Pure hypercholesterolemia, unspecified: Secondary | ICD-10-CM

## 2024-07-12 DIAGNOSIS — R0609 Other forms of dyspnea: Secondary | ICD-10-CM

## 2024-07-12 DIAGNOSIS — M2550 Pain in unspecified joint: Secondary | ICD-10-CM

## 2024-07-12 DIAGNOSIS — E559 Vitamin D deficiency, unspecified: Secondary | ICD-10-CM

## 2024-07-12 DIAGNOSIS — E785 Hyperlipidemia, unspecified: Secondary | ICD-10-CM

## 2024-07-12 DIAGNOSIS — E039 Hypothyroidism, unspecified: Secondary | ICD-10-CM | POA: Diagnosis not present

## 2024-07-12 DIAGNOSIS — Z1331 Encounter for screening for depression: Secondary | ICD-10-CM | POA: Diagnosis not present

## 2024-07-12 DIAGNOSIS — I10 Essential (primary) hypertension: Secondary | ICD-10-CM

## 2024-07-12 DIAGNOSIS — R7303 Prediabetes: Secondary | ICD-10-CM

## 2024-07-12 DIAGNOSIS — D649 Anemia, unspecified: Secondary | ICD-10-CM

## 2024-07-12 DIAGNOSIS — G8929 Other chronic pain: Secondary | ICD-10-CM

## 2024-07-12 DIAGNOSIS — R5383 Other fatigue: Secondary | ICD-10-CM | POA: Diagnosis not present

## 2024-07-12 DIAGNOSIS — Z6841 Body Mass Index (BMI) 40.0 and over, adult: Secondary | ICD-10-CM

## 2024-07-12 DIAGNOSIS — E669 Obesity, unspecified: Secondary | ICD-10-CM

## 2024-07-12 DIAGNOSIS — K760 Fatty (change of) liver, not elsewhere classified: Secondary | ICD-10-CM

## 2024-07-12 NOTE — Progress Notes (Signed)
 Office: [phone]  /  Fax: [phone]  WEIGHT SUMMARY AND BIOMETRICS  Anthropometric Measurements Height: 5' 5 (1.651 m) Weight: 269 lb (122 kg) BMI (Calculated): 44.76 Starting Weight: 269 lb Peak Weight: 300 lb   Body Composition  Body Fat %: 51.5 % Fat Mass (lbs): 139 lbs Muscle Mass (lbs): 124.2 lbs Total Body Water (lbs): 86 lbs Visceral Fat Rating : 18   Other Clinical Data RMR: 1469 Fasting: yes Labs: yes Today's Visit #: 1 Starting Date: 07/12/24    Chief Complaint: OBESITY    History of Present Illness Pamela Esparza is a 58 year old female with obesity, prediabetes, and hypothyroidism who presents for obesity management and workup.  She has struggled with obesity since 2013, following a decrease in exercise and prolonged steroid use. She was previously on Wegovy , which was effective for weight loss, but discontinued it due to increased cost. Her goal is to reach 180 pounds, a weight she has not been since before 2013.  She has a history of prediabetes and is concerned about her 50% chance of developing diabetes, given her family history of diabetes in her grandmother and father. She is interested in addressing this to aid in weight management.  Chronic fatigue is a significant issue. She also experiences dyspnea on exertion, limiting her physical activity. She has a history of hypertension, hyperlipidemia, and vitamin D  deficiency.  Chronic joint pain, particularly in her lower back and legs, limits her ability to exercise. She has undergone nerve conduction and muscle tests, which were normal, and has tried water therapy and land exercises with limited success due to pain.  Her sleep is described as borderline, primarily due to pain issues. She has cravings for chocolate and ice cream, particularly when stressed, which she attributes to her daughter causing stress. She tries to avoid processed foods, and her husband, who does much of the cooking,  prefers frying foods.  She has been sick recently with a virus, experiencing bronchitis, sinus infection, ear infection, and throat issues, but is recovering.  Testing today includes:  Basal Metabolic Rate calculated via Bioimpedance scale 1880 Resting Energy Expenditure measured via Indirect Calorimeter 1469 and is lower  than expected. PHQ-9 score was elevated at 11 Epworth Sleepiness Score was WNL at 8 ECG NSR@61  BPM       PHYSICAL EXAM:  Blood pressure 121/82, pulse 66, temperature 97.8 F (36.6 C), height 5' 5 (1.651 m), weight 269 lb (122 kg), SpO2 98%. Body mass index is 44.76 kg/m.  DIAGNOSTIC DATA REVIEWED:  BMET    Component Value Date/Time   NA 144 04/16/2024 0846   K 4.8 04/16/2024 0846   CL 105 04/16/2024 0846   CO2 25 04/16/2024 0846   GLUCOSE 98 04/16/2024 0846   GLUCOSE 98 10/31/2023 1204   BUN 13 04/16/2024 0846   CREATININE 0.74 04/16/2024 0846   CALCIUM  9.4 04/16/2024 0846   GFRNONAA >60 10/31/2023 1204   GFRAA 110 10/14/2020 1109   Lab Results  Component Value Date   HGBA1C 5.4 04/16/2024   HGBA1C 5.4 03/21/2019   No results found for: INSULIN  Lab Results  Component Value Date   TSH 2.950 04/16/2024   CBC    Component Value Date/Time   WBC 5.2 04/16/2024 0846   WBC 6.1 10/31/2023 1204   RBC 4.24 04/16/2024 0846   RBC 4.40 10/31/2023 1204   HGB 12.8 04/16/2024 0846   HCT 40.2 04/16/2024 0846   PLT 247 04/16/2024 0846   MCV 95 04/16/2024  0846   MCH 30.2 04/16/2024 0846   MCH 32.0 10/31/2023 1204   MCHC 31.8 04/16/2024 0846   MCHC 33.4 10/31/2023 1204   RDW 13.0 04/16/2024 0846   Iron Studies    Component Value Date/Time   FERRITIN 119 12/24/2020 0912   Lipid Panel     Component Value Date/Time   CHOL 241 (H) 04/16/2024 0846   TRIG 108 04/16/2024 0846   HDL 75 04/16/2024 0846   CHOLHDL 3.2 04/16/2024 0846   LDLCALC 147 (H) 04/16/2024 0846   LDLDIRECT 146 (H) 08/14/2019 1155   Hepatic Function Panel     Component  Value Date/Time   PROT 6.4 04/16/2024 0846   ALBUMIN 4.0 04/16/2024 0846   AST 25 04/16/2024 0846   ALT 20 04/16/2024 0846   ALKPHOS 81 04/16/2024 0846   BILITOT 0.4 04/16/2024 0846   BILIDIR 0.12 11/25/2020 1501      Component Value Date/Time   TSH 2.950 04/16/2024 0846   Nutritional Lab Results  Component Value Date   VD25OH 53.6 04/16/2024   VD25OH 65.4 08/18/2023   VD25OH 50.25 09/14/2021     Assessment and Plan Assessment & Plan Obesity Obesity is influenced by genetic and environmental factors, with weight gain since 2013 exacerbated by steroid use. Previous Wegovy  use was effective but discontinued due to cost. She is not considering bariatric surgery due to risks and previous advice against it. The goal is to achieve a weight of 180 pounds, not reached in many years. Emphasis is on dietary adherence as primary treatment, with potential medication use if insurance allows. - Initiate Category 2 eating plan with regular foods and specific choices. - Provide a grocery list for meal planning. - Instruct to use a food scale for appropriate protein intake. - Advise against weighing herself between visits to focus on dietary adherence. - Discuss potential use of medications like Wegovy  if insurance allows.  Prediabetes Prediabetes increases the challenge of weight loss, with a family history of diabetes raising her risk. Addressing prediabetes is crucial to prevent progression to diabetes and facilitate weight loss. She has a 50% chance of developing diabetes due to family history. - Conduct a detailed workup to assess prediabetes status. - Incorporate dietary changes from the Category 2 eating plan.  Hypothyroidism Hypothyroidism can contribute to weight gain and fatigue.  Essential hypertension Hypertension can be influenced by weight and dietary habits. Managing weight and diet can help control blood pressure. - Incorporate dietary changes from the Category 2 eating  plan.  Hyperlipidemia Hyperlipidemia can be influenced by diet and weight. Addressing dietary habits can help manage lipid levels. - Check lipid profile as part of the workup. - Incorporate dietary changes from the Category 2 eating plan.  Vitamin D  deficiency Vitamin D  deficiency can affect overall health and may contribute to fatigue. Addressing this deficiency is important for overall health and weight management. - Check vitamin D  levels as part of the workup.  Fatigue Fatigue may be multifactorial, potentially related to weight, vitamin D  deficiency, or other underlying conditions. It is important to assess and address all potential contributing factors. - Conduct a workup to identify potential causes, including anemia and thyroid  function. - Incorporate dietary changes from the Category 2 eating plan to support energy levels.  Chronic pain of lower back and legs Chronic pain limits physical activity and may be related to previous steroid use and weight. Pain management is crucial to improve mobility and support weight loss efforts. - Encourage low-impact exercises such as  chair yoga or water therapy as tolerated. - Assess for potential inflammation and adjust treatment to minimize steroid use.  Dyspnea on exertion Dyspnea on exertion may be related to deconditioning due to weight and limited physical activity. It is important to monitor and address this symptom as part of the overall weight management plan. - Monitor dyspnea on exertion and assess improvement with lifestyle modifications. - Consider further workup if dyspnea does not improve with weight loss and increased activity.  Follow-Up Follow-up is essential to monitor progress, adjust treatment plans, and address any new concerns. - Schedule follow-up visit in two weeks to review test results and assess progress with the eating plan.     I personally spent a total of 50 minutes in the care of the patient today including  preparing to see the patient, reviewing separately obtained history, performing a medically appropriate evaluation of current problems, placing orders in the EMR, documenting clinical information in the EMR, customized nutritional counseling for their specific health and social needs, independently interpreting results, and explaining the pathophysiology of obesity and how it is significantly more complex than eat less and exercise more.    Sheray was informed of the importance of frequent follow up visits to maximize her success with intensive lifestyle modifications for her obesity and obesity related health conditions as recommended by USPSTF and CMS guidelines   Louann Penton, MD

## 2024-07-13 LAB — CMP14+EGFR
ALT: 21 IU/L (ref 0–32)
AST: 25 IU/L (ref 0–40)
Albumin: 4.3 g/dL (ref 3.8–4.9)
Alkaline Phosphatase: 94 IU/L (ref 49–135)
BUN/Creatinine Ratio: 15 (ref 9–23)
BUN: 12 mg/dL (ref 6–24)
Bilirubin Total: 0.3 mg/dL (ref 0.0–1.2)
CO2: 22 mmol/L (ref 20–29)
Calcium: 9.8 mg/dL (ref 8.7–10.2)
Chloride: 100 mmol/L (ref 96–106)
Creatinine, Ser: 0.82 mg/dL (ref 0.57–1.00)
Globulin, Total: 2.8 g/dL (ref 1.5–4.5)
Glucose: 96 mg/dL (ref 70–99)
Potassium: 4.4 mmol/L (ref 3.5–5.2)
Sodium: 140 mmol/L (ref 134–144)
Total Protein: 7.1 g/dL (ref 6.0–8.5)
eGFR: 82 mL/min/1.73 (ref >59)

## 2024-07-13 LAB — PHOSPHORUS: Phosphorus: 4 mg/dL (ref 3.0–4.3)

## 2024-07-13 LAB — CBC WITH DIFFERENTIAL/PLATELET
Basophils Absolute: 0.1 x10E3/uL (ref 0.0–0.2)
Basos: 1 %
EOS (ABSOLUTE): 0.2 x10E3/uL (ref 0.0–0.4)
Eos: 3 %
Hematocrit: 44 % (ref 34.0–46.6)
Hemoglobin: 14.2 g/dL (ref 11.1–15.9)
Immature Grans (Abs): 0.1 x10E3/uL (ref 0.0–0.1)
Immature Granulocytes: 1 %
Lymphocytes Absolute: 2.7 x10E3/uL (ref 0.7–3.1)
Lymphs: 39 %
MCH: 30.7 pg (ref 26.6–33.0)
MCHC: 32.3 g/dL (ref 31.5–35.7)
MCV: 95 fL (ref 79–97)
Monocytes Absolute: 0.5 x10E3/uL (ref 0.1–0.9)
Monocytes: 7 %
Neutrophils Absolute: 3.4 x10E3/uL (ref 1.4–7.0)
Neutrophils: 49 %
Platelets: 306 x10E3/uL (ref 150–450)
RBC: 4.63 x10E6/uL (ref 3.77–5.28)
RDW: 13.1 % (ref 11.7–15.4)
WBC: 7 x10E3/uL (ref 3.4–10.8)

## 2024-07-13 LAB — LIPID PANEL+APOB
Apolipoprotein B: 141 mg/dL — ABNORMAL HIGH (ref <90)
Cholesterol, Total: 297 mg/dL — ABNORMAL HIGH (ref 100–199)
HDL-C: 79 mg/dL (ref >39)
LDL-C (NIH Calc): 197 mg/dL — ABNORMAL HIGH (ref 0–99)
Non-HDL Cholesterol: 218 mg/dL — ABNORMAL HIGH (ref 0–129)
Triglycerides: 122 mg/dL (ref 0–149)

## 2024-07-13 LAB — IRON AND TIBC
Iron Saturation: 29 % (ref 15–55)
Iron: 82 ug/dL (ref 27–159)
Total Iron Binding Capacity: 281 ug/dL (ref 250–450)
UIBC: 199 ug/dL (ref 131–425)

## 2024-07-13 LAB — MAGNESIUM: Magnesium: 2.1 mg/dL (ref 1.6–2.3)

## 2024-07-13 LAB — TSH: TSH: 2.54 u[IU]/mL (ref 0.450–4.500)

## 2024-07-13 LAB — VITAMIN B12: Vitamin B-12: 1189 pg/mL (ref 232–1245)

## 2024-07-13 LAB — VITAMIN D 25 HYDROXY (VIT D DEFICIENCY, FRACTURES): Vit D, 25-Hydroxy: 40 ng/mL (ref 30.0–100.0)

## 2024-07-13 LAB — FERRITIN: Ferritin: 113 ng/mL (ref 15–150)

## 2024-07-13 LAB — HEMOGLOBIN A1C
Est. average glucose Bld gHb Est-mCnc: 111 mg/dL
Hgb A1c MFr Bld: 5.5 % (ref 4.8–5.6)

## 2024-07-13 LAB — FOLATE: Folate: >20 ng/mL (ref >3.0)

## 2024-07-13 LAB — INSULIN, RANDOM: INSULIN: 26.1 u[IU]/mL — ABNORMAL HIGH (ref 2.6–24.9)

## 2024-07-16 ENCOUNTER — Ambulatory Visit (INDEPENDENT_AMBULATORY_CARE_PROVIDER_SITE_OTHER): Payer: Self-pay | Admitting: Family Medicine

## 2024-07-18 ENCOUNTER — Ambulatory Visit (INDEPENDENT_AMBULATORY_CARE_PROVIDER_SITE_OTHER): Admitting: Nurse Practitioner

## 2024-07-19 ENCOUNTER — Other Ambulatory Visit (HOSPITAL_COMMUNITY): Payer: Self-pay

## 2024-07-19 MED ORDER — VERAPAMIL HCL ER 120 MG PO TBCR
120.0000 mg | EXTENDED_RELEASE_TABLET | Freq: Every day | ORAL | 11 refills | Status: DC
  Filled 2024-07-19: qty 30, 30d supply, fill #0

## 2024-07-20 ENCOUNTER — Encounter: Payer: Self-pay | Admitting: Physical Medicine and Rehabilitation

## 2024-07-20 ENCOUNTER — Encounter: Attending: Physical Medicine and Rehabilitation | Admitting: Physical Medicine and Rehabilitation

## 2024-07-20 VITALS — BP 133/76 | HR 73 | Ht 65.0 in | Wt 271.0 lb

## 2024-07-20 DIAGNOSIS — M5416 Radiculopathy, lumbar region: Secondary | ICD-10-CM | POA: Diagnosis present

## 2024-07-20 DIAGNOSIS — G8929 Other chronic pain: Secondary | ICD-10-CM | POA: Insufficient documentation

## 2024-07-20 DIAGNOSIS — M47819 Spondylosis without myelopathy or radiculopathy, site unspecified: Secondary | ICD-10-CM | POA: Diagnosis not present

## 2024-07-20 DIAGNOSIS — G4701 Insomnia due to medical condition: Secondary | ICD-10-CM | POA: Insufficient documentation

## 2024-07-20 DIAGNOSIS — M546 Pain in thoracic spine: Secondary | ICD-10-CM | POA: Insufficient documentation

## 2024-07-20 DIAGNOSIS — G894 Chronic pain syndrome: Secondary | ICD-10-CM | POA: Diagnosis present

## 2024-07-20 DIAGNOSIS — M47816 Spondylosis without myelopathy or radiculopathy, lumbar region: Secondary | ICD-10-CM | POA: Diagnosis present

## 2024-07-20 DIAGNOSIS — M797 Fibromyalgia: Secondary | ICD-10-CM | POA: Insufficient documentation

## 2024-07-20 MED ORDER — METFORMIN HCL 500 MG PO TABS: 500.0000 mg | ORAL_TABLET | Freq: Every day | ORAL | 3 refills | Status: AC

## 2024-07-20 MED ORDER — OXYCODONE-ACETAMINOPHEN 10-325 MG PO TABS
1.0000 | ORAL_TABLET | Freq: Two times a day (BID) | ORAL | 0 refills | Status: DC | PRN
Start: 2024-07-20 — End: 2024-08-20

## 2024-07-20 MED ORDER — TRAZODONE HCL 50 MG PO TABS: 50.0000 mg | ORAL_TABLET | Freq: Every day | ORAL | 3 refills | Status: DC

## 2024-07-20 NOTE — Progress Notes (Signed)
 Subjective:    Patient ID: Pamela Esparza, female    DOB: Mar 26, 1966, 58 y.o.   MRN: [id number]  HPI Mrs. Melkonian is a 58 year old woman who presents to establish care for leg pain  1) Leg pain: -scheduled for steroid injection -radiates from the back -has leg swelling  -she has been diagnosed with sciatica and herniated disc -she gets steroid injections in [facility] and these help with the back pain -this has happened since 2012 -doing PT -she has a tens unit at home  2) Insomnia: -she takes flexeril and this helps her sleep -she was on amitriptyline  3) Nausea: -started with LDN and stopped after she stopped the medicine  4) Cervical spine pain: -not sure if Dr. Bonner has called her -she has pain in both shoulders -she is interested in cervical spine injections -radiates into her arms -she has had prior cervical spine surgery -when she had surgery she was dropping objects -she has tried postural corrections but has muscle spasm She is interested in cervical ESI  5) Low back pain: -she used to get ablations that were helpful for her  6) Fibromyalgia: -she is on cymbalta 60mg  daily -she cannot afford LDN -she did aquatherapy and it helped a little bit but its effects wore off -she cannot to do land therapy because she is in a lot pain afterward  Pain Inventory Average Pain 7 Pain Right Now 7 My pain is constant, sharp, burning, dull, stabbing, tingling, and aching  In the last 24 hours, has pain interfered with the following? General activity 8 Relation with others 8 Enjoyment of life 8 What TIME of day is your pain at its worst? morning , daytime, evening, night, and varies Sleep (in general) Poor  Pain is worse with: walking, bending, sitting, inactivity, standing, and some activites Pain improves with: rest, heat/ice, medication, and injections Relief from Meds: 9      Family History  Problem Relation Age of Onset   Breast cancer Mother 20        second cancer at 5   Lung cancer Mother 79   Arthritis Mother    Cancer Mother    COPD Mother    Heart attack Father    Hypertension Father    Diabetes Father    Bladder Cancer Father 59   Arthritis Father    Asthma Father    Cancer Father    COPD Father    Heart disease Father    Stroke Father    Vision loss Father    Lung cancer Maternal Aunt    Cancer Maternal Aunt    Brain cancer Paternal Uncle    Thyroid  disease Paternal Uncle    Birth defects Maternal Grandmother        ovarian   Depression Maternal Grandmother    Uterine cancer Maternal Grandmother        d. 19s   Cancer Maternal Grandmother    Obesity Maternal Grandmother    Brain cancer Paternal Grandmother    Cancer Paternal Grandmother    Obesity Paternal Grandmother    ADD / ADHD Son    Cancer Paternal Uncle    Early death Paternal Uncle    Obesity Paternal Uncle    Asthma Daughter    Miscarriages / [place] Daughter    Colon cancer Neg Hx    Colon polyps Neg Hx    Esophageal cancer Neg Hx    Stomach cancer Neg Hx    Rectal cancer  Neg Hx    Social History   Socioeconomic History   Marital status: Married    Spouse name: Vyolet Sakuma   Number of children: Not on file   Years of education: Not on file   Highest education level: Associate degree: occupational, technical, or vocational program  Occupational History   Occupation: Retired, disabled  Tobacco Use   Smoking status: Never    Passive exposure: Never   Smokeless tobacco: Never   Tobacco comments:    Never smoked  Vaping Use   Vaping status: Never Used  Substance and Sexual Activity   Alcohol use: Not Currently    Comment: 4 drinks a year-rare   Drug use: Never   Sexual activity: Not Currently    Comment: Total hysterectomy  Other Topics Concern   Not on file  Social History Narrative   Not on file   Social Drivers of Health   Financial Resource Strain: Low Risk  (04/23/2024)   Overall Financial Resource Strain  (CARDIA)    Difficulty of Paying Living Expenses: Not very hard  Food Insecurity: No Food Insecurity (04/23/2024)   Hunger Vital Sign    Worried About Running Out of Food in the Last Year: Never true    Ran Out of Food in the Last Year: Never true  Transportation Needs: No Transportation Needs (04/23/2024)   PRAPARE - Administrator, Civil Service (Medical): No    Lack of Transportation (Non-Medical): No  Physical Activity: Inactive (04/23/2024)   Exercise Vital Sign    Days of Exercise per Week: 0 days    Minutes of Exercise per Session: Not on file  Stress: No Stress Concern Present (04/23/2024)   [employer] of Occupational Health - Occupational Stress Questionnaire    Feeling of Stress: Only a little  Social Connections: Moderately Isolated (04/23/2024)   Social Connection and Isolation Panel    Frequency of Communication with Friends and Family: More than three times a week    Frequency of Social Gatherings with Friends and Family: Patient declined    Attends Religious Services: Patient declined    Database administrator or Organizations: No    Attends Engineer, structural: Not on file    Marital Status: Married   Past Surgical History:  Procedure Laterality Date   ANKLE SURGERY Right    APPENDECTOMY     CHOLECYSTECTOMY     ESOPHAGOGASTRODUODENOSCOPY (EGD) WITH PROPOFOL  N/A 12/05/2020   Procedure: ESOPHAGOGASTRODUODENOSCOPY (EGD) WITH PROPOFOL ;  Surgeon: Janalyn Keene NOVAK, MD;  Location: [facility];  Service: Endoscopy;  Laterality: N/A;   finger sugery     NECK SURGERY     SPINE SURGERY     On my neck   TENDON TRANSFER Right 11/06/2019   Procedure: PERONEAL RECONSTRUCTION WITH PERONEAL LONGUS RIGHT LEG;  Surgeon: Harden Jerona GAILS, MD;  Location: [facility];  Service: Orthopedics;  Laterality: Right;   TOTAL ABDOMINAL HYSTERECTOMY     TUBAL LIGATION N/A    Phreesia 10/13/2020   Past Medical History:  Diagnosis Date   Allergy     Topiramate, Lyrica , aspirin (sensitive to)   Anemia    Anxiety    Anxiety    Arthritis    At around 21yrs   Asthma    Back pain    Cataract    Cataracts, both eyes    Chest pain    Chronic fatigue syndrome    Chronic pain    Constipation    DDD (  degenerative disc disease), lumbar    Depression    Depression    Phreesia 10/13/2020   Depression    Edema of both lower extremities    Esophageal dysmotility    mild to moderate   Family history of bladder cancer    Family history of brain cancer 02/15/2023   Fatty liver    Fatty liver    Fibromyalgia    Gallbladder problem    High blood pressure    Hyperlipidemia    Hypothyroidism    IBS (irritable bowel syndrome)    Infertility, female    Joint pain    Joint pain    Lactose intolerance    Lumbar arthropathy    Mini stroke    Neuromuscular disorder (HCC) 2013   Phreesia 10/13/2020   Neuropathy    Osteoporosis    Prediabetes    Sciatica    Sjogren's disease    SOBOE (shortness of breath on exertion)    SOBOE (shortness of breath on exertion)    Spasm of esophagus    Swallowing difficulty    Thyroid  disease    Vitamin D  deficiency    BP 133/76   Pulse 73   Ht 5' 5 (1.651 m)   Wt 271 lb (122.9 kg)   SpO2 96%   BMI 45.10 kg/m   Opioid Risk Score:   Fall Risk Score:  `1  Depression screen [facility] 2/9     07/20/2024   11:43 AM 07/12/2024    7:32 AM 04/27/2024   10:32 AM 03/06/2024    1:12 PM 01/27/2024   10:06 AM 12/16/2023    8:53 AM 11/24/2023    9:54 AM  Depression screen PHQ 2/9  Decreased Interest 3 3 2  0 0 0 0  Down, Depressed, Hopeless 3 0 0 0 0 0 0  PHQ - 2 Score 6 3 2  0 0 0 0  Altered sleeping  3 2 0   0  Tired, decreased energy  3 1 0   0  Change in appetite  1 0 0   0  Feeling bad or failure about yourself   0 0 0   0  Trouble concentrating  1 1 0   0  Moving slowly or fidgety/restless  0 0 0   0  Suicidal thoughts  0 0 0   0  PHQ-9 Score  11 6 0   0  Difficult doing work/chores  Somewhat  difficult Somewhat difficult        Review of Systems  Gastrointestinal:  Positive for constipation.  Genitourinary:  Positive for frequency.  Musculoskeletal:  Positive for gait problem.       Pain all over the body  Neurological:  Positive for weakness and numbness.  Psychiatric/Behavioral:  Positive for confusion.        Depression, anxiety  All other systems reviewed and are negative.      Objective:   Physical Exam Gen: no distress, normal appearing HEENT: oral mucosa pink and moist, NCAT Cardio: Reg rate Chest: normal effort, normal rate of breathing Abd: soft, non-distended Ext: no edema Psych: pleasant, normal affect Skin: intact Neuro: Alert and oriented x3, 4/5 strength upper extremities, stable     Assessment & Plan:   1) Chronic Pain Syndrome secondary to leg pain: -discussed that she had EMG/[place] testing and this was told to her to be normal -discussed that she used to be strong -discussed plan for steroid injection with Dr. Carilyn -discussed that she avoids  processed foods -Discussed current symptoms of pain and history of pain.  -Discussed benefits of exercise in reducing pain.  -continue voltaren  -discussed mechanism of action of low dose naltrexone as an opioid receptor antagonist which stimulates your body's production of its own natural endogenous opioids, helping to decrease pain. Discussed that it can also decrease T cell response and thus be helpful in decreasing inflammation, and symptoms of brain fog, fatigue, anxiety, depression, and allergies. Discussed that this medication needs to be compounded at a compounding pharmacy and can more expensive. Discussed that I usually start at 1mg  and if this is not providing enough relief then I titrate upward on a monthly basis.    -discussed that Journavx is a highly selective inhibitor for Nav 1.8, which is specific for pain in the peripheral nervous system, discussed that lidocaine  in contrast affects all  Nav receptors, discussed that patient can try using this medication as prn for pain, though its studies have focused on its use for acute pain. Discussed that it has been studied against opioids for acute pain with comparable efficacy. Discussed that I have not seen any patient side effects thus far. Discussed that we have samples available and we have copay cards available. Discussed that outpatient if the medication requires a prior auth the copay should be $30 for at least a 60 day supply. The medication may be more likely to be in stock in [facility] and [facility]. We do have samples available.   -Discussed current symptoms of pain and history of pain.  -Discussed benefits of exercise in reducing pain. -Discussed following foods that may reduce pain: 1) Ginger (especially studied for arthritis)- reduce leukotriene production to decrease inflammation 2) Blueberries- high in phytonutrients that decrease inflammation 3) Salmon- marine omega-3s reduce joint swelling and pain 4) Pumpkin seeds- reduce inflammation 5) dark chocolate- reduces inflammation 6) turmeric- reduces inflammation 7) tart cherries - reduce pain and stiffness 8) extra virgin olive oil - its compound olecanthal helps to block prostaglandins  9) chili peppers- can be eaten or applied topically via capsaicin 10) mint- helpful for headache, muscle aches, joint pain, and itching 11) garlic- reduces inflammation 12) Green tea- reduces inflammation and oxidative stress, helps with weight loss, may reduce the risk of cancer, recommend Double Green Matcha [place] of Tea daily  Link to further information on diet for chronic pain: http://www.bray.com/    2) Insomnia: -trazodone 50mg  prescribed HS -Try to go outside near sunrise -Get exercise during the day.  -Turn off all devices an hour before bedtime.  -Teas that can benefit: chamomile, valerian root, Brahmi  (Bacopa) -Can consider over the counter melatonin, magnesium, and/or L-theanine. Melatonin is an anti-oxidant with multiple health benefits. Magnesium is involved in greater than 300 enzymatic reactions in the body and most of us  are deficient as our soil is often depleted. There are 7 different types of magnesium- Bioptemizer's is a supplement with all 7 types, and each has unique benefits. Magnesium can also help with constipation and anxiety.  -Pistachios naturally increase the production of melatonin -Cozy Earth bamboo bed sheets are free from toxic chemicals.  -Tart cherry juice or a tart cherry supplement can improve sleep and soreness post-workout    Link to further information on diet for chronic pain: http://www.bray.com/    3) Facet arthropathy: -discussed that she has had medial branch blocks -referred to Dr. Carilyn for medial branch blocks  4) Constipation:  -Provided list of following foods that help with constipation and highlighted a few:  1) prunes- contain high amounts of fiber.  2) apples- has a form of dietary fiber called pectin that accelerates stool movement and increases beneficial gut bacteria 3) pears- in addition to fiber, also high in fructose and sorbitol which have laxative effect 4) figs- contain an enzyme ficin which helps to speed colonic transit 5) kiwis- contain an enzyme actinidin that improves gut motility and reduces constipation 6) oranges- rich in pectin (like apples) 7) grapefruits- contain a flavanol naringenin which has a laxative effect 8) vegetables- rich in fiber and also great sources of folate, vitamin C, and K 9) artichoke- high in inulin, prebiotic great for the microbiome 10) chicory- increases stool frequency and softness (can be added to coffee) 11) rhubarb- laxative effect 12) sweet potato- high fiber 13) beans, peas, and lentils- contain both soluble and insoluble  fiber 14) chia seeds- improves intestinal health and gut flora 15) flaxseeds- laxative effect 16) whole grain rye bread- high in fiber 17) oat bran- high in soluble and insoluble fiber 18) kefir- softens stools -recommended to try at least one of these foods every day.  -drink 6-8 glasses of water per day -walk regularly, especially after meals.   5) Shortness of breath: -discussed that she had a thorough eval and her heart is working well  6) Cervical spine pain:  -discussed her cervical radiculopathy, discussed getting an MRI cervical spine, discussed that she has been receiving 10mg  Percocet from another provider but that they have moved far away and so she would like to schedule with us , discussed that we can probably get her in to see our nurse practitioner Fidela Ned next week and that we would have to do pain contract and urine sample at this appointment  -referred to Dr. Charlie Dolores for cervical ESI, placed new referral since she has not heard from their office  -discussed that Journavx is a highly selective inhibitor for Nav 1.8, which is specific for pain in the peripheral nervous system, discussed that lidocaine  in contrast affects all Nav receptors, discussed that patient can try using this medication as prn for pain, though its studies have focused on its use for acute pain. Discussed that it has been studied against opioids for acute pain with comparable efficacy. Discussed that I have not seen any patient side effects thus far. Discussed that we have samples available and we have copay cards available. Discussed that outpatient if the medication requires a prior auth the copay should be $30 for at least a 60 day supply. The medication may be more likely to be in stock in [facility] and [facility]. We do have samples available.   -Discussed Qutenza as an option for neuropathic pain control. Discussed that this is a capsaicin patch, stronger than capsaicin cream. Discussed that it is  currently approved for diabetic peripheral neuropathy and post-herpetic neuralgia, but that it has also shown benefit in treating other forms of neuropathy. Provided patient with link to site to learn more about the patch: https://www.clark.biz/. Discussed that the patch would be placed in office and benefits usually last 3 months. Discussed that unintended exposure to capsaicin can cause severe irritation of eyes, mucous membranes, respiratory tract, and skin, but that Qutenza is a local treatment and does not have the systemic side effects of other nerve medications. Discussed that there may be pain, itching, erythema, and decreased sensory function associated with the application of Qutenza. Side effects usually subside within 1 week. A cold pack of analgesic medications can help with these side effects.  Blood pressure can also be increased due to pain associated with administration of the patch.   7) Neuropathy: -continue voltaren  -Discussed Qutenza as an option for neuropathic pain control. Discussed that this is a capsaicin patch, stronger than capsaicin cream. Discussed that it is currently approved for diabetic peripheral neuropathy and post-herpetic neuralgia, but that it has also shown benefit in treating other forms of neuropathy. Provided patient with link to site to learn more about the patch: https://www.clark.biz/. Discussed that the patch would be placed in office and benefits usually last 3 months. Discussed that unintended exposure to capsaicin can cause severe irritation of eyes, mucous membranes, respiratory tract, and skin, but that Qutenza is a local treatment and does not have the systemic side effects of other nerve medications. Discussed that there may be pain, itching, erythema, and decreased sensory function associated with the application of Qutenza. Side effects usually subside within 1 week. A cold pack of analgesic medications can help with these side effects. Blood pressure  can also be increased due to pain associated with administration of the patch.  We are recommending Qutenza 8% capsaicin to treat this patient's pain. Qutenza is the first-line treatment option recommended for diabetic peripheral neuropathy by the AACE and ADA.   Qutenza is a safer option for this patient due to the following: Gabapentin  use has been associated with increased risk of dementia Lyrica  use has been associated with increased risk of heart failure Cymblata, Venlafaxine, and other SSRIs/SNRIs are associated with increased weight gain Lidocaine  5% has been prescribed/tried   -frankincense and myrrh  8) Lumbar radiculopathy:  -discussed avoiding processed foods  -Discussed Qutenza as an option for neuropathic pain control. Discussed that this is a capsaicin patch, stronger than capsaicin cream. Discussed that it is currently approved for diabetic peripheral neuropathy and post-herpetic neuralgia, but that it has also shown benefit in treating other forms of neuropathy. Provided patient with link to site to learn more about the patch: https://www.clark.biz/. Discussed that the patch would be placed in office and benefits usually last 3 months. Discussed that unintended exposure to capsaicin can cause severe irritation of eyes, mucous membranes, respiratory tract, and skin, but that Qutenza is a local treatment and does not have the systemic side effects of other nerve medications. Discussed that there may be pain, itching, erythema, and decreased sensory function associated with the application of Qutenza. Side effects usually subside within 1 week. A cold pack of analgesic medications can help with these side effects. Blood pressure can also be increased due to pain associated with administration of the patch.  We are recommending Qutenza 8% capsaicin to treat this patient's pain. Qutenza is the first-line treatment option recommended for diabetic peripheral neuropathy by the AACE and  ADA.   Qutenza is a safer option for this patient due to the following: Gabapentin  use has been associated with increased risk of dementia Lyrica  use has been associated with increased risk of heart failure Cymblata, Venlafaxine, and other SSRIs/SNRIs are associated with increased weight gain Lidocaine  5% has been prescribed/tried   -referred to Dr. Carilyn for steroid injection  9) Obesity: -metformin 500mg  daily

## 2024-07-25 NOTE — Progress Notes (Unsigned)
   Subjective:    Patient ID: Pamela Esparza, female    DOB: 08-03-1966, 58 y.o.   MRN: [id number]  HPI   .cpr PROCEDURE RECORD [facility]   Name: Pamela Esparza DOB:09-01-1966 MRN: [id number]  Date:07/25/2024  Physician: Prentice Compton MD    Nurse/CMA: Tye Kitty  Allergies:  Allergies  Allergen Reactions   Aspirin Other (See Comments)    stomach upset stomach upset    Hydrocodone-Acetaminophen  Other (See Comments)    made her sick made her sick    Azithromycin      Patient states it does not work for her   Chlorhexidine      Pt reports possible chlorarpep allergy   Codeine     Other reaction(s): Upset stomach (finding)   Lyrica  [Pregabalin ]    Solu-Medrol  [Methylprednisolone ] Itching   Topiramate    Wasp Venom     Consent Signed: {yes no:314532}  Is patient diabetic? {yes no:314532}  CBG today? ***  Pregnant: {yes no:314532} LMP: No LMP recorded. Patient has had a hysterectomy. (age [age])  Anticoagulants: {Yes/No:19989} Anti-inflammatory: {Yes/No:19989} Antibiotics: {Yes/No:19989}  Procedure: Bilateral L3-L4 MMB L5 dorsal RF  Position: Prone Start Time: ***  End Time: ***  Fluoro Time: ***  RN/CMA      Time      BP      Pulse      Respirations      O2 Sat      S/S      Pain Level       D/C home with ***, patient A & O X 3, D/C instructions reviewed, and sits independently.        Review of Systems     Objective:   Physical Exam        Assessment & Plan:

## 2024-07-26 ENCOUNTER — Encounter (INDEPENDENT_AMBULATORY_CARE_PROVIDER_SITE_OTHER): Payer: Self-pay | Admitting: Family Medicine

## 2024-07-26 ENCOUNTER — Ambulatory Visit (INDEPENDENT_AMBULATORY_CARE_PROVIDER_SITE_OTHER): Admitting: Family Medicine

## 2024-07-26 VITALS — BP 124/79 | HR 79 | Temp 98.1°F | Ht 65.0 in | Wt 265.0 lb

## 2024-07-26 DIAGNOSIS — E669 Obesity, unspecified: Secondary | ICD-10-CM

## 2024-07-26 DIAGNOSIS — E782 Mixed hyperlipidemia: Secondary | ICD-10-CM | POA: Diagnosis not present

## 2024-07-26 DIAGNOSIS — E88819 Insulin resistance, unspecified: Secondary | ICD-10-CM | POA: Diagnosis not present

## 2024-07-26 DIAGNOSIS — E559 Vitamin D deficiency, unspecified: Secondary | ICD-10-CM

## 2024-07-26 DIAGNOSIS — Z6841 Body Mass Index (BMI) 40.0 and over, adult: Secondary | ICD-10-CM

## 2024-07-26 DIAGNOSIS — M797 Fibromyalgia: Secondary | ICD-10-CM

## 2024-07-26 NOTE — Progress Notes (Signed)
 Office: [phone]  /  Fax: [phone]  WEIGHT SUMMARY AND BIOMETRICS  Anthropometric Measurements Height: 5' 5 (1.651 m) Weight: 265 lb (120.2 kg) BMI (Calculated): 44.1 Weight at Last Visit: 269 lb Weight Lost Since Last Visit: 4 lb Weight Gained Since Last Visit: 0 Starting Weight: 269 lb Total Weight Loss (lbs): 4 lb (1.814 kg) Peak Weight: 300 lb Waist Measurement : 49 inches   Body Composition  Body Fat %: 51.4 % Fat Mass (lbs): 136.6 lbs Muscle Mass (lbs): 122.6 lbs Total Body Water (lbs): 85.6 lbs Visceral Fat Rating : 18   Other Clinical Data RMR: 1469 Fasting: no Labs: no Today's Visit #: 2 Starting Date: 07/12/24    Chief Complaint: OBESITY  History of Present Illness Pamela Esparza is a 58 year old female with obesity who presents for follow-up on her weight management plan and test results.  She is adhering to a category two eating plan for her obesity 90% of the time and has lost four pounds in the last two weeks. She experiences difficulty with certain foods in the plan, such as lunch meat and eggs, but finds Greek yogurt acceptable. She sometimes consumes only half the recommended portion of lunch meat due to taste aversion. She uses Healthy Choice meals to meet her protein requirements, although she does not reach the six to eight ounces of protein for dinner as recommended.  Her hunger levels are manageable, and she enjoys Yassos as a treat, saving 200 calories for her. She drinks water regularly and opts for unsweetened tea when dining out. Her snacks typically consist of around 200 calories, often including blueberries or yogurt. She tries to avoid eating at night unless she is hungry.  Her fibromyalgia is flaring, impacting her ability to cook and causing fatigue. Her husband is unable to assist with cooking due to work demands. She experiences significant fatigue, sometimes only getting four hours of sleep, and feels exhausted upon  waking.  Recent lab results indicate a total cholesterol of 197 and an apolipoprotein B level above the normal range. She was previously on atorvastatin  but discontinued it when her cholesterol levels normalized. She attributes the recent increase to eating out more frequently. Her family history includes her father having multiple heart attacks.  Her vitamin D  level has decreased from 53 to 40, despite taking 5000 IU of vitamin D3 daily. Her B12 levels are good, and her thyroid  function is normal.  Her fasting glucose is normal at 96, and her hemoglobin A1c is 5.5, indicating good blood sugar control. However, her fasting insulin  level is elevated at 26.1. She has a family history of diabetes, with her father and grandmother affected. She avoids bread and starchy foods to manage her risk.  Her resting metabolic rate is measured at 1469 calories. She attributes this partly to her inability to exercise due to fibromyalgia. She prefers vegetables over meat, which may contribute to her lower protein intake.      PHYSICAL EXAM:  Blood pressure 124/79, pulse 79, temperature 98.1 F (36.7 C), height 5' 5 (1.651 m), weight 265 lb (120.2 kg), SpO2 98%. Body mass index is 44.1 kg/m.  DIAGNOSTIC DATA REVIEWED:  BMET    Component Value Date/Time   NA 140 07/12/2024 1030   K 4.4 07/12/2024 1030   CL 100 07/12/2024 1030   CO2 22 07/12/2024 1030   GLUCOSE 96 07/12/2024 1030   GLUCOSE 98 10/31/2023 1204   BUN 12 07/12/2024 1030   CREATININE 0.82 07/12/2024 1030  CALCIUM  9.8 07/12/2024 1030   GFRNONAA >60 10/31/2023 1204   GFRAA 110 10/14/2020 1109   Lab Results  Component Value Date   HGBA1C 5.5 07/12/2024   HGBA1C 5.4 03/21/2019   Lab Results  Component Value Date   INSULIN  26.1 (H) 07/12/2024   Lab Results  Component Value Date   TSH 2.540 07/12/2024   CBC    Component Value Date/Time   WBC 7.0 07/12/2024 1030   WBC 6.1 10/31/2023 1204   RBC 4.63 07/12/2024 1030   RBC 4.40  10/31/2023 1204   HGB 14.2 07/12/2024 1030   HCT 44.0 07/12/2024 1030   PLT 306 07/12/2024 1030   MCV 95 07/12/2024 1030   MCH 30.7 07/12/2024 1030   MCH 32.0 10/31/2023 1204   MCHC 32.3 07/12/2024 1030   MCHC 33.4 10/31/2023 1204   RDW 13.1 07/12/2024 1030   Iron Studies    Component Value Date/Time   IRON 82 07/12/2024 1030   TIBC 281 07/12/2024 1030   FERRITIN 113 07/12/2024 1030   IRONPCTSAT 29 07/12/2024 1030   Lipid Panel     Component Value Date/Time   CHOL 241 (H) 04/16/2024 0846   TRIG 108 04/16/2024 0846   HDL 75 04/16/2024 0846   CHOLHDL 3.2 04/16/2024 0846   LDLCALC 147 (H) 04/16/2024 0846   LDLDIRECT 146 (H) 08/14/2019 1155   Hepatic Function Panel     Component Value Date/Time   PROT 7.1 07/12/2024 1030   ALBUMIN 4.3 07/12/2024 1030   AST 25 07/12/2024 1030   ALT 21 07/12/2024 1030   ALKPHOS 94 07/12/2024 1030   BILITOT 0.3 07/12/2024 1030   BILIDIR 0.12 11/25/2020 1501      Component Value Date/Time   TSH 2.540 07/12/2024 1030   Nutritional Lab Results  Component Value Date   VD25OH 40.0 07/12/2024   VD25OH 53.6 04/16/2024   VD25OH 65.4 08/18/2023     Assessment and Plan Assessment & Plan Obesity with insulin  resistance and prediabetes Insulin  level is 26.1, indicating insulin  resistance, which is stage one of the cascade leading to diabetes. The pancreas is overworking to maintain normal blood glucose levels, increasing the risk of diabetes and weight gain. Current eating plan is challenging due to protein intake and personal food preferences. Metabolic rate is lower than expected, contributing to weight management difficulties. - Change eating plan to allow more choices and control, focusing on 1200-1300 calories per day with 90 or more grams of protein. - Provide recipes and strategies for easy meal preparation, especially during fibromyalgia flares. - Consider protein supplements to meet protein intake goals. - Educate on the importance  of protein and the ten to one ratio for food choices. - Discuss potential use of medications for insulin  resistance if dietary changes are insufficient.  Mixed hyperlipidemia LDL cholesterol at 197, significantly above the target of less than 100, increasing cardiovascular risk. Apolipoprotein B is also elevated, indicating a higher risk of heart disease. Previous atorvastatin  use was effective, but cholesterol has increased, likely due to dietary factors. - Recheck cholesterol levels in three months. - Emphasize dietary modifications to improve cholesterol levels, including reducing fried foods and eating out. - Consider more aggressive treatment if cholesterol levels do not improve.  Vitamin D  deficiency Vitamin D  level has decreased from 53 to 40, below the desired range of 50-60. Currently taking 5000 IU of vitamin D3, which may need adjustment if levels do not improve. - Continue current vitamin D3 supplementation. - Reassess vitamin D  levels in  three months and adjust dosage if necessary.     I personally spent a total of 43 minutes in the care of the patient today including preparing to see the patient, performing a medically appropriate evaluation of current problems, placing orders in the EMR, documenting clinical information in the EMR, customized nutritional counseling for their specific health and social needs, independently interpreting results, discussing results with the patient and educating them on how these results can affect their health and weight, explaining the pathophysiology of obesity and how it is significantly more complex than eat less and exercise more, and education on how to keep an appropriate food journal with and without technology.  Isyss was counseled on the importance of maintaining healthy lifestyle habits, including balanced nutrition, regular physical activity, and behavioral modifications, while taking antiobesity medication.  Patient verbalized  understanding that medication is an adjunct to, not a replacement for, lifestyle changes and that the long-term success and weight maintenance depend on continued adherence to these strategies.   Janila was informed of the importance of frequent follow up visits to maximize her success with intensive lifestyle modifications for her obesity and obesity related health conditions as recommended by USPSTF and CMS guidelines   Louann Penton, MD

## 2024-07-27 ENCOUNTER — Encounter: Payer: Self-pay | Admitting: Physical Medicine & Rehabilitation

## 2024-07-27 ENCOUNTER — Encounter (HOSPITAL_BASED_OUTPATIENT_CLINIC_OR_DEPARTMENT_OTHER): Admitting: Physical Medicine & Rehabilitation

## 2024-07-27 VITALS — BP 116/82 | HR 75 | Ht 65.0 in | Wt 269.0 lb

## 2024-07-27 DIAGNOSIS — M47816 Spondylosis without myelopathy or radiculopathy, lumbar region: Secondary | ICD-10-CM | POA: Diagnosis not present

## 2024-07-27 DIAGNOSIS — M5416 Radiculopathy, lumbar region: Secondary | ICD-10-CM | POA: Diagnosis not present

## 2024-07-27 MED ORDER — LIDOCAINE HCL 1 % IJ SOLN
10.0000 mL | Freq: Once | INTRAMUSCULAR | Status: AC
  Administered 2024-07-27: 10 mL

## 2024-07-27 MED ORDER — LIDOCAINE HCL (PF) 2 % IJ SOLN
5.0000 mL | Freq: Once | INTRAMUSCULAR | Status: AC
  Administered 2024-07-27: 5 mL

## 2024-07-27 NOTE — Progress Notes (Signed)
 Bilateral L5 dorsal ramus., L4 and  L3 medial branch radio frequency neurotomy under fluoroscopic guidance   Indication: Low back pain due to lumbar spondylosis which has been relieved on 2 occasions by greater than 50% by lumbar medial branch blocks at corresponding levels.  Informed consent was obtained after describing risks and benefits of the procedure with the patient, this includes bleeding, bruising, infection, paralysis and medication side effects. The patient wishes to proceed and has given written consent. The patient was placed in a prone position. The lumbar and sacral area was marked and prepped with Betadine. A 25-gauge 1-1/2 inch needle was inserted into the skin and subcutaneous tissue at 3 sites in one ML of 1% lidocaine  was injected into each site. Then a 18-gauge 15 cm radio frequency needle with a 1 cm curved active tip was inserted targeting the Right S1 SAP/sacral ala junction. Bone contact was made and confirmed with lateral imaging.  motor stimulation at 2 Hz confirm proper needle location followed by injection of 1ml 2% MPF lidocaine . Then the Right L5 SAP/transverse process junction was targeted. Bone contact was made and confirmed with lateral imaging.  motor stimulation at 2 Hz confirm proper needle location followed by injection of 1ml 2% MPF lidocaine . Then the Right L4 SAP/transverse process junction was targeted. Bone contact was made and confirmed with lateral imaging. motor stimulation at 2 Hz confirm proper needle location followed by injection of 1ml 2% MPF lidocaine . Radio frequency lesion being at [facility] for 90 seconds was performed. Needles were removed.  The same procedure was performed on the left side using same needle technique and treatment parameters.  Post procedure instructions and vital signs were performed. Patient tolerated procedure well. Followup appointment was given.

## 2024-07-30 ENCOUNTER — Encounter: Payer: Self-pay | Admitting: Radiology

## 2024-08-08 ENCOUNTER — Encounter: Payer: Self-pay | Admitting: Internal Medicine

## 2024-08-08 ENCOUNTER — Ambulatory Visit (INDEPENDENT_AMBULATORY_CARE_PROVIDER_SITE_OTHER): Admitting: Physician Assistant

## 2024-08-08 ENCOUNTER — Other Ambulatory Visit

## 2024-08-08 ENCOUNTER — Ambulatory Visit (INDEPENDENT_AMBULATORY_CARE_PROVIDER_SITE_OTHER): Payer: Federal, State, Local not specified - PPO | Admitting: Internal Medicine

## 2024-08-08 ENCOUNTER — Encounter (INDEPENDENT_AMBULATORY_CARE_PROVIDER_SITE_OTHER): Payer: Self-pay | Admitting: Physician Assistant

## 2024-08-08 VITALS — BP 123/70 | HR 74 | Temp 98.9°F | Ht 65.0 in | Wt 266.0 lb

## 2024-08-08 VITALS — BP 138/88 | HR 70 | Ht 65.0 in | Wt 269.0 lb

## 2024-08-08 DIAGNOSIS — E559 Vitamin D deficiency, unspecified: Secondary | ICD-10-CM

## 2024-08-08 DIAGNOSIS — E88819 Insulin resistance, unspecified: Secondary | ICD-10-CM

## 2024-08-08 DIAGNOSIS — M47816 Spondylosis without myelopathy or radiculopathy, lumbar region: Secondary | ICD-10-CM | POA: Insufficient documentation

## 2024-08-08 DIAGNOSIS — Z6841 Body Mass Index (BMI) 40.0 and over, adult: Secondary | ICD-10-CM | POA: Diagnosis not present

## 2024-08-08 DIAGNOSIS — E669 Obesity, unspecified: Secondary | ICD-10-CM | POA: Diagnosis not present

## 2024-08-08 DIAGNOSIS — M150 Primary generalized (osteo)arthritis: Secondary | ICD-10-CM | POA: Insufficient documentation

## 2024-08-08 DIAGNOSIS — R7303 Prediabetes: Secondary | ICD-10-CM

## 2024-08-08 DIAGNOSIS — G479 Sleep disorder, unspecified: Secondary | ICD-10-CM | POA: Insufficient documentation

## 2024-08-08 DIAGNOSIS — E039 Hypothyroidism, unspecified: Secondary | ICD-10-CM | POA: Diagnosis not present

## 2024-08-08 DIAGNOSIS — M7989 Other specified soft tissue disorders: Secondary | ICD-10-CM | POA: Insufficient documentation

## 2024-08-08 DIAGNOSIS — M461 Sacroiliitis, not elsewhere classified: Secondary | ICD-10-CM | POA: Insufficient documentation

## 2024-08-08 DIAGNOSIS — D352 Benign neoplasm of pituitary gland: Secondary | ICD-10-CM

## 2024-08-08 MED ORDER — LEVOTHYROXINE SODIUM 50 MCG PO TABS: 50.0000 ug | ORAL_TABLET | Freq: Every day | ORAL | 3 refills | Status: AC

## 2024-08-08 NOTE — Progress Notes (Signed)
 SUBJECTIVE: Discussed the use of AI scribe software for clinical note transcription with the patient, who gave verbal consent to proceed.  Chief Complaint: Obesity  Interim History: She is up 1 lb since her last visit.  Down 3 lbs overall since starting with [facility]  Pamela Esparza is here to discuss her progress with her obesity treatment plan. She is on the Category 2 Plan and states she is following her eating plan approximately 85 % of the time. She states she is not exercising due to fibromyalgia pain.   The patient is a 58 year old with obesity who presents for a follow-up on her obesity treatment plan.  This is her third visit with [facility]. She is on a category two plan and adheres to it approximately 85% of the time. She has gained one pound since her last visit, with a total weight loss of three pounds since starting with [facility] on July 12, 2024.  She consumes more whole foods but acknowledges insufficient protein intake. She feels she is drinking enough water and is not skipping meals. However, she is not sleeping 7 to 9 hours per night and is not currently exercising.  She describes experiencing extreme pain and a rough week, which included emotional stress due to family issues. She has been bedridden due to severe pain, impacting her ability to eat regular meals. Her typical breakfast includes yogurt, and occasionally eggs and bacon with toast. Lunch often consists of a turkey stick or a peanut butter and jelly sandwich with sugar-free jelly. Dinner has been minimal, often just cottage cheese with blueberries due to her pain and her husband's fatigue.  She reports struggling to determine what she can and cannot eat and describes difficulty meeting her protein needs. She has been snacking on items like Oikos yogurt, sugar-free Reese's, sugar-free Hershey's, and a pear, often throughout the day. She is concerned about not meeting her protein needs and  fears going over on calories.  She has previously lost weight using Wegovy , losing 40 pounds, but had to discontinue due to cost. She has tried other medications like Saxenda  without similar success. She expresses concern about her fibroids flaring up, which has been exacerbated by weather changes and emotional stress.  OBJECTIVE: Visit Diagnoses: Problem List Items Addressed This Visit     Insulin  resistance   Vitamin D  deficiency   44.43 Obesity, beginning BMI   BMI 40.0-44.9, adult (HCC)   Other Visit Diagnoses       Prediabetes    -  Primary     Obesity with insulin  resistance, prediabetes Obesity management is ongoing with a focus on dietary modifications. She is on a category two plan and adheres to it approximately 85% of the time. Reports insufficient protein intake and under-eating, contributing to weight gain. Recent stressors, including family health issues, may have impacted dietary habits. Maintained muscle mass despite limited physical activity. Discussed the importance of adequate protein intake to support metabolic function. . Explored potential use of GLP-1 receptor agonists like Zepbound, but insurance coverage is not supported due to prediabetes status. Previous success with Wegovy  noted, but cost and insurance issues limit current options. - Increase protein intake to 30 grams per meal. - Incorporate Fairlife milk to boost protein intake. - Consider using prepared meats from grocery stores for convenience. - Continue hydration and physical activity as tolerated. - Will discuss Thanksgiving dietary strategies at next visit.  Prediabetes/ Insulin  resistance Last A1c was 5.5  Medication(s): Metformin  500 mg once daily breakfast No reported SE.  Polyphagia:No Lab Results  Component Value Date   HGBA1C 5.5 07/12/2024   HGBA1C 5.4 04/16/2024   HGBA1C 5.5 08/18/2023   HGBA1C 5.6 12/20/2022   HGBA1C 5.6 04/21/2022   Lab Results  Component Value Date   INSULIN  26.1  (H) 07/12/2024   Her HOMA-IR is 6.2 which is elevated. Optimal level < 1.9. This is complex condition associated with genetics, ectopic fat and lifestyle factors. Insulin  resistance may result in weight gain, abnormal cravings (particularly for carbs) and fatigue. This may result in additional weight gain and lead to pre-diabetes and diabetes if untreated.   Lab Results  Component Value Date   HGBA1C 5.5 07/12/2024   Lab Results  Component Value Date   INSULIN  26.1 (H) 07/12/2024   Lab Results  Component Value Date   GLUCOSE 96 07/12/2024   GLUCOSE 102 (H) 03/21/2019    We reviewed treatment options which include losing 7 to 10% of body weight, increasing physical activity to a 150 minutes a week of moderate intensity. Plan: Continue Metformin 500 mg once daily breakfast She reports she previously lost 40 lbs on Wegovy , but this is no longer covered by insurance. Continue working on nutrition plan to decrease simple carbohydrates, increase lean proteins and exercise to promote weight loss, improve glycemic control and prevent progression to Type 2 diabetes.   Vitamin D  Deficiency Vitamin D  is not at goal of 50.  Most recent vitamin D  level was 40. She is on OTC vitamin D3 5000 IU daily. No N/V or muscle weakness with OTC vitamin D .  Lab Results  Component Value Date   VD25OH 40.0 07/12/2024   VD25OH 53.6 04/16/2024   VD25OH 65.4 08/18/2023    Plan: Continue OTC vitamin D3 5000 IU daily and follow up levels periodically. Previous levels in goal range on same dose. Will continue this dose and monitor levels to avoid over supplementation.   Low vitamin D  levels can be associated with adiposity and may result in leptin resistance and weight gain. Also associated with fatigue.  Currently on vitamin D  supplementation without any adverse effects such as nausea, vomiting or muscle weakness.    Vitals Temp: 98.9 F (37.2 C) BP: 123/70 Pulse Rate: 74 SpO2: 90 %   Anthropometric  Measurements Height: 5' 5 (1.651 m) Weight: 266 lb (120.7 kg) BMI (Calculated): 44.26 Weight at Last Visit: 265 lb Weight Lost Since Last Visit: 0 Weight Gained Since Last Visit: 1 lb Starting Weight: 269 lb Total Weight Loss (lbs): 3 lb (1.361 kg) Peak Weight: 300 lb   Body Composition  Body Fat %: 51.5 % Fat Mass (lbs): 137.2 lbs Muscle Mass (lbs): 122.6 lbs Total Body Water (lbs): 87.8 lbs Visceral Fat Rating : 18   Other Clinical Data Fasting: No Labs: No Today's Visit #: 3 Starting Date: 07/12/24     ASSESSMENT AND PLAN:  Diet: Lesta is currently in the action stage of change. As such, her goal is to continue with weight loss efforts. She has agreed to Category 2 Plan and keeping a food journal and adhering to recommended goals of 1200 calories and 90 grams of  protein.  Exercise: Mkenzie has been instructed to try a geriatric exercise plan and that some exercise is better than none for weight loss and overall health benefits.   Behavior Modification:  We discussed the following Behavioral Modification Strategies today: increasing lean protein intake, decreasing simple carbohydrates, increasing vegetables, increase H2O intake, increase high  fiber foods, no skipping meals, meal planning and cooking strategies, avoiding temptations, planning for success, and keep a strict food journal. We discussed various medication options to help Marbella with her weight loss efforts and we both agreed to continue current treatment plan.  Return in about 2 weeks (around 08/22/2024).SABRA She was informed of the importance of frequent follow up visits to maximize her success with intensive lifestyle modifications for her multiple health conditions.  Attestation Statements:   Reviewed by clinician on day of visit: allergies, medications, problem list, medical history, surgical history, family history, social history, and previous encounter notes.   Time spent on visit including  pre-visit chart review and post-visit care and charting was 37 minutes.    Koehn Salehi, PA-C

## 2024-08-08 NOTE — Progress Notes (Signed)
 Name: Pamela Esparza  MRN/ DOB: [id number], 01-16-1966    Age/ Sex: 58 y.o., female    PCP: Gayle Saddie JULIANNA DEVONNA   Reason for Endocrinology Evaluation: Pituitary microadenoma     Date of Initial Endocrinology Evaluation: 09/09/2021    HPI: Pamela Esparza is a 58 y.o. female with a past medical history of hypothyroid, Sjogren's syndrome, and HTN. The patient presented for initial endocrinology clinic visit on 09/09/2021 for consultative assistance with her pituitary microadenoma.   During evaluation for hearing loss through ENT the patient was noted to have 6 mm pituitary adenoma on MRI dated 07/02/2021  Hormonal testing was normal including IGF-1, 24-hour urinary cortisol 16.7 mcg, TFTs, prolactin and ACTH  with cortisol (08/2021)   She was diagnosed with hypothyroidism ~2020 . Has been on Lt-4 replacement since then   SUBJECTIVE:    Today (08/08/24): Pamela Esparza is here for follow-up on pituitary microadenoma, and hypothyroidism .  The patient follows with pain management clinic, last glucocortoid injection last weeks, lower back, continues with right lower back  She follows with [facility] rheumatology for fibromyalgia, Jorgen syndrome, and osteoarthritis  Patient follows with healthy weight and wellness clinic, she is not on Wegovy  Weight remains stable Continues with occasional nausea, no vomiting  Continues with  alternating bowel movement  No nipples discharge  No palpitations  Has occasional hand and feet swelling  Headaches have improvised since receiving a cervical spine intra-articular injections  Up to date on eye exams   Levothyroxine  50 mcg daily   HISTORY:  Past Medical History:  Past Medical History:  Diagnosis Date   Allergy    Topiramate, Lyrica , aspirin (sensitive to)   Anemia    Anxiety    Anxiety    Arthritis    At around 50yrs   Asthma    Back pain    Cataract    Cataracts, both eyes    Chest pain    Chronic fatigue syndrome     Chronic pain    Constipation    DDD (degenerative disc disease), lumbar    Depression    Depression    Phreesia 10/13/2020   Depression    Edema of both lower extremities    Esophageal dysmotility    mild to moderate   Family history of bladder cancer    Family history of brain cancer 02/15/2023   Fatty liver    Fatty liver    Fibromyalgia    Gallbladder problem    High blood pressure    Hyperlipidemia    Hypothyroidism    IBS (irritable bowel syndrome)    Infertility, female    Joint pain    Joint pain    Lactose intolerance    Lumbar arthropathy    Mini stroke    Neuromuscular disorder (HCC) 2013   Phreesia 10/13/2020   Neuropathy    Osteoporosis    Prediabetes    Sciatica    Sjogren's disease    SOBOE (shortness of breath on exertion)    SOBOE (shortness of breath on exertion)    Spasm of esophagus    Swallowing difficulty    Thyroid  disease    Vitamin D  deficiency    Past Surgical History:  Past Surgical History:  Procedure Laterality Date   ANKLE SURGERY Right    APPENDECTOMY     CHOLECYSTECTOMY     ESOPHAGOGASTRODUODENOSCOPY (EGD) WITH PROPOFOL  N/A 12/05/2020   Procedure: ESOPHAGOGASTRODUODENOSCOPY (EGD) WITH PROPOFOL ;  Surgeon: Janalyn Keene NOVAK, MD;  Location: [facility] ENDOSCOPY;  Service: Endoscopy;  Laterality: N/A;   finger sugery     NECK SURGERY     SPINE SURGERY     On my neck   TENDON TRANSFER Right 11/06/2019   Procedure: PERONEAL RECONSTRUCTION WITH PERONEAL LONGUS RIGHT LEG;  Surgeon: Harden Jerona GAILS, MD;  Location:  SURGERY CENTER;  Service: Orthopedics;  Laterality: Right;   TOTAL ABDOMINAL HYSTERECTOMY     TUBAL LIGATION N/A    Phreesia 10/13/2020    Social History:  reports that she has never smoked. She has never been exposed to tobacco smoke. She has never used smokeless tobacco. She reports that she does not currently use alcohol. She reports that she does not use drugs. Family History: family history includes ADD / ADHD in  her son; Arthritis in her father and mother; Asthma in her daughter and father; Birth defects in her maternal grandmother; Bladder Cancer (age of onset: 62) in her father; Brain cancer in her paternal grandmother and paternal uncle; Breast cancer (age of onset: 39) in her mother; COPD in her father and mother; Cancer in her father, maternal aunt, maternal grandmother, mother, paternal grandmother, and paternal uncle; Depression in her maternal grandmother; Diabetes in her father; Early death in her paternal uncle; Heart attack in her father; Heart disease in her father; Hypertension in her father; Lung cancer in her maternal aunt; Lung cancer (age of onset: 56) in her mother; Miscarriages / Stillbirths in her daughter; Obesity in her maternal grandmother, paternal grandmother, and paternal uncle; Stroke in her father; Thyroid  disease in her paternal uncle; Uterine cancer in her maternal grandmother; Vision loss in her father.   HOME MEDICATIONS: Allergies as of 08/08/2024       Reactions   Aspirin Other (See Comments)   stomach upset stomach upset   Hydrocodone-acetaminophen  Other (See Comments)   made her sick made her sick   Azithromycin     Patient states it does not work for her   Chlorhexidine     Pt reports possible chlorarpep allergy   Codeine    Other reaction(s): Upset stomach (finding)   Lyrica  [pregabalin ]    Solu-medrol  [methylprednisolone ] Itching   Topiramate    Wasp Venom         Medication List        Accurate as of August 08, 2024  9:25 AM. If you have any questions, ask your nurse or doctor.          albuterol  108 (90 Base) MCG/ACT inhaler Commonly known as: VENTOLIN  HFA INHALE 1 TO 2 PUFFS INTO THE LUNGS EVERY 6 HOURS AS NEEDED   ALPRAZolam  0.5 MG tablet Commonly known as: XANAX  Take 0.5-1 tablets (0.25-0.5 mg total) by mouth daily as needed.   CENTRUM SILVER PO Take by mouth.   co-enzyme Q-10 30 MG capsule Take 30 mg by mouth daily.    cyclobenzaprine 5 MG tablet Commonly known as: FLEXERIL Take 5 mg by mouth 3 (three) times daily as needed for muscle spasms.   DULoxetine 60 MG capsule Commonly known as: CYMBALTA Take 120 mg by mouth daily.   fluticasone  50 MCG/ACT nasal spray Commonly known as: FLONASE  Place 1 spray into both nostrils daily as needed for allergies.   gabapentin  600 MG tablet Commonly known as: NEURONTIN  TAKE 1 TABLET(600 MG) BY MOUTH THREE TIMES DAILY   gabapentin  300 MG capsule Commonly known as: NEURONTIN  Take 1 capsule (300 mg total) by mouth at bedtime.   hydroxychloroquine 200 MG tablet Commonly known  as: PLAQUENIL Take 200 mg by mouth 2 (two) times daily.   levothyroxine  50 MCG tablet Commonly known as: SYNTHROID  Take 1 tablet (50 mcg total) by mouth daily before breakfast.   meloxicam  15 MG tablet Commonly known as: MOBIC  Take 1 tablet (15 mg total) by mouth daily as needed for pain.   metFORMIN 500 MG tablet Commonly known as: GLUCOPHAGE Take 1 tablet (500 mg total) by mouth daily with breakfast.   nitroGLYCERIN  0.4 MG SL tablet Commonly known as: NITROSTAT  Place 1 tablet (0.4 mg total) under the tongue every 5 (five) minutes as needed for chest pain.   nystatin  powder Commonly known as: MYCOSTATIN /NYSTOP  Apply topically 2 (two) times daily.   oxyCODONE -acetaminophen  10-325 MG tablet Commonly known as: PERCOCET Take 1 tablet by mouth 2 (two) times daily as needed for pain.   pilocarpine 5 MG tablet Commonly known as: SALAGEN Take 5 mg by mouth 3 (three) times daily.   PROBIOTIC PO Take 1 tablet by mouth at bedtime.   traZODone 50 MG tablet Commonly known as: DESYREL Take 1 tablet (50 mg total) by mouth at bedtime.   verapamil 120 MG CR tablet Commonly known as: CALAN-SR Take 120 mg by mouth daily.   Vitamin D3 75 MCG (3000 UT) Tabs Take 5,000 Int'l Units by mouth daily.   Wegovy  0.5 MG/0.5ML Soaj SQ injection Generic drug: semaglutide -weight  management Inject 0.5 mg into the skin once a week.          REVIEW OF SYSTEMS: A comprehensive ROS was conducted with the patient and is negative except as per HPI     OBJECTIVE:  VS: BP 138/88   Pulse 70   Ht 5' 5 (1.651 m)   Wt 269 lb (122 kg)   SpO2 98%   BMI 44.76 kg/m    Wt Readings from Last 3 Encounters:  08/08/24 269 lb (122 kg)  07/27/24 269 lb (122 kg)  07/26/24 265 lb (120.2 kg)     EXAM: General: Pt appears well and is in NAD  Neck: General: Supple without adenopathy. Thyroid : No goiter or nodules appreciated.   Lungs: Clear with good BS bilat   Heart: Auscultation: RRR.  Extremities:  BL LE: No pretibial edema normal ROM and strength.  Mental Status: Judgment, insight: Intact Orientation: Oriented to time, place, and person Mood and affect: No depression, anxiety, or agitation     DATA REVIEWED:  Latest Reference Range & Units 07/12/24 10:30  Sodium 134 - 144 mmol/L 140  Potassium 3.5 - 5.2 mmol/L 4.4  Chloride 96 - 106 mmol/L 100  CO2 20 - 29 mmol/L 22  Glucose 70 - 99 mg/dL 96  BUN 6 - 24 mg/dL 12  Creatinine 9.42 - 8.99 mg/dL 9.17  Calcium  8.7 - 10.2 mg/dL 9.8  BUN/Creatinine Ratio 9 - 23  15  eGFR >59 mL/min/1.73 82  Phosphorus 3.0 - 4.3 mg/dL 4.0  Alkaline Phosphatase 49 - 135 IU/L 94  Albumin 3.8 - 4.9 g/dL 4.3  AST 0 - 40 IU/L 25  ALT 0 - 32 IU/L 21  Total Protein 6.0 - 8.5 g/dL 7.1  Total Bilirubin 0.0 - 1.2 mg/dL 0.3    Latest Reference Range & Units 08/08/24 09:48  FSH mIU/mL 31.7  Prolactin ng/mL 10.8     Latest Reference Range & Units 07/12/24 10:30  Sodium 134 - 144 mmol/L 140  Potassium 3.5 - 5.2 mmol/L 4.4  Chloride 96 - 106 mmol/L 100  CO2 20 - 29 mmol/L 22  Glucose 70 - 99 mg/dL 96  BUN 6 - 24 mg/dL 12  Creatinine 9.42 - 8.99 mg/dL 9.17  Calcium  8.7 - 10.2 mg/dL 9.8  BUN/Creatinine Ratio 9 - 23  15  eGFR >59 mL/min/1.73 82  Phosphorus 3.0 - 4.3 mg/dL 4.0  Alkaline Phosphatase 49 - 135 IU/L 94  Albumin 3.8  - 4.9 g/dL 4.3  AST 0 - 40 IU/L 25  ALT 0 - 32 IU/L 21  Total Protein 6.0 - 8.5 g/dL 7.1  Total Bilirubin 0.0 - 1.2 mg/dL 0.3     MRI 87/77/7976  FINDINGS: Brain: No acute infarct, hemorrhage, or parenchymal mass lesion is present. No significant white matter lesions are present. Deep brain nuclei are within normal limits.   The ventricles are of normal size. No significant extraaxial fluid collection is present.   The internal auditory canals are within normal limits. The brainstem and cerebellum are within normal limits.   Dedicated imaging of the pituitary demonstrates that the gland is mildly enlarged measuring 8 mm in cephalo caudad dimension. The pituitary stalk is midline. A hypoenhancing lesion in the left lateral aspect of the gland measures 6.5 x 5.0 x 4.0 mm. There is no invasion of the cavernous sinus. The gland is asymmetrically taller on the left.   Vascular: Flow is present in the major intracranial arteries.   Skull and upper cervical spine: The craniocervical junction is normal. Upper cervical spine is within normal limits. Marrow signal is unremarkable.   Sinuses/Orbits: The paranasal sinuses and mastoid air cells are clear. The globes and orbits are within normal limits.   IMPRESSION: 1. 6.5 x 5.0 x 4.0 mm hypoenhancing lesion in the left lateral aspect of the gland compatible with a pituitary microadenoma. 2. Otherwise normal MRI appearance of the brain.       MRI 07/02/2021  FINDINGS: Brain:   Cerebral volume is normal.   Subtle ill-defined T2 FLAIR hyperintense signal abnormality anterior right frontal lobe white matter (series 6, image 17).   6 mm round hypoenhancing focus within the left aspect of the pituitary gland, highly suspicious for a pituitary microadenoma (series 14, image 24).   No cerebellopontine angle or internal auditory canal mass is demonstrated. Unremarkable appearance of the seventh and eighth cranial nerves  bilaterally.   There is no acute infarct.   No chronic intracranial blood products.   No extra-axial fluid collection.   No midline shift.   No pathologic intracranial enhancement identified.   Vascular: Maintained flow voids within the proximal large arterial vessels.   Skull and upper cervical spine: No focal suspicious marrow lesion.   Sinuses/Orbits: Visualized orbits show no acute finding. Trace mucosal thickening within the bilateral ethmoid sinuses.   Other: Minimal fluid in the right mastoid air cells.   Impression #2 will be called to the ordering clinician or representative by the Radiologist Assistant, and communication documented in the PACS or [employer].   IMPRESSION: No cerebellopontine angle or internal auditory canal mass.   6 mm round hypoenhancing focus within the left aspect of the pituitary gland, highly suspicious for a pituitary microadenoma. Correlate with relevant endocrinologic laboratory values. Additionally, a contrast-enhanced pituitary protocol brain MRI may be considered for further evaluation.   Subtle ill-defined T2 FLAIR hyperintense signal abnormality within the anterior right frontal lobe white matter, nonspecific but possibly reflecting minimal changes of chronic small vessel ischemia.   Trace fluid within the right mastoid air cells.    ASSESSMENT/PLAN/RECOMMENDATIONS:   Pituitary microadenoma:  -No local  symptoms -Pituitary MRI 08/2022 shows stability of pituitary microadenoma, will proceed again with repeat pituitary imaging for this year -This has been nonsecretory - We are again unable to proceed with cortisol testing as she continues to receive glucocorticoid injections on average every 3 months - Labs today show normal prolactin, appropriate FSH for age  63. Hypothyroidism    -Patient is clinically euthyroid - Most recent TFTs were within normal range on 07/12/2024  Medication Continue levothyroxine  50 MCG  daily     Follow-up in 1 yr    Signed electronically by: Stefano Redgie Butts, MD  [facility] Endocrinology  [facility] [address] [place], [place] [address] Phone: [phone] FAX: [phone]   CC: Gayle Saddie FALCON, PA-C [address] [place] [address] Phone: [phone] Fax: [phone]   Return to Endocrinology clinic as below: Future Appointments  Date Time Provider Department Center  08/08/2024  9:30 AM Gerald Honea, Donell Redgie, MD [facility] None  08/08/2024  2:00 PM Rayburn, Elouise Phlegm, PA-C [facility] None  08/22/2024 12:00 PM Rayburn, Elouise Phlegm, PA-C [facility] None  09/05/2024 12:30 PM Rayburn, Elouise Phlegm, PA-C [facility] None  09/18/2024 11:00 AM Rayburn, Elouise Phlegm, PA-C [facility] None  09/18/2024  1:00 PM Debby Fidela CROME, NP [facility] CPR  10/03/2024  2:00 PM Verdon Louann BIRCH, MD [facility] None  10/16/2024 11:20 AM Lorilee, Sven SQUIBB, MD [facility] CPR  11/12/2024  8:30 AM [facility]  11/19/2024  8:50 AM Gayle Saddie FALCON DEVONNA [facility]  01/16/2025 10:00 AM [facility] [id number]

## 2024-08-09 ENCOUNTER — Ambulatory Visit: Payer: Self-pay | Admitting: Internal Medicine

## 2024-08-13 LAB — INSULIN-LIKE GROWTH FACTOR
IGF-I, LC/MS: 102 ng/mL (ref 50–317)
Z-Score (Female): -0.5 {STDV} (ref ?–2.0)

## 2024-08-13 LAB — FOLLICLE STIMULATING HORMONE: FSH: 31.7 m[IU]/mL

## 2024-08-13 LAB — PROLACTIN: Prolactin: 10.8 ng/mL

## 2024-08-20 ENCOUNTER — Other Ambulatory Visit: Payer: Self-pay | Admitting: Physical Medicine and Rehabilitation

## 2024-08-20 ENCOUNTER — Telehealth: Payer: Self-pay | Admitting: *Deleted

## 2024-08-20 ENCOUNTER — Other Ambulatory Visit (HOSPITAL_COMMUNITY): Payer: Self-pay

## 2024-08-20 DIAGNOSIS — G894 Chronic pain syndrome: Secondary | ICD-10-CM

## 2024-08-20 DIAGNOSIS — M47816 Spondylosis without myelopathy or radiculopathy, lumbar region: Secondary | ICD-10-CM

## 2024-08-20 DIAGNOSIS — G8929 Other chronic pain: Secondary | ICD-10-CM

## 2024-08-20 DIAGNOSIS — M47819 Spondylosis without myelopathy or radiculopathy, site unspecified: Secondary | ICD-10-CM

## 2024-08-20 MED ORDER — OXYCODONE-ACETAMINOPHEN 10-325 MG PO TABS: 1.0000 | ORAL_TABLET | Freq: Two times a day (BID) | ORAL | 0 refills | Status: DC | PRN

## 2024-08-20 NOTE — Telephone Encounter (Signed)
 Ms Males called for a refill on her percocet. Last fill per PMP 07/20/24.

## 2024-08-21 NOTE — Progress Notes (Unsigned)
 SUBJECTIVE: Discussed the use of AI scribe software for clinical note transcription with the patient, who gave verbal consent to proceed.  Chief Complaint: Obesity  Interim History: She has maintained her weight since her last visit.  Down 3 lbs overall.   Pamela Esparza is here to discuss her progress with her obesity treatment plan. She is on the Category 2 Plan and states she is following her eating plan approximately 100 % of the time. She states she is not exercising due to severe pain issues.   Pamela Esparza is a 58 year old female who presents for follow-up of her obesity treatment plan.  She experiences severe pain that significantly limits her physical activity, making it difficult to exercise and even walk. She reports that she has not lost any weight since the last visit despite adhering to her dietary plan.  She is on metformin  for insulin  resistance and prediabetes. She previously tried trazodone  but discontinued it due to ineffectiveness. She has also used Wegovy  (semaglutide ) in the past, which was effective for weight loss, resulting in a 40-pound loss. However, she stopped due to cost issues when the price increased.  Her dietary plan focuses on protein intake, aiming for more than 90 grams of protein daily. She avoids high-calorie foods like pizza and opts for healthier options such as cottage cheese with unsweetened pineapple. She consumes around 1000 to 1300 calories daily, focusing on maintaining a calorie deficit.  She experiences sleep disturbances, often due to pain, and has not undergone a sleep study for obstructive sleep apnea. She reports getting only a couple of hours of sleep at night due to discomfort.  She has a family history of obesity and thyroid  issues, with her uncle having died from a thyroid  condition. She also reports a history of constipation, which worsens with a high-protein diet but improves with occasional high-fat meals like McDonald's.  She has tried  pool therapy in the past, which provided short-term relief but was limited by her severe pain. She is unable to perform regular gym exercises due to her condition.   OBJECTIVE: Visit Diagnoses: Problem List Items Addressed This Visit     Chronic pain syndrome   Insulin  resistance   44.43 Obesity, beginning BMI   Relevant Medications   semaglutide -weight management (WEGOVY ) 0.25 MG/0.5ML SOAJ SQ injection   BMI 40.0-44.9, adult (HCC)   Relevant Medications   semaglutide -weight management (WEGOVY ) 0.25 MG/0.5ML SOAJ SQ injection   Other Visit Diagnoses       Prediabetes    -  Primary   Relevant Medications   semaglutide -weight management (WEGOVY ) 0.25 MG/0.5ML SOAJ SQ injection     Chronic constipation         Obesity with insulin  resistance and prediabetes Obesity with insulin  resistance and prediabetes. Reports severe pain limiting physical activity. Previous use of Wegovy  was effective but discontinued due to cost. Discussed GLP-1 receptor agonists, including Wegovy  and Zepbound, with Wegovy  being more affordable. Emphasized maintaining muscle mass and avoiding excessive fat intake to prevent nausea. Discussed potential for weight loss comparable to bariatric surgery results with GLP-1 receptor agonists. - Prescribed Wegovy  0.25 mg for one month, then will consider increasing to 0.5 mg if tolerated. - Advised to use savings card for Wegovy  to reduce cost. - Instructed to maintain hydration and avoid excessive fatty foods. - Encouraged small, frequent meals to manage satiety and maintain muscle mass. - Scheduled follow-up appointment on December 10th, 2025.  Prediabetes Last A1c was 5.5- at goal. Insulin  26.1-  not at goal.   Medication(s): Metformin  500 mg once daily breakfast Polyphagia:No  Lab Results  Component Value Date   HGBA1C 5.5 07/12/2024   Lab Results  Component Value Date   INSULIN  26.1 (H) 07/12/2024   Lab Results  Component Value Date   GLUCOSE 96  07/12/2024   GLUCOSE 102 (H) 03/21/2019   Her HOMA-IR is 6.2 which is elevated. Optimal level < 1.9. This is complex condition associated with genetics, ectopic fat and lifestyle factors. Insulin  resistance may result in weight gain, abnormal cravings (particularly for carbs) and fatigue. This may result in additional weight gain and lead to pre-diabetes and diabetes if untreated.   We reviewed treatment options which include losing 7 to 10% of body weight, increasing physical activity to a 150 minutes a week of moderate intensity.She may also be a candidate for pharmacoprophylaxis with incretin mimetic.  She has had good success with Wegovy  in the past and would like to resume.  We have reviewed the risks and benefits of using a GLP-1. The patient denies a personal or family history of medullary thyroid  cancer or MENII. The patient denies a history of pancreatitis. The potential risks and benefits of this GLP-1 were reviewed with the patient, and alternative treatment options were discussed. All questions were answered, and the patient wishes to move forward with this medication.  Plan: Start Wegovy  0.25 mg SQ weekly Continue metformin  500 mg daily Continue working on nutrition plan to decrease simple carbohydrates, increase lean proteins and exercise to promote weight loss, improve glycemic control and prevent progression to Type 2 diabetes.    Constipation Chronic constipation, possibly exacerbated by dietary habits and medication use. Discussed potential for Wegovy  to slow gut motility, contributing to constipation. - Recommended Miralax, half capful once daily, to manage constipation. - Advised to monitor constipation symptoms and adjust Miralax dosage as needed. - Discussed potential dietary adjustments to alleviate constipation. - Maintain good hydration- at least 80-100 oz of fluids daily.   Chronic pain She experiences severe pain that significantly limits her physical activity,  making it difficult to exercise and even walk. She reports that she has not lost any weight since the last visit despite adhering to her dietary plan. She has tried pool therapy in the past, which provided short-term relief but was limited by her severe pain. She is unable to perform regular gym exercises due to her condition. Plan: She will continue to work on her nutrition plan to promote healthy weight loss which should help off load painful joints and hopefully benefit her chronic pain issues.   Vitals Temp: 98.1 F (36.7 C) BP: 117/71 Pulse Rate: 75 SpO2: 93 %   Anthropometric Measurements Height: 5' 5 (1.651 m) Weight: 266 lb (120.7 kg) BMI (Calculated): 44.26 Weight at Last Visit: 266 lb Weight Lost Since Last Visit: 0 Weight Gained Since Last Visit: 0 Starting Weight: 269 lb Total Weight Loss (lbs): 3 lb (1.361 kg) Peak Weight: 300 lb   Body Composition  Body Fat %: 51.7 % Fat Mass (lbs): 137.6 lbs Muscle Mass (lbs): 122.2 lbs Total Body Water (lbs): 86.6 lbs Visceral Fat Rating : 18   Other Clinical Data Fasting: No Labs: No Today's Visit #: 4 Starting Date: 07/12/24     ASSESSMENT AND PLAN:  Diet: Pamela Esparza is currently in the action stage of change. As such, her goal is to continue with weight loss efforts. She has agreed to Category 2 Plan.  Exercise: Pamela Esparza has been instructed to try a  geriatric exercise plan and that some exercise is better than none for weight loss and overall health benefits.   Behavior Modification:  We discussed the following Behavioral Modification Strategies today: increasing lean protein intake, decreasing simple carbohydrates, increasing vegetables, increase H2O intake, increase high fiber foods, no skipping meals, meal planning and cooking strategies, holiday eating strategies, avoiding temptations, and planning for success. We discussed various medication options to help Pamela Esparza with her weight loss efforts and we both agreed  to start Wegovy  0.25 mg weekly for medical weight loss and will also help to address prediabetes and insulin  resistance.  Return in about 2 weeks (around 09/05/2024).Pamela Esparza She was informed of the importance of frequent follow up visits to maximize her success with intensive lifestyle modifications for her multiple health conditions.  Attestation Statements:   Reviewed by clinician on day of visit: allergies, medications, problem list, medical history, surgical history, family history, social history, and previous encounter notes.   Time spent on visit including pre-visit chart review and post-visit care and charting was 33 minutes.    Osten Janek, PA-C

## 2024-08-22 ENCOUNTER — Ambulatory Visit (INDEPENDENT_AMBULATORY_CARE_PROVIDER_SITE_OTHER): Admitting: Physician Assistant

## 2024-08-22 ENCOUNTER — Encounter (INDEPENDENT_AMBULATORY_CARE_PROVIDER_SITE_OTHER): Payer: Self-pay | Admitting: Physician Assistant

## 2024-08-22 ENCOUNTER — Other Ambulatory Visit (HOSPITAL_COMMUNITY): Payer: Self-pay

## 2024-08-22 VITALS — BP 117/71 | HR 75 | Temp 98.1°F | Ht 65.0 in | Wt 266.0 lb

## 2024-08-22 DIAGNOSIS — K5900 Constipation, unspecified: Secondary | ICD-10-CM

## 2024-08-22 DIAGNOSIS — R7303 Prediabetes: Secondary | ICD-10-CM

## 2024-08-22 DIAGNOSIS — E782 Mixed hyperlipidemia: Secondary | ICD-10-CM

## 2024-08-22 DIAGNOSIS — G894 Chronic pain syndrome: Secondary | ICD-10-CM

## 2024-08-22 DIAGNOSIS — E88819 Insulin resistance, unspecified: Secondary | ICD-10-CM

## 2024-08-22 DIAGNOSIS — Z6841 Body Mass Index (BMI) 40.0 and over, adult: Secondary | ICD-10-CM

## 2024-08-22 DIAGNOSIS — K5909 Other constipation: Secondary | ICD-10-CM

## 2024-08-22 DIAGNOSIS — E669 Obesity, unspecified: Secondary | ICD-10-CM

## 2024-08-22 DIAGNOSIS — E559 Vitamin D deficiency, unspecified: Secondary | ICD-10-CM

## 2024-08-22 MED ORDER — WEGOVY 0.25 MG/0.5ML ~~LOC~~ SOAJ: 0.2500 mg | SUBCUTANEOUS | 0 refills | Status: AC

## 2024-08-27 ENCOUNTER — Other Ambulatory Visit (HOSPITAL_COMMUNITY): Payer: Self-pay

## 2024-08-28 ENCOUNTER — Other Ambulatory Visit: Payer: Self-pay

## 2024-08-28 DIAGNOSIS — G629 Polyneuropathy, unspecified: Secondary | ICD-10-CM

## 2024-09-05 ENCOUNTER — Ambulatory Visit (INDEPENDENT_AMBULATORY_CARE_PROVIDER_SITE_OTHER): Admitting: Physician Assistant

## 2024-09-06 ENCOUNTER — Other Ambulatory Visit: Payer: Self-pay

## 2024-09-06 ENCOUNTER — Encounter (HOSPITAL_COMMUNITY): Payer: Self-pay

## 2024-09-06 ENCOUNTER — Other Ambulatory Visit (HOSPITAL_COMMUNITY): Payer: Self-pay

## 2024-09-06 MED ORDER — VERAPAMIL HCL ER 120 MG PO TBCR
120.0000 mg | EXTENDED_RELEASE_TABLET | Freq: Every day | ORAL | 1 refills | Status: AC
  Filled 2024-09-06: qty 5, 5d supply, fill #0
  Filled 2024-09-06: qty 85, 85d supply, fill #0

## 2024-09-15 ENCOUNTER — Other Ambulatory Visit

## 2024-09-15 DIAGNOSIS — D352 Benign neoplasm of pituitary gland: Secondary | ICD-10-CM

## 2024-09-15 MED ORDER — GADOPICLENOL 0.5 MMOL/ML IV SOLN
10.0000 mL | Freq: Once | INTRAVENOUS | Status: AC | PRN
  Administered 2024-09-15: 10 mL via INTRAVENOUS

## 2024-09-17 NOTE — Progress Notes (Unsigned)
 "  SUBJECTIVE: Discussed the use of AI scribe software for clinical note transcription with the patient, who gave verbal consent to proceed.  Chief Complaint: Obesity  Interim History: She is down 1 lb since her last visit.   Pamela Esparza is here to discuss her progress with her obesity treatment plan. She is on the Category 2 Plan and states she is following her eating plan approximately 100 % of the time. She states she is not exercising  due to pain issues.   Pamela Esparza is a 58 year old female with obesity who presents for follow-up of her obesity treatment plan.  She has recently started Wegovy , a once-weekly injection, and has taken her second shot. She has lost one pound, attributing limited progress to holiday eating and constipation. Prior to the holidays, she was losing four pounds a week.  She has a history of prediabetes with insulin  resistance, mixed hyperlipidemia, vitamin D  deficiency, chronic constipation, and chronic pain issues. She experiences chronic constipation, which she attributes to her diet and medications. She does not currently take any medication for constipation but has used high-fat foods like Big Macs to relieve symptoms.  She experiences chronic pain, which remains constant. Increased stress due to family health issues, including her father's terminal illness and her stepmother's hospitalization, has impacted her sleep, leading her to use a sleep aid to improve rest.  No gastrointestinal issues beyond constipation, such as nausea or changes in vision. She feels depressed, a condition that predates her current treatment plan. She has been more emotional and agitated recently, attributing this to the stress of the holiday season and family concerns.  Her social history includes increased eating out during the holidays, which she believes has affected her weight loss progress. She plans to de-stress by returning to crocheting and is looking forward to a cruise in  January, which she hopes will provide a much-needed break.   OBJECTIVE: Visit Diagnoses: Problem List Items Addressed This Visit     Insulin  resistance   Relevant Medications   semaglutide -weight management (WEGOVY ) 0.5 MG/0.5ML SOAJ SQ injection (Start on 10/02/2024)   Vitamin D  deficiency   44.43 Obesity, beginning BMI   Relevant Medications   semaglutide -weight management (WEGOVY ) 0.5 MG/0.5ML SOAJ SQ injection (Start on 10/02/2024)   BMI 40.0-44.9, adult (HCC)   Relevant Medications   semaglutide -weight management (WEGOVY ) 0.5 MG/0.5ML SOAJ SQ injection (Start on 10/02/2024)   Other Visit Diagnoses       Prediabetes    -  Primary   Relevant Medications   semaglutide -weight management (WEGOVY ) 0.5 MG/0.5ML SOAJ SQ injection (Start on 10/02/2024)     Chronic constipation          Obesity Currently on Wegovy  0.25 mg weekly for weight management, started two weeks ago. Reports difficulty losing weight during the holidays due to increased eating out and stress. No nausea or other gastrointestinal issues reported. Discussed potential anti-inflammatory effects of Wegovy , which may benefit chronic pain and arthritis. Denies mass in neck, dysphagia, dyspepsia, persistent hoarseness, abdominal pain, or N/V/Constipation or diarrhea. Has annual eye exam. Mood is stable.  - Continue Cat 2 plan - Continue Wegovy  0.25 mg  once weekly x 2 more doses and then increase to 0.5 mg weekly and monitor response. - Encouraged dietary modifications with increased fiber intake.  Chronic constipation May have been exacerbated slightly by Wegovy . No current use of laxatives.  Reports normally would eat a very fatty food and this would resolve the constipation very quickly.  Discussed dietary fiber supplementation and gradual increase to avoid bloating and gas. - Start psyllium fiber capsules, one capsule twice daily with at least 12 ounces of water. - Increase fiber intake gradually to avoid bloating and  gas. - Consider milk of magnesia or Senna if needed for stronger relief.   Prediabetes/ Insulin  resistance Last A1c was 5.5- at goal. Insulin  26.1- significantly elevated/not at goal.   Medication(s): Wegovy  0.25 mg SQ weekly and Metformin  500 mg once daily breakfast Denies mass in neck, dysphagia, dyspepsia, persistent hoarseness, abdominal pain, or N/V or diarrhea. Has annual eye exam. Mood is stable. Chronic constipation-? Slightly worse on Wegovy .  No GI upset on metformin .   Polyphagia:No Lab Results  Component Value Date   HGBA1C 5.5 07/12/2024   HGBA1C 5.4 04/16/2024   HGBA1C 5.5 08/18/2023   HGBA1C 5.6 12/20/2022   HGBA1C 5.6 04/21/2022   Lab Results  Component Value Date   INSULIN  26.1 (H) 07/12/2024  HOMA-IR is 6.2 which is elevated. Optimal level < 1.9. This is complex condition associated with genetics, ectopic fat and lifestyle factors. Insulin  resistance may result in weight gain, abnormal cravings (particularly for carbs) and fatigue. This may result in additional weight gain and lead to pre-diabetes and diabetes if untreated.  We reviewed treatment options which include losing 7 to 10% of body weight, increasing physical activity to a 150 minutes a week of moderate intensity.She may also be a candidate for pharmacoprophylaxis with incretin mimetic.  She has had good success with Wegovy  in the past and has resumed without side effect x 2 doses thus far.    Plan: Continue Wegovy  0.25 mg SQ weekly and Metformin  500 mg once daily breakfast Plan to increase Wegovy  to 0.5 mg weekly after completes loading dose interval of Wegovy  0.25 mg.  Continue working on nutrition plan to decrease simple carbohydrates, increase lean proteins and exercise to promote weight loss, improve glycemic control and prevent progression to Type 2 diabetes.   Vitals Temp: 98.4 F (36.9 C) BP: (!) 157/89   Anthropometric Measurements Height: 5' 5 (1.651 m) Weight: 265 lb (120.2 kg) BMI  (Calculated): 44.1 Weight at Last Visit: 266 lb Weight Lost Since Last Visit: 1 lb Weight Gained Since Last Visit: 0 Starting Weight: 269 lb Total Weight Loss (lbs): 4 lb (1.814 kg) Peak Weight: 300 lb   Body Composition  Body Fat %: 51.7 % Fat Mass (lbs): 137.4 lbs Muscle Mass (lbs): 121.8 lbs Total Body Water (lbs): 86.2 lbs Visceral Fat Rating : 18   Other Clinical Data Fasting: No Labs: No Today's Visit #: 5 Starting Date: 07/12/24     ASSESSMENT AND PLAN:  Diet: Pamela Esparza is currently in the action stage of change. As such, her goal is to continue with weight loss efforts. She has agreed to Category 2 Plan.  Exercise: Pamela Esparza has been instructed to try a geriatric exercise plan and that some exercise is better than none for weight loss and overall health benefits.   Behavior Modification:  We discussed the following Behavioral Modification Strategies today: increasing lean protein intake, decreasing simple carbohydrates, increasing vegetables, increase H2O intake, increase high fiber foods, no skipping meals, meal planning and cooking strategies, holiday eating strategies, avoiding temptations, and planning for success. We discussed various medication options to help Pamela Esparza with her weight loss efforts and we both agreed to continue Wegovy  0.25 mg for 2 more doses, then increase to 0.5 mg weekly and monitor.  Return in about 2 weeks (around 10/02/2024).Pamela Esparza She  was informed of the importance of frequent follow up visits to maximize her success with intensive lifestyle modifications for her multiple health conditions.  Attestation Statements:   Reviewed by clinician on day of visit: allergies, medications, problem list, medical history, surgical history, family history, social history, and previous encounter notes.   Time spent on visit including pre-visit chart review and post-visit care and charting was 34 minutes.    Erick Oxendine, PA-C  "

## 2024-09-18 ENCOUNTER — Encounter (INDEPENDENT_AMBULATORY_CARE_PROVIDER_SITE_OTHER): Payer: Self-pay | Admitting: Physician Assistant

## 2024-09-18 ENCOUNTER — Encounter: Attending: Physical Medicine and Rehabilitation | Admitting: Registered Nurse

## 2024-09-18 ENCOUNTER — Ambulatory Visit (INDEPENDENT_AMBULATORY_CARE_PROVIDER_SITE_OTHER): Admitting: Physician Assistant

## 2024-09-18 ENCOUNTER — Telehealth (INDEPENDENT_AMBULATORY_CARE_PROVIDER_SITE_OTHER): Payer: Self-pay

## 2024-09-18 VITALS — BP 157/89 | Temp 98.4°F | Ht 65.0 in | Wt 265.0 lb

## 2024-09-18 VITALS — BP 116/78 | HR 69 | Ht 65.0 in | Wt 270.0 lb

## 2024-09-18 DIAGNOSIS — Z79891 Long term (current) use of opiate analgesic: Secondary | ICD-10-CM | POA: Diagnosis present

## 2024-09-18 DIAGNOSIS — G894 Chronic pain syndrome: Secondary | ICD-10-CM | POA: Diagnosis present

## 2024-09-18 DIAGNOSIS — M47816 Spondylosis without myelopathy or radiculopathy, lumbar region: Secondary | ICD-10-CM | POA: Insufficient documentation

## 2024-09-18 DIAGNOSIS — K5909 Other constipation: Secondary | ICD-10-CM

## 2024-09-18 DIAGNOSIS — Z5181 Encounter for therapeutic drug level monitoring: Secondary | ICD-10-CM | POA: Insufficient documentation

## 2024-09-18 DIAGNOSIS — M2550 Pain in unspecified joint: Secondary | ICD-10-CM | POA: Insufficient documentation

## 2024-09-18 DIAGNOSIS — R7303 Prediabetes: Secondary | ICD-10-CM | POA: Diagnosis not present

## 2024-09-18 DIAGNOSIS — Z6841 Body Mass Index (BMI) 40.0 and over, adult: Secondary | ICD-10-CM | POA: Diagnosis not present

## 2024-09-18 DIAGNOSIS — E782 Mixed hyperlipidemia: Secondary | ICD-10-CM

## 2024-09-18 DIAGNOSIS — E669 Obesity, unspecified: Secondary | ICD-10-CM

## 2024-09-18 DIAGNOSIS — M546 Pain in thoracic spine: Secondary | ICD-10-CM | POA: Diagnosis present

## 2024-09-18 DIAGNOSIS — G8929 Other chronic pain: Secondary | ICD-10-CM | POA: Diagnosis present

## 2024-09-18 DIAGNOSIS — E559 Vitamin D deficiency, unspecified: Secondary | ICD-10-CM | POA: Diagnosis not present

## 2024-09-18 DIAGNOSIS — E88819 Insulin resistance, unspecified: Secondary | ICD-10-CM

## 2024-09-18 DIAGNOSIS — M47819 Spondylosis without myelopathy or radiculopathy, site unspecified: Secondary | ICD-10-CM | POA: Diagnosis present

## 2024-09-18 MED ORDER — WEGOVY 0.5 MG/0.5ML ~~LOC~~ SOAJ: 0.5000 mg | SUBCUTANEOUS | 1 refills | Status: AC

## 2024-09-18 MED ORDER — OXYCODONE-ACETAMINOPHEN 10-325 MG PO TABS: 1.0000 | ORAL_TABLET | Freq: Two times a day (BID) | ORAL | 0 refills | Status: DC | PRN

## 2024-09-18 NOTE — Telephone Encounter (Signed)
 PA started for [facility]

## 2024-09-18 NOTE — Progress Notes (Signed)
 "  Subjective:    Patient ID: Pamela Esparza, female    DOB: [birth year], 58 y.o.   MRN: [id number]  HPI: Pamela Esparza is a 58 y.o. female who returns for follow up appointment for chronic pain and medication refill. She states her pain is located in her mid- lower back  and generalized joint pain. She rates her pain 7. Her current exercise regime is walking short distances  and performing stretching exercises.  [name] Morphine equivalent is  30.00 MME.   Oral Swab was Performed today.     Pain Inventory Average Pain 6 Pain Right Now 7 My pain is sharp, burning, dull, stabbing, tingling, and aching  In the last 24 hours, has pain interfered with the following? General activity 1 Relation with others 10 Enjoyment of life 9 What TIME of day is your pain at its worst? morning , daytime, evening, and night Sleep (in general) Fair  Pain is worse with: walking, bending, sitting, inactivity, standing, and some activites Pain improves with: rest, heat/ice, medication, and injections Relief from Meds: 8  Family History  Problem Relation Age of Onset   Breast cancer Mother 60       second cancer at 62   Lung cancer Mother 20   Arthritis Mother    Cancer Mother    COPD Mother    Heart attack Father    Hypertension Father    Diabetes Father    Bladder Cancer Father 44   Arthritis Father    Asthma Father    Cancer Father    COPD Father    Heart disease Father    Stroke Father    Vision loss Father    Lung cancer Maternal Aunt    Cancer Maternal Aunt    Brain cancer Paternal Uncle    Thyroid  disease Paternal Uncle    Birth defects Maternal Grandmother        ovarian   Depression Maternal Grandmother    Uterine cancer Maternal Grandmother        d. 57s   Cancer Maternal Grandmother    Obesity Maternal Grandmother    Brain cancer Paternal Grandmother    Cancer Paternal Grandmother    Obesity Paternal Grandmother    ADD / ADHD Son    Cancer Paternal Uncle    Early  death Paternal Uncle    Obesity Paternal Uncle    Asthma Daughter    Miscarriages / Stillbirths Daughter    Colon cancer Neg Hx    Colon polyps Neg Hx    Esophageal cancer Neg Hx    Stomach cancer Neg Hx    Rectal cancer Neg Hx    Social History   Socioeconomic History   Marital status: Married    Spouse name: Hiawatha Dressel   Number of children: Not on file   Years of education: Not on file   Highest education level: Associate degree: occupational, scientist, product/process development, or vocational program  Occupational History   Occupation: Retired, disabled  Tobacco Use   Smoking status: Never    Passive exposure: Never   Smokeless tobacco: Never   Tobacco comments:    Never smoked  Vaping Use   Vaping status: Never Used  Substance and Sexual Activity   Alcohol use: Not Currently    Comment: 4 drinks a year-rare   Drug use: Never   Sexual activity: Not Currently    Comment: Total hysterectomy  Other Topics Concern   Not on file  Social History Narrative  Not on file   Social Drivers of Health   Tobacco Use: Low Risk (09/18/2024)   Patient History    Smoking Tobacco Use: Never    Smokeless Tobacco Use: Never    Passive Exposure: Never  Financial Resource Strain: Low Risk (04/23/2024)   Overall Financial Resource Strain (CARDIA)    Difficulty of Paying Living Expenses: Not very hard  Food Insecurity: No Food Insecurity (04/23/2024)   Epic    Worried About Radiation Protection Practitioner of Food in the Last Year: Never true    Ran Out of Food in the Last Year: Never true  Transportation Needs: No Transportation Needs (04/23/2024)   Epic    Lack of Transportation (Medical): No    Lack of Transportation (Non-Medical): No  Physical Activity: Inactive (04/23/2024)   Exercise Vital Sign    Days of Exercise per Week: 0 days    Minutes of Exercise per Session: Not on file  Stress: No Stress Concern Present (04/23/2024)   [employer] of Occupational Health - Occupational Stress Questionnaire    Feeling of  Stress: Only a little  Social Connections: Moderately Isolated (04/23/2024)   Social Connection and Isolation Panel    Frequency of Communication with Friends and Family: More than three times a week    Frequency of Social Gatherings with Friends and Family: Patient declined    Attends Religious Services: Patient declined    Active Member of Clubs or Organizations: No    Attends Engineer, Structural: Not on file    Marital Status: Married  Depression (PHQ2-9): Low Risk (07/27/2024)   Depression (PHQ2-9)    PHQ-2 Score: 2  Recent Concern: Depression (PHQ2-9) - Medium Risk (07/20/2024)   Depression (PHQ2-9)    PHQ-2 Score: 6  Alcohol Screen: Low Risk (04/23/2024)   Alcohol Screen    Last Alcohol Screening Score (AUDIT): 1  Housing: Low Risk (04/23/2024)   Epic    Unable to Pay for Housing in the Last Year: No    Number of Times Moved in the Last Year: 0    Homeless in the Last Year: No  Utilities: Not At Risk (12/27/2022)   [facility] Utilities    Threatened with loss of utilities: No  Health Literacy: Not on file   Past Surgical History:  Procedure Laterality Date   ANKLE SURGERY Right    APPENDECTOMY     CHOLECYSTECTOMY     ESOPHAGOGASTRODUODENOSCOPY (EGD) WITH PROPOFOL  N/A 12/05/2020   Procedure: ESOPHAGOGASTRODUODENOSCOPY (EGD) WITH PROPOFOL ;  Surgeon: Janalyn Keene NOVAK, MD;  Location: [facility];  Service: Endoscopy;  Laterality: N/A;   finger sugery     NECK SURGERY     SPINE SURGERY     On my neck   TENDON TRANSFER Right 11/06/2019   Procedure: PERONEAL RECONSTRUCTION WITH PERONEAL LONGUS RIGHT LEG;  Surgeon: Harden Jerona GAILS, MD;  Location:  SURGERY CENTER;  Service: Orthopedics;  Laterality: Right;   TOTAL ABDOMINAL HYSTERECTOMY     TUBAL LIGATION N/A    [employer]reesia 10/13/2020   Past Surgical History:  Procedure Laterality Date   ANKLE SURGERY Right    APPENDECTOMY     CHOLECYSTECTOMY     ESOPHAGOGASTRODUODENOSCOPY (EGD) WITH PROPOFOL  N/A 12/05/2020    Procedure: ESOPHAGOGASTRODUODENOSCOPY (EGD) WITH PROPOFOL ;  Surgeon: Janalyn Keene NOVAK, MD;  Location: [facility];  Service: Endoscopy;  Laterality: N/A;   finger sugery     NECK SURGERY     SPINE SURGERY     On my neck   TENDON TRANSFER  Right 11/06/2019   Procedure: PERONEAL RECONSTRUCTION WITH PERONEAL LONGUS RIGHT LEG;  Surgeon: Harden Jerona GAILS, MD;  Location: [facility];  Service: Orthopedics;  Laterality: Right;   TOTAL ABDOMINAL HYSTERECTOMY     TUBAL LIGATION N/A    [employer]rees[employer] 10/13/2020   Past Medical History:  Diagnosis Date   Allergy    Topiramate, Lyrica , aspirin (sensitive to)   Anemia    Anxiety    Anxiety    Arthritis    At around 52yrs   Asthma    Back pain    Cataract    Cataracts, both eyes    Chest pain    Chronic fatigue syndrome    Chronic pain    Constipation    DDD (degenerative disc disease), lumbar    Depression    Depression    Phreesia 10/13/2020   Depression    Edema of both lower extremities    Esophageal dysmotility    mild to moderate   Family history of bladder cancer    Family history of brain cancer 02/15/2023   Fatty liver    Fatty liver    Fibromyalgia    Gallbladder problem    High blood pressure    Hyperlipidemia    Hypothyroidism    IBS (irritable bowel syndrome)    Infertility, female    Joint pain    Joint pain    Lactose intolerance    Lumbar arthropathy    Mini stroke    Neuromuscular disorder (HCC) 2013   Phreesia 10/13/2020   Neuropathy    Osteoporosis    Prediabetes    Sciatica    Sjogren's disease    SOBOE (shortness of breath on exertion)    SOBOE (shortness of breath on exertion)    Spasm of esophagus    Swallowing difficulty    Thyroid  disease    Vitamin D  deficiency    BP 116/78   Pulse 69   Ht 5' 5 (1.651 m)   Wt 270 lb (122.5 kg)   SpO2 97%   BMI 44.93 kg/m   Opioid Risk Score:   Fall Risk Score:  `1  Depression screen [facility] 2/9     07/27/2024   12:22 PM 07/20/2024    11:43 AM 07/12/2024    7:32 AM 04/27/2024   10:32 AM 03/06/2024    1:12 PM 01/27/2024   10:06 AM 12/16/2023    8:53 AM  Depression screen PHQ 2/9  Decreased Interest 1 3 3 2  0 0 0  Down, Depressed, Hopeless 1 3 0 0 0 0 0  PHQ - 2 Score 2 6 3 2  0 0 0  Altered sleeping   3 2 0    Tired, decreased energy   3 1 0    Change in appetite   1 0 0    Feeling bad or failure about yourself    0 0 0    Trouble concentrating   1 1 0    Moving slowly or fidgety/restless   0 0 0    Suicidal thoughts   0 0 0    PHQ-9 Score   11  6  0     Difficult doing work/chores   Somewhat difficult Somewhat difficult        Data saved with a previous flowsheet row definition     Review of Systems  Musculoskeletal:  Positive for myalgias.  All other systems reviewed and are negative.      Objective:  Physical Exam Vitals and nursing note reviewed.  Constitutional:      Appearance: Normal appearance.  Cardiovascular:     Rate and Rhythm: Normal rate and regular rhythm.     Pulses: Normal pulses.     Heart sounds: Normal heart sounds.  Pulmonary:     Effort: Pulmonary effort is normal.     Breath sounds: Normal breath sounds.  Musculoskeletal:     Comments: Normal Muscle Bulk and Muscle Testing Reveals:  Upper Extremities: Full ROM and Muscle Strength 5/5 Bilateral AC Joint Tenderness  Thoracic, and  Lumbar Hypersensitivity Bilateral Greater Trochanter Tenderness Lower Extremities: Full ROM and Muscle Strength 5/5 Bilateral Lower Extremities Flexion Produces Pain into her Bilateral Patella's  Arises from Table slowly uses cane for support Antalgic  Gait     Skin:    General: Skin is warm and dry.  Neurological:     Mental Status: She is alert and oriented to person, place, and time.  Psychiatric:        Mood and Affect: Mood normal.        Behavior: Behavior normal.          Assessment & Plan:  Cervicalgia/ Cervical Radiculitis: No complaints today. Continue HEP as Tolerated. Continue  Gabapentin . Continue to Monitor. 09/18/2024 Chronic Bilateral Thoracic Back Pain: Continue HEP as Tolerated. Continue to Monitor. 09/18/2024 Spondylosis Lumbar/ Right Lumbar Radiculitis: S/P Bilateral L5 dorsal ramus., L4 and  L3 medial branch radio frequency neurotomy under fluoroscopic guidance on 07/27/2024, she reports no relief. Continue Gabapentin . Continue to Monitor. 09/18/2024 Right Greater Trochanter Bursitis: No complaints today. Continue HEP as Tolerated. Continue to Monitor. 09/18/2024 Chronic Pain Syndrome: Refilled: Oxycodone  10/325 mg one tablet twice a day as needed for pain #60. Oral Swab was performed today, Continue to Monitor. 09/18/2024 6. Polyarthralgia: Continue HEP as Tolerated, Continue to monitor.   F/U 1 month          "

## 2024-09-21 ENCOUNTER — Other Ambulatory Visit: Payer: Self-pay

## 2024-09-22 LAB — DRUG TOX MONITOR 1 W/CONF, ORAL FLD
Amphetamines: NEGATIVE ng/mL
Barbiturates: NEGATIVE ng/mL
Benzodiazepines: NEGATIVE ng/mL
Buprenorphine: NEGATIVE ng/mL
Cocaine: NEGATIVE ng/mL
Codeine: NEGATIVE ng/mL
Dihydrocodeine: NEGATIVE ng/mL
Fentanyl: NEGATIVE ng/mL
Heroin Metabolite: NEGATIVE ng/mL
Hydrocodone: NEGATIVE ng/mL
Hydromorphone: NEGATIVE ng/mL
MARIJUANA: NEGATIVE ng/mL
MDMA: NEGATIVE ng/mL
Meprobamate: NEGATIVE ng/mL
Methadone: NEGATIVE ng/mL
Morphine: NEGATIVE ng/mL
Nicotine Metabolite: NEGATIVE ng/mL
Norhydrocodone: NEGATIVE ng/mL
Noroxycodone: 11.2 ng/mL — ABNORMAL HIGH
Opiates: POSITIVE ng/mL — AB
Oxycodone: 95.1 ng/mL — ABNORMAL HIGH
Oxymorphone: NEGATIVE ng/mL
Phencyclidine: NEGATIVE ng/mL
Tapentadol: NEGATIVE ng/mL
Tramadol: NEGATIVE ng/mL
Zolpidem: NEGATIVE ng/mL

## 2024-09-22 LAB — DRUG TOX ALC METAB W/CON, ORAL FLD: Alcohol Metabolite: NEGATIVE ng/mL

## 2024-09-26 NOTE — Telephone Encounter (Signed)
 PA for Wegovy  0.5 has been approved. PA is now complete.     Your prior authorization for Wegovy  has been approved! More Info Personalized support and financial assistance may be available through the [employer] program. For more information, and to see program requirements, click on the More Info button to the right.  Message from plan: Your PA request has been approved. Additional information will be provided in the approval communication. (Message 1145). Authorization Expiration Date: March 17, 2025.

## 2024-10-03 ENCOUNTER — Ambulatory Visit (INDEPENDENT_AMBULATORY_CARE_PROVIDER_SITE_OTHER): Admitting: Family Medicine

## 2024-10-04 ENCOUNTER — Encounter (INDEPENDENT_AMBULATORY_CARE_PROVIDER_SITE_OTHER): Payer: Self-pay | Admitting: Family Medicine

## 2024-10-04 ENCOUNTER — Ambulatory Visit (INDEPENDENT_AMBULATORY_CARE_PROVIDER_SITE_OTHER): Admitting: Family Medicine

## 2024-10-04 VITALS — BP 135/84 | HR 65 | Temp 97.6°F | Ht 65.0 in | Wt 261.0 lb

## 2024-10-04 DIAGNOSIS — E669 Obesity, unspecified: Secondary | ICD-10-CM | POA: Diagnosis not present

## 2024-10-04 DIAGNOSIS — Z6841 Body Mass Index (BMI) 40.0 and over, adult: Secondary | ICD-10-CM | POA: Diagnosis not present

## 2024-10-04 DIAGNOSIS — R7303 Prediabetes: Secondary | ICD-10-CM

## 2024-10-04 NOTE — Progress Notes (Unsigned)
 "  SUBJECTIVE:  Chief Complaint: Obesity  Interim History: Patient has been trying to stay focused on food intake choice and quantity over the last few weeks.  She has cut back on her red meat consumption.  She is trying to stay mindful of her food choices.  She has not been able to get all her protein in.  Wegovy  has limited how much she can get in but that also means her nutrition.  Breakfast and lunch are small and dinner is largest meal.  Eating crackers in the am- [place].  Lunch is cottage cheese with blueberries or pineapple.  May have a yasso bar for snack.  Dinner has been cottage cheese with blueberries or a piece of pizza with fruit.  Occasionally she may have chicken with rice and vegetable.    Pamela Esparza is here to discuss her progress with her obesity treatment plan. She is on the Category 2 Plan and states she is following her eating plan approximately 60-70 % of the time. She states she is exercising 10 minutes 4-5 times per week.   OBJECTIVE: Visit Diagnoses: Problem List Items Addressed This Visit       Other   44.43 Obesity, beginning BMI   BMI 40.0-44.9, adult (HCC)   Other Visit Diagnoses       Prediabetes    -  Primary       Vitals Temp: 97.6 F (36.4 C) BP: 135/84 Pulse Rate: 65 SpO2: 99 %   Anthropometric Measurements Height: 5' 5 (1.651 m) Weight: 261 lb (118.4 kg) BMI (Calculated): 43.43 Weight at Last Visit: 265 lb Weight Lost Since Last Visit: 4 Weight Gained Since Last Visit: 0 Starting Weight: 269 lb Total Weight Loss (lbs): 8 lb (3.629 kg)   Body Composition  Body Fat %: 50.8 % Fat Mass (lbs): 132.8 lbs Muscle Mass (lbs): 122.4 lbs Total Body Water (lbs): 83.6 lbs Visceral Fat Rating : 17   Other Clinical Data Today's Visit #: 6 Starting Date: 07/12/24 Comments: Cat 2     ASSESSMENT AND PLAN: Assessment & Plan Prediabetes Patient is working on limiting simple carbohydrates and focusing on protein consumption.  This has been  hard over the holidays.  Still working on ensuring adequate protein intake.  Follow-up at next appointment as to whether or not patient has been able to be more consistently on meal plan. Obesity, beginning BMI 44.43  BMI 40.0-44.9, adult (HCC) Current BMI 44.2    Diet: [name] is currently in the action stage of change. As such, her goal is to continue with weight loss efforts and has agreed to keeping a food journal and adhering to recommended goals of 1100 calories and 80 or more grams of protein daily. Patient to start food log or journaling meal plan.  The initial goal will be to habitually log or journal for at least 4 days a week.  The expectation it that patient may not initially meet calorie or protein goals as the nutritional understanding of food intake is begun.  We discussed the 10:1 ratio when reading a food label.  Patient agrees to keep a food log either electronically or on paper and bring to the next appointment to be able to dissect and discuss it with provider.     Exercise:  All adults should avoid inactivity. Some activity is better than none, and adults who participate in any amount of physical activity, gain some health benefits.  Behavior Modification:  We discussed the following Behavioral Modification Strategies today: increasing  lean protein intake, no skipping meals, meal planning and cooking strategies, keeping healthy foods in the home, and keep a strict food journal. We discussed various medication options to help [place] with her weight loss efforts and we both agreed to try to increase wegovy  for 1 month to see how she is able to tolerate then revisit dose.  Return in about 3 weeks (around 10/25/2024).   She was informed of the importance of frequent follow up visits to maximize her success with intensive lifestyle modifications for her multiple health conditions.  Attestation Statements:   Reviewed by clinician on day of visit: allergies, medications, problem  list, medical history, surgical history, family history, social history, and previous encounter notes.   Adelita Cho, MD "

## 2024-10-15 ENCOUNTER — Telehealth: Payer: Self-pay

## 2024-10-15 DIAGNOSIS — M542 Cervicalgia: Secondary | ICD-10-CM

## 2024-10-15 DIAGNOSIS — M5416 Radiculopathy, lumbar region: Secondary | ICD-10-CM

## 2024-10-15 MED ORDER — QUTENZA (4 PATCH) 8 % EX KIT: 4.0000 | PACK | Freq: Once | CUTANEOUS | 4 refills | Status: AC

## 2024-10-15 NOTE — Telephone Encounter (Signed)
 Rx sent to [facility] and PA has been submitted.

## 2024-10-16 ENCOUNTER — Encounter: Attending: Physical Medicine and Rehabilitation | Admitting: Physical Medicine and Rehabilitation

## 2024-10-16 VITALS — BP 122/73 | HR 74 | Ht 65.0 in | Wt 267.0 lb

## 2024-10-16 DIAGNOSIS — G4701 Insomnia due to medical condition: Secondary | ICD-10-CM | POA: Diagnosis not present

## 2024-10-16 DIAGNOSIS — K5900 Constipation, unspecified: Secondary | ICD-10-CM | POA: Insufficient documentation

## 2024-10-16 DIAGNOSIS — G894 Chronic pain syndrome: Secondary | ICD-10-CM | POA: Diagnosis not present

## 2024-10-16 DIAGNOSIS — G8929 Other chronic pain: Secondary | ICD-10-CM | POA: Insufficient documentation

## 2024-10-16 DIAGNOSIS — M797 Fibromyalgia: Secondary | ICD-10-CM | POA: Insufficient documentation

## 2024-10-16 DIAGNOSIS — M47819 Spondylosis without myelopathy or radiculopathy, site unspecified: Secondary | ICD-10-CM | POA: Diagnosis not present

## 2024-10-16 DIAGNOSIS — M546 Pain in thoracic spine: Secondary | ICD-10-CM | POA: Insufficient documentation

## 2024-10-16 DIAGNOSIS — M47816 Spondylosis without myelopathy or radiculopathy, lumbar region: Secondary | ICD-10-CM | POA: Diagnosis not present

## 2024-10-16 MED ORDER — OXYCODONE-ACETAMINOPHEN 10-325 MG PO TABS: 1.0000 | ORAL_TABLET | Freq: Two times a day (BID) | ORAL | 0 refills | Status: AC | PRN

## 2024-10-16 NOTE — Progress Notes (Signed)
 "  Subjective:    Patient ID: Pamela Esparza, female    DOB: Mar 17, 1966, 59 y.o.   MRN: [id number]  HPI Pamela Esparza is a 59 year old woman who presents to establish care for leg pain  1) Right >left leg pain -she has been emotional and in pain- she has been emotional due to the pain -she now has a new pain after steroid injections- she felt this pain during her injection as well -discussed that she was very pleased with her prior pain clinic and following with Dr. Fernand but he is now in [place]- she feels he was excellent and had an excellent bedside manner. She followed at [facility] in the past -Qutenza  was denied -radiates from the back -has leg swelling  -she has been diagnosed with sciatica and herniated disc -she gets steroid injections in [facility] and these help with the back pain -this has happened since 2012 -doing PT -she has a tens unit at home  2) Insomnia: -she takes flexeril and this helps her sleep -she was on amitriptyline  3) Nausea: -started with LDN and stopped after she stopped the medicine  4) Cervical spine pain: -not sure if Dr. Bonner has called her -she has pain in both shoulders -she is interested in cervical spine injections -radiates into her arms -she has had prior cervical spine surgery -when she had surgery she was dropping objects -she has tried postural corrections but has muscle spasm She is interested in cervical ESI  5) Low back pain: -she used to get ablations that were helpful for her  6) Fibromyalgia: -she is on cymbalta 60mg  daily -she cannot afford LDN -she did aquatherapy and it helped a little bit but its effects wore off -she cannot to do land therapy because she is in a lot pain afterward  Pain Inventory Average Pain 7 Pain Right Now 7 My pain is constant, sharp, burning, dull, stabbing, tingling, and aching  In the last 24 hours, has pain interfered with the following? General activity 8 Relation with others  10 Enjoyment of life 7 What TIME of day is your pain at its worst? morning , daytime, evening, night, and varies Sleep (in general) Poor  Pain is worse with: walking, bending, sitting, inactivity, standing, and some activites Pain improves with: rest, heat/ice, medication, and injections Relief from Meds: 9      Family History  Problem Relation Age of Onset   Breast cancer Mother 32       second cancer at 82   Lung cancer Mother 56   Arthritis Mother    Cancer Mother    COPD Mother    Heart attack Father    Hypertension Father    Diabetes Father    Bladder Cancer Father 36   Arthritis Father    Asthma Father    Cancer Father    COPD Father    Heart disease Father    Stroke Father    Vision loss Father    Lung cancer Maternal Aunt    Cancer Maternal Aunt    Brain cancer Paternal Uncle    Thyroid  disease Paternal Uncle    Birth defects Maternal Grandmother        ovarian   Depression Maternal Grandmother    Uterine cancer Maternal Grandmother        d. 67s   Cancer Maternal Grandmother    Obesity Maternal Grandmother    Brain cancer Paternal Grandmother    Cancer Paternal Grandmother  Obesity Paternal Grandmother    ADD / ADHD Son    Cancer Paternal Uncle    Early death Paternal Uncle    Obesity Paternal Uncle    Asthma Daughter    Miscarriages / Stillbirths Daughter    Colon cancer Neg Hx    Colon polyps Neg Hx    Esophageal cancer Neg Hx    Stomach cancer Neg Hx    Rectal cancer Neg Hx    Social History   Socioeconomic History   Marital status: Married    Spouse name: Aila Terra   Number of children: Not on file   Years of education: Not on file   Highest education level: Associate degree: occupational, scientist, product/process development, or vocational program  Occupational History   Occupation: Retired, disabled  Tobacco Use   Smoking status: Never    Passive exposure: Never   Smokeless tobacco: Never   Tobacco comments:    Never smoked  Vaping Use   Vaping  status: Never Used  Substance and Sexual Activity   Alcohol use: Not Currently    Comment: 4 drinks a year-rare   Drug use: Never   Sexual activity: Not Currently    Comment: Total hysterectomy  Other Topics Concern   Not on file  Social History Narrative   Not on file   Social Drivers of Health   Tobacco Use: Low Risk (10/04/2024)   Patient History    Smoking Tobacco Use: Never    Smokeless Tobacco Use: Never    Passive Exposure: Never  Financial Resource Strain: Low Risk (04/23/2024)   Overall Financial Resource Strain (CARDIA)    Difficulty of Paying Living Expenses: Not very hard  Food Insecurity: No Food Insecurity (04/23/2024)   Epic    Worried About Radiation Protection Practitioner of Food in the Last Year: Never true    Ran Out of Food in the Last Year: Never true  Transportation Needs: No Transportation Needs (04/23/2024)   Epic    Lack of Transportation (Medical): No    Lack of Transportation (Non-Medical): No  Physical Activity: Inactive (04/23/2024)   Exercise Vital Sign    Days of Exercise per Week: 0 days    Minutes of Exercise per Session: Not on file  Stress: No Stress Concern Present (04/23/2024)   [employer] of Occupational Health - Occupational Stress Questionnaire    Feeling of Stress: Only a little  Social Connections: Moderately Isolated (04/23/2024)   Social Connection and Isolation Panel    Frequency of Communication with Friends and Family: More than three times a week    Frequency of Social Gatherings with Friends and Family: Patient declined    Attends Religious Services: Patient declined    Active Member of Clubs or Organizations: No    Attends Engineer, Structural: Not on file    Marital Status: Married  Depression (PHQ2-9): Low Risk (07/27/2024)   Depression (PHQ2-9)    PHQ-2 Score: 2  Recent Concern: Depression (PHQ2-9) - Medium Risk (07/20/2024)   Depression (PHQ2-9)    PHQ-2 Score: 6  Alcohol Screen: Low Risk (04/23/2024)   Alcohol Screen     Last Alcohol Screening Score (AUDIT): 1  Housing: Low Risk (04/23/2024)   Epic    Unable to Pay for Housing in the Last Year: No    Number of Times Moved in the Last Year: 0    Homeless in the Last Year: No  Utilities: Not At Risk (12/27/2022)   [facility] Utilities    Threatened with loss of  utilities: No  Health Literacy: Not on file   Past Surgical History:  Procedure Laterality Date   ANKLE SURGERY Right    APPENDECTOMY     CHOLECYSTECTOMY     ESOPHAGOGASTRODUODENOSCOPY (EGD) WITH PROPOFOL  N/A 12/05/2020   Procedure: ESOPHAGOGASTRODUODENOSCOPY (EGD) WITH PROPOFOL ;  Surgeon: Janalyn Keene NOVAK, MD;  Location: [facility];  Service: Endoscopy;  Laterality: N/A;   finger sugery     NECK SURGERY     SPINE SURGERY     On my neck   TENDON TRANSFER Right 11/06/2019   Procedure: PERONEAL RECONSTRUCTION WITH PERONEAL LONGUS RIGHT LEG;  Surgeon: Harden Jerona GAILS, MD;  Location: [facility];  Service: Orthopedics;  Laterality: Right;   TOTAL ABDOMINAL HYSTERECTOMY     TUBAL LIGATION N/A    Phreesia 10/13/2020   Past Medical History:  Diagnosis Date   Allergy    Topiramate, Lyrica , aspirin (sensitive to)   Anemia    Anxiety    Anxiety    Arthritis    At around 78yrs   Asthma    Back pain    Cataract    Cataracts, both eyes    Chest pain    Chronic fatigue syndrome    Chronic pain    Constipation    DDD (degenerative disc disease), lumbar    Depression    Depression    Phreesia 10/13/2020   Depression    Edema of both lower extremities    Esophageal dysmotility    mild to moderate   Family history of bladder cancer    Family history of brain cancer 02/15/2023   Fatty liver    Fatty liver    Fibromyalgia    Gallbladder problem    High blood pressure    Hyperlipidemia    Hypothyroidism    IBS (irritable bowel syndrome)    Infertility, female    Joint pain    Joint pain    Lactose intolerance    Lumbar arthropathy    Mini stroke    Neuromuscular  disorder (HCC) 2013   Phreesia 10/13/2020   Neuropathy    Osteoporosis    Prediabetes    Sciatica    Sjogren's disease    SOBOE (shortness of breath on exertion)    SOBOE (shortness of breath on exertion)    Spasm of esophagus    Swallowing difficulty    Thyroid  disease    Vitamin D  deficiency    BP 122/73   Pulse 74   Ht 5' 5 (1.651 m)   Wt 267 lb (121.1 kg)   SpO2 99%   BMI 44.43 kg/m   Opioid Risk Score:   Fall Risk Score:  `1  Depression screen [facility] 2/9     07/27/2024   12:22 PM 07/20/2024   11:43 AM 07/12/2024    7:32 AM 04/27/2024   10:32 AM 03/06/2024    1:12 PM 01/27/2024   10:06 AM 12/16/2023    8:53 AM  Depression screen PHQ 2/9  Decreased Interest 1 3 3 2  0 0 0  Down, Depressed, Hopeless 1 3 0 0 0 0 0  PHQ - 2 Score 2 6 3 2  0 0 0  Altered sleeping   3 2 0    Tired, decreased energy   3 1 0    Change in appetite   1 0 0    Feeling bad or failure about yourself    0 0 0    Trouble concentrating   1 1 0  Moving slowly or fidgety/restless   0 0 0    Suicidal thoughts   0 0 0    PHQ-9 Score   11  6  0     Difficult doing work/chores   Somewhat difficult Somewhat difficult        Data saved with a previous flowsheet row definition    Review of Systems  Gastrointestinal:  Positive for constipation.  Genitourinary:  Positive for frequency.  Musculoskeletal:  Positive for gait problem.       Pain all over the body  Neurological:  Positive for weakness and numbness.  Psychiatric/Behavioral:  Positive for confusion.        Depression, anxiety  All other systems reviewed and are negative.      Objective:   Physical Exam Gen: no distress, normal appearing HEENT: oral mucosa pink and moist, NCAT Cardio: Reg rate Chest: normal effort, normal rate of breathing Abd: soft, non-distended Ext: no edema Psych: pleasant, normal affect Skin: intact Neuro: Alert and oriented x3, 4/5 strength upper extremities, stable, lower extremity strength is 5/5  bilaterally     Assessment & Plan:   1) Chronic Pain Syndrome secondary to leg pain: -discussed butrans  patch- discussed that I have been seeing benefits of this for patient -discussed that aquatherapy only helped minimally -discussed that she has tried a spinal cord stimulator but did not feel benefit from this in the past -discussed vagal nerve stimulation -discussed that regular therapy made the pain worse -discussed that she had EMG/[place] testing and this was told to her to be normal -discussed that she used to be strong -discussed plan for steroid injection with Dr. Carilyn -discussed that she avoids processed foods -Discussed current symptoms of pain and history of pain.  -Discussed benefits of exercise in reducing pain.  -continue voltaren  -discussed mechanism of action of low dose naltrexone as an opioid receptor antagonist which stimulates your body's production of its own natural endogenous opioids, helping to decrease pain. Discussed that it can also decrease T cell response and thus be helpful in decreasing inflammation, and symptoms of brain fog, fatigue, anxiety, depression, and allergies. Discussed that this medication needs to be compounded at a compounding pharmacy and can more expensive. Discussed that I usually start at 1mg  and if this is not providing enough relief then I titrate upward on a monthly basis.    -discussed that Journavx is a highly selective inhibitor for Nav 1.8, which is specific for pain in the peripheral nervous system, discussed that lidocaine  in contrast affects all Nav receptors, discussed that patient can try using this medication as prn for pain, though its studies have focused on its use for acute pain. Discussed that it has been studied against opioids for acute pain with comparable efficacy. Discussed that I have not seen any patient side effects thus far. Discussed that we have samples available and we have copay cards available. Discussed that  outpatient if the medication requires a prior auth the copay should be $30 for at least a 60 day supply. The medication may be more likely to be in stock in [facility] and [facility]. We do have samples available.   -Discussed current symptoms of pain and history of pain.  -Discussed benefits of exercise in reducing pain. -Discussed following foods that may reduce pain: 1) Ginger (especially studied for arthritis)- reduce leukotriene production to decrease inflammation 2) Blueberries- high in phytonutrients that decrease inflammation 3) Salmon- marine omega-3s reduce joint swelling and pain 4) Pumpkin seeds- reduce  inflammation 5) dark chocolate- reduces inflammation 6) turmeric- reduces inflammation 7) tart cherries - reduce pain and stiffness 8) extra virgin olive oil - its compound olecanthal helps to block prostaglandins  9) chili peppers- can be eaten or applied topically via capsaicin  10) mint- helpful for headache, muscle aches, joint pain, and itching 11) garlic- reduces inflammation 12) Green tea- reduces inflammation and oxidative stress, helps with weight loss, may reduce the risk of cancer, recommend Double [employer] of Tea daily  Link to further information on diet for chronic pain: [email or web address].bray.[email or web address]    2) Insomnia: -discussed that trazodone  caused her to be agitated -Try to go outside near sunrise -Get exercise during the day.  -Turn off all devices an hour before bedtime.  -Teas that can benefit: chamomile, valerian root, Brahmi (Bacopa) -Can consider over the counter melatonin, magnesium, and/or L-theanine. Melatonin is an anti-oxidant with multiple health benefits. Magnesium is involved in greater than 300 enzymatic reactions in the body and most of us  are deficient as our soil is often depleted. There are 7 different types of magnesium- Bioptemizer's is a supplement with all 7 types, and each  has unique benefits. Magnesium can also help with constipation and anxiety.  -Pistachios naturally increase the production of melatonin -Cozy Earth bamboo bed sheets are free from toxic chemicals.  -Tart cherry juice or a tart cherry supplement can improve sleep and soreness post-workout    Link to further information on diet for chronic pain: [email or web address].bray.com/    3) Facet arthropathy: -discussed that she has had medial branch blocks -referred to Dr. Carilyn for medial branch blocks  4) Constipation:  -recommended magnesium oxide 200mg  at night -continue metamucil -Provided list of following foods that help with constipation and highlighted a few: 1) prunes- contain high amounts of fiber.  2) apples- has a form of dietary fiber called pectin that accelerates stool movement and increases beneficial gut bacteria 3) pears- in addition to fiber, also high in fructose and sorbitol which have laxative effect 4) figs- contain an enzyme ficin which helps to speed colonic transit 5) kiwis- contain an enzyme actinidin that improves gut motility and reduces constipation 6) oranges- rich in pectin (like apples) 7) grapefruits- contain a flavanol naringenin which has a laxative effect 8) vegetables- rich in fiber and also great sources of folate, vitamin C, and K 9) artichoke- high in inulin, prebiotic great for the microbiome 10) chicory- increases stool frequency and softness (can be added to coffee) 11) rhubarb- laxative effect 12) sweet potato- high fiber 13) beans, peas, and lentils- contain both soluble and insoluble fiber 14) chia seeds- improves intestinal health and gut flora 15) flaxseeds- laxative effect 16) whole grain rye bread- high in fiber 17) oat bran- high in soluble and insoluble fiber 18) kefir- softens stools -recommended to try at least one of these foods every day.  -drink 6-8 glasses of water  per day -walk regularly, especially after meals.   5) Shortness of breath: -discussed that she had a thorough eval and her heart is working well  6) Cervical spine pain:  -discussed her cervical radiculopathy, discussed getting an MRI cervical spine, discussed that she has been receiving 10mg  Percocet from another provider but that they have moved far away and so she would like to schedule with us , discussed that we can probably get her in to see our nurse practitioner Fidela Ned next week and that we would have to do pain contract and urine sample at this appointment  -referred to  Dr. Charlie Dolores for cervical ESI, placed new referral since she has not heard from their office  -discussed that Journavx is a highly selective inhibitor for Nav 1.8, which is specific for pain in the peripheral nervous system, discussed that lidocaine  in contrast affects all Nav receptors, discussed that patient can try using this medication as prn for pain, though its studies have focused on its use for acute pain. Discussed that it has been studied against opioids for acute pain with comparable efficacy. Discussed that I have not seen any patient side effects thus far. Discussed that we have samples available and we have copay cards available. Discussed that outpatient if the medication requires a prior auth the copay should be $30 for at least a 60 day supply. The medication may be more likely to be in stock in [facility] and [facility]. We do have samples available.   -Discussed Qutenza  as an option for neuropathic pain control. Discussed that this is a capsaicin  patch, stronger than capsaicin  cream. Discussed that it is currently approved for diabetic peripheral neuropathy and post-herpetic neuralgia, but that it has also shown benefit in treating other forms of neuropathy. Provided patient with link to site to learn more about the patch: [email or web address] .[email or web address] Discussed that the patch would be placed in office and  benefits usually last 3 months. Discussed that unintended exposure to capsaicin  can cause severe irritation of eyes, mucous membranes, respiratory tract, and skin, but that Qutenza  is a local treatment and does not have the systemic side effects of other nerve medications. Discussed that there may be pain, itching, erythema, and decreased sensory function associated with the application of Qutenza . Side effects usually subside within 1 week. A cold pack of analgesic medications can help with these side effects. Blood pressure can also be increased due to pain associated with administration of the patch.   7) Neuropathy: -continue voltaren  -Discussed Qutenza  as an option for neuropathic pain control. Discussed that this is a capsaicin  patch, stronger than capsaicin  cream. Discussed that it is currently approved for diabetic peripheral neuropathy and post-herpetic neuralgia, but that it has also shown benefit in treating other forms of neuropathy. Provided patient with link to site to learn more about the patch: [email or web address] .[email or web address]/. Discussed that the patch would be placed in office and benefits usually last 3 months. Discussed that unintended exposure to capsaicin  can cause severe irritation of eyes, mucous membranes, respiratory tract, and skin, but that Qutenza  is a local treatment and does not have the systemic side effects of other nerve medications. Discussed that there may be pain, itching, erythema, and decreased sensory function associated with the application of Qutenza . Side effects usually subside within 1 week. A cold pack of analgesic medications can help with these side effects. Blood pressure can also be increased due to pain associated with administration of the patch.  We are recommending Qutenza  8% capsaicin  to treat this patient's pain. Qutenza  is the first-line treatment option recommended for diabetic peripheral neuropathy by the AACE and ADA.   Qutenza  is a safer option for this  patient due to the following: Gabapentin  use has been associated with increased risk of dementia Lyrica  use has been associated with increased risk of heart failure Cymblata, Venlafaxine, and other SSRIs/SNRIs are associated with increased weight gain Lidocaine  5% has been prescribed/tried   -frankincense and myrrh  8) Lumbar radiculopathy:  -discussed avoiding processed foods  -Discussed Qutenza  as an option for neuropathic pain control. Discussed that this is a capsaicin  patch, stronger than capsaicin   cream. Discussed that it is currently approved for diabetic peripheral neuropathy and post-herpetic neuralgia, but that it has also shown benefit in treating other forms of neuropathy. Provided patient with link to site to learn more about the patch: [email or web address] [email or web address] Discussed that the patch would be placed in office and benefits usually last 3 months. Discussed that unintended exposure to capsaicin  can cause severe irritation of eyes, mucous membranes, respiratory tract, and skin, but that Qutenza  is a local treatment and does not have the systemic side effects of other nerve medications. Discussed that there may be pain, itching, erythema, and decreased sensory function associated with the application of Qutenza . Side effects usually subside within 1 week. A cold pack of analgesic medications can help with these side effects. Blood pressure can also be increased due to pain associated with administration of the patch.  We are recommending Qutenza  8% capsaicin  to treat this patient's pain. Qutenza  is the first-line treatment option recommended for diabetic peripheral neuropathy by the AACE and ADA.   Qutenza  is a safer option for this patient due to the following: Gabapentin  use has been associated with increased risk of dementia Lyrica  use has been associated with increased risk of heart failure Cymblata, Venlafaxine, and other SSRIs/SNRIs are associated with increased weight  gain Lidocaine  5% has been prescribed/tried   -referred to Dr. Carilyn for steroid injection  9) Obesity: -metformin  500mg  daily, continue -continue Wegovy  -BMI reviewed and is 44.43 -discussed that she lost 4 lbs and got yelled at for not eating enough protein -discussed that she likes fruits  -recommend avoiding processed foods -continue fruits and vegetables -discussed that movement is limited because of her pain -discussed that heating pad is helpful -discussed that bracing hurts -discussed that she cannot remember if she tried topamax  10) Fibromyalgia: -continue vitamin D  supplement -discussed that she is concerned that she is developing MS     "

## 2024-10-16 NOTE — Patient Instructions (Addendum)
 Butrans   Constipation:  200mg  magnesium HS -Provided list of following foods that help with constipation and highlighted a few: 1) prunes- contain high amounts of fiber.  2) apples- has a form of dietary fiber called pectin that accelerates stool movement and increases beneficial gut bacteria 3) pears- in addition to fiber, also high in fructose and sorbitol which have laxative effect 4) figs- contain an enzyme ficin which helps to speed colonic transit 5) kiwis- contain an enzyme actinidin that improves gut motility and reduces constipation 6) oranges- rich in pectin (like apples) 7) grapefruits- contain a flavanol naringenin which has a laxative effect 8) vegetables- rich in fiber and also great sources of folate, vitamin C, and K 9) artichoke- high in inulin, prebiotic great for the microbiome 10) chicory- increases stool frequency and softness (can be added to coffee) 11) rhubarb- laxative effect 12) sweet potato- high fiber 13) beans, peas, and lentils- contain both soluble and insoluble fiber 14) chia seeds- improves intestinal health and gut flora 15) flaxseeds- laxative effect 16) whole grain rye bread- high in fiber 17) oat bran- high in soluble and insoluble fiber 18) kefir- softens stools -recommended to try at least one of these foods every day.  -drink 6-8 glasses of water per day -walk regularly, especially after meals.

## 2024-10-24 ENCOUNTER — Telehealth: Payer: Self-pay

## 2024-10-24 NOTE — Telephone Encounter (Signed)
(  KeyBETHA PECK) PA Case ID #: [id number] Need Help? Call us  at [phone] Status sent iconSent to Plan today Drug Qutenza  (4 Patch) 8% kit ePA cloud Psychologist, Educational Electronic PA Form [phone] NCPDP

## 2024-11-12 ENCOUNTER — Ambulatory Visit (INDEPENDENT_AMBULATORY_CARE_PROVIDER_SITE_OTHER): Admitting: Physician Assistant

## 2024-11-12 ENCOUNTER — Other Ambulatory Visit

## 2024-11-19 ENCOUNTER — Ambulatory Visit

## 2024-12-03 ENCOUNTER — Encounter: Attending: Physical Medicine and Rehabilitation | Admitting: Physical Medicine and Rehabilitation

## 2024-12-11 ENCOUNTER — Encounter: Admitting: Registered Nurse

## 2025-01-16 ENCOUNTER — Ambulatory Visit (HOSPITAL_BASED_OUTPATIENT_CLINIC_OR_DEPARTMENT_OTHER)

## 2025-08-12 ENCOUNTER — Ambulatory Visit: Admitting: Internal Medicine
